# Patient Record
Sex: Female | Born: 1960
Health system: Southern US, Community
[De-identification: ages and names within clinical notes are randomized; demographics above are authoritative.]

## PROBLEM LIST (undated history)

## (undated) DIAGNOSIS — K219 Gastro-esophageal reflux disease without esophagitis: Secondary | ICD-10-CM

## (undated) DIAGNOSIS — E119 Type 2 diabetes mellitus without complications: Secondary | ICD-10-CM

## (undated) DIAGNOSIS — J45909 Unspecified asthma, uncomplicated: Secondary | ICD-10-CM

## (undated) DIAGNOSIS — G510 Bell's palsy: Secondary | ICD-10-CM

## (undated) DIAGNOSIS — M1990 Unspecified osteoarthritis, unspecified site: Secondary | ICD-10-CM

## (undated) DIAGNOSIS — N200 Calculus of kidney: Secondary | ICD-10-CM

## (undated) DIAGNOSIS — I10 Essential (primary) hypertension: Secondary | ICD-10-CM

## (undated) DIAGNOSIS — Z8719 Personal history of other diseases of the digestive system: Secondary | ICD-10-CM

## (undated) HISTORY — DX: Bell's palsy: G51.0

## (undated) HISTORY — DX: Calculus of kidney: N20.0

## (undated) HISTORY — DX: Essential (primary) hypertension: I10

## (undated) HISTORY — DX: Gastro-esophageal reflux disease without esophagitis: K21.9

---
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
## 1983-05-17 HISTORY — PX: OTHER SURGICAL HISTORY: SHX169

## 1998-11-27 ENCOUNTER — Encounter: Admission: RE | Admit: 1998-11-27 | Discharge: 1999-02-25 | Payer: Self-pay | Admitting: Obstetrics & Gynecology

## 1999-01-24 ENCOUNTER — Inpatient Hospital Stay (HOSPITAL_COMMUNITY): Admission: AD | Admit: 1999-01-24 | Discharge: 1999-01-24 | Payer: Self-pay | Admitting: Obstetrics & Gynecology

## 1999-01-29 ENCOUNTER — Inpatient Hospital Stay (HOSPITAL_COMMUNITY): Admission: AD | Admit: 1999-01-29 | Discharge: 1999-01-31 | Payer: Self-pay | Admitting: Obstetrics and Gynecology

## 1999-02-02 ENCOUNTER — Encounter (HOSPITAL_COMMUNITY): Admission: RE | Admit: 1999-02-02 | Discharge: 1999-05-03 | Payer: Self-pay | Admitting: Obstetrics and Gynecology

## 2000-05-16 HISTORY — PX: OTHER SURGICAL HISTORY: SHX169

## 2001-02-07 ENCOUNTER — Other Ambulatory Visit: Admission: RE | Admit: 2001-02-07 | Discharge: 2001-02-07 | Payer: Self-pay | Admitting: Obstetrics and Gynecology

## 2001-02-07 ENCOUNTER — Encounter (INDEPENDENT_AMBULATORY_CARE_PROVIDER_SITE_OTHER): Payer: Self-pay | Admitting: Specialist

## 2004-02-06 ENCOUNTER — Encounter: Admission: RE | Admit: 2004-02-06 | Discharge: 2004-02-06 | Payer: Self-pay | Admitting: Family Medicine

## 2004-04-20 ENCOUNTER — Ambulatory Visit: Payer: Self-pay | Admitting: Family Medicine

## 2004-06-25 ENCOUNTER — Ambulatory Visit: Payer: Self-pay | Admitting: Family Medicine

## 2004-06-27 ENCOUNTER — Encounter: Admission: RE | Admit: 2004-06-27 | Discharge: 2004-06-27 | Payer: Self-pay | Admitting: Family Medicine

## 2004-10-19 ENCOUNTER — Ambulatory Visit: Payer: Self-pay | Admitting: Family Medicine

## 2005-02-21 ENCOUNTER — Ambulatory Visit: Payer: Self-pay | Admitting: Family Medicine

## 2005-02-28 ENCOUNTER — Ambulatory Visit: Payer: Self-pay | Admitting: Family Medicine

## 2005-03-02 ENCOUNTER — Ambulatory Visit: Payer: Self-pay | Admitting: Internal Medicine

## 2005-03-07 ENCOUNTER — Ambulatory Visit: Payer: Self-pay | Admitting: Family Medicine

## 2005-03-11 ENCOUNTER — Ambulatory Visit: Payer: Self-pay | Admitting: Internal Medicine

## 2005-08-09 ENCOUNTER — Ambulatory Visit: Payer: Self-pay | Admitting: Family Medicine

## 2005-08-22 ENCOUNTER — Ambulatory Visit: Payer: Self-pay

## 2005-08-22 ENCOUNTER — Encounter: Payer: Self-pay | Admitting: Internal Medicine

## 2005-08-23 ENCOUNTER — Ambulatory Visit: Payer: Self-pay | Admitting: Family Medicine

## 2006-05-26 ENCOUNTER — Ambulatory Visit: Payer: Self-pay | Admitting: Family Medicine

## 2007-01-26 ENCOUNTER — Encounter: Payer: Self-pay | Admitting: Family Medicine

## 2007-01-29 ENCOUNTER — Encounter (INDEPENDENT_AMBULATORY_CARE_PROVIDER_SITE_OTHER): Payer: Self-pay | Admitting: *Deleted

## 2007-03-14 ENCOUNTER — Ambulatory Visit: Payer: Self-pay | Admitting: Internal Medicine

## 2007-03-14 DIAGNOSIS — I10 Essential (primary) hypertension: Secondary | ICD-10-CM

## 2007-03-26 ENCOUNTER — Ambulatory Visit: Payer: Self-pay | Admitting: Internal Medicine

## 2007-03-26 ENCOUNTER — Telehealth: Payer: Self-pay | Admitting: Internal Medicine

## 2007-03-26 DIAGNOSIS — H6520 Chronic serous otitis media, unspecified ear: Secondary | ICD-10-CM | POA: Insufficient documentation

## 2007-03-27 ENCOUNTER — Encounter: Payer: Self-pay | Admitting: Internal Medicine

## 2007-03-28 ENCOUNTER — Telehealth (INDEPENDENT_AMBULATORY_CARE_PROVIDER_SITE_OTHER): Payer: Self-pay | Admitting: *Deleted

## 2007-12-20 ENCOUNTER — Ambulatory Visit: Payer: Self-pay | Admitting: *Deleted

## 2007-12-25 ENCOUNTER — Telehealth (INDEPENDENT_AMBULATORY_CARE_PROVIDER_SITE_OTHER): Payer: Self-pay | Admitting: *Deleted

## 2008-01-08 ENCOUNTER — Ambulatory Visit (HOSPITAL_BASED_OUTPATIENT_CLINIC_OR_DEPARTMENT_OTHER): Admission: RE | Admit: 2008-01-08 | Discharge: 2008-01-08 | Payer: Self-pay | Admitting: *Deleted

## 2008-01-08 ENCOUNTER — Ambulatory Visit: Payer: Self-pay | Admitting: *Deleted

## 2008-01-30 ENCOUNTER — Ambulatory Visit: Payer: Self-pay | Admitting: *Deleted

## 2008-02-06 ENCOUNTER — Ambulatory Visit: Payer: Self-pay | Admitting: *Deleted

## 2008-03-24 ENCOUNTER — Ambulatory Visit: Payer: Self-pay | Admitting: *Deleted

## 2008-03-24 DIAGNOSIS — R498 Other voice and resonance disorders: Secondary | ICD-10-CM | POA: Insufficient documentation

## 2008-03-24 LAB — CONVERTED CEMR LAB: Rapid Strep: NEGATIVE

## 2008-03-27 ENCOUNTER — Telehealth (INDEPENDENT_AMBULATORY_CARE_PROVIDER_SITE_OTHER): Payer: Self-pay | Admitting: *Deleted

## 2008-04-11 ENCOUNTER — Encounter: Admission: RE | Admit: 2008-04-11 | Discharge: 2008-04-11 | Payer: Self-pay | Admitting: Otolaryngology

## 2008-06-11 ENCOUNTER — Telehealth (INDEPENDENT_AMBULATORY_CARE_PROVIDER_SITE_OTHER): Payer: Self-pay | Admitting: *Deleted

## 2008-09-08 ENCOUNTER — Encounter: Admission: RE | Admit: 2008-09-08 | Discharge: 2008-09-08 | Payer: Self-pay | Admitting: Otolaryngology

## 2008-10-09 ENCOUNTER — Ambulatory Visit: Payer: Self-pay | Admitting: Family Medicine

## 2008-10-09 DIAGNOSIS — M719 Bursopathy, unspecified: Secondary | ICD-10-CM

## 2008-10-09 DIAGNOSIS — M67919 Unspecified disorder of synovium and tendon, unspecified shoulder: Secondary | ICD-10-CM | POA: Insufficient documentation

## 2008-10-09 DIAGNOSIS — L851 Acquired keratosis [keratoderma] palmaris et plantaris: Secondary | ICD-10-CM

## 2008-10-28 ENCOUNTER — Ambulatory Visit (HOSPITAL_BASED_OUTPATIENT_CLINIC_OR_DEPARTMENT_OTHER): Admission: RE | Admit: 2008-10-28 | Discharge: 2008-10-28 | Payer: Self-pay | Admitting: Family Medicine

## 2008-10-28 ENCOUNTER — Ambulatory Visit: Payer: Self-pay | Admitting: Diagnostic Radiology

## 2008-10-28 ENCOUNTER — Ambulatory Visit: Payer: Self-pay | Admitting: Family Medicine

## 2008-11-05 ENCOUNTER — Telehealth: Payer: Self-pay | Admitting: Family Medicine

## 2008-11-24 ENCOUNTER — Encounter: Payer: Self-pay | Admitting: Family Medicine

## 2008-11-27 ENCOUNTER — Telehealth: Payer: Self-pay | Admitting: Family Medicine

## 2009-02-25 ENCOUNTER — Ambulatory Visit: Payer: Self-pay | Admitting: Internal Medicine

## 2009-03-20 ENCOUNTER — Ambulatory Visit: Payer: Self-pay | Admitting: Family Medicine

## 2009-03-23 ENCOUNTER — Telehealth: Payer: Self-pay | Admitting: Family Medicine

## 2009-04-06 ENCOUNTER — Ambulatory Visit: Payer: Self-pay | Admitting: Family Medicine

## 2009-04-24 ENCOUNTER — Telehealth (INDEPENDENT_AMBULATORY_CARE_PROVIDER_SITE_OTHER): Payer: Self-pay | Admitting: *Deleted

## 2009-06-23 ENCOUNTER — Telehealth: Payer: Self-pay | Admitting: Family Medicine

## 2009-11-22 ENCOUNTER — Ambulatory Visit: Payer: Self-pay | Admitting: Diagnostic Radiology

## 2009-11-22 ENCOUNTER — Emergency Department (HOSPITAL_BASED_OUTPATIENT_CLINIC_OR_DEPARTMENT_OTHER)
Admission: EM | Admit: 2009-11-22 | Discharge: 2009-11-22 | Payer: Self-pay | Source: Home / Self Care | Admitting: Emergency Medicine

## 2009-11-23 ENCOUNTER — Encounter: Payer: Self-pay | Admitting: Family Medicine

## 2009-11-26 ENCOUNTER — Ambulatory Visit (HOSPITAL_COMMUNITY)
Admission: RE | Admit: 2009-11-26 | Discharge: 2009-11-26 | Payer: Self-pay | Source: Home / Self Care | Admitting: Urology

## 2009-12-16 ENCOUNTER — Encounter: Payer: Self-pay | Admitting: Family Medicine

## 2010-01-13 ENCOUNTER — Ambulatory Visit: Payer: Self-pay | Admitting: Family Medicine

## 2010-01-13 DIAGNOSIS — R635 Abnormal weight gain: Secondary | ICD-10-CM | POA: Insufficient documentation

## 2010-01-19 ENCOUNTER — Encounter: Payer: Self-pay | Admitting: Family Medicine

## 2010-01-19 LAB — CONVERTED CEMR LAB
ALT: 53 units/L
Calcium: 9.1 mg/dL
Chloride: 102 meq/L
Creatinine, Ser: 0.84 mg/dL
HDL: 44 mg/dL
Sodium: 141 meq/L
TSH: 2.3 microintl units/mL
Total Protein: 7.4 g/dL
Triglycerides: 187 mg/dL

## 2010-01-20 ENCOUNTER — Encounter: Payer: Self-pay | Admitting: Family Medicine

## 2010-02-03 ENCOUNTER — Ambulatory Visit: Payer: Self-pay | Admitting: Family Medicine

## 2010-02-24 ENCOUNTER — Ambulatory Visit: Payer: Self-pay | Admitting: Family Medicine

## 2010-05-07 ENCOUNTER — Ambulatory Visit: Payer: Self-pay | Admitting: Family Medicine

## 2010-05-13 ENCOUNTER — Ambulatory Visit: Payer: Self-pay | Admitting: Family Medicine

## 2010-05-20 ENCOUNTER — Telehealth (INDEPENDENT_AMBULATORY_CARE_PROVIDER_SITE_OTHER): Payer: Self-pay | Admitting: *Deleted

## 2010-05-21 ENCOUNTER — Encounter: Payer: Self-pay | Admitting: Family Medicine

## 2010-05-26 ENCOUNTER — Telehealth (INDEPENDENT_AMBULATORY_CARE_PROVIDER_SITE_OTHER): Payer: Self-pay | Admitting: *Deleted

## 2010-05-27 ENCOUNTER — Ambulatory Visit
Admission: RE | Admit: 2010-05-27 | Discharge: 2010-05-27 | Payer: Self-pay | Source: Home / Self Care | Attending: Family Medicine | Admitting: Family Medicine

## 2010-05-27 ENCOUNTER — Encounter: Payer: Self-pay | Admitting: Family Medicine

## 2010-06-03 ENCOUNTER — Telehealth: Payer: Self-pay | Admitting: Critical Care Medicine

## 2010-06-03 ENCOUNTER — Ambulatory Visit
Admission: RE | Admit: 2010-06-03 | Discharge: 2010-06-03 | Payer: Self-pay | Source: Home / Self Care | Attending: Critical Care Medicine | Admitting: Critical Care Medicine

## 2010-06-03 ENCOUNTER — Ambulatory Visit (HOSPITAL_BASED_OUTPATIENT_CLINIC_OR_DEPARTMENT_OTHER)
Admission: RE | Admit: 2010-06-03 | Discharge: 2010-06-03 | Payer: Self-pay | Source: Home / Self Care | Attending: Critical Care Medicine | Admitting: Critical Care Medicine

## 2010-06-03 ENCOUNTER — Telehealth (INDEPENDENT_AMBULATORY_CARE_PROVIDER_SITE_OTHER): Payer: Self-pay | Admitting: *Deleted

## 2010-06-03 ENCOUNTER — Encounter: Payer: Self-pay | Admitting: Critical Care Medicine

## 2010-06-04 ENCOUNTER — Telehealth (INDEPENDENT_AMBULATORY_CARE_PROVIDER_SITE_OTHER): Payer: Self-pay | Admitting: *Deleted

## 2010-06-05 ENCOUNTER — Encounter: Payer: Self-pay | Admitting: Critical Care Medicine

## 2010-06-06 ENCOUNTER — Encounter: Payer: Self-pay | Admitting: Family Medicine

## 2010-06-15 NOTE — Progress Notes (Signed)
Summary: Requests Tamiflu  Phone Note Call from Patient   Caller: Patient Summary of Call: Pt LMOM stating son was Dx w/ flu and she now has body aches and thinks she is getting the flu. Pt states son's doctor recommended Pt get Rx for Tamiflu. Please advise.  Initial call taken by: Payton Spark CMA,  June 23, 2009 4:50 PM    New/Updated Medications: TAMIFLU 75 MG CAPS (OSELTAMIVIR PHOSPHATE) 1 tab by mouth two times a day x 5 days Prescriptions: TAMIFLU 75 MG CAPS (OSELTAMIVIR PHOSPHATE) 1 tab by mouth two times a day x 5 days  #10 tabs x 0   Entered and Authorized by:   Seymour Bars DO   Signed by:   Seymour Bars DO on 06/23/2009   Method used:   [facility]        [facility] [phone][facility] (retail)       [address]       [place], [place]  [address]       Ph: [phone]       Fax: [phone]   RxID:   [id number]   Appended Document: Requests Tamiflu Pt aware

## 2010-06-15 NOTE — Assessment & Plan Note (Signed)
Summary: flu shot - jr  Nurse Visit   Allergies: No Known Drug Allergies  Immunizations Administered:  Influenza Vaccine # 1:    Vaccine Type: Fluvax 3+    Site: left deltoid    Mfr: GlaxoSmithKline    Dose: 0.5 ml    Route: IM    Given by: Sue Lush McCrimmon CMA, (AAMA)    Exp. Date: 11/13/2010    Lot #: [id number]    VIS given: 12/08/09 version given February 24, 2010.  Flu Vaccine Consent Questions:    Do you have a history of severe allergic reactions to this vaccine? no    Any prior history of allergic reactions to egg and/or gelatin? no    Do you have a sensitivity to the preservative Thimersol? no    Do you have a past history of Guillan-Barre Syndrome? no    Do you currently have an acute febrile illness? no    Have you ever had a severe reaction to latex? no    Vaccine information given and explained to patient? no    Are you currently pregnant? no  Orders Added: 1)  Flu Vaccine 75yrs + [90658] 2)  Admin 1st Vaccine [02725]

## 2010-06-15 NOTE — Letter (Signed)
Summary: [facility]  [facility]   Imported By: Lanelle Bal 12/25/2009 10:57:34  _____________________________________________________________________  External Attachment:    Type:   Image     Comment:   External Document

## 2010-06-15 NOTE — Assessment & Plan Note (Signed)
Summary: BP Check-jr  Nurse Visit   Vital Signs:  Patient profile:   50 year old female Menstrual status:  regular O2 Sat:      98 % on Room air Pulse rate:   93 / minute BP sitting:   130 / 82  (left arm) Cuff size:   large  Vitals Entered By: Payton Spark CMA (February 03, 2010 3:06 PM)  O2 Flow:  Room air CC: F/U HTN. C/O increased HAs since starting amlodipine    Allergies: No Known Drug Allergies  Orders Added: 1)  Est. Patient Level I [[address]] Prescriptions: AMLODIPINE BESY-BENAZEPRIL HCL 5-40 MG CAPS (AMLODIPINE BESY-BENAZEPRIL HCL) 1 tab by mouth daily  #90 x 1   Entered and Authorized by:   Seymour Bars DO   Signed by:   Seymour Bars DO on 02/03/2010   Method used:   Printed then faxed to ...       [facility] [phone]* (retail)       [address]       [place], [place]  [address]       Ph: [phone]       Fax: [phone]   RxID:   [id number] AMLODIPINE BESY-BENAZEPRIL HCL 5-40 MG CAPS (AMLODIPINE BESY-BENAZEPRIL HCL) 1 tab by mouth daily  #30 x 0   Entered and Authorized by:   Seymour Bars DO   Signed by:   Seymour Bars DO on 02/03/2010   Method used:   Electronically to        [facility] [phone]* (retail)       [address]       [place], [place]  [address]       Ph: [phone]       Fax: [phone]   RxID:   [id number]     Impression & Recommendations:  Problem # 1:  HYPERTENSION (ICD-401.9) BP looks great.  HAs not likely to be from meds.   Her updated medication list for this problem includes:    Amlodipine Besy-benazepril Hcl 5-40 Mg Caps (Amlodipine besy-benazepril hcl) .[name]... 1 tab by mouth daily  Complete Medication List: 1)  Amlodipine Besy-benazepril Hcl 5-40 Mg Caps (Amlodipine besy-benazepril hcl) .[name].. 1 tab by mouth daily 2)  Ventolin Hfa 108 (90 Base) Mcg/act Aers (Albuterol sulfate) .[name].. 1-2 puffs every 4-6 hours as needed shortness of breath 3)  Nexium 40  Mg Cpdr (Esomeprazole magnesium) .... Take 1 tab by mouth two times a day 4)  Flonase 50 Mcg/act Susp (Fluticasone propionate) .... 2 sprays / nostril daily 5)  Amoxicillin 875 Mg Tabs (Amoxicillin) .[name].. 1 tab by mouth q 12 hrs x 7 days   Appended Document: BP Check-jr Pt aware of the above

## 2010-06-15 NOTE — Letter (Signed)
Summary: [facility]  [facility]   Imported By: Lanelle Bal 12/01/2009 14:47:10  _____________________________________________________________________  External Attachment:    Type:   Image     Comment:   External Document

## 2010-06-15 NOTE — Assessment & Plan Note (Signed)
Summary: HTN/ sinusitis   Vital Signs:  Patient profile:   50 year old female Menstrual status:  regular Height:      62 inches Weight:      174 pounds BMI:     31.94 O2 Sat:      98 % on Room air Pulse rate:   96 / minute BP sitting:   140 / 76  (left arm) Cuff size:   large  Vitals Entered By: Payton Spark CMA (January 13, 2010 8:42 AM)  O2 Flow:  Room air CC: F/U HTN. Med refills   Primary Care Provider:  Seymour Bars DO  CC:  F/U HTN. Med refills.  History of Present Illness: 50 yo WF presents for f/u HTN.  She is due for fasting labs.  She is due for med RFs.  She feels well other than head congestion, HA and postnasal drip for the past wk w/o sore throat, cough or F/C.    She is taking Flonase daily.  Not on an anti histamine.  Hx of seasonal allergies.    Denies CP or DOE.  She is interested in Product/process development scientist b/c the diuretic is causing dry mouth, frequent urination and she recently had a kidney stone.  She drinks little water thru the day.    Current Medications (verified): 1)  Tribenzor 20-5-12.5 Mg Tabs (Olmesartan-Amlodipine-Hctz) .[name].. 1 Tab By Mouth Daily 2)  Ventolin Hfa 108 (90 Base) Mcg/act  Aers (Albuterol Sulfate) .[name].. 1-2 Puffs Every 4-6 Hours As Needed Shortness of Breath 3)  Nexium 40 Mg Cpdr (Esomeprazole Magnesium) .... Take 1 Tab By Mouth Two Times A Day 4)  Flonase 50 Mcg/act Susp (Fluticasone Propionate) .... 2 Sprays / Nostril Daily  Allergies (verified): No Known Drug Allergies  Past History:  Past Medical History: Hypertension endometriosis h/o histoplasmosis Reactive Airway Disease Allergic rhinitis Kidney Stones GI  Past Surgical History: Reviewed history from 12/20/2007 and no changes required. tonsils/adenoids - 1967 uterine lining ablataion - 2002 laparatomy - 1985  Social History: Reviewed history from 12/20/2007 and no changes required. 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   Head:  normocephalic and atraumatic.  maxillary and frontal sinuses TTP with mild maxillary swelling Eyes:  conjunctiva clear; wearing glasses Ears:  EACs patent; TMs translucent and gray with good cone of light and bony landmarks.  Nose:  boggy turbinates; no rhinorrhea Mouth:  o/p pink and moist Neck:  no masses.   Lungs:  Normal respiratory effort, chest expands symmetrically. Lungs are clear to auscultation, no crackles or wheezes. Heart:  Normal rate and regular rhythm. S1 and S2 normal without gallop, murmur, click, rub or other extra sounds. Skin:  color normal.   Cervical Nodes:  No lymphadenopathy noted   Impression & Recommendations:  Problem # 1:  HYPERTENSION (ICD-401.9) Changed her Tribenzor to Lotrel due to HCTZ 'drying' her out and her concern that this lead to her kidney stone.  Will see how she's doing on Lotrel at a nurse visit in 3 wks.   Her updated medication list for this problem includes:    Amlodipine Besy-benazepril Hcl 5-40 Mg Caps (Amlodipine besy-benazepril hcl) .[name]... 1 tab by mouth daily  BP today: 140/76 Prior BP: 123/72 (04/06/2009)  Problem # 2:  SINUSITIS (ICD-473.9) Secondary to seasonal allergy flare.  Will treat allergies + use Amoxicillin x 7 days with as needed use of  Ibuprofen for HA pain. Her updated medication list for this problem includes:    Flonase 50 Mcg/act Susp (Fluticasone propionate) .[name]... 2 sprays / nostril daily    Amoxicillin 875 Mg Tabs (Amoxicillin) .[name]... 1 tab by mouth q 12 hrs x 7 days  Problem # 3:  ALLERGIC RHINITIS (ICD-477.9) Treat with Flonase + Allegra + ventolin as needed thru allergy season Her updated medication list for this problem includes:    Flonase 50 Mcg/act Susp (Fluticasone propionate) .[name]... 2 sprays / nostril daily  Problem # 4:  WEIGHT GAIN (ICD-783.1) 10# wt gain since last visit.  Likely due to poor diet and  lack of exercise.  Will add a TSH to her upcoming fasting labs. Orders: T-TSH ([id number])  Complete Medication List: 1)  Amlodipine Besy-benazepril Hcl 5-40 Mg Caps (Amlodipine besy-benazepril hcl) .[name].. 1 tab by mouth daily 2)  Ventolin Hfa 108 (90 Base) Mcg/act Aers (Albuterol sulfate) .[name].. 1-2 puffs every 4-6 hours as needed shortness of breath 3)  Nexium 40 Mg Cpdr (Esomeprazole magnesium) .... Take 1 tab by mouth two times a day 4)  Flonase 50 Mcg/act Susp (Fluticasone propionate) .... 2 sprays / nostril daily 5)  Amoxicillin 875 Mg Tabs (Amoxicillin) .[name].. 1 tab by mouth q 12 hrs x 7 days  Other Orders: T-Comprehensive Metabolic Panel [id number]) T-Lipid Profile ([id number])  Patient Instructions: 1)  For allergy season:  take Allegra (without D) daily + Flonase nasal spray daily. 2)  Take 7 days of Amoxicillin for sinusitis. 3)  Update fasting labs. 4)  Will call you w/ results. 5)  Return for a nurse visit BP check in 3 wks. Prescriptions: AMOXICILLIN 875 MG TABS (AMOXICILLIN) 1 tab by mouth q 12 hrs x 7 days  #14 x 0   Entered and Authorized by:   Seymour Bars DO   Signed by:   Seymour Bars DO on 01/13/2010   Method used:   Electronically to        [facility] [phone]* (retail)       [address]       [place], [place]  [address]       Ph: [phone]       Fax: [phone]   RxID:   [id number] NEXIUM 40 MG CPDR (ESOMEPRAZOLE MAGNESIUM) Take 1 tab by mouth two times a day  #180 x 3   Entered and Authorized by:   Seymour Bars DO   Signed by:   Seymour Bars DO on 01/13/2010   Method used:   Print then Give to Patient   RxID:   [id number] FLONASE 50 MCG/ACT SUSP (FLUTICASONE PROPIONATE) 2 sprays / nostril daily  #3 bottles x 3   Entered and Authorized by:   Seymour Bars DO   Signed by:   Seymour Bars DO on 01/13/2010   Method used:   Print then Give to Patient   RxID:   [id number] VENTOLIN HFA 108 (90 BASE) MCG/ACT  AERS  (ALBUTEROL SULFATE) 1-2 puffs every 4-6 hours as needed shortness of breath  #3 x 1   Entered and Authorized by:   Seymour Bars DO   Signed by:   Seymour Bars DO on 01/13/2010   Method used:   Print then Give to Patient   RxID:   [id number] AMLODIPINE BESY-BENAZEPRIL HCL 5-40 MG CAPS (AMLODIPINE BESY-BENAZEPRIL HCL) 1 tab by mouth daily  #30 x 0   Entered and Authorized by:   Seymour Bars DO  Signed by:   Seymour Bars DO on 01/13/2010   Method used:   Electronically to        [facility] [phone]* (retail)       [address]       [place], [place]  [address]       Ph: [phone]       Fax: [phone]   RxID:   [id number]

## 2010-06-15 NOTE — Miscellaneous (Signed)
Summary: labs  Clinical Lists Changes  Observations: Added new observation of TSH: 2.3 microintl units/mL (01/19/2010 11:55) Added new observation of LDL: 128 mg/dL (52/84/1324 40:10) Added new observation of HDL: 44 mg/dL (27/25/3664 40:34) Added new observation of TRIGLYC TOT: 187 mg/dL (74/25/9563 87:56) Added new observation of CHOLESTEROL: 209 mg/dL (43/32/9518 84:16) Added new observation of CALCIUM: 9.1 mg/dL (60/63/0160 10:93) Added new observation of ALBUMIN: 4.6 g/dL (23/55/7322 02:54) Added new observation of PROTEIN, TOT: 7.4 g/dL (27/10/2374 28:31) Added new observation of SGPT (ALT): 53 units/L (01/19/2010 11:55) Added new observation of SGOT (AST): 33 units/L (01/19/2010 11:55) Added new observation of ALK PHOS: 87 units/L (01/19/2010 11:55) Added new observation of BILI TOTAL: 1.2 mg/dL (51/76/1607 37:10) Added new observation of CREATININE: 0.84 mg/dL (62/69/4854 62:70) Added new observation of BUN: 12 mg/dL (35/00/9381 82:99) Added new observation of BG RANDOM: 92 mg/dL (37/16/9678 93:81) Added new observation of CO2 PLSM/SER: 21 meq/L (01/19/2010 11:55) Added new observation of CL SERUM: 102 meq/L (01/19/2010 11:55) Added new observation of K SERUM: 3.7 meq/L (01/19/2010 11:55) Added new observation of NA: 141 meq/L (01/19/2010 11:55)  Appended Document: labs Pls let pt know that her labs show a normal fasting sugar and kidney function with slight elevation in one of her liver enzymes.  Cholesterol is a little high with a total of 209, TGs 187, LDL 128 (<100 is goal).  Thyroid function in normal range.  I will look at her meds and see if we need to make any changes.  Seymour Bars, D.O.  Appended Document: labs Since HTN is her only risk factor for CVD, will do diet, exercise and fish oil and keep her LDL goal at <130.  Seymour Bars, D.O.  Appended Document: labs Pt aware of the above

## 2010-06-16 ENCOUNTER — Encounter: Payer: Self-pay | Admitting: Critical Care Medicine

## 2010-06-17 NOTE — Miscellaneous (Signed)
Summary: Order for Oximetry Testing/[facility]  Order for Oximetry Testing/[facility]   Imported By: Lanelle Bal 06/08/2010 10:21:43  _____________________________________________________________________  External Attachment:    Type:   Image     Comment:   External Document

## 2010-06-17 NOTE — Assessment & Plan Note (Signed)
Summary: asthma flare   Vital Signs:  Patient profile:   50 year old female Menstrual status:  regular Height:      62 inches Weight:      171 pounds O2 Sat:      97 % on Room air Pulse rate:   89 / minute BP sitting:   121 / 82  (right arm) Cuff size:   regular  Vitals Entered By: Avon Gully CMA, Duncan Dull) (May 07, 2010 11:37 AM)  O2 Flow:  Room air  Serial Vital Signs/Assessments:  Comments: 11:39 AM post medication peak flow 300, 250,300 By: Avon Gully CMA, (AAMA)  11:40 AM pt is in the gren zone By: Avon Gully CMA, (AAMA)   CC: cough,congestion x 2 days , worse at night,hard to breath   Primary Care Provider:  Seymour Bars DO  CC:  cough, congestion x 2 days , worse at night, and hard to breath.  History of Present Illness: 50 yo WF with a hx of asthma presents for cough and wheezing with chest tightness x 2 days.  She went to UC in [place] in October and had to have a steroid injection for her asthma.  She has an Albuterol Inhaler which she is using q 2 hrs which is helping some.  Her cough is mostly dry.  No fever.  She has a sore throat and runny nose.    She has not been on a controller agent this fall and her asthma has been worse.  Not taking anything OTC. The cough is keeping her up at night.  Current Medications (verified): 1)  Amlodipine Besy-Benazepril Hcl 5-40 Mg Caps (Amlodipine Besy-Benazepril Hcl) .[name].. 1 Tab By Mouth Daily 2)  Ventolin Hfa 108 (90 Base) Mcg/act  Aers (Albuterol Sulfate) .[name].. 1-2 Puffs Every 4-6 Hours As Needed Shortness of Breath 3)  Nexium 40 Mg Cpdr (Esomeprazole Magnesium) .... Take 1 Tab By Mouth Two Times A Day 4)  Flonase 50 Mcg/act Susp (Fluticasone Propionate) .... 2 Sprays / Nostril Daily 5)  Estradiol 1 Mg Tabs (Estradiol) .... Take One Tablet By Mouth Once A Day  Allergies (verified): No Known Drug Allergies  Comments:  Nurse/Medical Assistant: The patient's medications and allergies were  reviewed with the patient and were updated in the Medication and Allergy Lists. Avon Gully CMA, Duncan Dull) (May 07, 2010 11:39 AM)  Past History:  Past Medical History: Reviewed history from 01/13/2010 and no changes required. Hypertension endometriosis h/o histoplasmosis Reactive Airway Disease Allergic rhinitis Kidney Stones GI  Past Surgical History: Reviewed history from 12/20/2007 and no changes required. tonsils/adenoids - 1967 uterine lining ablataion - 2002 laparatomy - 1985  Social History: Reviewed history from 12/20/2007 and no changes required. 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   Head:  normocephalic and atraumatic.   Eyes:  conjunctiva clear Nose:  no nasal discharge.  no nasal discharge.   Mouth:  good dentition and pharynx pink and moist.   Neck:  no masses.   Lungs:  Normal respiratory effort, chest expands symmetrically. Lungs are clear to auscultation, no crackles or wheezes. Heart:  Normal rate and regular rhythm. S1 and S2 normal without gallop, murmur, click, rub or other extra sounds. Pulses:  2+ radial pulses Skin:  color normal.   Cervical Nodes:  No lymphadenopathy noted Psych:  good eye contact, not anxious appearing, and not depressed appearing.  Impression & Recommendations:  Problem # 1:  ASTHMA, WITH ACUTE EXACERBATION (ICD-493.92)  Secondary to URI. Treat with Depo Medrol 80 mg IM x 1.  PFs in yellow zone. Add Symbicort 2 x a day as a controller agent until better controlled. RX for Nebulizer with albuterol set up thru [facility]. If worsens, will need to go to ED or UC over w/e, o/w plain to see her back in 6 days for f/u. Treat cold/ cough with Hycodan. Her updated medication list for this problem includes:    Ventolin Hfa 108 (90 Base) Mcg/act Aers (Albuterol sulfate) .[name]... 1-2 puffs every 4-6 hours  as needed shortness of breath    Symbicort 80-4.5 Mcg/act Aero (Budesonide-formoterol fumarate) .[name]... 2 puffs 2 x a day (rinse mouth after each use)    Albuterol Sulfate (5 Mg/ml) 0.5% Nebu (Albuterol sulfate) .Marland KitchenMarland KitchenMarland KitchenMarland Kitchen 5 mg neb q 4-6 hrs as needed wheezing  Orders: Depo- Medrol 80mg  (J1040) Admin of Therapeutic Inj  intramuscular or subcutaneous ([address])  Complete Medication List: 1)  Amlodipine Besy-benazepril Hcl 5-40 Mg Caps (Amlodipine besy-benazepril hcl) .[name].. 1 tab by mouth daily 2)  Ventolin Hfa 108 (90 Base) Mcg/act Aers (Albuterol sulfate) .[name].. 1-2 puffs every 4-6 hours as needed shortness of breath 3)  Nexium 40 Mg Cpdr (Esomeprazole magnesium) .... Take 1 tab by mouth two times a day 4)  Flonase 50 Mcg/act Susp (Fluticasone propionate) .... 2 sprays / nostril daily 5)  Estradiol 1 Mg Tabs (Estradiol) .... Take one tablet by mouth once a day 6)  Symbicort 80-4.5 Mcg/act Aero (Budesonide-formoterol fumarate) .... 2 puffs 2 x a day (rinse mouth after each use) 7)  Nebulizer Macine  .... Dx: asthma 8)  Albuterol Sulfate (5 Mg/ml) 0.5% Nebu (Albuterol sulfate) .... 5 mg neb q 4-6 hrs as needed wheezing 9)  Hydrocodone-homatropine 5-1.5 Mg Tabs (Hydrocodone-homatropine) .... 5 ml q 6 hrs as needed cough  Patient Instructions: 1)  For asthma flare: 2)  Steroid injection given today. 3)  Start Symbicort 2 puffs 2 x a day (morning and night) -- RINSE MOUT OUT AFTER EACH USE. 4)  Use either ProAir HFA OR Albterol Nebulizer machine every 2-6 hrs as needed for shortness of breathe/ wheezing. 5)  use RX Hycodan for cough-- this will make you sleepy. 6)  REturn for follow up next Thursday. 7)  If asthma has not improved over the weekend, please go to urgent care. Prescriptions: HYDROCODONE-HOMATROPINE 5-1.5 MG TABS (HYDROCODONE-HOMATROPINE) 5 ml q 6 hrs as needed cough  #200 ml x 0   Entered and Authorized by:   Seymour Bars DO   Signed by:   Seymour Bars DO on 05/07/2010   Method used:    Printed then faxed to ...       [facility] [phone]* (retail)       [address]       [place], [place]  [address]       Ph: [phone]       Fax: [phone]   RxID:   [id number] ALBUTEROL SULFATE (5 MG/ML) 0.5% NEBU (ALBUTEROL SULFATE) 5 mg Neb q 4-6 hrs as needed wheezing  #1 box x 2   Entered and Authorized by:   Seymour Bars DO   Signed by:   Seymour Bars DO on 05/07/2010   Method used:   Electronically to        [facility] [phone]* (retail)       [phone]  [address]       [place], [place]  [address]       Ph: [phone]       Fax: [phone]   RxID:   [id number] NEBULIZER MACINE dx: asthma  #1 x 0   Entered and Authorized by:   Seymour Bars DO   Signed by:   Seymour Bars DO on 05/07/2010   Method used:   Printed then faxed to ...       [facility] [phone]* (retail)       [address]       [place], [place]  [address]       Ph: [phone]       Fax: [phone]   RxID:   [id number]    Medication Administration  Injection # 1:    Medication: Depo- Medrol 80mg     Diagnosis: REACTIVE AIRWAY DISEASE (ICD-493.90)    Route: IM    Site: RUOQ gluteus    Exp Date: 10/15/2010    Lot #: [id number]    Mfr: Pharmacia    Patient tolerated injection without complications    Given by: Kathlene November LPN (May 07, 2010 12:12 PM)  Orders Added: 1)  Depo- Medrol 80mg  [J1040] 2)  Admin of Therapeutic Inj  intramuscular or subcutaneous [96372] 3)  Est. Patient Level III [[address]63]

## 2010-06-17 NOTE — Miscellaneous (Signed)
Summary: Order for Oxygen/[facility]  Order for Oxygen/[facility]   Imported By: Lanelle Bal 06/04/2010 14:02:18  _____________________________________________________________________  External Attachment:    Type:   Image     Comment:   External Document

## 2010-06-17 NOTE — Progress Notes (Signed)
Summary: questions about lab orders-  Phone Note Call from Patient Call back at Home Phone [phone]   Caller: Patient Call For: wright Summary of Call: Pt states she went to get her labs done at [facility] today and was told the second and third orders were for pap smears wants to know why PW ordered pap smears pls advise. Initial call taken by: Darletta Moll,  June 04, 2010 12:45 PM  Follow-up for Phone Call        [facility]  Vernie Murders  June 04, 2010 2:49 PM patient returned a call from [place] she had lab order form for [facility] but her spouse works at [employer] so she went to [facility] and they called [facility] about the codes and were told that two of the codes were for Pap smears. Pt can be reached at [phone]. Vedia Coffer  June 04, 2010 3:44 PM    Additional Follow-up for Phone Call Additional follow up Details #1::        Called [facility] to find out what codes they are using and they were not able to find the Fungal panel. Stated the Pap smear that was on file had been paid and ordered by Dr. Lloyd Huger. Called pt to get more information on same her phone dropped the call, I called back and lmomtcb. Zackery Barefoot CMA  June 07, 2010 4:38 PM   Returning call.Darletta Moll  June 08, 2010 4:37 PM     Additional Follow-up for Phone Call Additional follow up Details #2::    ATC x1.  No answer and unable to leave a msg. WCB later.Michel Bickers CMA  June 09, 2010 8:50 AM  Spoke with pt and advised of the above recs per JR.  Pt verbalized understanding.  I advised that we still need to get her labs ordered.  She requested that I fax orders over to her at her work and she will take these back to [facility].  I faxed the orders to her at [phone]. Follow-up by: Vernie Murders,  June 09, 2010 2:07 PM

## 2010-06-17 NOTE — Assessment & Plan Note (Signed)
Summary: asthma   Vital Signs:  Patient profile:   50 year old female Menstrual status:  regular Height:      62 inches Weight:      170 pounds BMI:     31.21 O2 Sat:      96 % on Room air Pulse rate:   100 / minute BP sitting:   139 / 86  (left arm) Cuff size:   regular  Vitals Entered By: Payton Spark CMA (May 27, 2010 11:29 AM)  O2 Flow:  Room air CC: F/U asthma. Discuss overnight O2.   Primary Care Provider:  Seymour Bars DO  CC:  F/U asthma. Discuss overnight O2.[name]  History of Present Illness: 50 yo presents for f/u asthma exacerbation.    She has had a sore throat x 1 day.  She is on Symbicort two times a day and is down to as needed on her Xopenex nebs.  she is remembering to rinse her mouth after using Symbicort.  + sick contacts at work.  she feels like her breathing is much imprioved from last visit.  She is not SOB or wheezy and no longer has dry cough.  She did not have to fill the RX for oral prednisone, rather, the steroid injection seemed to work well.    She has never had PFTs.  She does not smoke. She had a normal CXR in 2009 and reports a distant hx of histoplasmosis.    Current Medications (verified): 1)  Amlodipine Besy-Benazepril Hcl 5-40 Mg Caps (Amlodipine Besy-Benazepril Hcl) .[name].. 1 Tab By Mouth Daily 2)  Ventolin Hfa 108 (90 Base) Mcg/act  Aers (Albuterol Sulfate) .[name].. 1-2 Puffs Every 4-6 Hours As Needed Shortness of Breath 3)  Nexium 40 Mg Cpdr (Esomeprazole Magnesium) .... Take 1 Tab By Mouth Two Times A Day 4)  Flonase 50 Mcg/act Susp (Fluticasone Propionate) .... 2 Sprays / Nostril Daily 5)  Estradiol 1 Mg Tabs (Estradiol) .... Take One Tablet By Mouth Once A Day 6)  Symbicort 80-4.5 Mcg/act Aero (Budesonide-Formoterol Fumarate) .... 2 Puffs 2 X A Day (Rinse Mouth After Each Use) 7)  Nebulizer Macine .... Dx: Asthma 8)  Xopenex 1.25 Mg/75ml Nebu (Levalbuterol Hcl) .... 3 Ml Nebs Q 6 Hrs Prn 9)  Claritin 10 Mg Tabs (Loratadine) .... Take 1  Tab By Mouth Once Daily  Allergies (verified): No Known Drug Allergies  Past History:  Past Medical History: Reviewed history from 01/13/2010 and no changes required. Hypertension endometriosis h/o histoplasmosis Reactive Airway Disease Allergic rhinitis Kidney Stones GI  Past Surgical History: Reviewed history from 12/20/2007 and no changes required. 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   Head:  normocephalic and atraumatic.   Eyes:  conjunctiva clear Nose:  no nasal discharge.   Mouth:  pharynx pink and moist.   Neck:  no masses.   Lungs:  Normal respiratory effort, chest expands symmetrically. Lungs are clear to auscultation, no crackles or wheezes.  + forced exp wheeze Heart:  Normal rate and regular rhythm. S1 and S2 normal without gallop, murmur, click, rub or other extra sounds. Skin:  color normal.   Cervical Nodes:  No lymphadenopathy noted Psych:  flat affect.     Impression & Recommendations:  Problem # 1:  ASTHMA, UNSPECIFIED, UNSPECIFIED  STATUS (ICD-493.90) Assessment Improved Asthma flare improved with Steroid injection and she has remained stable with addition of symbicort.  She has not had spirometry which I will set up thru Dr Delford Field since she is a bit defensive.  She had a normal CXR in 09 and does not smoke.  I am going to keep her on Symbicort with as needed use of Xopenex nebs as needed.   The following medications were removed from the medication list:    Prednisone 20 Mg Tabs (Prednisone) .Marland KitchenMarland KitchenMarland KitchenMarland Kitchen 3 tabs by mouth once daily x 2 days then 2 tabs by mouth once daily x 2 days then 1 tab by mouth daily x 2 days then 1/2 tab by mouth daily x 2 days Her updated medication list for this problem  includes:    Ventolin Hfa 108 (90 Base) Mcg/act Aers (Albuterol sulfate) .[name]... 1-2 puffs every 4-6 hours as needed shortness of breath    Symbicort 80-4.5 Mcg/act Aero (Budesonide-formoterol fumarate) .[name]... 2 puffs 2 x a day (rinse mouth after each use)    Xopenex 1.25 Mg/3ml Nebu (Levalbuterol hcl) .Marland KitchenMarland KitchenMarland KitchenMarland Kitchen 3 ml nebs q 6 hrs prn  Orders: Pulmonary Referral (Pulmonary)  Problem # 2:  HYPOXEMIA (ICD-799.02) [facility] did an overnight oximetry when they delivered her neb machine (? not sure why).  She is not a smoker.  She is not having any CP or DOE today and has a normal lung exam.   When questioned, she does snore, has had wt gain and elevated BPs suggesting possible OSA.  She declined a sleep study at this time b/c she thinks that she tore off the monitor at night.  Problem # 3:  PHARYNGITIS (ICD-462)  Sore throat x 1 day, likely viral.  Strep test neg todaty. Supportive care measures discussed.  No sign of thrush.  Orders: Rapid Strep (16109)  Complete Medication List: 1)  Amlodipine Besy-benazepril Hcl 5-40 Mg Caps (Amlodipine besy-benazepril hcl) .[name].. 1 tab by mouth daily 2)  Ventolin Hfa 108 (90 Base) Mcg/act Aers (Albuterol sulfate) .[name].. 1-2 puffs every 4-6 hours as needed shortness of breath 3)  Nexium 40 Mg Cpdr (Esomeprazole magnesium) .... Take 1 tab by mouth two times a day 4)  Flonase 50 Mcg/act Susp (Fluticasone propionate) .... 2 sprays / nostril daily 5)  Estradiol 1 Mg Tabs (Estradiol) .... Take one tablet by mouth once a day 6)  Symbicort 80-4.5 Mcg/act Aero (Budesonide-formoterol fumarate) .... 2 puffs 2 x a day (rinse mouth after each use) 7)  Nebulizer Macine  .... Dx: asthma 8)  Xopenex 1.25 Mg/80ml Nebu (Levalbuterol hcl) .... 3 ml nebs q 6 hrs prn 9)  Claritin 10 Mg Tabs (Loratadine) .... Take 1 tab by mouth once daily  Patient Instructions: 1)  Stay on current meds. 2)  Your asthma is much improved! 3)  I will set you up to see Dr Delford Field for spirometry/ asthma at  [facility]. 4)  f/u BP in 3 mos.   Orders Added: 1)  Pulmonary Referral [Pulmonary] 2)  Rapid Strep [60454] 3)  Est. Patient Level IV [09811]

## 2010-06-17 NOTE — Assessment & Plan Note (Addendum)
Summary: Pulmonary Consultation   Copy to:  Seymour Bars DO Primary Provider/Referring Provider:  Seymour Bars DO  CC:  Pulmonary Consult - asthma.  History of Present Illness: Pulmonary consultation  The patient comes in today for an initial general pulmonary consultation.  The patient complains of cough, coughing up sputum, shortness of breath, shortness of breath with exertion, wheezing, chest pain at rest, chest pain with inspiration/coughing, headache, sore throat, heartburn, hoarseness, fatigue, excessive snoring, gasping, and choking, but denies coughing up blood, chest congestion, chest pain with exertion, PND, orthopnea, pedal edema, sinus pressure, nasal congestion, excessive phlegm production, trouble swallowing, fever, daytime hypersomnolence, non-restorative sleep, and insomnia.  Patient notes dyspnea with ADL's, walking on a flat surface, walking up one flight of stairs, moderate activity, making beds, and while working.  The patient describes their cough as dry, productive, with clear sputum, scant amount, intermittent, paroxysmal, occuring after meals, occuring with exertion, and occuring with cold exposure.  The symptoms appear worse with cold exposure, heat exposure, humidity, smoke exposure, in winter, in summer, in spring, in fall, with exertion, and with exposure toperfumes deoderants, gas, diesel, markers, all strong fumes:.  The symptoms improve with bronchodilators, inhaled corticosteroids, and PPI's.  Treatment to date has included short acting bronchodilators:, long acting bronchodilators:, inhaled corticosteroids:, oral corticosteroids:, nasal corticosteroids:, and PPI's:.  Evaluation to date has included CXR.    Pt notes two years ago.  Pt dx hoarseness and dypsnea  dx asthma  due to fumes ppt factor and chemicals , perfumes. Rx then :  inhaled meds,  singulair.  ? if helpd.  then ok for a while, and as needed saba Pt has had no allergy testing Pt then worse: summer of 2011.   Pt notes since then three diff depo inj and pred pulses.  last pred two months , symbicort past month       symptoms now:  chest tight, hoarse, wheeze,  short of breath, URI freq and prolonged.   ?pndrip. no sneeze,  dx acid reflux:  on nexim  and helps. now sl sore throat dime size area,  cough with tightness, cough is productive and swallows  not thick,  no congestin,  no at bedtime symptoms   Preventive Screening-Counseling & Management  Alcohol-Tobacco     Smoking Status: never     Passive Smoke Exposure: yes  Current Medications (verified): 1)  Amlodipine Besy-Benazepril Hcl 5-40 Mg Caps (Amlodipine Besy-Benazepril Hcl) .[name].. 1 Tab By Mouth Daily 2)  Ventolin Hfa 108 (90 Base) Mcg/act  Aers (Albuterol Sulfate) .[name].. 1-2 Puffs Every 4-6 Hours As Needed Shortness of Breath 3)  Nexium 40 Mg Cpdr (Esomeprazole Magnesium) .... Take 1 Tab By Mouth Two Times A Day 4)  Flonase 50 Mcg/act Susp (Fluticasone Propionate) .... 2 Sprays / Nostril Daily 5)  Estradiol 1 Mg Tabs (Estradiol) .... Take One Tablet By Mouth Once A Day 6)  Symbicort 80-4.5 Mcg/act Aero (Budesonide-Formoterol Fumarate) .... 2 Puffs 2 X A Day (Rinse Mouth After Each Use) 7)  Nebulizer Macine .... Dx: Asthma 8)  Xopenex 1.25 Mg/20ml Nebu (Levalbuterol Hcl) .... 3 Ml Nebs Every 6 Hours As Needed 9)  Claritin 10 Mg Tabs (Loratadine) .... Take 1 Tab By Mouth Once Daily  Allergies (verified): No Known Drug Allergies  Past History:  Past medical, surgical, family and social histories (including risk factors) reviewed, and no changes noted (except as noted below).  Past Medical History: Hypertension endometriosis h/o histoplasmosis   -2002, dx, [place] raised, CXR abn.  no blood tests Reactive Airway Disease Allergic rhinitis Kidney Stones GERD  Past Surgical History: Reviewed history from 12/20/2007 and no changes required. tonsils/adenoids - 1967 uterine lining ablataion - 2002 laparatomy - 1985  Family  History: Reviewed history from 12/20/2007 and no changes required. Family History Diabetes - mother and father Family History High cholesterol- mother and father Family History Hypertension- mother and father  Social History: Reviewed history from 05/27/2010 and no changes required. 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  Passive Smoke Exposure:  yes  Review of Systems       The patient complains of shortness of breath with activity, shortness of breath at rest, productive cough, non-productive cough, acid heartburn, indigestion, difficulty swallowing, sore throat, headaches, nasal congestion/difficulty breathing through nose, and joint stiffness or pain.  The patient denies coughing up blood, chest pain, irregular heartbeats, loss of appetite, weight change, abdominal pain, tooth/dental problems, sneezing, itching, ear ache, anxiety, depression, hand/feet swelling, rash, change in color of mucus, and fever.    Vital Signs:  Patient profile:   50 year old female Menstrual status:  regular Height:      63 inches Weight:      171 pounds BMI:     30.40 O2 Sat:      99 % on Room air Temp:     98.7 degrees F oral Pulse rate:   92 / minute BP sitting:   130 / 80  (left arm) Cuff size:   regular  Vitals Entered By: Gweneth Dimitri RN (June 03, 2010 8:47 AM)  O2 Flow:  Room air CC: Pulmonary Consult - asthma Comments Medications reviewed with patient Daytime contact number verified with patient. Gweneth Dimitri RN  June 03, 2010 8:52 AM    Physical Exam  Additional Exam:  NFA:[id number], obese , in no distress,  defensive  ENT: No lesions,  mouth clear,  oropharynx clear, no postnasal drip Neck: No JVD, no TMG, no carotid bruits Lungs: No use of accessory muscles, no dullness to percussion, prominent pseudowheeze Cardiovascular: RRR, heart sounds normal, no murmur or gallops, no peripheral edema Abdomen: soft and  NT, no HSM,  BS normal Musculoskeletal: No deformities, no cyanosis or clubbing Neuro: alert, non focal Skin: Warm, no lesions or rashes    CXR  Procedure date:  06/03/2010  Findings:      IMPRESSION:   1.  No active cardiopulmonary disease. 2.  Centrally calcified right upper lobe nodule unchanged from 2005 consistent with old granulomatous disease.  Impression & Recommendations:  Problem # 1:  ASTHMA, UNSPECIFIED, UNSPECIFIED STATUS (ICD-493.90) Assessment Deteriorated Multiple issues here.  prob reactive airways disease but also significant somatization and upper airway instability.  Note sinus Ct needed to r/o sinus dz,  pt with GERD, VCD, anxiety issues,  Pt on ACE inhibitor which also is an issue note normal cxr.  prob sleep disordered breathing as well, pn drip syndrome with cyclical cough an issue as well, note normal spirometry plan Stop Nexium, start Dexilant (will need prior auth) one daily Strict reflux diet Stop Benzapril/amlodipine Start Losartan one daily (Benicar samples given - one daily) Stop Claritin Start Chlorpheniramine one twice daily Stop Fluticasone Start Veramyst two sprays each nostril daily Cyclic cough protocol with tussicaps/tessalon perles Stop Symbicort  may use albuterol/xopenex as needed  Sinus Ct scan  use xanax prior to CT scan Sleep study Return 3 weeks [place]   Medications Added to  Medication List This Visit: 1)  Losartan Potassium 100 Mg Tabs (Losartan potassium) .... One by mouth daily 2)  Dexilant 60 Mg Cpdr (Dexlansoprazole) .... Take one by mouth daily pt failed nexium .  needs prior authorization 3)  Veramyst 27.5 Mcg/spray Susp (Fluticasone furoate) .... Two puffs each nostril daily pt failed fluticasone  needs prior auth 4)  Symbicort 80-4.5 Mcg/act Aero (Budesonide-formoterol fumarate) .... Hold 5)  Xopenex 1.25 Mg/62ml Nebu (Levalbuterol hcl) .... 3 ml nebs every 6 hours as needed 6)  Chlorpheniramine Maleate 12 Mg  Cr-tabs (Chlorpheniramine maleate) .... One by mouth two times a day 7)  Tussicaps 10-8 Mg Xr12h-cap (Hydrocod polst-chlorphen polst) .... One by mouth two times a day as needed cough 8)  Benzonatate 100 Mg Caps (Benzonatate) .... One by mouth three times a day as needed cough 9)  Alprazolam 0.25 Mg Tabs (Alprazolam) .... Take one by mouth prior to ct scan  Complete Medication List: 1)  Losartan Potassium 100 Mg Tabs (Losartan potassium) .... One by mouth daily 2)  Ventolin Hfa 108 (90 Base) Mcg/act Aers (Albuterol sulfate) .[name].. 1-2 puffs every 4-6 hours as needed shortness of breath 3)  Dexilant 60 Mg Cpdr (Dexlansoprazole) .... Take one by mouth daily pt failed nexium .  needs prior authorization 4)  Veramyst 27.5 Mcg/spray Susp (Fluticasone furoate) .... Two puffs each nostril daily pt failed fluticasone  needs prior auth 5)  Estradiol 1 Mg Tabs (Estradiol) .... Take one tablet by mouth once a day 6)  Symbicort 80-4.5 Mcg/act Aero (Budesonide-formoterol fumarate) .... Hold 7)  Nebulizer Macine  .... Dx: asthma 8)  Xopenex 1.25 Mg/92ml Nebu (Levalbuterol hcl) .... 3 ml nebs every 6 hours as needed 9)  Chlorpheniramine Maleate 12 Mg Cr-tabs (Chlorpheniramine maleate) .... One by mouth two times a day 10)  Tussicaps 10-8 Mg Xr12h-cap (Hydrocod polst-chlorphen polst) .... One by mouth two times a day as needed cough 11)  Benzonatate 100 Mg Caps (Benzonatate) .... One by mouth three times a day as needed cough 12)  Alprazolam 0.25 Mg Tabs (Alprazolam) .... Take one by mouth prior to ct scan  Other Orders: T-2 View CXR (71020TC) T-"RAST" (Allergy Full Profile) IGE (04540-98119) T-Fungal Panel Immuno (86612/[id number]) T-Hypersens Panel ([id number]/[id number]) New Patient Level V (14782) Spirometry w/Graph (94010) Radiology Referral (Radiology) Sleep Study (Sleep Study)  Patient Instructions: 1)  Stop Nexium, start Dexilant (will need prior auth) one daily 2)  Strict reflux  diet 3)  Stop Benzapril/amlodipine 4)  Start Losartan one daily (Benicar samples given - one daily) 5)  Stop Claritin 6)  Start Chlorpheniramine one twice daily 7)  Stop Fluticasone 8)  Start Veramyst two sprays each nostril daily 9)  Cyclic cough protocol with tussicaps/tessalon perles 10)  Stop Symbicort  11)  may use albuterol/xopenex as needed  12)  Chest xray today 13)  Labs today 14)  Sinus Ct scan  use xanax prior to CT scan 15)  Sleep study 16)  Return 3 weeks [place]  Prescriptions: ALPRAZOLAM 0.25 MG TABS (ALPRAZOLAM) take one by mouth prior to CT scan  #1 x 0   Entered and Authorized by:   Storm Frisk MD   Signed by:   Storm Frisk MD on 06/03/2010   Method used:   Print then Give to Patient   RxID:   [id number] VERAMYST 27.5 MCG/SPRAY  SUSP (FLUTICASONE FUROATE) Two puffs each nostril daily pt failed fluticasone  needs prior auth  #1 x 6  Entered by:   Gweneth Dimitri RN   Authorized by:   Storm Frisk MD   Signed by:   Gweneth Dimitri RN on 06/03/2010   Method used:   Telephoned to ...       [facility] [phone]* (retail)       [address]       [place], [place]  [address]       Ph: [phone]       Fax: [phone]   RxID:   [id number] DEXILANT 60 MG CPDR (DEXLANSOPRAZOLE) Take one by mouth daily Pt failed Nexium .  Needs prior authorization  #30 x 6   Entered by:   Gweneth Dimitri RN   Authorized by:   Storm Frisk MD   Signed by:   Gweneth Dimitri RN on 06/03/2010   Method used:   Telephoned to ...       [facility] [phone]* (retail)       [address]       [place], [place]  [address]       Ph: [phone]       Fax: [phone]   RxID:   [id number] BENZONATATE 100 MG CAPS (BENZONATATE) one by mouth three times a day as needed cough  #90 x 3   Entered and Authorized by:   Storm Frisk MD   Signed by:   Storm Frisk MD on 06/03/2010   Method  used:   Electronically to        [facility] [phone]* (retail)       [address]       [place], [place]  [address]       Ph: [phone]       Fax: [phone]   RxID:   [id number] TUSSICAPS 10-8 MG XR12H-CAP (HYDROCOD POLST-CHLORPHEN POLST) one by mouth two times a day as needed cough  #10 x 0   Entered and Authorized by:   Storm Frisk MD   Signed by:   Storm Frisk MD on 06/03/2010   Method used:   Print then Give to Patient   RxID:   [id number] CHLORPHENIRAMINE MALEATE 12 MG CR-TABS (CHLORPHENIRAMINE MALEATE) one by mouth two times a day  #60 x 6   Entered and Authorized by:   Storm Frisk MD   Signed by:   Storm Frisk MD on 06/03/2010   Method used:   Electronically to        [facility] [phone]* (retail)       [address]       [place], [place]  [address]       Ph: [phone]       Fax: [phone]   RxID:   [id number] VERAMYST 27.5 MCG/SPRAY  SUSP (FLUTICASONE FUROATE) Two puffs each nostril daily pt failed fluticasone  needs prior auth  #1 x 6   Entered and Authorized by:   Storm Frisk MD   Signed by:   Storm Frisk MD on 06/03/2010   Method used:   Print then Give to Patient   RxID:   [id number] DEXILANT 60 MG CPDR (DEXLANSOPRAZOLE) Take one by mouth daily Pt failed Nexium .  Needs prior authorization  #30 x 6   Entered and Authorized by:  Storm Frisk MD   Signed by:   Storm Frisk MD on 06/03/2010   Method used:   Print then Give to Patient   RxID:   [id number] OZHYQMVH POTASSIUM 100 MG TABS (LOSARTAN POTASSIUM) one by mouth daily  #30 x 6   Entered and Authorized by:   Storm Frisk MD   Signed by:   Storm Frisk MD on 06/03/2010   Method used:   Print then Give to Patient   RxID:   [id number]    Printed delixant and printed veramyst rxs were voided/placed in shred bin.  [facility]. Gweneth Dimitri RN  June 03, 2010 9:36 AM

## 2010-06-17 NOTE — Assessment & Plan Note (Signed)
Summary: asthma   Vital Signs:  Patient profile:   50 year old female Menstrual status:  regular Height:      62 inches Weight:      172 pounds BMI:     31.57 O2 Sat:      99 % on Room air Temp:     98.1 degrees F oral Pulse rate:   100 / minute BP sitting:   137 / 75  (left arm) Cuff size:   regular  Vitals Entered By: Payton Spark CMA (May 13, 2010 1:57 PM)  O2 Flow:  Room air CC: F/U asthma   Primary Care Provider:  Seymour Bars DO  CC:  F/U asthma.  History of Present Illness: 50 yo WF presents for f/u asthma exacerbation.  She was doing better until 2 hrs ago.    She was exposed to rubber bracelets at work 2 hrs ago and it started making her cough and feel SOB and chest tightness.    After she was seen last wk and given Depo Medrol Injection, was started on Symbicort two times a day + Albuterol Nebs 3-4 x a day, her asthma had improved.  her URI symptoms including nighttime cough on Hycodan had much improved.  She is c/o jitteriness from use of the Albuterol.    Current Medications (verified): 1)  Amlodipine Besy-Benazepril Hcl 5-40 Mg Caps (Amlodipine Besy-Benazepril Hcl) .[name].. 1 Tab By Mouth Daily 2)  Ventolin Hfa 108 (90 Base) Mcg/act  Aers (Albuterol Sulfate) .[name].. 1-2 Puffs Every 4-6 Hours As Needed Shortness of Breath 3)  Nexium 40 Mg Cpdr (Esomeprazole Magnesium) .... Take 1 Tab By Mouth Two Times A Day 4)  Flonase 50 Mcg/act Susp (Fluticasone Propionate) .... 2 Sprays / Nostril Daily 5)  Estradiol 1 Mg Tabs (Estradiol) .... Take One Tablet By Mouth Once A Day 6)  Symbicort 80-4.5 Mcg/act Aero (Budesonide-Formoterol Fumarate) .... 2 Puffs 2 X A Day (Rinse Mouth After Each Use) 7)  Nebulizer Macine .... Dx: Asthma 8)  Albuterol Sulfate (5 Mg/ml) 0.5% Nebu (Albuterol Sulfate) .... 5 Mg Neb Q 4-6 Hrs As Needed Wheezing 9)  Hydrocodone-Homatropine 5-1.5 Mg Tabs (Hydrocodone-Homatropine) .... 5 Ml Q 6 Hrs As Needed Cough  Allergies (verified): No Known Drug  Allergies  Past History:  Past Medical History: Reviewed history from 01/13/2010 and no changes required. Hypertension endometriosis h/o histoplasmosis Reactive Airway Disease Allergic rhinitis Kidney Stones GI  Social History: Reviewed history from 12/20/2007 and no changes required. 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   Head:  normocephalic and atraumatic.   Eyes:  conjunctiva clear Nose:  no nasal discharge.   Mouth:  mild hoarseness of voice, o/p pink and moist, no injection Neck:  no masses.   Lungs:  poor air movement over the bases with dry cough and exp wheezing, no rhonchi Heart:  Normal rate and regular rhythm. S1 and S2 normal without gallop, murmur, click, rub or other extra sounds. Skin:  color normal.   Cervical Nodes:  No lymphadenopathy noted   Impression & Recommendations:  Problem # 1:  ASTHMA, WITH ACUTE EXACERBATION (ICD-493.92) Her flare up from last wk did improve but she has another mild flare x 2 hrs after inhalation of fumes at work today. Will retreat with SoluMedrol 125 mg IM x 1.  If not much improved by tomorrow AM, will start Prednisone taper.  Will  change her Albuterol nebs to Xopenex meds due to jitteriness.  Stay on Symbicort two times a day.  RTC in 2 wks. Her updated medication list for this problem includes:    Ventolin Hfa 108 (90 Base) Mcg/act Aers (Albuterol sulfate) .[name]... 1-2 puffs every 4-6 hours as needed shortness of breath    Symbicort 80-4.5 Mcg/act Aero (Budesonide-formoterol fumarate) .[name]... 2 puffs 2 x a day (rinse mouth after each use)    Xopenex 1.25 Mg/29ml Nebu (Levalbuterol hcl) .Marland KitchenMarland KitchenMarland KitchenMarland Kitchen 3 ml nebs q 6 hrs prn    Prednisone 20 Mg Tabs (Prednisone) .Marland KitchenMarland KitchenMarland KitchenMarland Kitchen 3 tabs by mouth once daily x 2 days then 2 tabs by mouth once daily x 2 days then 1 tab by mouth daily x 2 days then 1/2 tab by mouth daily x 2  days  Orders: Solumedrol up to 125mg  (A5409) Admin of Therapeutic Inj  intramuscular or subcutaneous (81191)  Problem # 2:  ALLERGIC RHINITIS (ICD-477.9) I will ask pt to stay on Flonase but add Claritin 10 mg once daily. Her updated medication list for this problem includes:    Flonase 50 Mcg/act Susp (Fluticasone propionate) .[name]... 2 sprays / nostril daily  Complete Medication List: 1)  Amlodipine Besy-benazepril Hcl 5-40 Mg Caps (Amlodipine besy-benazepril hcl) .[name].. 1 tab by mouth daily 2)  Ventolin Hfa 108 (90 Base) Mcg/act Aers (Albuterol sulfate) .[name].. 1-2 puffs every 4-6 hours as needed shortness of breath 3)  Nexium 40 Mg Cpdr (Esomeprazole magnesium) .... Take 1 tab by mouth two times a day 4)  Flonase 50 Mcg/act Susp (Fluticasone propionate) .... 2 sprays / nostril daily 5)  Estradiol 1 Mg Tabs (Estradiol) .... Take one tablet by mouth once a day 6)  Symbicort 80-4.5 Mcg/act Aero (Budesonide-formoterol fumarate) .... 2 puffs 2 x a day (rinse mouth after each use) 7)  Nebulizer Macine  .... Dx: asthma 8)  Xopenex 1.25 Mg/36ml Nebu (Levalbuterol hcl) .... 3 ml nebs q 6 hrs prn 9)  Hydrocodone-homatropine 5-1.5 Mg Tabs (Hydrocodone-homatropine) .... 5 ml q 6 hrs as needed cough 10)  Prednisone 20 Mg Tabs (Prednisone) .... 3 tabs by mouth once daily x 2 days then 2 tabs by mouth once daily x 2 days then 1 tab by mouth daily x 2 days then 1/2 tab by mouth daily x 2 days  Patient Instructions: 1)  For asthma flare up: 2)  Steroid shot today. 3)  If not feeling a lot better tomorrow morning, go ahead and start the Prednisone taper 60-40-20-10 for 8 day taper and then STOP. 4)  Substitute Xopenex for Albuterol into neb machine and use AS NEEDED OR Venolin Inhaler for portable use. 5)  Stay on Symbicort 2 x a day for maintenance. 6)  Return for f/u asthma in 2 wks. Prescriptions: PREDNISONE 20 MG TABS (PREDNISONE) 3 tabs by mouth once daily x 2 days then 2 tabs by mouth once daily x 2  days then 1 tab by mouth daily x 2 days then 1/2 tab by mouth daily x 2 days  #13 tabs x 0   Entered and Authorized by:   Seymour Bars DO   Signed by:   Seymour Bars DO on 05/13/2010   Method used:   Print then Give to Patient   RxID:   [id number] HYDROCODONE-HOMATROPINE 5-1.5 MG TABS (HYDROCODONE-HOMATROPINE) 5 ml q 6 hrs as needed cough  #100 ml x 0   Entered and Authorized by:   Seymour Bars DO   Signed by:   Seymour Bars  DO on 05/13/2010   Method used:   Print then Give to Patient   RxID:   [id number] XOPENEX 1.25 MG/3ML NEBU (LEVALBUTEROL HCL) 3 ml nebs q 6 hrs prn  #3 boxes x 0   Entered and Authorized by:   Seymour Bars DO   Signed by:   Seymour Bars DO on 05/13/2010   Method used:   [facility]ron[facility] to        [facility] [phone]* (retail)       [address]       [place], [place]  [address]       Ph: [phone]       Fax: [phone]   RxID:   [id number]    Medication Administration  Injection # 1:    Medication: Solumedrol up to 125mg     Diagnosis: ASTHMA, WITH ACUTE EXACERBATION (ICD-493.92)    Route: IM    Site: LUOQ gluteus    Exp Date: 10/2012    Lot #: [id number]    Patient tolerated injection without complications    Given by: Payton Spark CMA (May 13, 2010 2:27 PM)  Orders Added: 1)  Solumedrol up to 125mg  [J2930] 2)  Admin of Therapeutic Inj  intramuscular or subcutaneous [96372] 3)  Est. Patient Level IV [59563]     Medication Administration  Injection # 1:    Medication: Solumedrol up to 125mg     Diagnosis: ASTHMA, WITH ACUTE EXACERBATION (OVF-643.32)    Route: IM    Site: LUOQ gluteus    Exp Date: 10/2012    Lot #: [id number]    Patient tolerated injection without complications    Given by: Payton Spark CMA (May 13, 2010 2:27 PM)  Orders Added: 1)  Solumedrol up to 125mg  [J2930] 2)  Admin of Therapeutic Inj  intramuscular or subcutaneous [96372] 3)  Est. Patient Level IV [41660]

## 2010-06-17 NOTE — Progress Notes (Signed)
Summary: CXR result  Phone Note Outgoing Call   Reason for Call: Discuss lab or test results Summary of Call: call pt and tell her the CXR was normal Initial call taken by: Storm Frisk MD,  June 03, 2010 3:08 PM  Follow-up for Phone Call        called, spoke with pt.  She was informed cxr normal per pw.  She verbalized understanding. Follow-up by: Gweneth Dimitri RN,  June 03, 2010 5:10 PM

## 2010-06-17 NOTE — Progress Notes (Signed)
Summary: dexilant/veramyst rx > no PA needed  Phone Note Outgoing Call   Call placed by: Gweneth Dimitri RN,  June 03, 2010 9:36 AM Call placed to: [facility] Summary of Call: Per Dr. Delford Field, dexilant and veramyst rxs will need prior auth.  [facility] meds [facility] [address] to [place] in[place] to begin prior auth process.  She will process rxs asap inorder to see if prior Berkley Harvey is required.  If so, will fax prior auth request to [place] office within the next hour.  Will hold this message in Prior Auth box as Chryl Heck is working on PA/rx this am. Initial call taken by: Gweneth Dimitri RN,  June 03, 2010 9:39 AM  Follow-up for Phone Call        called [facility], per pharmacist pt picked up her veramyst and dexilant on 1.19.12 > no PA needed on these meds.  will sign off. Boone Master CNA/MA  June 08, 2010 12:48 PM

## 2010-06-17 NOTE — Progress Notes (Signed)
Summary: ? O2  Phone Note Call from Patient Call back at Home Phone [phone]   Caller: Patient Call For: Seymour Bars DO Summary of Call: Call pt - she called nad LM wanting to know why Christoper Allegra is bringing O2 out to her to have Initial call taken by: Kathlene November LPN,  May 26, 2010 11:13 AM  Follow-up for Phone Call        I recieved an overnight oximetry report and pt desatted into the 80s at night which qualifies her for oxygen.  If she is opposed to starting this, we will need to get a sleep study to make sure she doesn't have sleep apnea.   Follow-up by: Seymour Bars DO,  May 26, 2010 11:26 AM  Additional Follow-up for Phone Call Additional follow up Details #1::        Pt aware of the above. Pt to talk w/ husband and CB w/ decision.  Additional Follow-up by: Payton Spark CMA,  May 26, 2010 12:02 PM

## 2010-06-17 NOTE — Progress Notes (Signed)
Summary: Call patient back as soon as possible for a question she has  Phone Note Call from Patient Call back at Home Phone [phone]   Caller: Patient Summary of Call: Patient called and left a message stating that someone called from ?[employer] and states that they are bringing her a Pulse Oxemetery to wear on her finger over night and patient states that she was not aware that she had to do this... Please call patient back at [phone] Initial call taken by: Michaelle Copas,  May 20, 2010 1:55 PM  Follow-up for Phone Call        Pt called back stating order was signed by Dr. B on 05/13/10. I advised Pt to accept Pulse O2 bc insurance may be requiring it for billing purposes.  Follow-up by: Payton Spark CMA,  May 20, 2010 3:15 PM

## 2010-06-17 NOTE — Miscellaneous (Signed)
Summary: ONO RA  Clinical Lists Changes  Observations: Added new observation of SLEEP STUDY: Low Oxygen Sat: 81% Oximetry overnight on        RA          =  17 min sats < 90%  (05/21/2010 15:10)      Sleep Study  Procedure date:  05/21/2010  Findings:      Low Oxygen Sat: 81% Oximetry overnight on        RA          =  17 min sats < 90%

## 2010-06-17 NOTE — Procedures (Signed)
Summary: Oximetry/[facility]   Oximetry/[facility]   Imported By: Lanelle Bal 06/04/2010 14:18:59  _____________________________________________________________________  External Attachment:    Type:   Image     Comment:   External Document

## 2010-06-18 ENCOUNTER — Telehealth: Payer: Self-pay | Admitting: Critical Care Medicine

## 2010-06-23 NOTE — Progress Notes (Signed)
Summary: STILL WAITING ON 2 MEDS  Phone Note Call from Patient Call back at Home Phone [phone]   Caller: Patient Call For: Amish Mintzer Summary of Call: STILL WAITNG ON A CALL BACK ON 2 MEDS THAT WE HAD TO CONTACT HER INS [employer] ON PT IS OUT SAMPLES Initial call taken by: Lacinda Axon,  June 18, 2010 8:12 AM  Follow-up for Phone Call        Spoke with pt and she staets that she was waiting to hear back about rx that were prescribed at last ov. Looks like the rx was called in on 06-03-10 but pt states the pharmacy does not have them. I called the pharmacy and they do have veramyst, dexilant but not losartan potassium so I called this in. Pt aware.  Carron Curie CMA  June 18, 2010 10:10 AM

## 2010-06-24 ENCOUNTER — Ambulatory Visit: Payer: Self-pay | Admitting: Critical Care Medicine

## 2010-06-27 ENCOUNTER — Telehealth: Payer: Self-pay | Admitting: Family Medicine

## 2010-06-28 ENCOUNTER — Telehealth (INDEPENDENT_AMBULATORY_CARE_PROVIDER_SITE_OTHER): Payer: Self-pay | Admitting: *Deleted

## 2010-07-01 ENCOUNTER — Other Ambulatory Visit: Payer: Self-pay | Admitting: Critical Care Medicine

## 2010-07-01 ENCOUNTER — Telehealth (INDEPENDENT_AMBULATORY_CARE_PROVIDER_SITE_OTHER): Payer: Self-pay | Admitting: *Deleted

## 2010-07-01 DIAGNOSIS — J329 Chronic sinusitis, unspecified: Secondary | ICD-10-CM

## 2010-07-03 ENCOUNTER — Ambulatory Visit (HOSPITAL_BASED_OUTPATIENT_CLINIC_OR_DEPARTMENT_OTHER)
Admission: RE | Admit: 2010-07-03 | Discharge: 2010-07-03 | Disposition: A | Discharge status: Home / Self Care | Payer: 59 | Source: Ambulatory Visit | Attending: Critical Care Medicine | Admitting: Critical Care Medicine

## 2010-07-03 ENCOUNTER — Telehealth: Payer: Self-pay | Admitting: Critical Care Medicine

## 2010-07-03 DIAGNOSIS — J339 Nasal polyp, unspecified: Secondary | ICD-10-CM | POA: Insufficient documentation

## 2010-07-03 DIAGNOSIS — J329 Chronic sinusitis, unspecified: Secondary | ICD-10-CM | POA: Insufficient documentation

## 2010-07-07 NOTE — Progress Notes (Signed)
Summary: sinus CT  Phone Note Call from Patient Call back at Home Phone [phone]   Caller: Patient Call For: wright Summary of Call: pt wants sinus CT set up. she requests either a fri evening or sat morning appt.  Initial call taken by: Tivis Ringer, CNA,  June 28, 2010 4:26 PM  Follow-up for Phone Call        Will forward to [facility] per last ov note pt needed sinius CT done and order states pt was to call when ready to sched.  Vernie Murders  June 28, 2010 4:58 PM   leslie put me an order in pcc box thnaks libby Follow-up by: Oneita Jolly,  June 29, 2010 10:38 AM  Additional Follow-up for Phone Call Additional follow up Details #1::        Order is in EMR.  Spoke with pt and gave her [facility] # in [place] office to get CT scheduled at that location. Abigail Miyamoto RN  June 29, 2010 10:44 AM

## 2010-07-07 NOTE — Progress Notes (Signed)
Summary: requests to have CT set up  Phone Note Call from Patient Call back at Home Phone [phone]   Caller: Patient Call For: wright Summary of Call: pt needs sinus CT scheduled. she says she doesn't understand what the hold up is. has been waiting for a while for this.  Initial call taken by: Tivis Ringer, CNA,  July 01, 2010 8:46 AM  Follow-up for Phone Call        Called, spoke with pt.  States she is ready to have sinus CT but requesting this to be done on a Friday evening or Saturday because she will need to take xanax prior to this and will need a driver.  Pt would also like this to be done at [facility] in [place].  I advised pt not sure if they are open on Saturday but someone would call her back regarding date/time/location of sinus CT.  She verbalized understanding.  Looks like order was placed on 06/03/10 for this and pt was too call helen in [place] office when she is ready to set this appt up.  Will forward to [facility] at [facility] office, pls advise if you can schedule this for pt.  Thanks! Follow-up by: Gweneth Dimitri RN,  July 01, 2010 11:38 AM  Additional Follow-up for Phone Call Additional follow up Details #1::        spoke to pt she wants to set up the sinus ct but not the npsg yet she will call [place] and set up this limited sinus ct and i faxed the order to them  Additional Follow-up by: Oneita Jolly,  July 01, 2010 2:28 PM

## 2010-07-07 NOTE — Progress Notes (Signed)
Summary: allergy testing  Phone Note Outgoing Call   Summary of Call: Pls fax copy of [facility] labs to Dr Shan Levans and let pt know that I reviewed her allergy test results and she looks to only be allergic to cats.   Initial call taken by: Seymour Bars DO,  June 27, 2010 11:05 AM

## 2010-07-07 NOTE — Progress Notes (Signed)
Summary: ct needs precertification  Phone Note From Other Clinic   Caller: [facility] Call For: WRIGHT Summary of Call: brittany phoned stated that the CT scheduled for Saturday needs to be preceritified. [place] can be reached at [phone] Initial call taken by: Vedia Coffer,  July 01, 2010 3:04 PM  Follow-up for Phone Call        prior auth # [id number] britney notified Follow-up by: Oneita Jolly,  July 01, 2010 3:33 PM

## 2010-07-08 ENCOUNTER — Ambulatory Visit (INDEPENDENT_AMBULATORY_CARE_PROVIDER_SITE_OTHER): Payer: 59 | Admitting: Critical Care Medicine

## 2010-07-08 ENCOUNTER — Encounter: Payer: Self-pay | Admitting: Critical Care Medicine

## 2010-07-08 DIAGNOSIS — K219 Gastro-esophageal reflux disease without esophagitis: Secondary | ICD-10-CM

## 2010-07-08 DIAGNOSIS — J309 Allergic rhinitis, unspecified: Secondary | ICD-10-CM | POA: Insufficient documentation

## 2010-07-13 NOTE — Progress Notes (Signed)
Summary: CT sinus neg  Phone Note Outgoing Call   Reason for Call: Discuss lab or test results Summary of Call: call pt and tell her ct scan was neg for sinusitis Initial call taken by: Storm Frisk MD,  July 03, 2010 6:58 PM  Follow-up for Phone Call        Called, spoke with pt.  She was informed of above ct results per PW.  She verbalized understanding. Follow-up by: Gweneth Dimitri RN,  July 06, 2010 8:43 AM

## 2010-07-13 NOTE — Assessment & Plan Note (Signed)
Summary: Dominique [name]   Copy to:  Seymour Bars DO Primary Provider/Referring Provider:  Seymour Bars DO  CC:  Follow up.  Pt states she has improved.  Denies SOB, wheezing, and chest tightness.  Cough resolved however started having PND and nonprod cough last night..  History of Present Illness: Dominique  [name]  50 yo WF with upper airway instability syndrome but no true asthma.  Pt with allergic rhinitis, hypersensitive to cat and dog dander.         symptoms now:  chest tight, hoarse, wheeze,  short of breath, URI freq and prolonged.   ?pndrip. no sneeze,  dx acid reflux:  on nexim  and helps. now sl sore throat dime size area,  cough with tightness, cough is productive and swallows  not thick,  no congestin,  no at bedtime symptoms  July 08, 2010 9:19 AM Chest tightness is better.  No cough as before, but now a sore throat and pndrip x 24hrs.  No fever.  Cough is dry.    Preventive Screening-Counseling & Management  Alcohol-Tobacco     Smoking Status: never     Passive Smoke Exposure: yes  Current Medications (verified): 1)  Losartan Potassium 100 Mg Tabs (Losartan Potassium) .... One By Mouth Daily 2)  Ventolin Hfa 108 (90 Base) Mcg/act  Aers (Albuterol Sulfate) .[name].. 1-2 Puffs Every 4-6 Hours As Needed Shortness of Breath 3)  Dexilant 60 Mg Cpdr (Dexlansoprazole) .... Take One By Mouth Daily Pt Failed Nexium .  Needs Prior Authorization 4)  Veramyst 27.5 Mcg/spray  Susp (Fluticasone Furoate) .... Two Puffs Each Nostril Daily Pt Failed Fluticasone  Needs Prior Auth 5)  Estradiol 1 Mg Tabs (Estradiol) .... Take One Tablet By Mouth Once A Day 6)  Symbicort 80-4.5 Mcg/act Aero (Budesonide-Formoterol Fumarate) .... Hold 7)  Nebulizer Macine .... Dx: Asthma 8)  Xopenex 1.25 Mg/82ml Nebu (Levalbuterol Hcl) .... 3 Ml Nebs Every 6 Hours As Needed 9)  [facility] Allergy Relief 10 Mg Tabs (Loratadine) .... Take 1 Tablet By Mouth Once A Day 10)  Benzonatate 100 Mg Caps (Benzonatate) .... One By  Mouth Three Times A Day As Needed Cough 11)  Vitamin D3 1000 Unit Tabs (Cholecalciferol) .... Take 1 Tablet By Mouth Once A Day 12)  Vitamin D3 50000 Unit Caps (Cholecalciferol) .... Once Weekly  Allergies (verified): No Known Drug Allergies  Past History:  Past medical, surgical, family and social histories (including risk factors) reviewed, and no changes noted (except as noted below).  Past Medical History: Reviewed history from 06/03/2010 and no changes required. Hypertension endometriosis h/o histoplasmosis   -2002, dx, [place] raised, CXR abn. no blood tests Reactive Airway Disease Allergic rhinitis Kidney Stones GERD  Past Surgical History: Reviewed history from 12/20/2007 and no changes required. tonsils/adenoids - 1967 uterine lining ablataion - 2002 laparatomy - 1985  Family History: Reviewed history from 12/20/2007 and no changes required. Family History Diabetes - mother and father Family History High cholesterol- mother and father Family History Hypertension- mother and father  Social History: Reviewed history from 06/03/2010 and no changes required. 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  Review of Systems       The patient complains of non-productive cough and nasal congestion/difficulty breathing through nose.  The patient denies shortness of breath with activity, shortness of breath at rest, productive cough, coughing up blood, chest pain, irregular heartbeats, acid heartburn, indigestion, loss of appetite, weight change,  abdominal pain, difficulty swallowing, sore throat, tooth/dental problems, headaches, sneezing, itching, ear ache, anxiety, depression, hand/feet swelling, joint stiffness or pain, rash, change in color of mucus, and fever.    Vital Signs:  Patient profile:   50 year old female Menstrual status:  regular Height:      63 inches Weight:      169 pounds BMI:      30.05 O2 Sat:      98 % on Room air Temp:     98.4 degrees F oral Pulse rate:   89 / minute BP sitting:   140 / 88  (left arm) Cuff size:   regular  Vitals Entered By: Gweneth Dimitri RN (July 08, 2010 8:45 AM)  O2 Flow:  Room air CC: Follow up.  Pt states she has improved.  Denies SOB, wheezing, chest tightness.  Cough resolved however started having PND and nonprod cough last night. Comments Medications reviewed with patient Daytime contact number verified with patient. Gweneth Dimitri RN  July 08, 2010 8:47 AM    Physical Exam  Additional Exam:  JXB:[id number], obese , in no distress,  defensive  ENT: No lesions,  mouth clear,  oropharynx clear, no postnasal drip Neck: No JVD, no TMG, no carotid bruits Lungs: No use of accessory muscles, no dullness to percussion, prominent pseudowheeze Cardiovascular: RRR, heart sounds normal, no murmur or gallops, no peripheral edema Abdomen: soft and NT, no HSM,  BS normal Musculoskeletal: No deformities, no cyanosis or clubbing Neuro: alert, non focal Skin: Warm, no lesions or rashes    Impression & Recommendations:  Problem # 1:  ALLERGIC RHINITIS (ICD-477.9) Assessment Improved cyclical cough syndrome with postnasal drip from allergic rhinitis, and GERD as both are ppt factors to cough, no true asthma plan Stop ventolin/symbicort/xopenex  Stay on Dexilant daily Follow reflux diet Avoid Dog dander and cat dander Stay on loratidine/veramyst Return as needed  Her updated medication list for this problem includes:    Veramyst 27.5 Mcg/spray Susp (Fluticasone furoate) .[name]..[name] Two puffs each nostril daily pt failed fluticasone  needs prior auth    Cvs Allergy Relief 10 Mg Tabs (Loratadine) .[name]... Take 1 tablet by mouth once a day  Orders: Est. Patient Level III (62130)  Problem # 2:  GERD (ICD-530.81) Assessment: Unchanged as above, cough better on dexilant plan cont reflux diet and dexilant Her updated medication list for this  problem includes:    Dexilant 60 Mg Cpdr (Dexlansoprazole) .[name]... Take one by mouth daily pt failed nexium .  needs prior authorization  Orders: Est. Patient Level III (86578)  Medications Added to Medication List This Visit: 1)  Cvs Allergy Relief 10 Mg Tabs (Loratadine) .... Take 1 tablet by mouth once a day 2)  Vitamin D3 1000 Unit Tabs (Cholecalciferol) .... Take 1 tablet by mouth once a day 3)  Vitamin D3 50000 Unit Caps (Cholecalciferol) .... Once weekly  Complete Medication List: 1)  Losartan Potassium 100 Mg Tabs (Losartan potassium) .... One by mouth daily 2)  Dexilant 60 Mg Cpdr (Dexlansoprazole) .... Take one by mouth daily pt failed nexium .  needs prior authorization 3)  Veramyst 27.5 Mcg/spray Susp (Fluticasone furoate) .... Two puffs each nostril daily pt failed fluticasone  needs prior auth 4)  Estradiol 1 Mg Tabs (Estradiol) .... Take one tablet by mouth once a day 5)  Cvs Allergy Relief 10 Mg Tabs (Loratadine) .... Take 1 tablet by mouth once a day 6)  Benzonatate 100 Mg Caps (Benzonatate) .... One by  mouth three times a day as needed cough 7)  Vitamin D3 1000 Unit Tabs (Cholecalciferol) .... Take 1 tablet by mouth once a day 8)  Vitamin D3 50000 Unit Caps (Cholecalciferol) .... Once weekly  Patient Instructions: 1)  Stop ventolin/symbicort/xopenex  2)  Stay on Dexilant daily 3)  Follow reflux diet 4)  Avoid Dog dander and cat dander 5)  Stay on loratidine/veramyst 6)  Return as needed  Prescriptions: LOSARTAN POTASSIUM 100 MG TABS (LOSARTAN POTASSIUM) one by mouth daily  #90 x 3   Entered by:   Gweneth Dimitri RN   Authorized by:   Storm Frisk MD   Signed by:   Gweneth Dimitri RN on 07/08/2010   Method used:   Print then Give to Patient   RxID:   [id number] VERAMYST 27.5 MCG/SPRAY  SUSP (FLUTICASONE FUROATE) Two puffs each nostril daily pt failed fluticasone  needs prior auth  #3 x 4   Entered and Authorized by:   Storm Frisk MD   Signed by:    Storm Frisk MD on 07/08/2010   Method used:   Print then Give to Patient   RxID:   [id number] DEXILANT 60 MG CPDR (DEXLANSOPRAZOLE) Take one by mouth daily Pt failed Nexium .  Needs prior authorization  #90 x 4   Entered and Authorized by:   Storm Frisk MD   Signed by:   Storm Frisk MD on 07/08/2010   Method used:   Print then Give to Patient   RxID:   [id number]

## 2010-07-14 ENCOUNTER — Encounter: Payer: Self-pay | Admitting: Family Medicine

## 2010-08-01 LAB — DIFFERENTIAL
Basophils Relative: 2 % — ABNORMAL HIGH (ref 0–1)
Lymphs Abs: 1.4 10*3/uL (ref 0.7–4.0)
Monocytes Absolute: 0.5 10*3/uL (ref 0.1–1.0)
Monocytes Relative: 7 % (ref 3–12)
Neutro Abs: 5 10*3/uL (ref 1.7–7.7)

## 2010-08-01 LAB — CBC
HCT: 43.2 % (ref 36.0–46.0)
Hemoglobin: 14.7 g/dL (ref 12.0–15.0)
MCHC: 34.1 g/dL (ref 30.0–36.0)
RBC: 4.91 MIL/uL (ref 3.87–5.11)

## 2010-08-01 LAB — BASIC METABOLIC PANEL
CO2: 30 mEq/L (ref 19–32)
GFR calc Af Amer: >60 mL/min (ref >60)
GFR calc non Af Amer: >60 mL/min (ref >60)
Glucose, Bld: 116 mg/dL — ABNORMAL HIGH (ref 70–99)

## 2010-08-01 LAB — URINALYSIS, ROUTINE W REFLEX MICROSCOPIC
Bilirubin Urine: NEGATIVE
Leukocytes, UA: NEGATIVE
Nitrite: NEGATIVE
Protein, ur: NEGATIVE mg/dL
pH: 6 (ref 5.0–8.0)

## 2010-08-01 LAB — URINE MICROSCOPIC-ADD ON

## 2010-08-13 ENCOUNTER — Other Ambulatory Visit: Payer: Self-pay | Admitting: *Deleted

## 2010-08-13 NOTE — Telephone Encounter (Signed)
Received form from [facility] requesting to change dexilant to omeprazole or pantoprazole.  Dr. Delford Field completed the form to change dexilant to omeprazole 20mg  every 24 hours.  Form faxed back to [facility] at [phone]

## 2010-09-01 ENCOUNTER — Ambulatory Visit: Payer: Self-pay | Admitting: Family Medicine

## 2011-02-28 ENCOUNTER — Other Ambulatory Visit: Payer: Self-pay | Admitting: *Deleted

## 2011-02-28 MED ORDER — FLUTICASONE FUROATE 27.5 MCG/SPRAY NA SUSP: 2.0000 | Freq: Every day | NASAL | Status: DC

## 2011-03-07 ENCOUNTER — Telehealth: Payer: Self-pay | Admitting: Critical Care Medicine

## 2011-03-07 NOTE — Telephone Encounter (Signed)
Called [facility] at [phone] for [place]. Member # [id number]. Patient has tried and failed Fluticasone in the past. Veramyst APPROVED from 03/07/2011 through 03/06/2012. Pharmacy aware.

## 2011-08-09 ENCOUNTER — Encounter: Payer: Self-pay | Admitting: Physician Assistant

## 2011-08-09 ENCOUNTER — Ambulatory Visit (INDEPENDENT_AMBULATORY_CARE_PROVIDER_SITE_OTHER): Payer: 59 | Admitting: Physician Assistant

## 2011-08-09 VITALS — BP 136/71 | HR 95 | Temp 97.7°F | Ht 63.0 in | Wt 167.0 lb

## 2011-08-09 DIAGNOSIS — J45909 Unspecified asthma, uncomplicated: Secondary | ICD-10-CM

## 2011-08-09 DIAGNOSIS — J302 Other seasonal allergic rhinitis: Secondary | ICD-10-CM

## 2011-08-09 DIAGNOSIS — J309 Allergic rhinitis, unspecified: Secondary | ICD-10-CM

## 2011-08-09 DIAGNOSIS — H9202 Otalgia, left ear: Secondary | ICD-10-CM

## 2011-08-09 DIAGNOSIS — H9209 Otalgia, unspecified ear: Secondary | ICD-10-CM

## 2011-08-09 MED ORDER — MONTELUKAST SODIUM 10 MG PO TABS: 10.0000 mg | ORAL_TABLET | Freq: Every day | ORAL | Status: DC

## 2011-08-09 MED ORDER — LEVALBUTEROL HCL 1.25 MG/3ML IN NEBU: 1.2500 mg | INHALATION_SOLUTION | Freq: Four times a day (QID) | RESPIRATORY_TRACT | Status: DC | PRN

## 2011-08-09 MED ORDER — DEXLANSOPRAZOLE 60 MG PO CPDR: 60.0000 mg | DELAYED_RELEASE_CAPSULE | Freq: Every day | ORAL | Status: DC

## 2011-08-09 MED ORDER — ALBUTEROL SULFATE HFA 108 (90 BASE) MCG/ACT IN AERS: 2.0000 | INHALATION_SPRAY | Freq: Four times a day (QID) | RESPIRATORY_TRACT | Status: DC | PRN

## 2011-08-09 NOTE — Progress Notes (Signed)
  Subjective:    Patient ID: Dominique [name], female    DOB: 03-04-1961, 51 y.o.   MRN: [id number]  HPI Patient presents to clinic with ear pain for 1 day. It does not hurt all the time but just sharp pains every 10 or so minutes in left ear only. She rates them pain a 5/10. She finishned a abx 1 month ago for ear infection. She uses Veramyst daily for nasal congestion. Denies fever, sore throat, sinus pressure, ear discharge, or cough. She has had a headache today but has not been having them regularly. 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 In the past she was on sinuglair and has bad allergies but was taken off of it last year.    Refilled medications for asthma and acid reflux. 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 SOB only happens at work when the fumes get bad.   Review of Systems     Objective:   Physical Exam  Constitutional: She is oriented to person, place, and time. She appears well-developed and well-nourished.  HENT:  Head: Normocephalic and atraumatic.  Right Ear: External ear normal.  Left Ear: External ear normal.  Nose: Nose normal.  Mouth/Throat: Oropharynx is clear and moist. No oropharyngeal exudate.       Tm's normal. Oropharynx red with post nasal drip present. Turbinates red and swollen. No maxillary tenderness.  Eyes:       Conjunctiva appreared irritated and red bilaterally.   Cardiovascular: Normal rate, regular rhythm and normal heart sounds.   Pulmonary/Chest: Effort normal and breath sounds normal. She has no wheezes.  Lymphadenopathy:    She has no cervical adenopathy.  Neurological: She is alert and oriented to person, place, and time.  Psychiatric: She has a normal mood and affect. Her behavior is normal.          Assessment & Plan:  Allergies/Asthma-Restart Sinuglair. Continue on nasal  spray. If you start to feel congested then Mucinex twice a day just make sure lot's water.   Left ear pain- suspected due to allergies and TMJ pain. Use Ibuprofen for TMJ pain. Prescribed singulair to restart for allergies and asthma. If pain gets worse or more constant then come in recheck ear to make sure no infection has set in. Ibuprofen or Tylenol for TMJ pain.

## 2011-08-09 NOTE — Patient Instructions (Signed)
Restart Sinuglair. Continue on nasal spray. If you start to feel congested then Mucinex twice a day just make sure lot's water. If pain gets worse or more constant then come in recheck ear to make sure no infection has set in. Ibuprofen or Tylenol for TMJ pain.

## 2011-09-12 ENCOUNTER — Ambulatory Visit (INDEPENDENT_AMBULATORY_CARE_PROVIDER_SITE_OTHER): Payer: 59 | Admitting: Physician Assistant

## 2011-09-12 ENCOUNTER — Encounter: Payer: Self-pay | Admitting: Physician Assistant

## 2011-09-12 VITALS — BP 129/79 | HR 96 | Temp 98.7°F | Ht 63.0 in | Wt 171.0 lb

## 2011-09-12 DIAGNOSIS — J45909 Unspecified asthma, uncomplicated: Secondary | ICD-10-CM

## 2011-09-12 DIAGNOSIS — M79642 Pain in left hand: Secondary | ICD-10-CM

## 2011-09-12 DIAGNOSIS — J029 Acute pharyngitis, unspecified: Secondary | ICD-10-CM

## 2011-09-12 DIAGNOSIS — M79609 Pain in unspecified limb: Secondary | ICD-10-CM

## 2011-09-12 DIAGNOSIS — J302 Other seasonal allergic rhinitis: Secondary | ICD-10-CM

## 2011-09-12 DIAGNOSIS — J309 Allergic rhinitis, unspecified: Secondary | ICD-10-CM

## 2011-09-12 MED ORDER — ALBUTEROL SULFATE HFA 108 (90 BASE) MCG/ACT IN AERS: 2.0000 | INHALATION_SPRAY | Freq: Four times a day (QID) | RESPIRATORY_TRACT | Status: DC | PRN

## 2011-09-12 MED ORDER — MONTELUKAST SODIUM 10 MG PO TABS: 10.0000 mg | ORAL_TABLET | Freq: Every day | ORAL | Status: DC

## 2011-09-12 MED ORDER — ESTRADIOL 1 MG PO TABS: 1.0000 mg | ORAL_TABLET | Freq: Every day | ORAL | Status: DC

## 2011-09-12 MED ORDER — LOSARTAN POTASSIUM 100 MG PO TABS: 100.0000 mg | ORAL_TABLET | Freq: Every day | ORAL | Status: DC

## 2011-09-12 MED ORDER — DEXLANSOPRAZOLE 60 MG PO CPDR: 60.0000 mg | DELAYED_RELEASE_CAPSULE | Freq: Every day | ORAL | Status: DC

## 2011-09-12 MED ORDER — LEVALBUTEROL HCL 1.25 MG/3ML IN NEBU: 1.2500 mg | INHALATION_SOLUTION | Freq: Four times a day (QID) | RESPIRATORY_TRACT | Status: DC | PRN

## 2011-09-12 MED ORDER — METHYLPREDNISOLONE 4 MG PO KIT: PACK | ORAL | Status: AC

## 2011-09-12 NOTE — Progress Notes (Signed)
  Subjective:    Patient ID: Dominique [name], female    DOB: January 17, 1961, 51 y.o.   MRN: [id number]  HPI Patient presents because ear has started to hurt again, sore throat, and having to use inhaler more. She does not have a fever. She had to use inhaler yesterday for SOB and chest tightness. It did help. She has been having some sinus pressure with headache. Both of her ear hurt without dischare. She has been taken [facility] anti-histamines daily along with nasal spray. She also uses singulair daily. She has been having joint pain in both hands. Her hands have started to fill stiff. She does use her hands a lot with her work. She does not take anything for pain.    Review of Systems     Objective:   Physical Exam  Constitutional: She is oriented to person, place, and time. She appears well-developed and well-nourished.  HENT:  Head: Normocephalic and atraumatic.  Right Ear: External ear normal.  Left Ear: External ear normal.       Tm's normal with some erythema around ossicles. Negative for maxillary tenderness. Oropharynx is red without exudate. Post nasal drip was present on back of orpharynx. Turbinates red and swollen.  Eyes: Conjunctivae are normal.  Neck: Normal range of motion. Neck supple.       Bilateral ant cervical lymphnode enlargement.  Cardiovascular: Normal rate, regular rhythm and normal heart sounds.   Pulmonary/Chest: Effort normal. She has wheezes.       Wheezing heard at base of both lungs.  Musculoskeletal:       No joint tenderness with palpation of either hand. Right pinky slightly more swollen than right pinky. Strength 5/5.   Lymphadenopathy:    She has cervical adenopathy.  Neurological: She is alert and oriented to person, place, and time.  Skin: Skin is warm and dry.  Psychiatric: She has a normal mood and affect. Her behavior is normal.          Assessment & Plan:  Sore throat/Allergies- Rapid strep negative. Start mucinex twice a day. Continue to  drink lots of water. Take ibuprofen for sore throat. Gave sample of neil med sinus rinse to use once or twice a day. Call if not improving. Continue on nasal sprays.   Asthma/Sinus pressure- Sp02 was 96%. Will give short course of steroids. Continue to use alb inhaler as needed. If continue to use regulary we need to consider a daily inhaler and will need to come in for follow up.  Pain in joint of both hands- Likely osteoarthritis take ibuprofen for joint pain. Continue to stay moving.

## 2011-09-12 NOTE — Patient Instructions (Signed)
Start mucinex twice a day. Drinking lots of water. Can take ibuprofen for sore throat and joint pain. Use neilmed sinus rinse once or twice a day. Will give short course of steroid for SOB and headache.

## 2011-09-16 ENCOUNTER — Telehealth: Payer: Self-pay | Admitting: *Deleted

## 2011-09-16 MED ORDER — AMOXICILLIN-POT CLAVULANATE 875-125 MG PO TABS: 1.0000 | ORAL_TABLET | Freq: Two times a day (BID) | ORAL | Status: AC

## 2011-09-16 NOTE — Telephone Encounter (Signed)
Pt notified med sent to pharmacy via VM. KJ LPN

## 2011-09-16 NOTE — Telephone Encounter (Signed)
Sent to pharmacy Augmentin to take twice a day for 10 days.

## 2011-09-16 NOTE — Telephone Encounter (Signed)
Pt calls and states was seen on Monday and told to call back if no better. States still has a headache, ? Sinus infection. Please advise.

## 2011-09-19 ENCOUNTER — Telehealth: Payer: Self-pay | Admitting: *Deleted

## 2011-09-19 MED ORDER — FLUCONAZOLE 150 MG PO TABS: 150.0000 mg | ORAL_TABLET | Freq: Once | ORAL | Status: AC

## 2011-09-19 NOTE — Telephone Encounter (Signed)
Sent to pharmacy 

## 2011-09-19 NOTE — Telephone Encounter (Signed)
Pt calls and states that she was on antibiotics last week and now has a yeast infection and wants to know if you would send in D[facility]lucan [facility] to [facility] [address] in [place]

## 2011-11-02 ENCOUNTER — Telehealth: Payer: Self-pay | Admitting: *Deleted

## 2011-11-02 ENCOUNTER — Ambulatory Visit (HOSPITAL_BASED_OUTPATIENT_CLINIC_OR_DEPARTMENT_OTHER)
Admission: RE | Admit: 2011-11-02 | Discharge: 2011-11-02 | Disposition: A | Discharge status: Home / Self Care | Payer: 59 | Source: Ambulatory Visit | Attending: Family Medicine | Admitting: Family Medicine

## 2011-11-02 DIAGNOSIS — M79673 Pain in unspecified foot: Secondary | ICD-10-CM

## 2011-11-02 DIAGNOSIS — M79609 Pain in unspecified limb: Secondary | ICD-10-CM | POA: Insufficient documentation

## 2011-11-02 DIAGNOSIS — I10 Essential (primary) hypertension: Secondary | ICD-10-CM

## 2011-11-02 DIAGNOSIS — M7730 Calcaneal spur, unspecified foot: Secondary | ICD-10-CM | POA: Insufficient documentation

## 2011-11-02 NOTE — Telephone Encounter (Signed)
LMOM for pt to go to [facility] for xray and to come by and pickup lab orders.

## 2011-11-02 NOTE — Telephone Encounter (Signed)
I will order xray. Which foot?  I put in order for labs and printed. Will need to pick up slip and take with her to [facility] location.

## 2011-11-02 NOTE — Telephone Encounter (Signed)
Pt states when she saw Lesly Rubenstein a couple months ago that [place] told her that we could xr her foot due to the swelling and it being painful when she steps down. I didn't see any note about the foot, just about the hands. Pt is requesting to get an xr done at [facility] since she is closer to them. She also states that she needs her labwork done that includes kidney function, GFR and lipid. States she needs rx to have it done at [facility] b/c it is free of charge for her since her husband works for them. Please advise.

## 2011-11-02 NOTE — Telephone Encounter (Signed)
Sorry. It is left foot.

## 2011-11-02 NOTE — Telephone Encounter (Signed)
PUt order in for xray at [place]. She can go anytime during business hours.

## 2011-11-03 ENCOUNTER — Other Ambulatory Visit: Payer: Self-pay | Admitting: Family Medicine

## 2011-11-03 DIAGNOSIS — M7730 Calcaneal spur, unspecified foot: Secondary | ICD-10-CM

## 2011-11-03 DIAGNOSIS — M79672 Pain in left foot: Secondary | ICD-10-CM

## 2011-11-15 ENCOUNTER — Telehealth: Payer: Self-pay | Admitting: *Deleted

## 2011-11-15 NOTE — Telephone Encounter (Signed)
Pt has called to see if you have received the results of her labwork that was done at [facility]. If so, we need to get them faxed to her at [phone]. Please advise.

## 2011-11-16 NOTE — Telephone Encounter (Signed)
Have results: Kidneys look great! Liver one enzyme is slightly elevated, will recheck in 6 months to make sure not progressing, TG are high at 248 goal under 150, LDL is under 100 which is at goal. I don't think we need to start any medication at this point. I really think if you limited your carbs and sugars along with saturated fats diet and exercise that TG number might decrease. If you work on these we can recheck in 6 months and then talk about meds at that point if not decreasing.  Please copy labs and fax to pt.

## 2011-11-16 NOTE — Telephone Encounter (Signed)
Results faxed per pt request and sent to be scanned.

## 2011-12-09 ENCOUNTER — Encounter: Payer: Self-pay | Admitting: Family Medicine

## 2012-03-15 ENCOUNTER — Encounter: Payer: Self-pay | Admitting: Family Medicine

## 2012-03-15 ENCOUNTER — Telehealth: Payer: Self-pay | Admitting: Physician Assistant

## 2012-03-15 ENCOUNTER — Ambulatory Visit (INDEPENDENT_AMBULATORY_CARE_PROVIDER_SITE_OTHER): Payer: 59 | Admitting: Family Medicine

## 2012-03-15 VITALS — BP 120/82 | HR 93 | Temp 98.4°F | Wt 178.2 lb

## 2012-03-15 DIAGNOSIS — J329 Chronic sinusitis, unspecified: Secondary | ICD-10-CM

## 2012-03-15 MED ORDER — FLUTICASONE FUROATE 27.5 MCG/SPRAY NA SUSP: 2.0000 | Freq: Every day | NASAL | Status: DC

## 2012-03-15 MED ORDER — AMOXICILLIN-POT CLAVULANATE 875-125 MG PO TABS: 1.0000 | ORAL_TABLET | Freq: Two times a day (BID) | ORAL | Status: DC

## 2012-03-15 MED ORDER — FLUCONAZOLE 150 MG PO TABS: ORAL_TABLET | ORAL | Status: DC

## 2012-03-15 NOTE — Patient Instructions (Addendum)

## 2012-03-15 NOTE — Telephone Encounter (Signed)
Can either of you please call her back today for me please  Thanks

## 2012-03-15 NOTE — Addendum Note (Signed)
Addended by: Lelon Perla on: 03/15/2012 05:36 PM   Modules accepted: Orders

## 2012-03-15 NOTE — Progress Notes (Signed)
  Subjective:     Dominique [name] is a 51 y.o. female who presents for evaluation of sinus pain. Symptoms include: congestion, facial pain, nasal congestion and sinus pressure. Onset of symptoms was a few weeks ago. Symptoms have been gradually worsening since that time. Past history is significant for asthma. Patient is a non-smoker.  The following portions of the patient's history were reviewed and updated as appropriate: allergies, current medications, past family history, past medical history, past social history, past surgical history and problem list.  Review of Systems Pertinent items are noted in HPI.   Objective:    BP 120/82  Pulse 93  Temp 98.4 F (36.9 C) (Oral)  Wt 178 lb 3.2 oz (80.831 kg)  SpO2 95% General appearance: alert, cooperative, appears stated age and no distress Ears: L tm errythematous Nose: green discharge, moderate congestion, turbinates red, swollen, sinus tenderness bilateral Throat: abnormal findings: mild oropharyngeal erythema Neck: mild anterior cervical adenopathy, supple, symmetrical, trachea midline and thyroid not enlarged, symmetric, no tenderness/mass/nodules Lungs: clear to auscultation bilaterally    Assessment:    Acute bacterial sinusitis.   exac asthma Plan:    Nasal steroids per medication orders. Antihistamines per medication orders. Augmentin per medication orders.  qvar 40 mg  con't albuterol prn  call or rto prn

## 2012-03-15 NOTE — Telephone Encounter (Signed)
PCP change request --patient wants to switch back to [facility] to see dr.lowne if possible today  Pt states she switched originally because we got to busy here and now she wants to switch back as she works down the street.  Pt states she has an ear ache & needs to be see today As per office protocol I have to ask if it is ok for patient to change from one [place] to another Pleas review and advise Cb# [phone] (cell (Work)  [phone]

## 2012-03-15 NOTE — Telephone Encounter (Signed)
Pt scheduled for today at 4:15. Ok per Dr. Laury Axon.

## 2012-03-15 NOTE — Telephone Encounter (Signed)
Ok to switch 

## 2012-03-15 NOTE — Telephone Encounter (Signed)
Left message for patient to return our call.

## 2012-04-16 ENCOUNTER — Telehealth: Payer: Self-pay | Admitting: Family Medicine

## 2012-04-16 MED ORDER — LOSARTAN POTASSIUM 100 MG PO TABS: 100.0000 mg | ORAL_TABLET | Freq: Every day | ORAL | Status: DC

## 2012-04-16 MED ORDER — MONTELUKAST SODIUM 10 MG PO TABS: 10.0000 mg | ORAL_TABLET | Freq: Every day | ORAL | Status: DC

## 2012-04-16 MED ORDER — ALBUTEROL SULFATE HFA 108 (90 BASE) MCG/ACT IN AERS: 2.0000 | INHALATION_SPRAY | Freq: Four times a day (QID) | RESPIRATORY_TRACT | Status: DC | PRN

## 2012-04-16 NOTE — Telephone Encounter (Signed)
Refill: Montelukast tab Losartan tab Ventolin hfa aer *Requesting 90 day supply*

## 2012-04-16 NOTE — Addendum Note (Signed)
Addended by: Arnette Norris on: 04/16/2012 11:10 AM   Modules accepted: Orders

## 2012-04-19 ENCOUNTER — Other Ambulatory Visit: Payer: Self-pay | Admitting: *Deleted

## 2012-04-19 MED ORDER — FLUTICASONE FUROATE 27.5 MCG/SPRAY NA SUSP: 2.0000 | Freq: Every day | NASAL | Status: DC

## 2012-04-19 NOTE — Addendum Note (Signed)
Addended by: Candie Echevaria L on: 04/19/2012 08:56 AM   Modules accepted: Orders

## 2012-04-19 NOTE — Telephone Encounter (Signed)
Rx sent 

## 2012-05-22 ENCOUNTER — Ambulatory Visit (INDEPENDENT_AMBULATORY_CARE_PROVIDER_SITE_OTHER): Payer: 59

## 2012-05-22 DIAGNOSIS — Z23 Encounter for immunization: Secondary | ICD-10-CM

## 2012-07-11 ENCOUNTER — Telehealth: Payer: Self-pay | Admitting: Family Medicine

## 2012-07-11 NOTE — Telephone Encounter (Signed)
Patient sent [place] rx request via fax. Please send new rx for dexilant 60 mg and estradiol 1 mg to [facility]. She is requesting a call when this has been sent. CB# [phone]

## 2012-07-11 NOTE — Telephone Encounter (Signed)
Ok to refill meds for 6 months but should have cpe then

## 2012-07-11 NOTE — Telephone Encounter (Signed)
Dr.Lowne this patient was seen as an acute, no pending apts she stated she thought she was re-establishing with you at the time of her visit. No documentation of this. Please advise if refills are appropriate. The patient see's a GYN (Dr.Neale) and declined CPE. Please advise      KP

## 2012-07-12 MED ORDER — ESTRADIOL 1 MG PO TABS: 1.0000 mg | ORAL_TABLET | Freq: Every day | ORAL | Status: DC

## 2012-07-12 MED ORDER — DEXLANSOPRAZOLE 60 MG PO CPDR: 60.0000 mg | DELAYED_RELEASE_CAPSULE | Freq: Every day | ORAL | Status: DC

## 2012-08-09 ENCOUNTER — Ambulatory Visit (INDEPENDENT_AMBULATORY_CARE_PROVIDER_SITE_OTHER): Payer: 59 | Admitting: Family Medicine

## 2012-08-09 ENCOUNTER — Encounter: Payer: Self-pay | Admitting: Family Medicine

## 2012-08-09 VITALS — BP 120/76 | HR 90 | Temp 98.2°F | Wt 174.0 lb

## 2012-08-09 DIAGNOSIS — J45901 Unspecified asthma with (acute) exacerbation: Secondary | ICD-10-CM

## 2012-08-09 MED ORDER — PREDNISONE 10 MG PO TABS: ORAL_TABLET | ORAL | Status: DC

## 2012-08-09 NOTE — Progress Notes (Signed)
  Subjective:     Dominique [name] is an 52 y.o. female who presents for follow up of asthma. The patient is currently having symptoms / an exacerbation. Current symptoms include chest tightness, dyspnea, non-productive cough and wheezing. Symptoms have been present since several days ago and have been gradually worsening. She denies chest pain and productive cough. Associated symptoms include chest pain, poor exercise tolerance, shortness of breath and wheezing.  This episode appears to have been triggered by fumes. Treatments tried for the current exacerbation include leukotriene inhibitors and short-acting inhaled beta-adrenergic agonists, which have provided some relief of symptoms. The patient has been having similar episodes for approximately 1 week.  Current Disease Severity [name] has frequent daytime asthma symptoms. She has regular nightly symptoms this week. The patient is using short-acting beta agonists for symptom control several times per day. She has exacerbations requiring oral systemic corticosteroids 1 times per year. Current limitations in activity from asthma: any activity or fumes, scents etc. Number of days of school or work missed in the last month: 0. Number of urgent/emergent visit in last year: 0   The following portions of the patient's history were reviewed and updated as appropriate: allergies, current medications, past family history, past medical history, past social history, past surgical history and problem list.  Review of Systems Pertinent items are noted in HPI.    Objective:    Oxygen saturation 98% on room air BP 120/76  Pulse 90  Temp(Src) 98.2 F (36.8 C) (Oral)  Wt 174 lb (78.926 kg)  BMI 30.83 kg/m2  SpO2 98% General appearance: alert, cooperative, appears stated age and mild distress Throat: lips, mucosa, and tongue normal; teeth and gums normal Neck: no adenopathy, supple, symmetrical, trachea midline and thyroid not enlarged, symmetric, no  tenderness/mass/nodules Lungs: wheezes bilaterally Heart: S1, S2 normal    Assessment:    Mild persistent asthma, ongoing.     Plan:    Medications: resume qvar 40 mg. Discussed distinction between quick-relief and controlled medications. Discussed medication dosage, use, side effects, and goals of treatment in detail.   Warning signs of respiratory distress were reviewed with the patient.  Reduce exposure to inhaled allergens: pt aware. Asthma information handout given. consider pulm referral if symptoms do not improve  pred taper

## 2012-08-09 NOTE — Patient Instructions (Signed)
Asthma Attack Prevention  HOW CAN ASTHMA BE PREVENTED?  Currently, there is no way to prevent asthma from starting. However, you can take steps to control the disease and prevent its symptoms after you have been diagnosed. Learn about your asthma and how to control it. Take an active role to control your asthma by working with your caregiver to create and follow an asthma action plan. An asthma action plan guides you in taking your medicines properly, avoiding factors that make your asthma worse, tracking your level of asthma control, responding to worsening asthma, and seeking emergency care when needed. To track your asthma, keep records of your symptoms, check your peak flow number using a peak flow meter (handheld device that shows how well air moves out of your lungs), and get regular asthma checkups.   Other ways to prevent asthma attacks include:   Use medicines as your caregiver directs.   Identify and avoid things that make your asthma worse (as much as you can).   Keep track of your asthma symptoms and level of control.   Get regular checkups for your asthma.   With your caregiver, write a detailed plan for taking medicines and managing an asthma attack. Then be sure to follow your action plan. Asthma is an ongoing condition that needs regular monitoring and treatment.   Identify and avoid asthma triggers. A number of outdoor allergens and irritants (pollen, mold, cold air, air pollution) can trigger asthma attacks. Find out what causes or makes your asthma worse, and take steps to avoid those triggers (see below).   Monitor your breathing. Learn to recognize warning signs of an attack, such as slight coughing, wheezing or shortness of breath. However, your lung function may already decrease before you notice any signs or symptoms, so regularly measure and record your peak airflow with a home peak flow meter.   Identify and treat attacks early. If you act quickly, you're less likely to have a  severe attack. You will also need less medicine to control your symptoms. When your peak flow measurements decrease and alert you to an upcoming attack, take your medicine as instructed, and immediately stop any activity that may have triggered the attack. If your symptoms do not improve, get medical help.   Pay attention to increasing quick-relief inhaler use. If you find yourself relying on your quick-relief inhaler (such as albuterol), your asthma is not under control. See your caregiver about adjusting your treatment.  IDENTIFY AND CONTROL FACTORS THAT MAKE YOUR ASTHMA WORSE  A number of common things can set off or make your asthma symptoms worse (asthma triggers). Keep track of your asthma symptoms for several weeks, detailing all the environmental and emotional factors that are linked with your asthma. When you have an asthma attack, go back to your asthma diary to see which factor, or combination of factors, might have contributed to it. Once you know what these factors are, you can take steps to control many of them.   Allergies: If you have allergies and asthma, it is important to take asthma prevention steps at home. Asthma attacks (worsening of asthma symptoms) can be triggered by allergies, which can cause temporary increased inflammation of your airways. Minimizing contact with the substance to which you are allergic will help prevent an asthma attack.  Animal Dander:    Some people are allergic to the flakes of skin or dried saliva from animals with fur or feathers. Keep these pets out of your home.   If   you can't keep a pet outdoors, keep the pet out of your bedroom and other sleeping areas at all times, and keep the door closed.   Remove carpets and furniture covered with cloth from your home. If that is not possible, keep the pet away from fabric-covered furniture and carpets.  Dust Mites:   Many people with asthma are allergic to dust mites. Dust mites are tiny bugs that are found in every  home, in mattresses, pillows, carpets, fabric-covered furniture, bedcovers, clothes, stuffed toys, fabric, and other fabric-covered items.   Cover your mattress in a special dust-proof cover.   Cover your pillow in a special dust-proof cover, or wash the pillow each week in hot water. Water must be hotter than 130 F to kill dust mites. Cold or warm water used with detergent and bleach can also be effective.   Wash the sheets and blankets on your bed each week in hot water.   Try not to sleep or lie on cloth-covered cushions.   Call ahead when traveling and ask for a smoke-free hotel room. Bring your own bedding and pillows, in case the hotel only supplies feather pillows and down comforters, which may contain dust mites and cause asthma symptoms.   Remove carpets from your bedroom and those laid on concrete, if you can.   Keep stuffed toys out of the bed, or wash the toys weekly in hot water or cooler water with detergent and bleach.  Cockroaches:   Many people with asthma are allergic to the droppings and remains of cockroaches.   Keep food and garbage in closed containers. Never leave food out.   Use poison baits, traps, powders, gels, or paste (for example, boric acid).   If a spray is used to kill cockroaches, stay out of the room until the odor goes away.  Indoor Mold:   Fix leaky faucets, pipes, or other sources of water that have mold around them.   Clean moldy surfaces with a cleaner that has bleach in it.  Pollen and Outdoor Mold:   When pollen or mold spore counts are high, try to keep your windows closed.   Stay indoors with windows closed from late morning to afternoon, if you can. Pollen and some mold spore counts are highest at that time.   Ask your caregiver whether you need to take or increase anti-inflammatory medicine before your allergy season starts.  Irritants:    Tobacco smoke is an irritant. If you smoke, ask your caregiver how you can quit. Ask family members to quit  smoking, too. Do not allow smoking in your home or car.   If possible, do not use a wood-burning stove, kerosene heater, or fireplace. Minimize exposure to all sources of smoke, including incense, candles, fires, and fireworks.   Try to stay away from strong odors and sprays, such as perfume, talcum powder, hair spray, and paints.   Decrease humidity in your home and use an indoor air cleaning device. Reduce indoor humidity to below 60 percent. Dehumidifiers or central air conditioners can do this.   Try to have someone else vacuum for you once or twice a week, if you can. Stay out of rooms while they are being vacuumed and for a short while afterward.   If you vacuum, use a dust mask from a hardware store, a double-layered or microfilter vacuum cleaner bag, or a vacuum cleaner with a HEPA filter.   Sulfites in foods and beverages can be irritants. Do not drink beer or   wine, or eat dried fruit, processed potatoes, or shrimp if they cause asthma symptoms.   Cold air can trigger an asthma attack. Cover your nose and mouth with a scarf on cold or windy days.   Several health conditions can make asthma more difficult to manage, including runny nose, sinus infections, reflux disease, psychological stress, and sleep apnea. Your caregiver will treat these conditions, as well.   Avoid close contact with people who have a cold or the flu, since your asthma symptoms may get worse if you catch the infection from them. Wash your hands thoroughly after touching items that may have been handled by people with a respiratory infection.   Get a flu shot every year to protect against the flu virus, which often makes asthma worse for days or weeks. Also get a pneumonia shot once every five to 10 years.  Drugs:   Aspirin and other painkillers can cause asthma attacks. 10% to 20% of people with asthma have sensitivity to aspirin or a group of painkillers called non-steroidal anti-inflammatory drugs (NSAIDS), such as ibuprofen  and naproxen. These drugs are used to treat pain and reduce fevers. Asthma attacks caused by any of these medicines can be severe and even fatal. These drugs must be avoided in people who have known aspirin sensitive asthma. Products with acetaminophen are considered safe for people who have asthma. It is important that people with aspirin sensitivity read labels of all over-the-counter drugs used to treat pain, colds, coughs, and fever.   Beta blockers and ACE inhibitors are other drugs which you should discuss with your caregiver, in relation to your asthma.  ALLERGY SKIN TESTING   Ask your asthma caregiver about allergy skin testing or blood testing (RAST test) to identify the allergens to which you are sensitive. If you are found to have allergies, allergy shots (immunotherapy) for asthma may help prevent future allergies and asthma. With allergy shots, small doses of allergens (substances to which you are allergic) are injected under your skin on a regular schedule. Over a period of time, your body may become used to the allergen and less responsive with asthma symptoms. You can also take measures to minimize your exposure to those allergens.  EXERCISE   If you have exercise-induced asthma, or are planning vigorous exercise, or exercise in cold, humid, or dry environments, prevent exercise-induced asthma by following your caregiver's advice regarding asthma treatment before exercising.  Document Released: 04/20/2009 Document Revised: 07/25/2011 Document Reviewed: 04/20/2009  [employer] Patient Information 2013 [employer].

## 2012-11-07 ENCOUNTER — Telehealth: Payer: Self-pay | Admitting: *Deleted

## 2012-11-07 MED ORDER — LOSARTAN POTASSIUM 100 MG PO TABS: 100.0000 mg | ORAL_TABLET | Freq: Every day | ORAL | Status: DC

## 2012-11-07 MED ORDER — MONTELUKAST SODIUM 10 MG PO TABS: 10.0000 mg | ORAL_TABLET | Freq: Every day | ORAL | Status: DC

## 2012-11-07 NOTE — Telephone Encounter (Signed)
Rx placed on ledge awaiting signature

## 2012-11-08 ENCOUNTER — Other Ambulatory Visit: Payer: Self-pay | Admitting: Family Medicine

## 2012-11-08 NOTE — Telephone Encounter (Signed)
Fax received from Pt stating: Please write ne a 30 day for each of the above and then write out the 3 month orders for me to send to my prescription mail order. I will pick up the 30 day order at your office when you let me know it is ready. Please do not send the mail order one. I will send it in when my 30 supply is low. Copy of letter scan to chart. Per Dois Davenport Pt does not need to send request via fax Pt needs to contact pharmacy or call the office. Pt given letter explaining protocol for refill request. Tried to call Pt no answer and VM not set up will try again later.

## 2012-11-09 NOTE — Telephone Encounter (Signed)
Left Pt detail VM that Rx are ready for pick up. Rx placed up front

## 2012-12-31 ENCOUNTER — Other Ambulatory Visit: Payer: Self-pay | Admitting: *Deleted

## 2012-12-31 MED ORDER — LOSARTAN POTASSIUM 100 MG PO TABS: 100.0000 mg | ORAL_TABLET | Freq: Every day | ORAL | Status: DC

## 2012-12-31 MED ORDER — MONTELUKAST SODIUM 10 MG PO TABS: 10.0000 mg | ORAL_TABLET | Freq: Every day | ORAL | Status: DC

## 2012-12-31 NOTE — Telephone Encounter (Signed)
Rx was refilled and sent to [facility] as requested by patient.  Ag cma

## 2013-01-14 ENCOUNTER — Other Ambulatory Visit: Payer: Self-pay | Admitting: Family Medicine

## 2013-01-31 ENCOUNTER — Emergency Department (HOSPITAL_BASED_OUTPATIENT_CLINIC_OR_DEPARTMENT_OTHER): Payer: 59

## 2013-01-31 ENCOUNTER — Emergency Department (HOSPITAL_BASED_OUTPATIENT_CLINIC_OR_DEPARTMENT_OTHER)
Admission: EM | Admit: 2013-01-31 | Discharge: 2013-01-31 | Disposition: A | Discharge status: Home / Self Care | Payer: 59 | Attending: Emergency Medicine | Admitting: Emergency Medicine

## 2013-01-31 ENCOUNTER — Encounter (HOSPITAL_BASED_OUTPATIENT_CLINIC_OR_DEPARTMENT_OTHER): Payer: Self-pay

## 2013-01-31 DIAGNOSIS — W19XXXA Unspecified fall, initial encounter: Secondary | ICD-10-CM | POA: Insufficient documentation

## 2013-01-31 DIAGNOSIS — Z87442 Personal history of urinary calculi: Secondary | ICD-10-CM | POA: Insufficient documentation

## 2013-01-31 DIAGNOSIS — S52123A Displaced fracture of head of unspecified radius, initial encounter for closed fracture: Secondary | ICD-10-CM | POA: Insufficient documentation

## 2013-01-31 DIAGNOSIS — Y9229 Other specified public building as the place of occurrence of the external cause: Secondary | ICD-10-CM | POA: Insufficient documentation

## 2013-01-31 DIAGNOSIS — I10 Essential (primary) hypertension: Secondary | ICD-10-CM | POA: Insufficient documentation

## 2013-01-31 DIAGNOSIS — Z8619 Personal history of other infectious and parasitic diseases: Secondary | ICD-10-CM | POA: Insufficient documentation

## 2013-01-31 DIAGNOSIS — S52122A Displaced fracture of head of left radius, initial encounter for closed fracture: Secondary | ICD-10-CM

## 2013-01-31 DIAGNOSIS — Y9389 Activity, other specified: Secondary | ICD-10-CM | POA: Insufficient documentation

## 2013-01-31 DIAGNOSIS — J45909 Unspecified asthma, uncomplicated: Secondary | ICD-10-CM | POA: Insufficient documentation

## 2013-01-31 DIAGNOSIS — Z792 Long term (current) use of antibiotics: Secondary | ICD-10-CM | POA: Insufficient documentation

## 2013-01-31 DIAGNOSIS — Z8742 Personal history of other diseases of the female genital tract: Secondary | ICD-10-CM | POA: Insufficient documentation

## 2013-01-31 DIAGNOSIS — IMO0002 Reserved for concepts with insufficient information to code with codable children: Secondary | ICD-10-CM | POA: Insufficient documentation

## 2013-01-31 DIAGNOSIS — K219 Gastro-esophageal reflux disease without esophagitis: Secondary | ICD-10-CM | POA: Insufficient documentation

## 2013-01-31 DIAGNOSIS — Z79899 Other long term (current) drug therapy: Secondary | ICD-10-CM | POA: Insufficient documentation

## 2013-01-31 MED ORDER — OXYCODONE-ACETAMINOPHEN 5-325 MG PO TABS
1.0000 | ORAL_TABLET | Freq: Once | ORAL | Status: AC
  Administered 2013-01-31: 1 via ORAL
  Filled 2013-01-31 (×2): qty 1

## 2013-01-31 MED ORDER — OXYCODONE-ACETAMINOPHEN 5-325 MG PO TABS
1.0000 | ORAL_TABLET | Freq: Once | ORAL | Status: AC
  Administered 2013-01-31: 1 via ORAL
  Filled 2013-01-31: qty 1

## 2013-01-31 MED ORDER — OXYCODONE-ACETAMINOPHEN 5-325 MG PO TABS
ORAL_TABLET | ORAL | Status: AC
  Administered 2013-01-31: 1 via ORAL
  Filled 2013-01-31: qty 1

## 2013-01-31 MED ORDER — OXYCODONE-ACETAMINOPHEN 5-325 MG PO TABS: 2.0000 | ORAL_TABLET | ORAL | Status: DC | PRN

## 2013-01-31 NOTE — ED Notes (Signed)
Splint being applied  °

## 2013-01-31 NOTE — ED Notes (Signed)
Pt refused sling in triage-has ice pack in place upon arrival

## 2013-01-31 NOTE — ED Notes (Signed)
Pt states she wants pain medication before splint is put on.

## 2013-01-31 NOTE — ED Notes (Signed)
D/c home with ride- rx x 1 given for percocet- ice pack and sling given for home use

## 2013-01-31 NOTE — ED Provider Notes (Signed)
CSN: [id number]     Arrival date & time 01/31/13  2007 History  This chart was scribed for Rolan Bucco, MD by Greggory Stallion, ED Scribe. This patient was seen in room MH04/MH04 and the patient's care was started at 9:41 PM.   Chief Complaint  Patient presents with  . Arm Injury   The history is provided by the patient. No language interpreter was used.    HPI Comments: Dominique [name] is a 52 y.o. female who presents to the Emergency Department complaining of left elbow injury due to a fall earlier today. Pt is now experiencing left elbow pain and mild swelling. She states she fell at church and landed on her hands. Pt denies hitting her head or LOC. She denies shoulder pain, wrist pain, neck pain and back pain.   Past Medical History  Diagnosis Date  . Hypertension   . Endometriosis   . 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 Reactive airway disease   . Allergy     rhinitis  . Kidney stone   . GERD (gastroesophageal reflux disease)    Past Surgical History  Procedure Laterality Date  . Tonsillectomy and adenoidectomy  1967  . Uterine lining ablation  2002  . Laparatomy  1985   Family History  Problem Relation Age of Onset  . Diabetes Mother   . Hyperlipidemia Mother   . Hypertension Mother   . Diabetes Father   . Hyperlipidemia Father   . Hypertension Father    History  Substance Use Topics  . Smoking status: Never Smoker   . Smokeless tobacco: Not on file  . Alcohol Use: No   OB History   Grav Para Term Preterm Abortions TAB SAB Ect Mult Living                 Review of Systems  Constitutional: Negative for fever.  HENT: Negative for neck pain.   Gastrointestinal: Negative for nausea and vomiting.  Musculoskeletal: Positive for joint swelling and arthralgias. Negative for back pain.  Skin: Negative for wound.  Neurological: Negative for weakness, numbness and headaches.    Allergies  Review of patient's allergies indicates no known  allergies.  Home Medications   Current Outpatient Rx  Name  Route  Sig  Dispense  Refill  . albuterol (VENTOLIN HFA) 108 (90 BASE) MCG/ACT inhaler   Inhalation   Inhale 2 puffs into the lungs every 6 (six) hours as needed for wheezing or shortness of breath. For shortness of breath   3 Inhaler   0   . amoxicillin-clavulanate (AUGMENTIN) 875-125 MG per tablet   Oral   Take 1 tablet by mouth 2 (two) times daily.   20 tablet   0   . Chlorpheniramine Maleate 12 MG CPCR   Oral   Take by mouth 2 (two) times daily.           . Cholecalciferol (D-3-5) 5000 UNITS capsule   Oral   Take 5,000 Units by mouth daily.         [name] dexlansoprazole (DEXILANT) 60 MG capsule   Oral   Take 1 capsule (60 mg total) by mouth daily. Pt failed Nexium   90 capsule   1   . estradiol (ESTRACE) 1 MG tablet   Oral   Take 1 tablet (1 mg total) by mouth daily.   90 tablet   1   . fluconazole (DIFLUCAN) 150 MG tablet      1 po  qd x1, may repeat 3 days prn   2 tablet   2   . fluticasone (VERAMYST) 27.5 MCG/SPRAY nasal spray   Nasal   Place 2 sprays into the nose daily. Pt failed fluticasone   90 g   3   . fluticasone (VERAMYST) 27.5 MCG/SPRAY nasal spray   Nasal   Place 2 sprays into the nose daily. Pt failed fluticasone   90 g   3   . levalbuterol (XOPENEX) 1.25 MG/3ML nebulizer solution   Nebulization   Take 1.25 mg by nebulization every 6 (six) hours as needed for wheezing or shortness of breath. 3 ml nebs   72 mL   0   . losartan (COZAAR) 100 MG tablet   Oral   Take 1 tablet (100 mg total) by mouth daily.   90 tablet   1   . losartan (COZAAR) 100 MG tablet   Oral   Take 1 tablet (100 mg total) by mouth daily.   30 tablet   0   . losartan (COZAAR) 100 MG tablet      Take 1 tablet by mouth  daily   90 tablet   0   . montelukast (SINGULAIR) 10 MG tablet   Oral   Take 1 tablet (10 mg total) by mouth at bedtime.   90 tablet   1   . montelukast (SINGULAIR) 10 MG  tablet   Oral   Take 1 tablet (10 mg total) by mouth at bedtime.   30 tablet   0   . Nebulizer MISC   Does not apply   by Does not apply route. Dx: asthma          . oxyCODONE-acetaminophen (PERCOCET) 5-325 MG per tablet   Oral   Take 2 tablets by mouth every 4 (four) hours as needed for pain.   20 tablet   0   . predniSONE (DELTASONE) 10 MG tablet      3 po qd for 3 days then 2 po qd for 3 days the 1 po qd for 3 days   18 tablet   0    BP 130/76  Pulse 94  Temp(Src) 98 F (36.7 C) (Oral)  Resp 20  Ht 5\' 3"  (1.6 m)  Wt 170 lb (77.111 kg)  BMI 30.12 kg/m2  SpO2 100%  Physical Exam  Constitutional: She is oriented to person, place, and time. She appears well-developed and well-nourished.  HENT:  Head: Normocephalic and atraumatic.  Neck: Normal range of motion. Neck supple.  Cardiovascular: Normal rate.   Radial pulse intact.   Pulmonary/Chest: Effort normal.  Musculoskeletal: She exhibits edema and tenderness.  Mild swelling to the left elbow. Generalized tenderness to left elbow. No pain to shoulder or wrist. No overlying wounds.   Neurological: She is alert and oriented to person, place, and time.  Normal sensation and motor function in left hand.   Skin: Skin is warm and dry.  Psychiatric: She has a normal mood and affect.    ED Course  Procedures (including critical care time)  DIAGNOSTIC STUDIES: Oxygen Saturation is 100% on RA, normal by my interpretation.    COORDINATION OF CARE: 9:47 PM-Discussed treatment plan which includes splint and pain medication with pt at bedside and pt agreed to plan. Advised pt to follow up with orthopaedics.   Labs Review Labs Reviewed - No data to display Imaging Review Dg Elbow Complete Left  01/31/2013   CLINICAL DATA:  Elbow pain. History of fall. Axial  no priors.  EXAM: LEFT ELBOW - COMPLETE 3+ VIEW  COMPARISON:  No priors.  FINDINGS: There appears to be a fracture of the radial head, which is poorly demonstrated  on today's films because of atypical obliquity (per report from the technologist, the patient refused to position prior arm for standard views). Distal humerus and proximal ulna appear intact.  IMPRESSION: Subtle nondisplaced radial head fracture, likely with intra-articular extension.   Electronically Signed   By: Trudie Reed M.D.   On: 01/31/2013 21:14    MDM   1. Radial head fracture, closed, left, initial encounter    Patient was placed in a posterior splint. She was placed in a sling. She was advised in ice and elevation. She was given prescription for Percocet for pain. She was advised to use ibuprofen as well. She was given a referral to follow up with orthopedics and advised that she'll need close followup with orthopedic surgeon for reevaluation. She was given a referral to Dr. Charlann Boxer.      I personally performed the services described in this documentation, which was scribed in my presence.  The recorded information has been reviewed and considered.   Rolan Bucco, MD 01/31/13 2211

## 2013-01-31 NOTE — ED Notes (Signed)
Larey Seat taking out trash-pain to left elbow area

## 2013-02-08 ENCOUNTER — Encounter: Payer: Self-pay | Admitting: Family Medicine

## 2013-02-08 ENCOUNTER — Ambulatory Visit (INDEPENDENT_AMBULATORY_CARE_PROVIDER_SITE_OTHER): Payer: 59 | Admitting: Family Medicine

## 2013-02-08 VITALS — BP 126/68 | HR 87 | Temp 98.3°F | Wt 175.3 lb

## 2013-02-08 DIAGNOSIS — J45909 Unspecified asthma, uncomplicated: Secondary | ICD-10-CM

## 2013-02-08 DIAGNOSIS — J069 Acute upper respiratory infection, unspecified: Secondary | ICD-10-CM

## 2013-02-08 DIAGNOSIS — Z23 Encounter for immunization: Secondary | ICD-10-CM

## 2013-02-08 MED ORDER — DEXLANSOPRAZOLE 60 MG PO CPDR: 60.0000 mg | DELAYED_RELEASE_CAPSULE | Freq: Every day | ORAL | Status: DC

## 2013-02-08 MED ORDER — MONTELUKAST SODIUM 10 MG PO TABS: 10.0000 mg | ORAL_TABLET | Freq: Every day | ORAL | Status: DC

## 2013-02-08 MED ORDER — FLUTICASONE FUROATE 27.5 MCG/SPRAY NA SUSP: 2.0000 | Freq: Every day | NASAL | Status: DC

## 2013-02-08 MED ORDER — BECLOMETHASONE DIPROPIONATE 40 MCG/ACT IN AERS: 2.0000 | INHALATION_SPRAY | Freq: Two times a day (BID) | RESPIRATORY_TRACT | Status: DC

## 2013-02-08 MED ORDER — ALBUTEROL SULFATE HFA 108 (90 BASE) MCG/ACT IN AERS: 2.0000 | INHALATION_SPRAY | Freq: Four times a day (QID) | RESPIRATORY_TRACT | Status: DC | PRN

## 2013-02-08 NOTE — Patient Instructions (Signed)
Urinary Frequency °The number of times a normal person urinates depends upon how much liquid they take in and how much liquid they are losing. If the temperature is hot and there is high humidity then the person will sweat more and usually breathe a little more frequently. These factors decrease the amount of frequency of urination that would be considered normal. °The amount you drink is easily determined, but the amount of fluid lost is sometimes more difficult to calculate.  °Fluid is lost in two ways: °· Sensible fluid loss is usually measured by the amount of urine that you get rid of. Losses of fluid can also occur with diarrhea. °· Insensible fluid loss is more difficult to measure. It is caused by evaporation. Insensible loss of fluid occurs through breathing and sweating. It usually ranges from a little less than a quart to a little more than a quart of fluid a day. °In normal temperatures and activity levels the average person may urinate 4 to 7 times in a 24-hour period. Needing to urinate more often than that could indicate a problem. If one urinates 4 to 7 times in 24 hours and has large volumes each time, that could indicate a different problem from one who urinates 4 to 7 times a day and has small volumes. The time of urinating is also an important. Most urinating should be done during the waking hours. Getting up at night to urinate frequently can indicate some problems. °CAUSES  °The bladder is the organ in your lower abdomen that holds urine. Like a balloon, it swells some as it fills up. Your nerves sense this and tell you it is time to head for the bathroom. There are a number of reasons that you might feel the need to urinate more often than usual. They include: °· Urinary tract infection. This is usually associated with other signs such as burning when you urinate. °· In men, problems with the prostate (a walnut-size gland that is located near the tube that carries urine out of your body).  There are two reasons why the prostate can cause an increased frequency of urination: °· An enlarged prostate that does not let the bladder empty well. If the bladder only half empties when you urinate then it only has half the capacity to fill before you have to urinate again. °· The nerves in the bladder become more hypersensitive with an increased size of the prostate even if the bladder empties completely. °· Pregnancy. °· Obesity. Excess weight is more likely to cause a problem for women more than for men. °· Bladder stones or other bladder problems. °· Caffeine. °· Alcohol. °· Medications. For example, drugs that help the body get rid of extra fluid (diuretics) increase urine production. Some other medicines must be taken with lots of fluids. °· Muscle or nerve weakness. This might be the result of a spinal cord injury, a stroke, multiple sclerosis or Parkinson's disease. °· Long-standing diabetes can decrease the sensation of the bladder. This loss of sensation makes it harder to sense the bladder needs to be emptied. Over a period of years the bladder is stretched out by constant overfilling. This weakens the bladder muscles so that the bladder does not empty well and has less capacity to fill with new urine. °· Interstitial cystitis (also called painful bladder syndrome). This condition develops because the tissues that line the insider of the bladder are inflamed (inflammation is the body's way of reacting to injury or infection). It causes pain   and frequent urination. It occurs in women more often than in men. °DIAGNOSIS  °· To decide what might be causing your urinary frequency, your healthcare provider will probably: °· Ask about symptoms you have noticed. °· Ask about your overall health. This will include questions about any medications you are taking. °· Do a physical examination. °· Order some tests. These might include: °· A blood test to check for diabetes or other health issues that could be  contributing to the problem. °· Urine testing. This could measure the flow of urine and the pressure on the bladder. °· A test of your neurological system (the brain, spinal cord and nerves). This is the system that senses the need to urinate. °· A bladder test to check whether it is emptying completely when you urinate. °· Cytoscopy. This test uses a thin tube with a tiny camera on it. It offers a look inside your urethra and bladder to see if there are problems. °· Imaging tests. You might be given a contrast dye and then asked to urinate. X-rays are taken to see how your bladder is working. °TREATMENT  °It is important for you to be evaluated to determine if the amount or frequency that you have is unusual or abnormal. If it is found to be abnormal the cause should be determined and this can usually be found out easily. Depending upon the cause treatment could include medication, stimulation of the nerves, or surgery. °There are not too many things that you can do as an individual to change your urinary frequency. It is important that you balance the amount of fluid intake needed to compensate for your activity and the temperature. Medical problems will be diagnosed and taken care of by your physician. There is no particular bladder training such as Kegel's exercises that you can do to help urinary frequency. This is an exercise this is usually done for people who have leaking of urine when they laugh cough or sneeze. °HOME CARE INSTRUCTIONS  °· Take any medications your healthcare provider prescribed or suggested. Follow the directions carefully. °· Practice any lifestyle changes that are recommended. These might include: °· Drinking less fluid or drinking at different times of the day. If you need to urinate often during the night, for example, you may need to stop drinking fluids early in the evening. °· Cutting down on caffeine or alcohol. They both can make you need to urinate more often than normal.  Caffeine is found in coffee, tea and sodas. °· Losing weight, if that is recommended. °· Keep a journal or a log. You might be asked to record how much you drink and when and when you feel the need to urinate. This will also help evaluate how well the treatment provided by your physician is working. °SEEK MEDICAL CARE IF:  °· Your need to urinate often gets worse. °· You feel increased pain or irritation when you urinate. °· You notice blood in your urine. °· You have questions about any medications that your healthcare provider recommended. °· You notice blood, pus or swelling at the site of any test or treatment procedure. °· You develop a fever of more than 100.5° F (38.1° C). °SEEK IMMEDIATE MEDICAL CARE IF:  °You develop a fever of more than 102.0° F (38.9° C). °Document Released: 02/26/2009 Document Revised: 07/25/2011 Document Reviewed: 02/26/2009 °[employer]® Patient Information ©2014 [employer]. ° °

## 2013-02-10 NOTE — Progress Notes (Signed)
  Subjective:     Dominique [name] is a 52 y.o. female who presents for evaluation of symptoms of a URI. Symptoms include congestion, facial pain, nasal congestion, no  fever and non productive cough. Onset of symptoms was a few days ago, and has been gradually worsening since that time. Treatment to date: antihistamines and cough suppressants.  The following portions of the patient's history were reviewed and updated as appropriate: allergies, current medications, past family history, past medical history, past social history, past surgical history and problem list.  Review of Systems Pertinent items are noted in HPI.   Objective:    BP 126/68  Pulse 87  Temp(Src) 98.3 F (36.8 C) (Oral)  Wt 175 lb 4.8 oz (79.516 kg)  BMI 31.06 kg/m2  SpO2 97% General appearance: alert, cooperative, appears stated age and no distress Ears: normal TM's and external ear canals both ears Nose: clear discharge, mild congestion, turbinates red, swollen Throat: lips, mucosa, and tongue normal; teeth and gums normal Neck: no adenopathy, supple, symmetrical, trachea midline and thyroid not enlarged, symmetric, no tenderness/mass/nodules Lungs: clear to auscultation bilaterally Heart: S1, S2 normal Extremities: extremities normal, atraumatic, no cyanosis or edema   Assessment:    viral upper respiratory illness   Plan:    Discussed diagnosis and treatment of URI. Suggested symptomatic OTC remedies. Nasal saline spray for congestion. Nasal steroids per orders. Follow up as needed.

## 2013-02-15 ENCOUNTER — Other Ambulatory Visit: Payer: Self-pay | Admitting: Specialist

## 2013-02-15 DIAGNOSIS — M25522 Pain in left elbow: Secondary | ICD-10-CM

## 2013-02-18 ENCOUNTER — Ambulatory Visit
Admission: RE | Admit: 2013-02-18 | Discharge: 2013-02-18 | Disposition: A | Discharge status: Home / Self Care | Payer: No Typology Code available for payment source | Source: Ambulatory Visit | Attending: Specialist | Admitting: Specialist

## 2013-02-18 DIAGNOSIS — M25522 Pain in left elbow: Secondary | ICD-10-CM

## 2013-02-21 ENCOUNTER — Encounter (HOSPITAL_COMMUNITY): Payer: Self-pay | Admitting: Pharmacy Technician

## 2013-02-21 ENCOUNTER — Encounter (HOSPITAL_COMMUNITY)
Admission: RE | Admit: 2013-02-21 | Discharge: 2013-02-21 | Disposition: A | Discharge status: Home / Self Care | Payer: 59 | Source: Ambulatory Visit | Attending: Orthopedic Surgery | Admitting: Orthopedic Surgery

## 2013-02-21 ENCOUNTER — Encounter (HOSPITAL_COMMUNITY): Payer: Self-pay

## 2013-02-21 HISTORY — DX: Unspecified asthma, uncomplicated: J45.909

## 2013-02-21 HISTORY — DX: Unspecified osteoarthritis, unspecified site: M19.90

## 2013-02-21 HISTORY — DX: Personal history of other diseases of the digestive system: Z87.19

## 2013-02-21 LAB — BASIC METABOLIC PANEL
BUN: 16 mg/dL (ref 6–23)
CO2: 25 mEq/L (ref 19–32)
Calcium: 9.4 mg/dL (ref 8.4–10.5)
Creatinine, Ser: 0.79 mg/dL (ref 0.50–1.10)
GFR calc Af Amer: >90 mL/min (ref >90)
GFR calc non Af Amer: >90 mL/min (ref >90)
Sodium: 139 mEq/L (ref 135–145)

## 2013-02-21 LAB — CBC
MCH: 30.6 pg (ref 26.0–34.0)
MCHC: 35.4 g/dL (ref 30.0–36.0)
MCV: 86.4 fL (ref 78.0–100.0)
Platelets: 177 10*3/uL (ref 150–400)
RBC: 5.13 MIL/uL — ABNORMAL HIGH (ref 3.87–5.11)

## 2013-02-21 LAB — TYPE AND SCREEN: Antibody Screen: NEGATIVE

## 2013-02-21 LAB — SURGICAL PCR SCREEN
MRSA, PCR: NEGATIVE
Staphylococcus aureus: NEGATIVE

## 2013-02-21 LAB — ABO/RH: ABO/RH(D): O POS

## 2013-02-21 NOTE — Pre-Procedure Instructions (Signed)
Dominique [name]  02/21/2013   Your procedure is scheduled on:  Saturday, February 23, 2013 at 7:30 AM.    Report to Emergency Department Registration at 6:00 AM.   Call this number if you have problems the morning of surgery: [phone]   Remember:   Do not eat food or drink liquids after midnight Friday, 02/22/13.   Take these medicines the morning of surgery with A SIP OF WATER: estradiol (ESTRACE), oxyCODONE-acetaminophen (PERCOCET) - if needed, dexlansoprazole (DEXILANT), albuterol (VENTOLIN HFA) inhaler, beclomethasone (QVAR) inhaler, fluticasone (VERAMYST)                   Do not wear jewelry, make-up or nail polish.  Do not wear lotions, powders, or perfumes. You may wear deodorant.  Do not shave 48 hours prior to surgery.   Do not bring valuables to the hospital.  [facility] is not responsible                  for any belongings or valuables.               Contacts, dentures or bridgework may not be worn into surgery.  Leave suitcase in the car. After surgery it may be brought to your room.  For patients admitted to the hospital, discharge time is determined by your                treatment team.                 Special Instructions: Shower using CHG 2 nights before surgery and the night before surgery.  If you shower the day of surgery use CHG.  Use special wash - you have one bottle of CHG for all showers.  You should use approximately 1/3 of the bottle for each shower.   Please read over the following fact sheets that you were given: Pain Booklet, Coughing and Deep Breathing, Blood Transfusion Information, MRSA Information and Surgical Site Infection Prevention

## 2013-02-22 ENCOUNTER — Other Ambulatory Visit: Payer: Self-pay | Admitting: Orthopedic Surgery

## 2013-02-23 ENCOUNTER — Encounter (HOSPITAL_COMMUNITY): Payer: Self-pay | Admitting: *Deleted

## 2013-02-23 ENCOUNTER — Encounter (HOSPITAL_COMMUNITY): Payer: 59 | Admitting: Anesthesiology

## 2013-02-23 ENCOUNTER — Ambulatory Visit (HOSPITAL_COMMUNITY): Payer: 59 | Admitting: Anesthesiology

## 2013-02-23 ENCOUNTER — Encounter (HOSPITAL_COMMUNITY)
Admission: RE | Disposition: A | Discharge status: Home / Self Care | Payer: Self-pay | Source: Ambulatory Visit | Attending: Orthopedic Surgery

## 2013-02-23 ENCOUNTER — Observation Stay (HOSPITAL_COMMUNITY)
Admission: RE | Admit: 2013-02-23 | Discharge: 2013-02-24 | Disposition: A | Discharge status: Home / Self Care | Payer: 59 | Source: Ambulatory Visit | Attending: Orthopedic Surgery | Admitting: Orthopedic Surgery

## 2013-02-23 DIAGNOSIS — Z01812 Encounter for preprocedural laboratory examination: Secondary | ICD-10-CM | POA: Insufficient documentation

## 2013-02-23 DIAGNOSIS — W19XXXA Unspecified fall, initial encounter: Secondary | ICD-10-CM | POA: Insufficient documentation

## 2013-02-23 DIAGNOSIS — S52123A Displaced fracture of head of unspecified radius, initial encounter for closed fracture: Principal | ICD-10-CM | POA: Insufficient documentation

## 2013-02-23 DIAGNOSIS — Z79899 Other long term (current) drug therapy: Secondary | ICD-10-CM | POA: Insufficient documentation

## 2013-02-23 DIAGNOSIS — M24029 Loose body in unspecified elbow: Secondary | ICD-10-CM | POA: Insufficient documentation

## 2013-02-23 DIAGNOSIS — S52122S Displaced fracture of head of left radius, sequela: Secondary | ICD-10-CM

## 2013-02-23 DIAGNOSIS — I10 Essential (primary) hypertension: Secondary | ICD-10-CM | POA: Insufficient documentation

## 2013-02-23 HISTORY — PX: RADIAL HEAD ARTHROPLASTY: SHX6044

## 2013-02-23 SURGERY — ARTHROPLASTY, RADIUS, HEAD
Anesthesia: Regional | Site: Elbow | Laterality: Left | Wound class: Clean

## 2013-02-23 MED ORDER — CEFAZOLIN SODIUM-DEXTROSE 2-3 GM-% IV SOLR
2.0000 g | INTRAVENOUS | Status: AC
  Administered 2013-02-23: 2 g via INTRAVENOUS
  Filled 2013-02-23: qty 50

## 2013-02-23 MED ORDER — ALPRAZOLAM 0.5 MG PO TABS: 0.5000 mg | ORAL_TABLET | Freq: Four times a day (QID) | ORAL | Status: DC | PRN

## 2013-02-23 MED ORDER — SENNA 8.6 MG PO TABS
1.0000 | ORAL_TABLET | Freq: Two times a day (BID) | ORAL | Status: DC
  Administered 2013-02-23 – 2013-02-24 (×3): 8.6 mg via ORAL
  Filled 2013-02-23 (×4): qty 1

## 2013-02-23 MED ORDER — LIDOCAINE HCL (CARDIAC) 20 MG/ML IV SOLN
INTRAVENOUS | Status: DC | PRN
  Administered 2013-02-23: 40 mg via INTRAVENOUS

## 2013-02-23 MED ORDER — PROMETHAZINE HCL 25 MG RE SUPP: 12.5000 mg | Freq: Four times a day (QID) | RECTAL | Status: DC | PRN

## 2013-02-23 MED ORDER — METHOCARBAMOL 100 MG/ML IJ SOLN
500.0000 mg | Freq: Four times a day (QID) | INTRAVENOUS | Status: DC | PRN
  Filled 2013-02-23: qty 5

## 2013-02-23 MED ORDER — OXYCODONE HCL 5 MG PO TABS: 5.0000 mg | ORAL_TABLET | Freq: Once | ORAL | Status: DC | PRN

## 2013-02-23 MED ORDER — INDOMETHACIN ER 75 MG PO CPCR
75.0000 mg | ORAL_CAPSULE | Freq: Every day | ORAL | Status: DC
  Administered 2013-02-24: 75 mg via ORAL
  Filled 2013-02-23 (×2): qty 1

## 2013-02-23 MED ORDER — ADULT MULTIVITAMIN W/MINERALS CH
1.0000 | ORAL_TABLET | Freq: Every day | ORAL | Status: DC
  Administered 2013-02-23 – 2013-02-24 (×2): 1 via ORAL
  Filled 2013-02-23 (×2): qty 1

## 2013-02-23 MED ORDER — PANTOPRAZOLE SODIUM 40 MG PO TBEC: 40.0000 mg | DELAYED_RELEASE_TABLET | Freq: Two times a day (BID) | ORAL | Status: DC | PRN

## 2013-02-23 MED ORDER — ALBUTEROL SULFATE HFA 108 (90 BASE) MCG/ACT IN AERS: 2.0000 | INHALATION_SPRAY | Freq: Four times a day (QID) | RESPIRATORY_TRACT | Status: DC | PRN

## 2013-02-23 MED ORDER — MIDAZOLAM HCL 5 MG/5ML IJ SOLN
INTRAMUSCULAR | Status: DC | PRN
  Administered 2013-02-23 (×2): 1 mg via INTRAVENOUS

## 2013-02-23 MED ORDER — LOSARTAN POTASSIUM 50 MG PO TABS
100.0000 mg | ORAL_TABLET | Freq: Every day | ORAL | Status: DC
Start: 2013-02-23 — End: 2013-02-24
  Administered 2013-02-23 – 2013-02-24 (×2): 100 mg via ORAL
  Filled 2013-02-23 (×2): qty 2

## 2013-02-23 MED ORDER — VITAMIN D3 25 MCG (1000 UNIT) PO TABS
1000.0000 [IU] | ORAL_TABLET | Freq: Every day | ORAL | Status: DC
  Administered 2013-02-23 – 2013-02-24 (×2): 1000 [IU] via ORAL
  Filled 2013-02-23 (×2): qty 1

## 2013-02-23 MED ORDER — FLUTICASONE FUROATE 27.5 MCG/SPRAY NA SUSP: 2.0000 | Freq: Every day | NASAL | Status: DC

## 2013-02-23 MED ORDER — METHOCARBAMOL 500 MG PO TABS
500.0000 mg | ORAL_TABLET | Freq: Four times a day (QID) | ORAL | Status: DC | PRN
  Administered 2013-02-24 (×2): 500 mg via ORAL
  Filled 2013-02-23 (×2): qty 1

## 2013-02-23 MED ORDER — 0.9 % SODIUM CHLORIDE (POUR BTL) OPTIME
TOPICAL | Status: DC | PRN
  Administered 2013-02-23: 3000 mL

## 2013-02-23 MED ORDER — FENTANYL CITRATE 0.05 MG/ML IJ SOLN
INTRAMUSCULAR | Status: DC | PRN
  Administered 2013-02-23 (×3): 50 ug via INTRAVENOUS

## 2013-02-23 MED ORDER — ONDANSETRON HCL 4 MG/2ML IJ SOLN
INTRAMUSCULAR | Status: DC | PRN
  Administered 2013-02-23: 4 mg via INTRAMUSCULAR

## 2013-02-23 MED ORDER — HYDROMORPHONE HCL PF 1 MG/ML IJ SOLN: 0.2500 mg | INTRAMUSCULAR | Status: DC | PRN

## 2013-02-23 MED ORDER — MONTELUKAST SODIUM 10 MG PO TABS
10.0000 mg | ORAL_TABLET | Freq: Every day | ORAL | Status: DC
  Administered 2013-02-23: 10 mg via ORAL
  Filled 2013-02-23 (×2): qty 1

## 2013-02-23 MED ORDER — BACITRACIN-POLYMYXIN B 500-10000 UNIT/GM OP OINT
TOPICAL_OINTMENT | OPHTHALMIC | Status: AC
  Filled 2013-02-23: qty 3.5

## 2013-02-23 MED ORDER — MORPHINE SULFATE 2 MG/ML IJ SOLN: 1.0000 mg | INTRAMUSCULAR | Status: DC | PRN

## 2013-02-23 MED ORDER — OXYCODONE HCL 5 MG PO TABS
5.0000 mg | ORAL_TABLET | ORAL | Status: DC | PRN
  Administered 2013-02-23 – 2013-02-24 (×2): 5 mg via ORAL
  Administered 2013-02-24 (×3): 10 mg via ORAL
  Filled 2013-02-23 (×3): qty 1
  Filled 2013-02-23 (×3): qty 2

## 2013-02-23 MED ORDER — VITAMIN C 500 MG PO TABS
1000.0000 mg | ORAL_TABLET | Freq: Every day | ORAL | Status: DC
  Administered 2013-02-23 – 2013-02-24 (×2): 1000 mg via ORAL
  Filled 2013-02-23 (×2): qty 2

## 2013-02-23 MED ORDER — PHENYLEPHRINE HCL 10 MG/ML IJ SOLN
INTRAMUSCULAR | Status: DC | PRN
  Administered 2013-02-23 (×3): 80 ug via INTRAVENOUS

## 2013-02-23 MED ORDER — CEFAZOLIN SODIUM 1-5 GM-% IV SOLN
1.0000 g | Freq: Three times a day (TID) | INTRAVENOUS | Status: DC
  Administered 2013-02-23 – 2013-02-24 (×2): 1 g via INTRAVENOUS
  Filled 2013-02-23 (×4): qty 50

## 2013-02-23 MED ORDER — SODIUM CHLORIDE 0.45 % IV SOLN
INTRAVENOUS | Status: DC
  Administered 2013-02-23: 12:00:00 via INTRAVENOUS

## 2013-02-23 MED ORDER — PHENYLEPHRINE HCL 10 MG/ML IJ SOLN
10.0000 mg | INTRAMUSCULAR | Status: DC | PRN
  Administered 2013-02-23: 40 ug/min via INTRAVENOUS

## 2013-02-23 MED ORDER — BUPIVACAINE-EPINEPHRINE PF 0.5-1:200000 % IJ SOLN
INTRAMUSCULAR | Status: DC | PRN
  Administered 2013-02-23: 30 mL

## 2013-02-23 MED ORDER — LACTATED RINGERS IV SOLN
INTRAVENOUS | Status: DC | PRN
  Administered 2013-02-23 (×2): via INTRAVENOUS

## 2013-02-23 MED ORDER — TEMAZEPAM 15 MG PO CAPS: 15.0000 mg | ORAL_CAPSULE | Freq: Every evening | ORAL | Status: DC | PRN

## 2013-02-23 MED ORDER — ONDANSETRON HCL 4 MG/2ML IJ SOLN: 4.0000 mg | Freq: Four times a day (QID) | INTRAMUSCULAR | Status: DC | PRN

## 2013-02-23 MED ORDER — ALBUTEROL SULFATE (5 MG/ML) 0.5% IN NEBU
2.5000 mg | INHALATION_SOLUTION | Freq: Four times a day (QID) | RESPIRATORY_TRACT | Status: DC
  Filled 2013-02-23 (×2): qty 0.5

## 2013-02-23 MED ORDER — OXYCODONE HCL 5 MG/5ML PO SOLN: 5.0000 mg | Freq: Once | ORAL | Status: DC | PRN

## 2013-02-23 MED ORDER — BUPIVACAINE HCL (PF) 0.25 % IJ SOLN
INTRAMUSCULAR | Status: AC
  Filled 2013-02-23: qty 30

## 2013-02-23 MED ORDER — FLUTICASONE PROPIONATE 50 MCG/ACT NA SUSP
1.0000 | Freq: Every day | NASAL | Status: DC
  Administered 2013-02-24: 1 via NASAL
  Filled 2013-02-23: qty 16

## 2013-02-23 MED ORDER — PROPOFOL 10 MG/ML IV BOLUS
INTRAVENOUS | Status: DC | PRN
  Administered 2013-02-23: 200 mg via INTRAVENOUS

## 2013-02-23 MED ORDER — ONDANSETRON HCL 4 MG PO TABS: 4.0000 mg | ORAL_TABLET | Freq: Four times a day (QID) | ORAL | Status: DC | PRN

## 2013-02-23 MED ORDER — CHLORHEXIDINE GLUCONATE 4 % EX LIQD: 60.0000 mL | Freq: Once | CUTANEOUS | Status: DC

## 2013-02-23 MED ORDER — CEFAZOLIN SODIUM 1-5 GM-% IV SOLN
1.0000 g | INTRAVENOUS | Status: AC
  Administered 2013-02-23: 1 g via INTRAVENOUS
  Filled 2013-02-23: qty 50

## 2013-02-23 MED ORDER — FLUTICASONE PROPIONATE HFA 44 MCG/ACT IN AERO
2.0000 | INHALATION_SPRAY | Freq: Two times a day (BID) | RESPIRATORY_TRACT | Status: DC
  Filled 2013-02-23: qty 10.6

## 2013-02-23 MED ORDER — BUPIVACAINE HCL (PF) 0.5 % IJ SOLN
INTRAMUSCULAR | Status: DC | PRN
  Administered 2013-02-23: 5 mL

## 2013-02-23 SURGICAL SUPPLY — 59 items
BANDAGE ELASTIC 3 VELCRO ST LF (GAUZE/BANDAGES/DRESSINGS) IMPLANT
BANDAGE ELASTIC 4 VELCRO ST LF (GAUZE/BANDAGES/DRESSINGS) IMPLANT
BANDAGE GAUZE ELAST BULKY 4 IN (GAUZE/BANDAGES/DRESSINGS) ×2 IMPLANT
BLADE LONG MED 31X9 (MISCELLANEOUS) ×2 IMPLANT
BNDG COHESIVE 4X5 TAN STRL (GAUZE/BANDAGES/DRESSINGS) IMPLANT
BNDG ESMARK 4X9 LF (GAUZE/BANDAGES/DRESSINGS) ×2 IMPLANT
CLOTH BEACON ORANGE TIMEOUT ST (SAFETY) IMPLANT
CORDS BIPOLAR (ELECTRODE) ×2 IMPLANT
COVER MAYO STAND STRL (DRAPES) ×2 IMPLANT
COVER SURGICAL LIGHT HANDLE (MISCELLANEOUS) ×2 IMPLANT
CUFF TOURNIQUET SINGLE 18IN (TOURNIQUET CUFF) ×2 IMPLANT
CUFF TOURNIQUET SINGLE 24IN (TOURNIQUET CUFF) IMPLANT
DRAPE INCISE IOBAN 66X45 STRL (DRAPES) ×2 IMPLANT
DRAPE OEC MINIVIEW 54X84 (DRAPES) ×2 IMPLANT
DRESSING MATRIX WOUND 4X5 (GAUZE/BANDAGES/DRESSINGS) ×2 IMPLANT
DRSG ADAPTIC 3X8 NADH LF (GAUZE/BANDAGES/DRESSINGS) ×2 IMPLANT
DRSG MATRIX WOUND 4X5 (GAUZE/BANDAGES/DRESSINGS) ×4
GAUZE XEROFORM 1X8 LF (GAUZE/BANDAGES/DRESSINGS) ×2 IMPLANT
GAUZE XEROFORM 5X9 LF (GAUZE/BANDAGES/DRESSINGS) ×2 IMPLANT
GLOVE BIOGEL M STRL SZ7.5 (GLOVE) ×2 IMPLANT
GLOVE SS BIOGEL STRL SZ 8 (GLOVE) ×1 IMPLANT
GLOVE SUPERSENSE BIOGEL SZ 8 (GLOVE) ×1
GOWN PREVENTION PLUS XLARGE (GOWN DISPOSABLE) ×2 IMPLANT
GOWN STRL NON-REIN LRG LVL3 (GOWN DISPOSABLE) ×4 IMPLANT
GOWN STRL REIN XL XLG (GOWN DISPOSABLE) ×4 IMPLANT
IMPL HEAD (Orthopedic Implant) ×1 IMPLANT
IMPLANT HEAD (Orthopedic Implant) ×2 IMPLANT
KIT BASIN OR (CUSTOM PROCEDURE TRAY) ×2 IMPLANT
KIT ROOM TURNOVER OR (KITS) ×2 IMPLANT
LOOP VESSEL MAXI BLUE (MISCELLANEOUS) IMPLANT
MANIFOLD NEPTUNE II (INSTRUMENTS) IMPLANT
NEEDLE HYPO 25GX1X1/2 BEV (NEEDLE) ×2 IMPLANT
NS IRRIG 1000ML POUR BTL (IV SOLUTION) ×6 IMPLANT
PACK ORTHO EXTREMITY (CUSTOM PROCEDURE TRAY) ×2 IMPLANT
PAD ARMBOARD 7.5X6 YLW CONV (MISCELLANEOUS) ×4 IMPLANT
PAD CAST 4YDX4 CTTN HI CHSV (CAST SUPPLIES) ×2 IMPLANT
PADDING CAST ABS 4INX4YD NS (CAST SUPPLIES) ×1
PADDING CAST ABS COTTON 4X4 ST (CAST SUPPLIES) ×1 IMPLANT
PADDING CAST COTTON 4X4 STRL (CAST SUPPLIES) ×2
SOLUTION BETADINE 4OZ (MISCELLANEOUS) ×2 IMPLANT
SPECIMEN JAR SMALL (MISCELLANEOUS) ×2 IMPLANT
SPLINT FIBERGLASS 4X30 (CAST SUPPLIES) ×2 IMPLANT
SPONGE GAUZE 4X4 12PLY (GAUZE/BANDAGES/DRESSINGS) ×2 IMPLANT
SPONGE SCRUB IODOPHOR (GAUZE/BANDAGES/DRESSINGS) ×2 IMPLANT
STEM IMPLANT W SCREW (Stem) ×2 IMPLANT
SUCTION FRAZIER TIP 10 FR DISP (SUCTIONS) ×2 IMPLANT
SUT MERSILENE 4 0 P 3 (SUTURE) IMPLANT
SUT PROLENE 3 0 PS 2 (SUTURE) ×2 IMPLANT
SUT PROLENE 4 0 PS 2 18 (SUTURE) IMPLANT
SUT VIC AB 2-0 CT1 27 (SUTURE)
SUT VIC AB 2-0 CT1 TAPERPNT 27 (SUTURE) IMPLANT
SUT VIC AB 4-0 PS2 27 (SUTURE) ×2 IMPLANT
SYR CONTROL 10ML LL (SYRINGE) ×2 IMPLANT
TOWEL OR 17X24 6PK STRL BLUE (TOWEL DISPOSABLE) ×2 IMPLANT
TOWEL OR 17X26 10 PK STRL BLUE (TOWEL DISPOSABLE) ×4 IMPLANT
TUBE CONNECTING 12X1/4 (SUCTIONS) ×2 IMPLANT
UNDERPAD 30X30 INCONTINENT (UNDERPADS AND DIAPERS) ×2 IMPLANT
WATER STERILE IRR 1000ML POUR (IV SOLUTION) ×2 IMPLANT
juggerknot short rigid size 1 (Anchor) ×2 IMPLANT

## 2013-02-23 NOTE — Op Note (Signed)
See Dictation #[id number] Dominica Severin MD

## 2013-02-23 NOTE — Preoperative (Signed)
Beta Blockers   Reason not to administer Beta Blockers:Not Applicable 

## 2013-02-23 NOTE — Anesthesia Procedure Notes (Addendum)
Anesthesia Regional Block:  Supraclavicular block  Pre-Anesthetic Checklist: ,, timeout performed, Correct Patient, Correct Site, Correct Laterality, Correct Procedure, Correct Position, site marked, Risks and benefits discussed, pre-op evaluation, post-op pain management  Laterality: Left  Prep: Maximum Sterile Barrier Precautions used and chloraprep       Needles:  Injection technique: Single-shot  Needle Type: Echogenic Stimulator Needle     Needle Length: 5cm 5 cm Needle Gauge: 22 and 22 G    Additional Needles:  Procedures: ultrasound guided (picture in chart) Supraclavicular block Narrative:  Start time: 02/23/2013 7:21 AM End time: 02/23/2013 7:32 AM Injection made incrementally with aspirations every 5 mL. Anesthesiologist: Fitzgerald,MD  Additional Notes: 2% Lidocaine skin wheel.  Supraclavicular block Procedure Name: LMA Insertion Date/Time: 02/23/2013 8:19 AM Performed by: Coralee Rud Pre-anesthesia Checklist: Patient identified, Emergency Drugs available, Suction available and Patient being monitored Patient Re-evaluated:Patient Re-evaluated prior to inductionOxygen Delivery Method: Circle system utilized Preoxygenation: Pre-oxygenation with 100% oxygen Intubation Type: IV induction Ventilation: Mask ventilation without difficulty LMA: LMA inserted LMA Size: 4.0 Number of attempts: 1 Placement Confirmation: positive ETCO2 and breath sounds checked- equal and bilateral Tube secured with: Tape Dental Injury: Teeth and Oropharynx as per pre-operative assessment

## 2013-02-23 NOTE — Progress Notes (Signed)
Orthopedic Tech Progress Note Patient Details:  Dominique [name] February 24, 1961 [id number] Mission sling applied to Left UE. Tolerated well.  Ortho Devices Type of Ortho Device: Arm sling Ortho Device/Splint Location: Left Mission SLing Ortho Device/Splint Interventions: Application   Asia R Ullom 02/23/2013, 2:51 PM

## 2013-02-23 NOTE — Anesthesia Postprocedure Evaluation (Signed)
  Anesthesia Post-op Note  Patient: Dominique [name]  Procedure(s) Performed: Procedure(s): LEFT RADIAL HEAD ARTHROPLASTY qwith ligament reconstruction (Left)  Patient Location: PACU  Anesthesia Type:GA combined with regional for post-op pain  Level of Consciousness: awake and alert   Airway and Oxygen Therapy: Patient Spontanous Breathing  Post-op Pain: none  Post-op Assessment: Post-op Vital signs reviewed, Patient's Cardiovascular Status Stable, Respiratory Function Stable, Patent Airway and No signs of Nausea or vomiting  Post-op Vital Signs: Reviewed and stable  Complications: No apparent anesthesia complications

## 2013-02-23 NOTE — H&P (Signed)
Dominique [name] is an 52 y.o. female.   Chief Complaint: I broke my elbow HPI: Patient is a pleasant 52 year old female who unfortunately fell sustaining a left radial head fracture about her elbow this is highly comminuted reviewed his CT scan in our office. She is approaching now 3 weeks since initially sustaining the injury prior to seeking hand and upper extremity consultation. Discussed with the patient at length the nature of her upper extremity predicament and a recommendation for surgical intervention. Given the degree of comminution and displacement and discussed with the patient had recommendations for radial head arthroplasty as well as ligamentous repair and reconstruction as necessary patient's preoperative workup is without significant abnormality and she is cleared for surgical endeavors  Past Medical History  Diagnosis Date  . Hypertension   . Endometriosis   . 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 Reactive airway disease   . Allergy     rhinitis  . Kidney stone   . GERD (gastroesophageal reflux disease)   . Asthma   . Pneumonia   . H/O hiatal hernia   . Arthritis     left foot    Past Surgical History  Procedure Laterality Date  . Tonsillectomy and adenoidectomy  1967  . Uterine lining ablation  2002  . Laparatomy  1985    Family History  Problem Relation Age of Onset  . Diabetes Mother   . Hyperlipidemia Mother   . Hypertension Mother   . Diabetes Father   . Hyperlipidemia Father   . Hypertension Father    Social History:  reports that she has never smoked. She has never used smokeless tobacco. She reports that she does not drink alcohol or use illicit drugs.  Allergies: No Known Allergies  Medications Prior to Admission  Medication Sig Dispense Refill  . Ascorbic Acid (VITAMIN C) 1000 MG tablet Take 1,000 mg by mouth daily.      . beclomethasone (QVAR) 40 MCG/ACT inhaler Inhale 2 puffs into the lungs 2 (two) times daily.  3 Inhaler  3   . cholecalciferol (VITAMIN D) 1000 UNITS tablet Take 1,000 Units by mouth daily.      [name] dexlansoprazole (DEXILANT) 60 MG capsule Take 1 capsule (60 mg total) by mouth daily. Pt failed Nexium  90 capsule  1  . estradiol (ESTRACE) 1 MG tablet Take 1 tablet (1 mg total) by mouth daily.  90 tablet  1  . fluticasone (VERAMYST) 27.5 MCG/SPRAY nasal spray Place 2 sprays into the nose daily. Pt failed fluticasone  30 g  3  . indomethacin (INDOCIN SR) 75 MG CR capsule Take 75 mg by mouth daily with breakfast.      . LORazepam (ATIVAN) 1 MG tablet Take 1-2 mg by mouth See admin instructions. 90 minutes prior to CT scan      . losartan (COZAAR) 100 MG tablet Take 1 tablet (100 mg total) by mouth daily.  90 tablet  1  . montelukast (SINGULAIR) 10 MG tablet Take 1 tablet (10 mg total) by mouth at bedtime.  30 tablet  0  . Nebulizer MISC by Does not apply route. Dx: asthma       . oxyCODONE-acetaminophen (PERCOCET) 5-325 MG per tablet Take 2 tablets by mouth every 4 (four) hours as needed for pain.  20 tablet  0  . senna-docusate (SENOKOT-S) 8.6-50 MG per tablet Take 1 tablet by mouth 2 (two) times daily as needed for constipation.      [name]  traMADol (ULTRAM) 50 MG tablet Take 50 mg by mouth every 4 (four) hours as needed for pain.      [name] albuterol (VENTOLIN HFA) 108 (90 BASE) MCG/ACT inhaler Inhale 2 puffs into the lungs every 6 (six) hours as needed for wheezing or shortness of breath. For shortness of breath  3 Inhaler  0  . levalbuterol (XOPENEX) 1.25 MG/3ML nebulizer solution Take 1.25 mg by nebulization every 6 (six) hours as needed for wheezing or shortness of breath. 3 ml nebs  72 mL  0    Results for orders placed during the hospital encounter of 02/21/13 (from the past 48 hour(s))  BASIC METABOLIC PANEL     Status: Abnormal   Collection Time    02/21/13  9:00 AM      Result Value Range   Sodium 139  135 - 145 mEq/L   Potassium 4.3  3.5 - 5.1 mEq/L   Chloride 103  96 - 112 mEq/L   CO2 25  19 - 32  mEq/L   Glucose, Bld 116 (*) 70 - 99 mg/dL   BUN 16  6 - 23 mg/dL   Creatinine, Ser 1.61  0.50 - 1.10 mg/dL   Calcium 9.4  8.4 - 09.6 mg/dL   GFR calc non Af Amer >90  >90 mL/min   GFR calc Af Amer >90  >90 mL/min   Comment: (NOTE)     The eGFR has been calculated using the CKD EPI equation.     This calculation has not been validated in all clinical situations.     eGFR's persistently <90 mL/min signify possible Chronic Kidney     Disease.  SURGICAL PCR SCREEN     Status: None   Collection Time    02/21/13  9:00 AM      Result Value Range   MRSA, PCR NEGATIVE  NEGATIVE   Staphylococcus aureus NEGATIVE  NEGATIVE   Comment:            The Xpert SA Assay (FDA     approved for NASAL specimens     in patients over 13 years of age),     is one component of     a comprehensive surveillance     program.  Test performance has     been validated by [employer] for patients greater     than or equal to 74 year old.     It is not intended     to diagnose infection nor to     guide or monitor treatment.  CBC     Status: Abnormal   Collection Time    02/21/13  9:01 AM      Result Value Range   WBC 6.7  4.0 - 10.5 K/uL   RBC 5.13 (*) 3.87 - 5.11 MIL/uL   Hemoglobin 15.7 (*) 12.0 - 15.0 g/dL   HCT 04.5  40.9 - 81.1 %   MCV 86.4  78.0 - 100.0 fL   MCH 30.6  26.0 - 34.0 pg   MCHC 35.4  30.0 - 36.0 g/dL   RDW 91.4  78.2 - 95.6 %   Platelets 177  150 - 400 K/uL  TYPE AND SCREEN     Status: None   Collection Time    02/21/13  9:10 AM      Result Value Range   ABO/RH(D) O POS     Antibody Screen NEG     Sample Expiration 03/07/2013  ABO/RH     Status: None   Collection Time    02/21/13  9:10 AM      Result Value Range   ABO/RH(D) O POS     Dg Chest 2 View  02/21/2013   CLINICAL DATA:  Preoperative evaluation. History of hypertension. History of previous histoplasmosis and reactive airway disease and hiatal hernia.  EXAM: CHEST  2 VIEW  COMPARISON:  01/08/2008.  06/03/2010.   FINDINGS: Cardiac silhouette is normal size and shape. This is stable. There is stable slight ectasia of brachiocephalic vessels. Mediastinal and hilar contours appear stable. No acute or active cardiopulmonary infiltrates are seen. The right upper lobe quadrant nodular opacity consistent with previous granulomatous disease is unchanged from prior studies. History is given of previous histoplasmosis. No pleural abnormality is evident. There is slightly osteopenic appearance of bones.  IMPRESSION: No acute or active cardiopulmonary or pleural abnormalities are seen. Stable old right upper lobe nodular opacity consistent with previous granulomatous infection. Slightly osteopenic appearance of bones.   Electronically Signed   By: Onalee Hua  Call M.D.   On: 02/21/2013 09:13    Review of Systems  Constitutional: Negative.   HENT: Negative.   Eyes: Negative.   Respiratory: Negative.   Cardiovascular: Negative.   Gastrointestinal: Negative.   Genitourinary: Negative.   Musculoskeletal:       See history of present illness  Skin: Negative.     Blood pressure 150/73, pulse 92, temperature 97.8 F (36.6 C), temperature source Oral, resp. rate 20, SpO2 99.00%. Physical Exam  Patient is pleasant in no acute distress appearing her stated age weight and height .Marland KitchenPatient presents for evaluation and treatment of the of their upper extremity Evaluation of the left upper extremity shows that her splint is intact she is normal sensate about the digits digital range of motion is intact no signs of dystrophy are present radial ulnar and median nerve are intact.The patient denies neck back chest or of abdominal pain. The patient notes that they have no lower extremity problems. The patient from primarily complains of the upper extremity pain noted. [name].The patient is alert and oriented in no acute distress the patient complains of pain in the affected upper extremity.  The patient is noted to have a normal HEENT exam.   Lung fields show equal chest expansion and no shortness of breath  abdomen exam is nontender without distention.  Lower extremity examination does not show any fracture dislocation or blood clot symptoms.  Pelvis is stable neck and back are stable and nontender   Assessment/Plan Comminuted displaced left radial head fracture OTITIS MEDIA, SEROUS, CHRONIC, RIGHT  HYPERTENSION  KERATOSIS  BURSITIS, RIGHT SHOULDER  WEIGHT GAIN  HOARSENESS, CHRONIC  ALLERGIC RHINITIS  GERD   .Marland KitchenWe are planning surgery for your upper extremity. The risk and benefits of surgery include risk of bleeding infection anesthesia damage to normal structures and failure of the surgery to accomplish its intended goals of relieving symptoms and restoring function with this in mind we'll going to proceed. I have specifically discussed with the patient the pre-and postoperative regime and the does and don'ts and risk and benefits in great detail. Risk and benefits of surgery also include risk of dystrophy chronic nerve pain failure of the healing process to go onto completion and other inherent risks of surgery The relavent the pathophysiology of the disease/injury process, as well as the alternatives for treatment and postoperative course of action has been discussed in great detail with the patient who desires to  proceed.  We will do everything in our power to help you (the patient) restore function to the upper extremity. Is a pleasure to see this patient today.    Kiren Mcisaac L 02/23/2013, 8:12 AM

## 2013-02-23 NOTE — Anesthesia Preprocedure Evaluation (Addendum)
Anesthesia Evaluation  Patient identified by MRN, date of birth, ID band Patient awake    Reviewed: Allergy & Precautions, H&P   History of Anesthesia Complications (+) history of anesthetic complications  Airway Mallampati: III    Mouth opening: Limited Mouth Opening Comment: TMJ Dental  (+) Teeth Intact and Dental Advisory Given   Pulmonary asthma ,  History of histoplasmosis in 2002, small spot,  but only watched for several years afterwards.   Pulmonary exam normal       Cardiovascular hypertension, Pt. on medications Rhythm:Regular Rate:Normal     Neuro/Psych Anxiety    GI/Hepatic GERD-  Medicated and Controlled,  Endo/Other    Renal/GU Renal diseaseKidney stones     Musculoskeletal   Abdominal   Peds  Hematology   Anesthesia Other Findings   Reproductive/Obstetrics                          Anesthesia Physical Anesthesia Plan  ASA: II  Anesthesia Plan: Regional and General   Post-op Pain Management:    Induction: Intravenous  Airway Management Planned: Oral ETT  Additional Equipment:   Intra-op Plan:   Post-operative Plan: Extubation in OR  Informed Consent: I have reviewed the patients History and Physical, chart, labs and discussed the procedure including the risks, benefits and alternatives for the proposed anesthesia with the patient or authorized representative who has indicated his/her understanding and acceptance.   Dental advisory given  Plan Discussed with: Anesthesiologist, CRNA and Surgeon  Anesthesia Plan Comments:        Anesthesia Quick Evaluation

## 2013-02-23 NOTE — Transfer of Care (Signed)
Immediate Anesthesia Transfer of Care Note   Patient: Dominique [name]  Procedure(s) Performed: Procedure(s): LEFT RADIAL HEAD ARTHROPLASTY qwith ligament reconstruction (Left)  Patient Location: PACU  Anesthesia Type:General  Level of Consciousness: awake, oriented, sedated and patient cooperative  Airway & Oxygen Therapy: Patient Spontanous Breathing and Patient connected to nasal cannula oxygen  Post-op Assessment: Report given to PACU RN, Post -op Vital signs reviewed and stable and Patient moving all extremities  Post vital signs: Reviewed and stable  Complications: No apparent anesthesia complications

## 2013-02-24 MED ORDER — OXYCODONE HCL 5 MG PO TABS: 5.0000 mg | ORAL_TABLET | ORAL | Status: DC | PRN

## 2013-02-24 MED ORDER — METHOCARBAMOL 500 MG PO TABS: 500.0000 mg | ORAL_TABLET | Freq: Four times a day (QID) | ORAL | Status: DC | PRN

## 2013-02-24 NOTE — Op Note (Signed)
Dominique Dominique [name], Dominique [name] NO.:  [id number]  MEDICAL RECORD NO.:  [id number]  LOCATION:  5N29C                        FACILITY:  [facility]  PHYSICIAN:  Dominique Dominique [name], M.D.DATE OF BIRTH:  [birth year]  DATE OF PROCEDURE: DATE OF DISCHARGE:                              OPERATIVE REPORT   PREOPERATIVE DIAGNOSES:  Complex intra-articular elbow fracture with the radial capitellar joint injury and fracture as well as lateral ulnar collateral ligament insufficiency secondary to the traumatic process.  POSTOPERATIVE DIAGNOSES:  Complex intra-articular elbow fracture with the radial capitellar joint injury and fracture as well as lateral ulnar collateral ligament insufficiency secondary to the traumatic process.  PROCEDURE: 1. Excision of left radial head. 2. Radial head arthroplasty, left elbow. 3. Intra-articular joint debridement with synovectomy, arthrotomy and     clean up of multiple loose bodies about the anterior and posterior     fossa regions. 4. Lateral ulnar collateral ligament reconstruction with suture anchor     technology (JuggerKnot). 5. Stress radiography.  SURGEON:  Dominique Dominique [name], M.D.  ASSISTANT:  Karie Dominique [name], P.A.-C.  COMPLICATIONS:  None.  ANESTHESIA:  General preoperative block.  TOURNIQUET TIME:  Less than an hour.  DRAINS:  None.  INDICATIONS:  This patient is a pleasant female who is 52 years of age sustained a traumatic injury nearly 3 weeks ago.  She presented to my office this week with a significant disarray of her elbow.  We discussed her the risks and benefits, including my concern of heterotopic ossification, stiffness, and other issues.  I feel that she needs an aggressive approach of elbow stabilization followed by dedicated therapy in an aggressive fashion as her fingers, wrist, elbow, and entire arm is stiff.  She is not obviously dystrophic, but certainly we want to engage her very quickly with the above mentioned  measures.  She understands all risks, benefits, and desires to proceed.  OPERATIVE PROCEDURE:  The patient was seen by myself and anesthesia, taken to operative suite, and underwent smooth induction of general anesthesia preoperatively, Dr. Sampson Goon placed a block.  She was given preoperative Ancef.  She has no known drug allergies.  Once in the operative arena, time-out was called.  Pre and postop check was complete.  The arm was prepped and draped in usual sterile fashion.  We performed a 10 minutes surgical scrub performed by Mr. Areatha Keas. Following this, she was sterilely draped, outline marks were made and under sterile conditions, we insufflated the tourniquet to 250 mmHg.  An incision was made laterally about the elbow at this juncture, and we created a plane between the fascia and subcutaneous tissue.  We then created a plane between the anconeus and ECU, carefully retracting the anconeus and ECU interval.  Once this was done, we opened the elbow capsule and immediately found the extruded fragments in the soft tissues.  We removed the extruded fragment pieces of the radial head. Following this, it was very apparent that this was not a candidate for ORIF and the radial head arthroplasty would be the best option for her. At this juncture, we then brought in small Bennett retractors placed and full pronation and then made an oscillating cut soft and then under  live fluoro, made sure the saw cut in pronation, supination, and neutral was equal level and nicely planed.  I scoped to this with a file.  About 3 L of irrigation was placed through the elbow at this point.  We placed retractors and performed arthrotomy, synovectomy, and removal of multiple loose bodies, in the anterior and posterior fossae.  I should note the capitellum had severe scuffing injury and the ulna also had severe scuffing injury about the cartilaginous surfaces which increases her risk of  degenerative/traumatic arthritis in the future.  I have discussed these issues with her postop course.  Once we performed arthrotomy, synovectomy, and multiple loose body removal, we then broached the canal to a size 6.  This had a nice firm fit.  Following this, we trialed her to a appropriate Biomet head size. The patient tolerated this well.  I did not use any increase in neck length as I felt that the proximal radioulnar joint and articulation with the  cut and our trial head looked excellent.  Following this, we then performed very careful and cautious irrigation once again followed by bringing in  fluoroscopy to make sure that she was not over stiff.  She had full range of motion and all looked well without any medial ulnar and humeral ligament insufficiency.  This was noted to be the case we replaced.  We then removed the trial implants. Following this, I then pre-prepared with JuggerKnot, a suture anchor placement about the lateral aspect of the elbow.  Suture anchor was placed without difficulty from Biomet using the JuggerKnot fixation system.  Following this, I pre-placed stitches in a weave  technique due to lateral ligament.  At this juncture, we irrigated once more given my significant concern of heterotopic ossification, given the time delay from injury to surgery. Following this, we implanted the permanent implants without difficulty. I placed small amount of bone wax over the locking screw and once this was done, we then closed the area about the lateral and ulnar collateral ligament with the pre-placed suture anchors.  This was a lateral ulnar collateral ligament repair reconstruction distinctly and separately performed.  This was not a portion of the arthroplasty, but was a ligamentous repair.  Following repair, we then closed the interval with nonabsorbable braided suture and following this, we then performed closure of the subcu with 4-0 Vicryl and closure of the  skin edge with Prolene.  Once again, this was a Biomet size 6 stem with corresponding appropriately sized radial head.  The patient tolerated this quite nicely and there were no complicating features.  The patient was placed in a sterile dressing and long-arm splint without difficulty.  Following this, the patient will be admitted for IV antibiotics, general postop observation and continue indomethacin as well as our pain management agree with them.  We will want to see her back in the office in 10-12 days and get her in therapy to begin well and assisted range of motion in an aggressive fashion.  I felt that aggressive range of motion, aggressive edema control, and other measures given the fact that she has sat still for quite some time, it is going to be absolutely imperative to her proper recovery. Do's and don'ts have been discussed and all questions have been encouraged and answered.  Should any problems arise we will be immediately available for her care.     Dominique Dominique [name], M.D.     [facility]  D:  02/23/2013  T:  02/23/2013  Job:  [id number]

## 2013-02-24 NOTE — Discharge Summary (Signed)
Physician Discharge Summary  Patient ID: Dominique [name] MRN: [id number] DOB/AGE: 1960-09-18 52 y.o.  Admit date: 02/23/2013 Discharge date: 02/24/2013  Admission Diagnoses: Left comminuted displaced radial head fracture Patient Active Problem List   Diagnosis Date Noted  . ALLERGIC RHINITIS 07/08/2010  . GERD 07/08/2010  . WEIGHT GAIN 01/13/2010  . KERATOSIS 10/09/2008  . BURSITIS, RIGHT SHOULDER 10/09/2008  . HOARSENESS, CHRONIC 03/24/2008  . OTITIS MEDIA, SEROUS, CHRONIC, RIGHT 03/26/2007  . HYPERTENSION 03/14/2007    Discharge Diagnoses: status postleft radial head arthroplasty Patient Active Problem List   Diagnosis Date Noted  . ALLERGIC RHINITIS 07/08/2010  . GERD 07/08/2010  . WEIGHT GAIN 01/13/2010  . KERATOSIS 10/09/2008  . BURSITIS, RIGHT SHOULDER 10/09/2008  . HOARSENESS, CHRONIC 03/24/2008  . OTITIS MEDIA, SEROUS, CHRONIC, RIGHT 03/26/2007  . HYPERTENSION 03/14/2007      Discharged Condition: Improved   Hospital Course: the patient is pleasant 52 year old female who is seen and evaluated in the office setting she presented slightly subacute in nature she had a comminuted displaced radial head fracture about the left upper extremity. We did see and evaluate the patient and reviewed her CT scan at length. We discussed with the patient given the degree of displacement and recommendations for repair/reconstruction most likely in the form of an arthroplasty given the high degree of comminution. All issues were discussed at length and the patient desired to proceed with surgical intervention. The patient underwent standard preoperative workup and was noted to be cleared for surgery. Patient was admitted on 02/23/2013 where she underwent a left radial head arthroplasty without complications. Please see operative report for full details. She did very well postoperatively, she was admitted to the orthopedic unit for close observation, IV antibiotics, pain control. On  postoperative day #1 the patient is doing very well she had some mild/moderate degree of pain about the left elbow has expects but this was relatively well controlled with oral medications. We discussed these issues with her at length. She was tolerating a regular diet, and voiding without difficulties. The decision was made to discharge her home to the care of her family at that juncture. Her subacute presentation we did recommend Indocin to prevent heterotopic ossification.  Consults: none   Significant Diagnostic Studies:See CT scan results   Treatments: Status post left radial head arthroplasty with ligamentous repair  Discharge Exam: Blood pressure 131/66, pulse 96, temperature 98.7 F (37.1 C), temperature source Oral, resp. rate 16, SpO2 97.00%. [name].The patient is alert and oriented in no acute distress the patient complains of pain in the affected upper extremity.  The patient is noted to have a normal HEENT exam.  Lung fields show equal chest expansion and no shortness of breath  abdomen exam is nontender without distention.  Lower extremity examination does not show any fracture dislocation or blood clot symptoms.  Pelvis is stable neck and back are stable and nontender examination of the left upper extremity shows that her splint is clean and intact. She has mild swelling of the digits and dorsal aspect of the hand her sensation and refill are intact. Radial, ulnar, median nerves are intact. No signs of infection are present  Disposition: 01-Home or Self Care  Discharge Orders   Future Orders Complete By Expires   Call MD / Call 911  As directed    Comments:     If you experience chest pain or shortness of breath, CALL 911 and be transported to the hospital emergency room.  If you develope a  fever above 101 F, pus (white drainage) or increased drainage or redness at the wound, or calf pain, call your surgeon's office.   Constipation Prevention  As directed    Comments:     Drink  plenty of fluids.  Prune juice may be helpful.  You may use a stool softener, such as Colace (over the counter) 100 mg twice a day.  Use MiraLax (over the counter) for constipation as needed.   Diet - low sodium heart healthy  As directed    Discharge instructions  As directed    Comments:     Keep splint clean and dry. Do not remove. Elevate the extremity frequently and flex and extend the digits frequently. Please massage the back of the hand frequently. Take  Medications as directed. Please call for questions or concerns   Increase activity slowly as tolerated  As directed        Medication List    STOP taking these medications       LORazepam 1 MG tablet  Commonly known as:  ATIVAN     oxyCODONE-acetaminophen 5-325 MG per tablet  Commonly known as:  PERCOCET      TAKE these medications       albuterol 108 (90 BASE) MCG/ACT inhaler  Commonly known as:  VENTOLIN HFA  Inhale 2 puffs into the lungs every 6 (six) hours as needed for wheezing or shortness of breath. For shortness of breath     beclomethasone 40 MCG/ACT inhaler  Commonly known as:  QVAR  Inhale 2 puffs into the lungs 2 (two) times daily.     cholecalciferol 1000 UNITS tablet  Commonly known as:  VITAMIN D  Take 1,000 Units by mouth daily.     dexlansoprazole 60 MG capsule  Commonly known as:  DEXILANT  Take 1 capsule (60 mg total) by mouth daily. Pt failed Nexium     estradiol 1 MG tablet  Commonly known as:  ESTRACE  Take 1 tablet (1 mg total) by mouth daily.     fluticasone 27.5 MCG/SPRAY nasal spray  Commonly known as:  VERAMYST  Place 2 sprays into the nose daily. Pt failed fluticasone     indomethacin 75 MG CR capsule  Commonly known as:  INDOCIN SR  Take 75 mg by mouth daily with breakfast.     levalbuterol 1.25 MG/3ML nebulizer solution  Commonly known as:  XOPENEX  Take 1.25 mg by nebulization every 6 (six) hours as needed for wheezing or shortness of breath. 3 ml nebs     losartan 100 MG  tablet  Commonly known as:  COZAAR  Take 1 tablet (100 mg total) by mouth daily.     methocarbamol 500 MG tablet  Commonly known as:  ROBAXIN  Take 1 tablet (500 mg total) by mouth every 6 (six) hours as needed.     montelukast 10 MG tablet  Commonly known as:  SINGULAIR  Take 1 tablet (10 mg total) by mouth at bedtime.     Nebulizer Misc  by Does not apply route. Dx: asthma     oxyCODONE 5 MG immediate release tablet  Commonly known as:  Oxy IR/ROXICODONE  Take 1 tablet (5 mg total) by mouth every 4 (four) hours as needed (take 1 to 2 every 4 to 6 hours as needed for pain).     senna-docusate 8.6-50 MG per tablet  Commonly known as:  Senokot-S  Take 1 tablet by mouth 2 (two) times daily as needed for constipation.  traMADol 50 MG tablet  Commonly known as:  ULTRAM  Take 50 mg by mouth every 4 (four) hours as needed for pain.     vitamin C 1000 MG tablet  Take 1,000 mg by mouth daily.           Follow-up Information   Follow up with Karen Chafe, MD. Schedule an appointment as soon as possible for a visit in 10 days.   Specialty:  Orthopedic Surgery   Contact information:   [address] [place] [address] [phone]       Signed: Sheran Lawless 02/24/2013, 11:27 AM

## 2013-02-24 NOTE — Evaluation (Signed)
Occupational Therapy Evaluation Patient Details Name: SELEN SMUCKER MRN: [id number] DOB: 1961-04-01 Today's Date: 02/24/2013 Time: 1478-2956 OT Time Calculation (min): 35 min  OT Assessment / Plan / Recommendation History of present illness 52 y.o. s/p LEFT RADIAL HEAD ARTHROPLASTY qwith ligament reconstruction (Left)   Clinical Impression   Educated pt and spouse. Pt moving well. Discussed ADLs as well as importance of icing, elevating, and moving digits. OT also performed and explained retrograde massage. No further OT needs at this time.     OT Assessment  Patient does not need any further OT services    Follow Up Recommendations       Barriers to Discharge      Equipment Recommendations  None recommended by OT    Recommendations for Other Services    Frequency       Precautions / Restrictions Precautions Precaution Comments: elevation, edema control, ADLs.no lifting, pulling, pushing with LUE Restrictions Weight Bearing Restrictions: Yes LUE Weight Bearing: Non weight bearing   Pertinent Vitals/Pain Pain 5/10. Repositioned.     ADL  Eating/Feeding: Set up Where Assessed - Eating/Feeding: Chair Grooming: Set up Where Assessed - Grooming: Supported sitting Upper Body Bathing: Set up;Supervision/safety Where Assessed - Upper Body Bathing: Supported sitting Lower Body Bathing: Supervision/safety;Set up Where Assessed - Lower Body Bathing: Unsupported sit to stand Upper Body Dressing: Minimal assistance Where Assessed - Upper Body Dressing: Supported sitting Lower Body Dressing: Minimal assistance Where Assessed - Lower Body Dressing: Unsupported sit to stand Toilet Transfer: Modified independent Toilet Transfer Method: Sit to Barista: Regular height toilet Tub/Shower Transfer Method: Not assessed Transfers/Ambulation Related to ADLs: Mod I for ambulation-moving slowly; Mod I for transfers. ADL Comments: Pt/husband did not feel need to  practice tub transfer. Spoke with them about sitting on shower chair. Recommended husband be with her getting in/out of tub. Educated on precautions of LUE-no pushing, pulling, lifting and she can use it for light tasks such as holding toothpaste, toothbrush, etc..Marland KitchenEducated and performed retrograde massage on fingers as they have edema. Educated on benefit of icing, elevating and moving digits frequently.  Educated to be sure not to put weight through LUE. Discussed to have rugs picked up in house.  Pt had sling in room that helped with supporting her arm that she wore during part of session-educated on proper positioning of this. Discussed dressing technique and use of button up shirts.     OT Diagnosis:    OT Problem List:   OT Treatment Interventions:     OT Goals(Current goals can be found in the care plan section)    Visit Information  Last OT Received On: 02/24/13 Assistance Needed: +1 History of Present Illness: 52 y.o. s/p LEFT RADIAL HEAD ARTHROPLASTY qwith ligament reconstruction (Left)       Prior Functioning     Home Living Family/patient expects to be discharged to:: Private residence Living Arrangements: Spouse/significant other Available Help at Discharge: Family;Available PRN/intermittently Type of Home: House Home Access: Stairs to enter [employer] of Steps: 1 Entrance Stairs-Rails: None Home Layout: Two level Alternate Level Stairs-Number of Steps: flight of steps Alternate Level Stairs-Rails: Right Home Equipment: None Prior Function Level of Independence: Independent Communication Communication: No difficulties Dominant Hand: Right         Vision/Perception Vision - History Baseline Vision: Wears glasses all the time Patient Visual Report: No change from baseline   Cognition  Cognition Arousal/Alertness: Awake/alert Behavior During Therapy: WFL for tasks assessed/performed Overall Cognitive Status: Within Functional  Limits for tasks  assessed    Extremity/Trunk Assessment Upper Extremity Assessment Upper Extremity Assessment: LUE deficits/detail LUE Sensation: decreased light touch     Mobility Bed Mobility Bed Mobility: Supine to Sit Supine to Sit: 6: Modified independent (Device/Increase time) Transfers Transfers: Sit to Stand;Stand to Sit Sit to Stand: 6: Modified independent (Device/Increase time);From bed;From toilet Stand to Sit: 6: Modified independent (Device/Increase time);To toilet;To chair/3-in-1     Exercise     Balance     End of Session OT - End of Session Activity Tolerance: Patient tolerated treatment well Patient left: in chair;with family/visitor present  GO Functional Assessment Tool Used: clinical judgment Functional Limitation: Self care Self Care Current Status (W0981): At least 1 percent but less than 20 percent impaired, limited or restricted Self Care Goal Status (X9147): At least 1 percent but less than 20 percent impaired, limited or restricted Self Care Discharge Status [id number]): At least 1 percent but less than 20 percent impaired, limited or restricted   Earlie Raveling OTR/L [phone] 02/24/2013, 11:56 AM

## 2013-02-24 NOTE — Progress Notes (Signed)
PT Cancellation/Discharge Note  Patient Details Name: Dominique [name] MRN: [id number] DOB: 01-Sep-1960   Cancelled Treatment:    Reason Eval/Treat Not Completed: PT screened, no needs identified, will sign off   Vena Austria 02/24/2013, 12:17 PM Durenda Hurt. Renaldo Fiddler, [facility] Pager [phone]

## 2013-02-27 ENCOUNTER — Encounter (HOSPITAL_COMMUNITY): Payer: Self-pay | Admitting: Orthopedic Surgery

## 2013-03-29 ENCOUNTER — Encounter (HOSPITAL_COMMUNITY): Payer: Self-pay | Admitting: Orthopedic Surgery

## 2013-03-29 NOTE — OR Nursing (Signed)
Late entry for procedure end

## 2013-04-03 ENCOUNTER — Other Ambulatory Visit: Payer: Self-pay | Admitting: Family Medicine

## 2013-04-03 ENCOUNTER — Encounter (HOSPITAL_BASED_OUTPATIENT_CLINIC_OR_DEPARTMENT_OTHER): Payer: Self-pay | Admitting: Emergency Medicine

## 2013-04-03 ENCOUNTER — Emergency Department (HOSPITAL_BASED_OUTPATIENT_CLINIC_OR_DEPARTMENT_OTHER)
Admission: EM | Admit: 2013-04-03 | Discharge: 2013-04-03 | Disposition: A | Discharge status: Home / Self Care | Payer: 59 | Attending: Emergency Medicine | Admitting: Emergency Medicine

## 2013-04-03 DIAGNOSIS — IMO0002 Reserved for concepts with insufficient information to code with codable children: Secondary | ICD-10-CM | POA: Insufficient documentation

## 2013-04-03 DIAGNOSIS — G510 Bell's palsy: Secondary | ICD-10-CM | POA: Insufficient documentation

## 2013-04-03 DIAGNOSIS — K219 Gastro-esophageal reflux disease without esophagitis: Secondary | ICD-10-CM | POA: Insufficient documentation

## 2013-04-03 DIAGNOSIS — Z8701 Personal history of pneumonia (recurrent): Secondary | ICD-10-CM | POA: Insufficient documentation

## 2013-04-03 DIAGNOSIS — Z87442 Personal history of urinary calculi: Secondary | ICD-10-CM | POA: Insufficient documentation

## 2013-04-03 DIAGNOSIS — Z79899 Other long term (current) drug therapy: Secondary | ICD-10-CM | POA: Insufficient documentation

## 2013-04-03 DIAGNOSIS — I10 Essential (primary) hypertension: Secondary | ICD-10-CM | POA: Insufficient documentation

## 2013-04-03 DIAGNOSIS — J45909 Unspecified asthma, uncomplicated: Secondary | ICD-10-CM | POA: Insufficient documentation

## 2013-04-03 DIAGNOSIS — Z8742 Personal history of other diseases of the female genital tract: Secondary | ICD-10-CM | POA: Insufficient documentation

## 2013-04-03 DIAGNOSIS — Z8619 Personal history of other infectious and parasitic diseases: Secondary | ICD-10-CM | POA: Insufficient documentation

## 2013-04-03 DIAGNOSIS — M19079 Primary osteoarthritis, unspecified ankle and foot: Secondary | ICD-10-CM | POA: Insufficient documentation

## 2013-04-03 DIAGNOSIS — Z791 Long term (current) use of non-steroidal anti-inflammatories (NSAID): Secondary | ICD-10-CM | POA: Insufficient documentation

## 2013-04-03 LAB — BASIC METABOLIC PANEL
Chloride: 100 mEq/L (ref 96–112)
GFR calc Af Amer: >90 mL/min (ref >90)
GFR calc non Af Amer: 82 mL/min — ABNORMAL LOW (ref >90)
Glucose, Bld: 211 mg/dL — ABNORMAL HIGH (ref 70–99)
Potassium: 3.6 mEq/L (ref 3.5–5.1)
Sodium: 140 mEq/L (ref 135–145)

## 2013-04-03 MED ORDER — VALACYCLOVIR HCL 500 MG PO TABS
1000.0000 mg | ORAL_TABLET | Freq: Once | ORAL | Status: DC
  Filled 2013-04-03: qty 2

## 2013-04-03 MED ORDER — PREDNISONE 50 MG PO TABS: 50.0000 mg | ORAL_TABLET | Freq: Every day | ORAL | Status: DC

## 2013-04-03 MED ORDER — VALACYCLOVIR HCL 1 G PO TABS: 1000.0000 mg | ORAL_TABLET | Freq: Once | ORAL | Status: DC

## 2013-04-03 MED ORDER — PREDNISONE 50 MG PO TABS
60.0000 mg | ORAL_TABLET | ORAL | Status: AC
  Administered 2013-04-03: 60 mg via ORAL
  Filled 2013-04-03 (×2): qty 1

## 2013-04-03 NOTE — ED Provider Notes (Addendum)
This patient was seen in conjunction with the Resident Physician, Dr. Ermalinda Memos.  On my exam the patient was in no distress.  She has a facial asymmetry consistent with Bell's palsy.  Patient has no other red flag for intracranial pathology, is awake and alert, not confused him with no dyspnea, dysphagia, aphasia.  We had a lengthy conversations with the patient and her husband about possible etiologies, treatment plan, prognosis.  I also saw the EKG, agree with the interpretation  Gerhard Munch, MD 04/03/13 1355  Gerhard Munch, MD 04/09/13 1409

## 2013-04-03 NOTE — ED Notes (Signed)
Family at bedside. 

## 2013-04-03 NOTE — ED Notes (Signed)
Pt reports she was eating this am and developed right sided facial drooping, numbness on right side of tongue and right eyelid swelling and "watery".

## 2013-04-03 NOTE — ED Notes (Signed)
MD at bedside. 

## 2013-04-03 NOTE — ED Provider Notes (Signed)
CSN: [id number]     Arrival date & time 04/03/13  0745 History   First MD Initiated Contact with Patient 04/03/13 [phone]     Chief Complaint  Patient presents with  . Numbness  . Facial Droop   (Consider location/radiation/quality/duration/timing/severity/associated sxs/prior Treatment) HPI  52-year-old female here with one outlier of right-sided facial droop and numbness. She states she was eating breakfast and she noticed the right side of her lip felt numb, her has been looked at her and felt that her smile is asymmetric, not upgoing on the right side. She denies any other weakness, numbness, confusion, or dizziness. She states she has had intermittent blurry vision scribed is mild which is now normal. She denies recent illness or any symptoms of current illness. She denies any history of stroke, diabetes, or heart disease. She recently broke her left radius and underwent surgery less than a month ago.   Past Medical History  Diagnosis Date  . Hypertension   . Endometriosis   . 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 Reactive airway disease   . Allergy     rhinitis  . Kidney stone   . GERD (gastroesophageal reflux disease)   . Asthma   . Pneumonia   . H/O hiatal hernia   . Arthritis     left foot   Past Surgical History  Procedure Laterality Date  . Tonsillectomy and adenoidectomy  1967  . Uterine lining ablation  2002  . Laparatomy  1985  . Radial head arthroplasty Left 02/23/2013    Procedure: LEFT RADIAL HEAD ARTHROPLASTY qwith ligament reconstruction;  Surgeon: Dominica Severin, MD;  Location: [facility] OR;  Service: Orthopedics;  Laterality: Left;   Family History  Problem Relation Age of Onset  . Diabetes Mother   . Hyperlipidemia Mother   . Hypertension Mother   . Diabetes Father   . Hyperlipidemia Father   . Hypertension Father    History  Substance Use Topics  . Smoking status: Never Smoker   . Smokeless tobacco: Never Used  . Alcohol Use: No   OB  History   Grav Para Term Preterm Abortions TAB SAB Ect Mult Living                 Review of Systems  Constitutional: Negative for fever, chills and appetite change.  HENT: Negative for sore throat.   Respiratory: Negative for cough and shortness of breath.   Cardiovascular: Negative for chest pain.  Gastrointestinal: Negative for abdominal pain.  Genitourinary: Negative for dysuria.  Musculoskeletal: Negative for back pain.  Skin: Negative for rash.  Neurological: Positive for facial asymmetry, weakness and numbness. Negative for tremors, seizures, syncope, light-headedness and headaches.  Psychiatric/Behavioral: Negative for confusion.  All other systems reviewed and are negative.    Allergies  Review of patient's allergies indicates no known allergies.  Home Medications   Current Outpatient Rx  Name  Route  Sig  Dispense  Refill  . albuterol (VENTOLIN HFA) 108 (90 BASE) MCG/ACT inhaler   Inhalation   Inhale 2 puffs into the lungs every 6 (six) hours as needed for wheezing or shortness of breath. For shortness of breath   3 Inhaler   0   . Ascorbic Acid (VITAMIN C) 1000 MG tablet   Oral   Take 1,000 mg by mouth daily.         . beclomethasone (QVAR) 40 MCG/ACT inhaler   Inhalation   Inhale 2 puffs into the lungs 2 (two)  times daily.   3 Inhaler   3   . cholecalciferol (VITAMIN D) 1000 UNITS tablet   Oral   Take 1,000 Units by mouth daily.         [name] dexlansoprazole (DEXILANT) 60 MG capsule   Oral   Take 1 capsule (60 mg total) by mouth daily. Pt failed Nexium   90 capsule   1   . estradiol (ESTRACE) 1 MG tablet   Oral   Take 1 tablet (1 mg total) by mouth daily.   90 tablet   1   . fluticasone (VERAMYST) 27.5 MCG/SPRAY nasal spray   Nasal   Place 2 sprays into the nose daily. Pt failed fluticasone   30 g   3   . indomethacin (INDOCIN SR) 75 MG CR capsule   Oral   Take 75 mg by mouth daily with breakfast.         . levalbuterol (XOPENEX) 1.25  MG/3ML nebulizer solution   Nebulization   Take 1.25 mg by nebulization every 6 (six) hours as needed for wheezing or shortness of breath. 3 ml nebs   72 mL   0   . losartan (COZAAR) 100 MG tablet   Oral   Take 1 tablet (100 mg total) by mouth daily.   90 tablet   1   . methocarbamol (ROBAXIN) 500 MG tablet   Oral   Take 1 tablet (500 mg total) by mouth every 6 (six) hours as needed.   40 tablet   0   . montelukast (SINGULAIR) 10 MG tablet   Oral   Take 1 tablet (10 mg total) by mouth at bedtime.   30 tablet   0   . Nebulizer MISC   Does not apply   by Does not apply route. Dx: asthma          . oxyCODONE (OXY IR/ROXICODONE) 5 MG immediate release tablet   Oral   Take 1 tablet (5 mg total) by mouth every 4 (four) hours as needed (take 1 to 2 every 4 to 6 hours as needed for pain).   30 tablet   0   . predniSONE (DELTASONE) 50 MG tablet   Oral   Take 1 tablet (50 mg total) by mouth daily with breakfast.   6 tablet   0   . senna-docusate (SENOKOT-S) 8.6-50 MG per tablet   Oral   Take 1 tablet by mouth 2 (two) times daily as needed for constipation.         . traMADol (ULTRAM) 50 MG tablet   Oral   Take 50 mg by mouth every 4 (four) hours as needed for pain.         . valACYclovir (VALTREX) 1000 MG tablet   Oral   Take 1 tablet (1,000 mg total) by mouth once.   21 tablet   0    BP 132/73  Pulse 102  Temp(Src) 98.6 F (37 C)  Resp 16  Ht 5\' 3"  (1.6 m)  Wt 168 lb (76.204 kg)  BMI 29.77 kg/m2  SpO2 100% Physical Exam  Nursing note and vitals reviewed. Constitutional: She is oriented to person, place, and time. She appears well-developed and well-nourished. No distress.  HENT:  Head: Normocephalic and atraumatic.  Eyes: EOM are normal. Pupils are equal, round, and reactive to light.  Neck: Neck supple.  Cardiovascular: Normal rate, regular rhythm and normal heart sounds.   No murmur heard. Pulmonary/Chest: Effort normal and breath sounds normal.  Abdominal: Soft. Bowel sounds are normal. There is no tenderness. There is no guarding.  Musculoskeletal: She exhibits no edema.  No erythema or pain in L elbow or wrist  Neurological: She is alert and oriented to person, place, and time. She has normal strength. She displays no atrophy and no tremor. A cranial nerve deficit and sensory deficit is present. She exhibits normal muscle tone.  Isolated CN 7 palsy Normal sensation on trigeminal distribution CN 2-12 intact otherwise Strength 5/5 and sensation intact ion all 4 extremities  Skin: Skin is warm and dry. She is not diaphoretic.  Psychiatric: She has a normal mood and affect.    ED Course  Procedures (including critical care time) Labs Review Labs Reviewed  BASIC METABOLIC PANEL - Abnormal; Notable for the following:    Glucose, Bld 211 (*)    GFR calc non Af Amer 82 (*)    All other components within normal limits   Imaging Review No results found.  EKG Interpretation    Date/Time:  Wednesday April 03 2013 07:52:38 EST Ventricular Rate:  113 PR Interval:  146 QRS Duration: 64 QT Interval:  312 QTC Calculation: 427 R Axis:   91 Text Interpretation:  Sinus tachycardia Rightward axis Low voltage QRS Septal infarct , age undetermined Abnormal ECG Confirmed by Gerhard Munch  MD [phone]) on 04/03/2013 8:05:18 AM            MDM   1. Bell's palsy     52 year old female here with sudden onset right-sided facial droop and numbness of her buccal mucosa. His exam reveals isolated cranial nerve 7 deficit. Her clinical picture is consistent with bell's palsy, CVA was initially considered but felt unlikely with isolated finding on neuro exam. Will treat with 7 day course of prednisone and valtrex. BMP significan t for hyperglycemia, howver she just finished a short course of steroids by Dr. Amanda Pea for swelling of her recently broken hand.   Discusssed red flags for return, recommended f/u with PCP and [facility] neuro if  symptoms do not resolve in 1-2 weeks.     Elenora Gamma, MD 04/03/13 [phone]

## 2013-04-03 NOTE — ED Notes (Signed)
Patient changed into gown, waist up.  MD, RN , & RT at bedside.

## 2013-04-05 ENCOUNTER — Encounter: Payer: Self-pay | Admitting: Family Medicine

## 2013-04-05 ENCOUNTER — Ambulatory Visit (INDEPENDENT_AMBULATORY_CARE_PROVIDER_SITE_OTHER): Payer: 59 | Admitting: Family Medicine

## 2013-04-05 VITALS — BP 146/74 | HR 100 | Wt 169.0 lb

## 2013-04-05 DIAGNOSIS — G510 Bell's palsy: Secondary | ICD-10-CM

## 2013-04-05 MED ORDER — MONTELUKAST SODIUM 10 MG PO TABS: ORAL_TABLET | ORAL | Status: DC

## 2013-04-05 MED ORDER — DEXLANSOPRAZOLE 60 MG PO CPDR: DELAYED_RELEASE_CAPSULE | ORAL | Status: DC

## 2013-04-05 NOTE — Progress Notes (Signed)
Pre visit review using our clinic review tool, if applicable. No additional management support is needed unless otherwise documented below in the visit note. 

## 2013-04-05 NOTE — Progress Notes (Signed)
  Subjective:    Patient ID: Dominique [name], female    DOB: March 16, 1961, 52 y.o.   MRN: [id number]  HPI Pt here f/u ED for bells palsy.  Pt facial droop on R side.  Pt was given prednisone and valtrex on 19th.  She is also on lyrica for nerve pain s/p fractured elbow.  Er visit reviewed   Review of Systems    as above Objective:   Physical Exam BP 146/74  Pulse 100  Wt 169 lb (76.658 kg)  SpO2 98% General appearance: alert, cooperative, appears stated age and no distress Skin: Skin color, texture, turgor normal. No rashes or lesions Neurologic: Cranial nerves: VII: upper facial muscle function r facial droop on the right, VII: lower facial muscle function droop on the right       Assessment & Plan:

## 2013-04-05 NOTE — Assessment & Plan Note (Signed)
Finish prednisone Finish valtrex Pt wanted to hold off on bld work Consider neuro if no improvement in few weeks rto prn

## 2013-04-05 NOTE — Patient Instructions (Signed)
Bell's Palsy  Bell's palsy is a condition in which the muscles on one side of the face cannot move (paralysis). This is because the nerves in the face are paralyzed. It is most often thought to be caused by a virus. The virus causes swelling of the nerve that controls movement on one side of the face. The nerve travels through a tight space surrounded by bone. When the nerve swells, it can be compressed by the bone. This results in damage to the protective covering around the nerve. This damage interferes with how the nerve communicates with the muscles of the face. As a result, it can cause weakness or paralysis of the facial muscles.   Injury (trauma), tumor, and surgery may cause Bell's palsy, but most of the time the cause is unknown. It is a relatively common condition. It starts suddenly (abrupt onset) with the paralysis usually ending within 2 days. Bell's palsy is not dangerous. But because the eye does not close properly, you may need care to keep the eye from getting dry. This can include splinting (to keep the eye shut) or moistening with artificial tears. Bell's palsy very seldom occurs on both sides of the face at the same time.  SYMPTOMS    Eyebrow sagging.   Drooping of the eyelid and corner of the mouth.   Inability to close one eye.   Loss of taste on the front of the tongue.   Sensitivity to loud noises.  TREATMENT   The treatment is usually non-surgical. If the patient is seen within the first 24 to 48 hours, a short course of steroids may be prescribed, in an attempt to shorten the length of the condition. Antiviral medicines may also be used with the steroids, but it is unclear if they are helpful.   You will need to protect your eye, if you cannot close it. The cornea (clear covering over your eye) will become dry and can be damaged. Artificial tears can be used to keep your eye moist. Glasses or an eye patch should be worn to protect your eye.  PROGNOSIS   Recovery is variable, ranging  from days to months. Although the problem usually goes away completely (about 80% of cases resolve), predicting the outcome is impossible. Most people improve within 3 weeks of when the symptoms began. Improvement may continue for 3 to 6 months. A small number of people have moderate to severe weakness that is permanent.   HOME CARE INSTRUCTIONS    If your caregiver prescribed medication to reduce swelling in the nerve, use as directed. Do not stop taking the medication unless directed by your caregiver.   Use moisturizing eye drops as needed to prevent drying of your eye, as directed by your caregiver.   Protect your eye, as directed by your caregiver.   Use facial massage and exercises, as directed by your caregiver.   Perform your normal activities, and get your normal rest.  SEEK IMMEDIATE MEDICAL CARE IF:    There is pain, redness or irritation in the eye.   You or your child has an oral temperature above 102 F (38.9 C), not controlled by medicine.  MAKE SURE YOU:    Understand these instructions.   Will watch your condition.   Will get help right away if you are not doing well or get worse.  Document Released: 05/02/2005 Document Revised: 07/25/2011 Document Reviewed: 05/11/2009  ExitCare Patient Information 2014 [employer].

## 2013-04-15 ENCOUNTER — Ambulatory Visit (INDEPENDENT_AMBULATORY_CARE_PROVIDER_SITE_OTHER): Payer: 59 | Admitting: Family Medicine

## 2013-04-15 ENCOUNTER — Encounter: Payer: Self-pay | Admitting: Family Medicine

## 2013-04-15 VITALS — BP 122/72 | HR 106 | Temp 98.6°F | Wt 170.6 lb

## 2013-04-15 DIAGNOSIS — G510 Bell's palsy: Secondary | ICD-10-CM

## 2013-04-15 DIAGNOSIS — J019 Acute sinusitis, unspecified: Secondary | ICD-10-CM

## 2013-04-15 MED ORDER — CEFUROXIME AXETIL 500 MG PO TABS: 500.0000 mg | ORAL_TABLET | Freq: Two times a day (BID) | ORAL | Status: AC

## 2013-04-15 NOTE — Progress Notes (Signed)
  Subjective:     Dominique [name] is a 52 y.o. female who presents for evaluation of sinus pain. Symptoms include: congestion, facial pain, nasal congestion and sinus pressure. Onset of symptoms was several days ago. Symptoms have been gradually worsening since that time. Past history is significant for no history of pneumonia or bronchitis. Patient is a non-smoker.  The following portions of the patient's history were reviewed and updated as appropriate: allergies, current medications, past family history, past medical history, past social history, past surgical history and problem list.  Review of Systems Pertinent items are noted in HPI.   Objective:    BP 122/72  Pulse 106  Temp(Src) 98.6 F (37 C) (Oral)  Wt 170 lb 9.6 oz (77.384 kg)  SpO2 97% General appearance: alert, cooperative, appears stated age and mild distress Ears: R TM-- + cerumen,   Nose: green discharge, moderate congestion, turbinates red, swollen, sinus tenderness bilateral Throat: lips, mucosa, and tongue normal; teeth and gums normal Neck: mild anterior cervical adenopathy, supple, symmetrical, trachea midline and thyroid not enlarged, symmetric, no tenderness/mass/nodules Lungs: clear to auscultation bilaterally Heart: S1, S2 normal Lymph nodes: Cervical adenopathy: mild, b/l    Assessment:    Acute bacterial sinusitis.    Plan:    Nasal steroids per medication orders. Antihistamines per medication orders. Ceftin per medication orders. f/u prn

## 2013-04-15 NOTE — Patient Instructions (Signed)
Bell's Palsy  Bell's palsy is a condition in which the muscles on one side of the face cannot move (paralysis). This is because the nerves in the face are paralyzed. It is most often thought to be caused by a virus. The virus causes swelling of the nerve that controls movement on one side of the face. The nerve travels through a tight space surrounded by bone. When the nerve swells, it can be compressed by the bone. This results in damage to the protective covering around the nerve. This damage interferes with how the nerve communicates with the muscles of the face. As a result, it can cause weakness or paralysis of the facial muscles.   Injury (trauma), tumor, and surgery may cause Bell's palsy, but most of the time the cause is unknown. It is a relatively common condition. It starts suddenly (abrupt onset) with the paralysis usually ending within 2 days. Bell's palsy is not dangerous. But because the eye does not close properly, you may need care to keep the eye from getting dry. This can include splinting (to keep the eye shut) or moistening with artificial tears. Bell's palsy very seldom occurs on both sides of the face at the same time.  SYMPTOMS    Eyebrow sagging.   Drooping of the eyelid and corner of the mouth.   Inability to close one eye.   Loss of taste on the front of the tongue.   Sensitivity to loud noises.  TREATMENT   The treatment is usually non-surgical. If the patient is seen within the first 24 to 48 hours, a short course of steroids may be prescribed, in an attempt to shorten the length of the condition. Antiviral medicines may also be used with the steroids, but it is unclear if they are helpful.   You will need to protect your eye, if you cannot close it. The cornea (clear covering over your eye) will become dry and can be damaged. Artificial tears can be used to keep your eye moist. Glasses or an eye patch should be worn to protect your eye.  PROGNOSIS   Recovery is variable, ranging  from days to months. Although the problem usually goes away completely (about 80% of cases resolve), predicting the outcome is impossible. Most people improve within 3 weeks of when the symptoms began. Improvement may continue for 3 to 6 months. A small number of people have moderate to severe weakness that is permanent.   HOME CARE INSTRUCTIONS    If your caregiver prescribed medication to reduce swelling in the nerve, use as directed. Do not stop taking the medication unless directed by your caregiver.   Use moisturizing eye drops as needed to prevent drying of your eye, as directed by your caregiver.   Protect your eye, as directed by your caregiver.   Use facial massage and exercises, as directed by your caregiver.   Perform your normal activities, and get your normal rest.  SEEK IMMEDIATE MEDICAL CARE IF:    There is pain, redness or irritation in the eye.   You or your child has an oral temperature above 102 F (38.9 C), not controlled by medicine.  MAKE SURE YOU:    Understand these instructions.   Will watch your condition.   Will get help right away if you are not doing well or get worse.  Document Released: 05/02/2005 Document Revised: 07/25/2011 Document Reviewed: 05/11/2009  ExitCare Patient Information 2014 [employer].

## 2013-04-15 NOTE — Assessment & Plan Note (Signed)
Valtrex and pred finished Pt given HO

## 2013-04-15 NOTE — Progress Notes (Signed)
Pre visit review using our clinic review tool, if applicable. No additional management support is needed unless otherwise documented below in the visit note. 

## 2013-04-28 ENCOUNTER — Other Ambulatory Visit: Payer: Self-pay | Admitting: Family Medicine

## 2013-05-16 DIAGNOSIS — G510 Bell's palsy: Secondary | ICD-10-CM

## 2013-05-16 HISTORY — DX: Bell's palsy: G51.0

## 2013-06-11 ENCOUNTER — Ambulatory Visit (INDEPENDENT_AMBULATORY_CARE_PROVIDER_SITE_OTHER): Payer: 59 | Admitting: Family Medicine

## 2013-06-11 ENCOUNTER — Encounter: Payer: Self-pay | Admitting: Family Medicine

## 2013-06-11 VITALS — BP 120/80 | HR 94 | Temp 98.5°F | Wt 169.0 lb

## 2013-06-11 DIAGNOSIS — J209 Acute bronchitis, unspecified: Secondary | ICD-10-CM

## 2013-06-11 DIAGNOSIS — J019 Acute sinusitis, unspecified: Secondary | ICD-10-CM

## 2013-06-11 MED ORDER — GUAIFENESIN-CODEINE 100-10 MG/5ML PO SYRP: ORAL_SOLUTION | ORAL | Status: DC

## 2013-06-11 MED ORDER — AMOXICILLIN-POT CLAVULANATE 875-125 MG PO TABS: 1.0000 | ORAL_TABLET | Freq: Two times a day (BID) | ORAL | Status: DC

## 2013-06-11 MED ORDER — GABAPENTIN 100 MG PO CAPS: 100.0000 mg | ORAL_CAPSULE | Freq: Three times a day (TID) | ORAL | Status: DC

## 2013-06-11 NOTE — Progress Notes (Signed)
Pre visit review using our clinic review tool, if applicable. No additional management support is needed unless otherwise documented below in the visit note. 

## 2013-06-11 NOTE — Patient Instructions (Signed)

## 2013-06-12 ENCOUNTER — Other Ambulatory Visit: Payer: Self-pay | Admitting: *Deleted

## 2013-06-12 ENCOUNTER — Telehealth: Payer: Self-pay

## 2013-06-12 DIAGNOSIS — J209 Acute bronchitis, unspecified: Secondary | ICD-10-CM

## 2013-06-12 MED ORDER — AMOXICILLIN-POT CLAVULANATE 875-125 MG PO TABS: 1.0000 | ORAL_TABLET | Freq: Two times a day (BID) | ORAL | Status: DC

## 2013-06-12 NOTE — Progress Notes (Signed)
  Subjective:     Willaim Shengatricia A [name] is a 53 y.o. female who presents for evaluation of sinus pain. Symptoms include: congestion, cough, headaches, nasal congestion, purulent rhinorrhea and sinus pressure. Onset of symptoms was several weeks ago. Symptoms have been gradually worsening since that time. Past history is significant for asthma. Patient is a non-smoker.  The following portions of the patient's history were reviewed and updated as appropriate: allergies, current medications, past family history, past medical history, past social history, past surgical history and problem list.  Review of Systems Pertinent items are noted in HPI.   Objective:    BP 120/80  Pulse 94  Temp(Src) 98.5 F (36.9 C) (Oral)  Wt 169 lb (76.658 kg)  SpO2 97% General appearance: alert, cooperative, appears stated age and no distress Ears: normal TM's and external ear canals both ears Nose: green discharge, moderate congestion, turbinates red, swollen, sinus tenderness bilateral Throat: lips, mucosa, and tongue normal; teeth and gums normal Neck: mild anterior cervical adenopathy, supple, symmetrical, trachea midline and thyroid not enlarged, symmetric, no tenderness/mass/nodules Lungs: clear to auscultation bilaterally Heart: S1, S2 normal    Assessment:    Acute bacterial sinusitis.    Plan:    Nasal steroids per medication orders. Antihistamines per medication orders. Biaxin per medication orders. f/u prn

## 2013-06-12 NOTE — Telephone Encounter (Signed)
Patient called and states that the pharmacy does not have her Rx. Spoke with pharmacy who states the Rx has been filled for 6 tabs which will cover till Friday. Rx was sent to mail order in error and they cannot bill insurance until Friday at which time they will complete the Rx. Advised the patient of new information. Apologized for the inconvenience this has caused her.

## 2013-06-17 ENCOUNTER — Telehealth: Payer: Self-pay

## 2013-06-17 MED ORDER — FLUCONAZOLE 150 MG PO TABS: ORAL_TABLET | ORAL | Status: DC

## 2013-06-17 NOTE — Telephone Encounter (Signed)
Letter from patient with concerns about [facility]. Spoke with patient and while in the office her Abx was sent to [facility],she he was told she called multiple times and left multiple messages finally spoke with someone after 2 days and was told she could pick up six pills from [facility] and it will be free, The patient said she was lied too and the medication was not free, she said she her medication came in on Saturday and she had to now pay a second time. After reviewing the chart the [facility] and [facility] by [facility] Hal[facility]ica G the next day.Gaye handled the call the next day but no documentation of the medication be cancelled with the mail order company. The cost of the Augmentin was 20 dollars coming from [facility] and the patient would like a refund and also an Rx for diflucan. I spoke with Arlys JohnBrian and Harriett SineNancy who both agreed to refund the 20 dollars back to the patient. I left a message for the patient making her aware and also apologizing for the inconvenience. D[facility]lucan has been sent to [facility] per patient request.     KP

## 2013-06-17 NOTE — Telephone Encounter (Signed)
noted 

## 2013-06-25 NOTE — Telephone Encounter (Signed)
Email sent to [employer] regarding issue.

## 2013-11-24 ENCOUNTER — Other Ambulatory Visit: Payer: Self-pay | Admitting: Family Medicine

## 2013-11-26 ENCOUNTER — Ambulatory Visit (INDEPENDENT_AMBULATORY_CARE_PROVIDER_SITE_OTHER): Payer: 59 | Admitting: Family Medicine

## 2013-11-26 ENCOUNTER — Encounter: Payer: Self-pay | Admitting: Family Medicine

## 2013-11-26 VITALS — BP 116/80 | HR 91 | Temp 98.1°F | Wt 173.0 lb

## 2013-11-26 DIAGNOSIS — I10 Essential (primary) hypertension: Secondary | ICD-10-CM

## 2013-11-26 DIAGNOSIS — G510 Bell's palsy: Secondary | ICD-10-CM

## 2013-11-26 DIAGNOSIS — J45909 Unspecified asthma, uncomplicated: Secondary | ICD-10-CM

## 2013-11-26 MED ORDER — NONFORMULARY OR COMPOUNDED ITEM: Status: DC

## 2013-11-26 MED ORDER — ALBUTEROL SULFATE HFA 108 (90 BASE) MCG/ACT IN AERS: 2.0000 | INHALATION_SPRAY | Freq: Four times a day (QID) | RESPIRATORY_TRACT | Status: DC | PRN

## 2013-11-26 MED ORDER — LOSARTAN POTASSIUM 100 MG PO TABS: ORAL_TABLET | ORAL | Status: DC

## 2013-11-26 MED ORDER — FLUTICASONE FUROATE 27.5 MCG/SPRAY NA SUSP: 2.0000 | Freq: Every day | NASAL | Status: DC

## 2013-11-26 MED ORDER — BECLOMETHASONE DIPROPIONATE 40 MCG/ACT IN AERS: 2.0000 | INHALATION_SPRAY | Freq: Two times a day (BID) | RESPIRATORY_TRACT | Status: DC

## 2013-11-26 MED ORDER — LEVALBUTEROL HCL 1.25 MG/3ML IN NEBU: 1.2500 mg | INHALATION_SOLUTION | Freq: Four times a day (QID) | RESPIRATORY_TRACT | Status: DC | PRN

## 2013-11-26 MED ORDER — DEXLANSOPRAZOLE 60 MG PO CPDR: DELAYED_RELEASE_CAPSULE | ORAL | Status: DC

## 2013-11-26 MED ORDER — MONTELUKAST SODIUM 10 MG PO TABS: ORAL_TABLET | ORAL | Status: DC

## 2013-11-26 NOTE — Patient Instructions (Signed)
Bell's Palsy °Bell's palsy is a condition in which the muscles on one side of the face cannot move (paralysis). This is because the nerves in the face are paralyzed. It is most often thought to be caused by a virus. The virus causes swelling of the nerve that controls movement on one side of the face. The nerve travels through a tight space surrounded by bone. When the nerve swells, it can be compressed by the bone. This results in damage to the protective covering around the nerve. This damage interferes with how the nerve communicates with the muscles of the face. As a result, it can cause weakness or paralysis of the facial muscles.  °Injury (trauma), tumor, and surgery may cause Bell's palsy, but most of the time the cause is unknown. It is a relatively common condition. It starts suddenly (abrupt onset) with the paralysis usually ending within 2 days. Bell's palsy is not dangerous. But because the eye does not close properly, you may need care to keep the eye from getting dry. This can include splinting (to keep the eye shut) or moistening with artificial tears. Bell's palsy very seldom occurs on both sides of the face at the same time. °SYMPTOMS  °· Eyebrow sagging. °· Drooping of the eyelid and corner of the mouth. °· Inability to close one eye. °· Loss of taste on the front of the tongue. °· Sensitivity to loud noises. °TREATMENT  °The treatment is usually non-surgical. If the patient is seen within the first 24 to 48 hours, a short course of steroids may be prescribed, in an attempt to shorten the length of the condition. Antiviral medicines may also be used with the steroids, but it is unclear if they are helpful.  °You will need to protect your eye, if you cannot close it. The cornea (clear covering over your eye) will become dry and can be damaged. Artificial tears can be used to keep your eye moist. Glasses or an eye patch should be worn to protect your eye. °PROGNOSIS  °Recovery is variable, ranging  from days to months. Although the problem usually goes away completely (about 80% of cases resolve), predicting the outcome is impossible. Most people improve within 3 weeks of when the symptoms began. Improvement may continue for 3 to 6 months. A small number of people have moderate to severe weakness that is permanent.  °HOME CARE INSTRUCTIONS  °· If your caregiver prescribed medication to reduce swelling in the nerve, use as directed. Do not stop taking the medication unless directed by your caregiver. °· Use moisturizing eye drops as needed to prevent drying of your eye, as directed by your caregiver. °· Protect your eye, as directed by your caregiver. °· Use facial massage and exercises, as directed by your caregiver. °· Perform your normal activities, and get your normal rest. °SEEK IMMEDIATE MEDICAL CARE IF:  °· There is pain, redness or irritation in the eye. °· You or your child has an oral temperature above 102° F (38.9° C), not controlled by medicine. °MAKE SURE YOU:  °· Understand these instructions. °· Will watch your condition. °· Will get help right away if you are not doing well or get worse. °Document Released: 05/02/2005 Document Revised: 07/25/2011 Document Reviewed: 05/11/2009 °ExitCare® Patient Information ©2015 [employer]. This information is not intended to replace advice given to you by your health care provider. Make sure you discuss any questions you have with your health care provider. ° °

## 2013-11-26 NOTE — Progress Notes (Signed)
   Subjective:    Patient ID: Dominique [name], female    DOB: 08/27/1960, 53 y.o.   MRN: [id number]  HPI Pt here f/u bp and get refills.  She c/o R ear feeling clogged.  She is also c/o cont symptoms of bells palsy.     Review of Systems As above     Objective:   Physical Exam BP 116/80  Pulse 91  Temp(Src) 98.1 F (36.7 C) (Oral)  Wt 173 lb (78.472 kg)  SpO2 98% General appearance: alert, cooperative, appears stated age and no distress Ears: r ear--+ cerumen Nose: Nares normal. Septum midline. Mucosa normal. No drainage or sinus tenderness. Throat: lips, mucosa, and tongue normal; teeth and gums normal Neck: no adenopathy, no carotid bruit, no JVD, supple, symmetrical, trachea midline and thyroid not enlarged, symmetric, no tenderness/mass/nodules Lungs: clear to auscultation bilaterally Heart: regular rate and rhythm, S1, S2 normal, no murmur, click, rub or gallop Extremities: extremities normal, atraumatic, no cyanosis or edema       Assessment & Plan:  1. Unspecified asthma(493.90) Refill meds-- stable - albuterol (VENTOLIN HFA) 108 (90 BASE) MCG/ACT inhaler; Inhale 2 puffs into the lungs every 6 (six) hours as needed for wheezing or shortness of breath. For shortness of breath  Dispense: 3 Inhaler; Refill: 1 - beclomethasone (QVAR) 40 MCG/ACT inhaler; Inhale 2 puffs into the lungs 2 (two) times daily.  Dispense: 3 Inhaler; Refill: 3 - fluticasone (VERAMYST) 27.5 MCG/SPRAY nasal spray; Place 2 sprays into the nose daily. Pt failed fluticasone  Dispense: 30 g; Refill: 3  2. Bell's palsy Still has not resolved-- pt requesting to see neuro - Ambulatory referral to Neurology - NONFORMULARY OR COMPOUNDED ITEM; Lyme titre,  RMSF titre,  Rpr,  Dx bells palsy  Dispense: 1 each; Refill: 0  3. Essential hypertension con't meds, stable - losartan (COZAAR) 100 MG tablet; 1 tab by mouth daily  Dispense: 30 tablet; Refill: 0 4 R cerumen impaction-- use debrox for 1 week and rto  for irrigation prn

## 2013-11-26 NOTE — Progress Notes (Signed)
Pre visit review using our clinic review tool, if applicable. No additional management support is needed unless otherwise documented below in the visit note. 

## 2013-11-27 ENCOUNTER — Telehealth: Payer: Self-pay | Admitting: Family Medicine

## 2013-11-27 NOTE — Telephone Encounter (Signed)
Relevant patient education assigned to patient using Emmi. ° °

## 2013-11-29 ENCOUNTER — Telehealth: Payer: Self-pay | Admitting: Family Medicine

## 2013-11-29 NOTE — Telephone Encounter (Signed)
Caller name: Elease Hashimotoatricia  Call back number: [phone]   Reason for call:  Pt states that she was sent to [facility] to do a lab test for Lime Diease on 7/14 and they told her we would have the results by 7/16.  Pt wold like an update on this please.

## 2013-11-29 NOTE — Telephone Encounter (Signed)
A user error has taken place.

## 2013-11-29 NOTE — Telephone Encounter (Signed)
Dr Beverely Lowabori reviewed [facility] report and gave of to report negative results to patient.  Advised neg for past history of [place] Spotted Fever. Patient verbalizes understanding.

## 2013-12-12 ENCOUNTER — Encounter: Payer: Self-pay | Admitting: Family Medicine

## 2013-12-13 ENCOUNTER — Telehealth: Payer: Self-pay | Admitting: Family Medicine

## 2013-12-13 MED ORDER — NEBULIZER MISC: Status: DC

## 2013-12-13 NOTE — Telephone Encounter (Signed)
Ok for new neb---dx asthma

## 2013-12-13 NOTE — Telephone Encounter (Signed)
The facility is closed I will call on Monday to get the fax number  [phone]

## 2013-12-13 NOTE — Telephone Encounter (Signed)
Please advise      KP 

## 2013-12-13 NOTE — Telephone Encounter (Signed)
Caller name:Rendy Relation to pt: Call back number:[phone] Pharmacy: [facility] [address]  Reason for call: pt states her nebulizer is breaking. She would like a new Rx

## 2013-12-18 ENCOUNTER — Telehealth: Payer: Self-pay

## 2013-12-18 NOTE — Telephone Encounter (Signed)
[name] with [facility] called requesting last OV notes for patient. Faxed to [phone]

## 2013-12-23 ENCOUNTER — Telehealth: Payer: Self-pay

## 2013-12-23 NOTE — Telephone Encounter (Signed)
Received Confirmation of Order from [facility] regarding nebulizer equipment.  Forms were labeled and placed in Dr. Ernst SpellLowne's red folder for completion, review, and signature.

## 2013-12-26 NOTE — Telephone Encounter (Signed)
Received signed Confirmation of Order from Dr. Laury AxonLowne.  All forms faxed to [facility] at [phone]).  Confirmation received.//AB/CMA

## 2014-01-03 ENCOUNTER — Ambulatory Visit (INDEPENDENT_AMBULATORY_CARE_PROVIDER_SITE_OTHER): Payer: 59 | Admitting: Neurology

## 2014-01-03 ENCOUNTER — Encounter: Payer: Self-pay | Admitting: Neurology

## 2014-01-03 ENCOUNTER — Telehealth: Payer: Self-pay | Admitting: *Deleted

## 2014-01-03 VITALS — BP 104/66 | HR 88 | Temp 98.7°F | Resp 20 | Ht 63.0 in | Wt 173.5 lb

## 2014-01-03 DIAGNOSIS — G510 Bell's palsy: Secondary | ICD-10-CM

## 2014-01-03 MED ORDER — GABAPENTIN 300 MG PO CAPS: 300.0000 mg | ORAL_CAPSULE | Freq: Three times a day (TID) | ORAL | Status: DC

## 2014-01-03 MED ORDER — DIAZEPAM 5 MG PO TABS: ORAL_TABLET | ORAL | Status: DC

## 2014-01-03 NOTE — Patient Instructions (Addendum)
We will get MRI of the brain to look for other cause of facial nerve weakness.  I will prescribe a Valium 5mg  to take 20-30 minutes prior to MRI.  Will need somebody to drive you home. We will check some other blood tests to look for other causes:  Sed Rate, ANA, ACE, CBC with diff Try gabapentin 300mg  capsules for mouth discomfort.  Try 1cap at bedtime for one week.  May increase to 1cap twice daily for one week and then 1cap three times daily if needed.  Side effects discussed nausea, sleepiness, or dizziness. Use the eye drops.  If concerned you are not keeping your eyes closed at night consider LacriLube and apply to eye at night and wear an eye patch. Follow up after MRI and tests.

## 2014-01-03 NOTE — Telephone Encounter (Signed)
Called [facility] and cancelled Rx for Gabapentin per Dr. Moises BloodJaffe's request.  Patient requested printed Rx.  Spoke with Seward GraterMaggie, pharmacist.

## 2014-01-03 NOTE — Progress Notes (Signed)
NEUROLOGY CONSULTATION NOTE  [name] MRN: [id number] DOB: 11-Aug-1960  Referring provider: Dr. Laury AxonLowne Primary care provider: Dr. Laury AxonLowne  Reason for consult:  Bell's Palsy  HISTORY OF PRESENT ILLNESS: Dominique [name] is a 53 year old right-handed woman with history of hypertension, asthma and GERD who presents for Bell's palsy.  She is accompanied by her husband.  On 04/03/13, she woke up and went downstairs for a cup of tea.  When she drank the tea, it spilled out of her mouth.  She looked at herself in the mirror and noted that the right side of her face was drooping.  She also noted that the right side of her face felt numb.  Her sense of taste was different.  She presented to the ED, where she was diagnosed with Bell's palsy and was given a course of prednisone and Valtrex.  Soon afterwards, she developed pain in the right ear as well.  For several months, the symptoms were unchanged.  She began to notice some improvement beginning around March or April 2015.  The ear pain has resolved.  She is able to close her right eye much better.  Her eye feels a little irritated.  She does not have visual disturbance.  She had her eyes examined and did not have any corneal abrasions.  She was advised to use eye drops.  The inside of her mouth is not painful but feels uncomfortable, like after having drank a hot drink.  Taste is still a problem.  On 11/26/13, Lyme IgG/IgM antibodies and RMSF was negative.  Prior to onset of symptoms, she denied any viral illness, travel or exposure to tick.  PAST MEDICAL HISTORY: Past Medical History  Diagnosis Date  . Hypertension   . Endometriosis   . Histoplasmosis 2002    history of, [place]  . Reactive airway disease   . Allergy     rhinitis  . Kidney stone   . GERD (gastroesophageal reflux disease)   . Asthma   . Pneumonia   . H/O hiatal hernia   . Arthritis     left foot    PAST SURGICAL HISTORY: Past Surgical History  Procedure  Laterality Date  . Tonsillectomy and adenoidectomy  1967  . Uterine lining ablation  2002  . Laparatomy  1985  . Radial head arthroplasty Left 02/23/2013    Procedure: LEFT RADIAL HEAD ARTHROPLASTY qwith ligament reconstruction;  Surgeon: Dominica SeverinWilliam Gramig, MD;  Location: [facility] OR;  Service: Orthopedics;  Laterality: Left;    MEDICATIONS: Current Outpatient Prescriptions on File Prior to Visit  Medication Sig Dispense Refill  . albuterol (VENTOLIN HFA) 108 (90 BASE) MCG/ACT inhaler Inhale 2 puffs into the lungs every 6 (six) hours as needed for wheezing or shortness of breath. For shortness of breath  3 Inhaler  1  . Ascorbic Acid (VITAMIN C) 1000 MG tablet Take 1,000 mg by mouth daily.      . beclomethasone (QVAR) 40 MCG/ACT inhaler Inhale 2 puffs into the lungs 2 (two) times daily.  3 Inhaler  3  . calcium carbonate (OS-CAL - DOSED IN MG OF ELEMENTAL CALCIUM) 1250 MG tablet Take 1 tablet by mouth.      . cholecalciferol (VITAMIN D) 1000 UNITS tablet Take 1,000 Units by mouth daily.      [name]. dexlansoprazole (DEXILANT) 60 MG capsule Take 1 capsule by mouth  daily  90 capsule  3  . fluticasone (VERAMYST) 27.5 MCG/SPRAY nasal spray Place 2 sprays into the nose daily.  Pt failed fluticasone  30 g  3  . levalbuterol (XOPENEX) 1.25 MG/3ML nebulizer solution Take 1.25 mg by nebulization every 6 (six) hours as needed for wheezing or shortness of breath. 3 ml nebs  72 mL  1  . losartan (COZAAR) 100 MG tablet 1 tab by mouth daily  30 tablet  0  . montelukast (SINGULAIR) 10 MG tablet Take 1 tablet by mouth at  bedtime  90 tablet  3  . Nebulizer MISC Use Nebulizer device with tubing as needed for Asthma  1 each  0  . NONFORMULARY OR COMPOUNDED ITEM Lyme titre,  RMSF titre,  Rpr,  Dx bells palsy  1 each  0   No current facility-administered medications on file prior to visit.    ALLERGIES: No Known Allergies  FAMILY HISTORY: Family History  Problem Relation Age of Onset  . Diabetes Mother   .  Hyperlipidemia Mother   . Hypertension Mother   . Diabetes Father   . Hyperlipidemia Father   . Hypertension Father     SOCIAL HISTORY: History   Social History  . Marital Status: Married    Spouse Name: N/A    Number of Children: N/A  . Years of Education: N/A   Occupational History  . Not on file.   Social History Main Topics  . Smoking status: Never Smoker   . Smokeless tobacco: Never Used  . Alcohol Use: No  . Drug Use: No  . Sexual Activity: Yes    Partners: Male   Other Topics Concern  . Not on file   Social History Narrative  . No narrative on file    REVIEW OF SYSTEMS: Constitutional: No fevers, chills, or sweats, no generalized fatigue, change in appetite Eyes: No visual changes, double vision, eye pain.  Some mild right eye discomfort. Ear, nose and throat: No hearing loss, ear pain, nasal congestion, sore throat Cardiovascular: No chest pain, palpitations Respiratory:  No shortness of breath at rest or with exertion, wheezes GastrointestinaI: No nausea, vomiting, diarrhea, abdominal pain, fecal incontinence Genitourinary:  No dysuria, urinary retention or frequency Musculoskeletal:  Left arm pain related to injury. Integumentary: No rash, pruritus, skin lesions Neurological: as above Psychiatric: No depression, insomnia, anxiety Endocrine: No palpitations, fatigue, diaphoresis, mood swings, change in appetite, change in weight, increased thirst Hematologic/Lymphatic:  No anemia, purpura, petechiae. Allergic/Immunologic: no itchy/runny eyes, nasal congestion, recent allergic reactions, rashes  PHYSICAL EXAM: Filed Vitals:   01/03/14 0915  BP: 104/66  Pulse: 88  Temp: 98.7 F (37.1 C)  Resp: 20   General: No acute distress Head:  Normocephalic/atraumatic, ear canals without lesions Neck: supple, no paraspinal tenderness, full range of motion.  No mass lesions palpated. Back: No paraspinal tenderness Heart: regular rate and rhythm Lungs: Clear  to auscultation bilaterally. Vascular: No carotid bruits. Neurological Exam: Mental status: alert and oriented to person, place, and time, recent and remote memory intact, fund of knowledge intact, attention and concentration intact, speech fluent and not dysarthric, language intact. Cranial nerves: CN I: not tested CN II: pupils equal, round and reactive to light, visual fields intact, fundi unremarkable, without vessel changes, exudates, hemorrhages or papilledema. CN III, IV, VI:  full range of motion, no nystagmus, no ptosis CN V: facial sensation intact CN VII: right upper and lower facial weakness CN VIII: hearing intact CN IX, X: gag intact, uvula midline CN XI: sternocleidomastoid and trapezius muscles intact CN XII: tongue midline Bulk & Tone: normal, no fasciculations. Motor: 5/5 throughout Sensation:  temperature and vibration intact. Deep Tendon Reflexes: 2+ throughout, toes downgoing Finger to nose testing: no dysmetria Heel to shin: no dysmetria Gait: normal station and stride.  Able to turn and walk in tandem. Romberg negative.  IMPRESSION: Right facial nerve palsy, likely Bell's palsy.  Delay improvement in symptoms may suggest poorer prognosis of complete resolution, but the fact that she has improvement is reassuring.  Since the symptoms have not completely resolved for several months, then we will rule out other etiologies.  PLAN: 1.  MRI of the brain with and without contrast.  She is claustrophobic, so we will send her for open MRI and with Valium 5mg  (her husband will drive her). 2.  Check serum for ANA, Sed Rate and ACE 3.  For mouth discomfort, will try gabapentin.  She can try 300mg  at bedtime for 1 week.  If no improvement, then 300mg  twice daily for 1 week and then 300mg  three times daily if needed. 4.  Follow up after testing.  Thank you for allowing me to take part in the care of this patient.  Shon Millet, DO  CC: Loreen Freud, DO

## 2014-01-09 ENCOUNTER — Telehealth: Payer: Self-pay | Admitting: Neurology

## 2014-01-09 NOTE — Telephone Encounter (Signed)
Pt called requesting to speak to a nurse regarding her MRI that is scheduled for this coming Sunday 01/12/14. She also wants to f/u on the results for her blood work she had done last Friday 01/03/14. C/B [phone]

## 2014-01-12 ENCOUNTER — Inpatient Hospital Stay: Admission: RE | Admit: 2014-01-12 | Payer: 59 | Source: Ambulatory Visit

## 2014-01-29 ENCOUNTER — Telehealth: Payer: Self-pay | Admitting: Neurology

## 2014-01-29 NOTE — Telephone Encounter (Signed)
Pt called to cancel her f/u on 02/03/14. She wants to wait and see if her meds work for her and if not She will call back to r/s.

## 2014-02-03 ENCOUNTER — Ambulatory Visit: Payer: 59 | Admitting: Neurology

## 2014-03-19 ENCOUNTER — Telehealth: Payer: Self-pay | Admitting: Family Medicine

## 2014-03-19 NOTE — Telephone Encounter (Signed)
Caller name: Hase, PaFelipa Evenertricia A Relation to pt: self  Call back number: Willaim Shenghompson, Tamala A   Reason for call: Pt works for [employer] requesting a rx for lab work full panel. Pt feels like she is border line diabetic

## 2014-03-20 NOTE — Telephone Encounter (Signed)
cpe scheduled  °

## 2014-03-20 NOTE — Telephone Encounter (Signed)
No CPE on file, patient has only been tot he office for acute's. Please advise     KP

## 2014-03-20 NOTE — Telephone Encounter (Signed)
Pt would need ov ---- I have no code to put on labs

## 2014-03-20 NOTE — Telephone Encounter (Signed)
Patient calling in regarding this.  °

## 2014-03-20 NOTE — Telephone Encounter (Signed)
She needs a CPE.     KP

## 2014-03-24 ENCOUNTER — Ambulatory Visit (INDEPENDENT_AMBULATORY_CARE_PROVIDER_SITE_OTHER): Payer: 59 | Admitting: Family Medicine

## 2014-03-24 ENCOUNTER — Encounter: Payer: Self-pay | Admitting: Family Medicine

## 2014-03-24 VITALS — BP 116/74 | HR 89 | Temp 98.6°F | Ht 63.5 in | Wt 159.6 lb

## 2014-03-24 DIAGNOSIS — M21612 Bunion of left foot: Secondary | ICD-10-CM

## 2014-03-24 DIAGNOSIS — M21611 Bunion of right foot: Secondary | ICD-10-CM

## 2014-03-24 DIAGNOSIS — Z0000 Encounter for general adult medical examination without abnormal findings: Secondary | ICD-10-CM

## 2014-03-24 DIAGNOSIS — M2012 Hallux valgus (acquired), left foot: Secondary | ICD-10-CM

## 2014-03-24 DIAGNOSIS — Z1239 Encounter for other screening for malignant neoplasm of breast: Secondary | ICD-10-CM

## 2014-03-24 DIAGNOSIS — Z23 Encounter for immunization: Secondary | ICD-10-CM

## 2014-03-24 DIAGNOSIS — Z8632 Personal history of gestational diabetes: Secondary | ICD-10-CM

## 2014-03-24 DIAGNOSIS — I10 Essential (primary) hypertension: Secondary | ICD-10-CM

## 2014-03-24 DIAGNOSIS — M2011 Hallux valgus (acquired), right foot: Secondary | ICD-10-CM

## 2014-03-24 LAB — HEPATIC FUNCTION PANEL
ALT: 57 U/L — AB (ref 7–35)
AST: 36 U/L — AB (ref 13–35)
Alkaline Phosphatase: 121 U/L (ref 25–125)
BILIRUBIN DIRECT: 0.34 mg/dL (ref 0.01–0.4)
Bilirubin, Total: 1.7 mg/dL

## 2014-03-24 LAB — BASIC METABOLIC PANEL
BUN: 14 mg/dL (ref 4–21)
Creatinine: 0.8 mg/dL (ref 0.5–1.1)
Glucose: 313 mg/dL
Potassium: 4.3 mmol/L (ref 3.4–5.3)
Sodium: 137 mmol/L (ref 137–147)

## 2014-03-24 LAB — LIPID PANEL
Cholesterol: 192 mg/dL (ref 0–200)
HDL: 35 mg/dL (ref 35–70)
LDL CALC: 119 mg/dL
Triglycerides: 192 mg/dL — AB (ref 40–160)

## 2014-03-24 LAB — CBC AND DIFFERENTIAL
HEMATOCRIT: 48 % — AB (ref 36–46)
Hemoglobin: 16.5 g/dL — AB (ref 12.0–16.0)
NEUTROS ABS: 62 /uL
PLATELETS: 166 10*3/uL (ref 150–399)
WBC: 4.9 10^3/mL

## 2014-03-24 LAB — TSH: TSH: 1.56 u[IU]/mL (ref 0.41–5.90)

## 2014-03-24 LAB — HEMOGLOBIN A1C: Hgb A1c MFr Bld: 11.9 % — AB (ref 4.0–6.0)

## 2014-03-24 MED ORDER — NONFORMULARY OR COMPOUNDED ITEM: Status: DC

## 2014-03-24 NOTE — Progress Notes (Signed)
Pre visit review using our clinic review tool, if applicable. No additional management support is needed unless otherwise documented below in the visit note. 

## 2014-03-24 NOTE — Progress Notes (Signed)
Subjective:     Dominique [name] is a 53 y.o. female and is here for a comprehensive physical exam. The patient reports no new problems---still struggling with bells palsy.  History   Social History  . Marital Status: Married    Spouse Name: N/A    Number of Children: N/A  . Years of Education: N/A   Occupational History  .      unemployed   Social History Main Topics  . Smoking status: Never Smoker   . Smokeless tobacco: Never Used  . Alcohol Use: No  . Drug Use: No  . Sexual Activity:    Partners: Male   Other Topics Concern  . Not on file   Social History Narrative   Exercise-- no   Health Maintenance  Topic Date Due  . COLONOSCOPY  09/13/2008  . MAMMOGRAM  08/08/2012  . PAP SMEAR  08/08/2013  . TETANUS/TDAP  08/09/2014  . INFLUENZA VACCINE  12/15/2014    The following portions of the patient's history were reviewed and updated as appropriate:  She  has a past medical history of Hypertension; Endometriosis; Histoplasmosis (2002); Reactive airway disease; Allergy; Kidney stone; GERD (gastroesophageal reflux disease); Asthma; Pneumonia; H/O hiatal hernia; and Arthritis. She  does not have any pertinent problems on file. She  has past surgical history that includes Tonsillectomy and adenoidectomy (1967); uterine lining ablation (2002); laparatomy (1985); and Radial head arthroplasty (Left, 02/23/2013). Her family history includes Diabetes in her father and mother; Hyperlipidemia in her father and mother; Hypertension in her father and mother. She  reports that she has never smoked. She has never used smokeless tobacco. She reports that she does not drink alcohol or use illicit drugs. She has a current medication list which includes the following prescription(s): albuterol, vitamin c, beclomethasone, calcium carbonate, cholecalciferol, dexlansoprazole, fluticasone, levalbuterol, losartan, montelukast, and NONFORMULARY OR COMPOUNDED ITEM. Current Outpatient  Prescriptions on File Prior to Visit  Medication Sig Dispense Refill  . albuterol (VENTOLIN HFA) 108 (90 BASE) MCG/ACT inhaler Inhale 2 puffs into the lungs every 6 (six) hours as needed for wheezing or shortness of breath. For shortness of breath 3 Inhaler 1  . Ascorbic Acid (VITAMIN C) 1000 MG tablet Take 1,000 mg by mouth daily.    . beclomethasone (QVAR) 40 MCG/ACT inhaler Inhale 2 puffs into the lungs 2 (two) times daily. 3 Inhaler 3  . calcium carbonate (OS-CAL - DOSED IN MG OF ELEMENTAL CALCIUM) 1250 MG tablet Take 1 tablet by mouth.    . cholecalciferol (VITAMIN D) 1000 UNITS tablet Take 1,000 Units by mouth daily.    [name]. dexlansoprazole (DEXILANT) 60 MG capsule Take 1 capsule by mouth  daily 90 capsule 3  . fluticasone (VERAMYST) 27.5 MCG/SPRAY nasal spray Place 2 sprays into the nose daily. Pt failed fluticasone 30 g 3  . levalbuterol (XOPENEX) 1.25 MG/3ML nebulizer solution Take 1.25 mg by nebulization every 6 (six) hours as needed for wheezing or shortness of breath. 3 ml nebs 72 mL 1  . losartan (COZAAR) 100 MG tablet 1 tab by mouth daily 30 tablet 0  . montelukast (SINGULAIR) 10 MG tablet Take 1 tablet by mouth at  bedtime 90 tablet 3   No current facility-administered medications on file prior to visit.   She has No Known Allergies..  Review of Systems Review of Systems  Constitutional: Negative for activity change, appetite change and fatigue.  HENT: Negative for hearing loss, congestion, tinnitus and ear discharge.  dentist [id number] Eyes: Negative for visual  disturbance (see optho q1y -- vision corrected to 20/20 with glasses).  Respiratory: Negative for cough, chest tightness and shortness of breath.   Cardiovascular: Negative for chest pain, palpitations and leg swelling.  Gastrointestinal: Negative for abdominal pain, diarrhea, constipation and abdominal distention.  Genitourinary: Negative for urgency, frequency, decreased urine volume and difficulty urinating.   Musculoskeletal: Negative for back pain, arthralgias and gait problem.  Skin: Negative for color change, pallor and rash.  Neurological: Negative for dizziness, light-headedness, numbness and headaches.  Hematological: Negative for adenopathy. Does not bruise/bleed easily.  Psychiatric/Behavioral: Negative for suicidal ideas, confusion, sleep disturbance, self-injury, dysphoric mood, decreased concentration and agitation.       Objective:    BP 116/74 mmHg  Pulse 89  Temp(Src) 98.6 F (37 C) (Oral)  Ht 5' 3.5" (1.613 m)  Wt 159 lb 9.6 oz (72.394 kg)  BMI 27.82 kg/m2  SpO2 98% General appearance: alert, cooperative, appears stated age and no distress Head: Normocephalic, without obvious abnormality, atraumatic Eyes: negative findings: lids and lashes normal, conjunctivae and sclerae normal and pupils equal, round, reactive to light and accomodation Ears: normal TM's and external ear canals both ears Nose: Nares normal. Septum midline. Mucosa normal. No drainage or sinus tenderness. Throat: lips, mucosa, and tongue normal; teeth and gums normal Neck: no adenopathy, no carotid bruit, no JVD, supple, symmetrical, trachea midline and thyroid not enlarged, symmetric, no tenderness/mass/nodules Back: symmetric, no curvature. ROM normal. No CVA tenderness. Lungs: clear to auscultation bilaterally Breasts: gyn Heart: regular rate and rhythm, S1, S2 normal, no murmur, click, rub or gallop Abdomen: soft, non-tender; bowel sounds normal; no masses,  no organomegaly Pelvic: deferred--gyn Extremities: extremities normal, atraumatic, no cyanosis or edema Pulses: 2+ and symmetric Skin: Skin color, texture, turgor normal. No rashes or lesions Lymph nodes: Cervical, supraclavicular, and axillary nodes normal. Neurologic: R facial droop from bells palsy Psych- no depression, no anxiety      Assessment:    Healthy female exam.      Plan:    ghm utd Check labs See After Visit Summary for  Counseling Recommendations    1. Need for prophylactic vaccination and inoculation against influenza  - Flu Vaccine QUAD 36+ mos PF IM (Fluarix Quad PF)  2. Bilateral bunions  - Ambulatory referral to Podiatry  3. Preventative health care  - NONFORMULARY OR COMPOUNDED ITEM; Cbcd, bmp, hep, lipid, tsh, ua, hgba1c----V70.0--complete physical, htn, hx gestational dm  Dispense: 1 each; Refill: 0 - Ambulatory referral to Gastroenterology  4. Essential hypertension stable - NONFORMULARY OR COMPOUNDED ITEM; Cbcd, bmp, hep, lipid, tsh, ua, hgba1c----V70.0--complete physical, htn, hx gestational dm  Dispense: 1 each; Refill: 0  5. History of gestational diabetes   - NONFORMULARY OR COMPOUNDED ITEM; Cbcd, bmp, hep, lipid, tsh, ua, hgba1c----V70.0--complete physical, htn, hx gestational dm  Dispense: 1 each; Refill: 0  6. Breast cancer screening   - MM DIGITAL SCREENING BILATERAL; Future

## 2014-03-24 NOTE — Patient Instructions (Signed)
Preventive Care for Adults A healthy lifestyle and preventive care can promote health and wellness. Preventive health guidelines for women include the following key practices.  A routine yearly physical is a good way to check with your health care provider about your health and preventive screening. It is a chance to share any concerns and updates on your health and to receive a thorough exam.  Visit your dentist for a routine exam and preventive care every 6 months. Brush your teeth twice a day and floss once a day. Good oral hygiene prevents tooth decay and gum disease.  The frequency of eye exams is based on your age, health, family medical history, use of contact lenses, and other factors. Follow your health care provider's recommendations for frequency of eye exams.  Eat a healthy diet. Foods like vegetables, fruits, whole grains, low-fat dairy products, and lean protein foods contain the nutrients you need without too many calories. Decrease your intake of foods high in solid fats, added sugars, and salt. Eat the right amount of calories for you.Get information about a proper diet from your health care provider, if necessary.  Regular physical exercise is one of the most important things you can do for your health. Most adults should get at least 150 minutes of moderate-intensity exercise (any activity that increases your heart rate and causes you to sweat) each week. In addition, most adults need muscle-strengthening exercises on 2 or more days a week.  Maintain a healthy weight. The body mass index (BMI) is a screening tool to identify possible weight problems. It provides an estimate of body fat based on height and weight. Your health care provider can find your BMI and can help you achieve or maintain a healthy weight.For adults 20 years and older:  A BMI below 18.5 is considered underweight.  A BMI of 18.5 to 24.9 is normal.  A BMI of 25 to 29.9 is considered overweight.  A BMI of  30 and above is considered obese.  Maintain normal blood lipids and cholesterol levels by exercising and minimizing your intake of saturated fat. Eat a balanced diet with plenty of fruit and vegetables. Blood tests for lipids and cholesterol should begin at age 76 and be repeated every 5 years. If your lipid or cholesterol levels are high, you are over 50, or you are at high risk for heart disease, you may need your cholesterol levels checked more frequently.Ongoing high lipid and cholesterol levels should be treated with medicines if diet and exercise are not working.  If you smoke, find out from your health care provider how to quit. If you do not use tobacco, do not start.  Lung cancer screening is recommended for adults aged 22-80 years who are at high risk for developing lung cancer because of a history of smoking. A yearly low-dose CT scan of the lungs is recommended for people who have at least a 30-pack-year history of smoking and are a current smoker or have quit within the past 15 years. A pack year of smoking is smoking an average of 1 pack of cigarettes a day for 1 year (for example: 1 pack a day for 30 years or 2 packs a day for 15 years). Yearly screening should continue until the smoker has stopped smoking for at least 15 years. Yearly screening should be stopped for people who develop a health problem that would prevent them from having lung cancer treatment.  If you are pregnant, do not drink alcohol. If you are breastfeeding,  be very cautious about drinking alcohol. If you are not pregnant and choose to drink alcohol, do not have more than 1 drink per day. One drink is considered to be 12 ounces (355 mL) of beer, 5 ounces (148 mL) of wine, or 1.5 ounces (44 mL) of liquor.  Avoid use of street drugs. Do not share needles with anyone. Ask for help if you need support or instructions about stopping the use of drugs.  High blood pressure causes heart disease and increases the risk of  stroke. Your blood pressure should be checked at least every 1 to 2 years. Ongoing high blood pressure should be treated with medicines if weight loss and exercise do not work.  If you are 3-86 years old, ask your health care provider if you should take aspirin to prevent strokes.  Diabetes screening involves taking a blood sample to check your fasting blood sugar level. This should be done once every 3 years, after age 67, if you are within normal weight and without risk factors for diabetes. Testing should be considered at a younger age or be carried out more frequently if you are overweight and have at least 1 risk factor for diabetes.  Breast cancer screening is essential preventive care for women. You should practice "breast self-awareness." This means understanding the normal appearance and feel of your breasts and may include breast self-examination. Any changes detected, no matter how small, should be reported to a health care provider. Women in their 8s and 30s should have a clinical breast exam (CBE) by a health care provider as part of a regular health exam every 1 to 3 years. After age 70, women should have a CBE every year. Starting at age 25, women should consider having a mammogram (breast X-ray test) every year. Women who have a family history of breast cancer should talk to their health care provider about genetic screening. Women at a high risk of breast cancer should talk to their health care providers about having an MRI and a mammogram every year.  Breast cancer gene (BRCA)-related cancer risk assessment is recommended for women who have family members with BRCA-related cancers. BRCA-related cancers include breast, ovarian, tubal, and peritoneal cancers. Having family members with these cancers may be associated with an increased risk for harmful changes (mutations) in the breast cancer genes BRCA1 and BRCA2. Results of the assessment will determine the need for genetic counseling and  BRCA1 and BRCA2 testing.  Routine pelvic exams to screen for cancer are no longer recommended for nonpregnant women who are considered low risk for cancer of the pelvic organs (ovaries, uterus, and vagina) and who do not have symptoms. Ask your health care provider if a screening pelvic exam is right for you.  If you have had past treatment for cervical cancer or a condition that could lead to cancer, you need Pap tests and screening for cancer for at least 20 years after your treatment. If Pap tests have been discontinued, your risk factors (such as having a new sexual partner) need to be reassessed to determine if screening should be resumed. Some women have medical problems that increase the chance of getting cervical cancer. In these cases, your health care provider may recommend more frequent screening and Pap tests.  The HPV test is an additional test that may be used for cervical cancer screening. The HPV test looks for the virus that can cause the cell changes on the cervix. The cells collected during the Pap test can be  tested for HPV. The HPV test could be used to screen women aged 30 years and older, and should be used in women of any age who have unclear Pap test results. After the age of 30, women should have HPV testing at the same frequency as a Pap test.  Colorectal cancer can be detected and often prevented. Most routine colorectal cancer screening begins at the age of 50 years and continues through age 75 years. However, your health care provider may recommend screening at an earlier age if you have risk factors for colon cancer. On a yearly basis, your health care provider may provide home test kits to check for hidden blood in the stool. Use of a small camera at the end of a tube, to directly examine the colon (sigmoidoscopy or colonoscopy), can detect the earliest forms of colorectal cancer. Talk to your health care provider about this at age 50, when routine screening begins. Direct  exam of the colon should be repeated every 5-10 years through age 75 years, unless early forms of pre-cancerous polyps or small growths are found.  People who are at an increased risk for hepatitis B should be screened for this virus. You are considered at high risk for hepatitis B if:  You were born in a country where hepatitis B occurs often. Talk with your health care provider about which countries are considered high risk.  Your parents were born in a high-risk country and you have not received a shot to protect against hepatitis B (hepatitis B vaccine).  You have HIV or AIDS.  You use needles to inject street drugs.  You live with, or have sex with, someone who has hepatitis B.  You get hemodialysis treatment.  You take certain medicines for conditions like cancer, organ transplantation, and autoimmune conditions.  Hepatitis C blood testing is recommended for all people born from 1945 through 1965 and any individual with known risks for hepatitis C.  Practice safe sex. Use condoms and avoid high-risk sexual practices to reduce the spread of sexually transmitted infections (STIs). STIs include gonorrhea, chlamydia, syphilis, trichomonas, herpes, HPV, and human immunodeficiency virus (HIV). Herpes, HIV, and HPV are viral illnesses that have no cure. They can result in disability, cancer, and death.  You should be screened for sexually transmitted illnesses (STIs) including gonorrhea and chlamydia if:  You are sexually active and are younger than 24 years.  You are older than 24 years and your health care provider tells you that you are at risk for this type of infection.  Your sexual activity has changed since you were last screened and you are at an increased risk for chlamydia or gonorrhea. Ask your health care provider if you are at risk.  If you are at risk of being infected with HIV, it is recommended that you take a prescription medicine daily to prevent HIV infection. This is  called preexposure prophylaxis (PrEP). You are considered at risk if:  You are a heterosexual woman, are sexually active, and are at increased risk for HIV infection.  You take drugs by injection.  You are sexually active with a partner who has HIV.  Talk with your health care provider about whether you are at high risk of being infected with HIV. If you choose to begin PrEP, you should first be tested for HIV. You should then be tested every 3 months for as long as you are taking PrEP.  Osteoporosis is a disease in which the bones lose minerals and strength   with aging. This can result in serious bone fractures or breaks. The risk of osteoporosis can be identified using a bone density scan. Women ages 65 years and over and women at risk for fractures or osteoporosis should discuss screening with their health care providers. Ask your health care provider whether you should take a calcium supplement or vitamin D to reduce the rate of osteoporosis.  Menopause can be associated with physical symptoms and risks. Hormone replacement therapy is available to decrease symptoms and risks. You should talk to your health care provider about whether hormone replacement therapy is right for you.  Use sunscreen. Apply sunscreen liberally and repeatedly throughout the day. You should seek shade when your shadow is shorter than you. Protect yourself by wearing long sleeves, pants, a wide-brimmed hat, and sunglasses year round, whenever you are outdoors.  Once a month, do a whole body skin exam, using a mirror to look at the skin on your back. Tell your health care provider of new moles, moles that have irregular borders, moles that are larger than a pencil eraser, or moles that have changed in shape or color.  Stay current with required vaccines (immunizations).  Influenza vaccine. All adults should be immunized every year.  Tetanus, diphtheria, and acellular pertussis (Td, Tdap) vaccine. Pregnant women should  receive 1 dose of Tdap vaccine during each pregnancy. The dose should be obtained regardless of the length of time since the last dose. Immunization is preferred during the 27th-36th week of gestation. An adult who has not previously received Tdap or who does not know her vaccine status should receive 1 dose of Tdap. This initial dose should be followed by tetanus and diphtheria toxoids (Td) booster doses every 10 years. Adults with an unknown or incomplete history of completing a 3-dose immunization series with Td-containing vaccines should begin or complete a primary immunization series including a Tdap dose. Adults should receive a Td booster every 10 years.  Varicella vaccine. An adult without evidence of immunity to varicella should receive 2 doses or a second dose if she has previously received 1 dose. Pregnant females who do not have evidence of immunity should receive the first dose after pregnancy. This first dose should be obtained before leaving the health care facility. The second dose should be obtained 4-8 weeks after the first dose.  Human papillomavirus (HPV) vaccine. Females aged 13-26 years who have not received the vaccine previously should obtain the 3-dose series. The vaccine is not recommended for use in pregnant females. However, pregnancy testing is not needed before receiving a dose. If a female is found to be pregnant after receiving a dose, no treatment is needed. In that case, the remaining doses should be delayed until after the pregnancy. Immunization is recommended for any person with an immunocompromised condition through the age of 26 years if she did not get any or all doses earlier. During the 3-dose series, the second dose should be obtained 4-8 weeks after the first dose. The third dose should be obtained 24 weeks after the first dose and 16 weeks after the second dose.  Zoster vaccine. One dose is recommended for adults aged 60 years or older unless certain conditions are  present.  Measles, mumps, and rubella (MMR) vaccine. Adults born before 1957 generally are considered immune to measles and mumps. Adults born in 1957 or later should have 1 or more doses of MMR vaccine unless there is a contraindication to the vaccine or there is laboratory evidence of immunity to   each of the three diseases. A routine second dose of MMR vaccine should be obtained at least 28 days after the first dose for students attending postsecondary schools, health care workers, or international travelers. People who received inactivated measles vaccine or an unknown type of measles vaccine during 1963-1967 should receive 2 doses of MMR vaccine. People who received inactivated mumps vaccine or an unknown type of mumps vaccine before 1979 and are at high risk for mumps infection should consider immunization with 2 doses of MMR vaccine. For females of childbearing age, rubella immunity should be determined. If there is no evidence of immunity, females who are not pregnant should be vaccinated. If there is no evidence of immunity, females who are pregnant should delay immunization until after pregnancy. Unvaccinated health care workers born before 1957 who lack laboratory evidence of measles, mumps, or rubella immunity or laboratory confirmation of disease should consider measles and mumps immunization with 2 doses of MMR vaccine or rubella immunization with 1 dose of MMR vaccine.  Pneumococcal 13-valent conjugate (PCV13) vaccine. When indicated, a person who is uncertain of her immunization history and has no record of immunization should receive the PCV13 vaccine. An adult aged 19 years or older who has certain medical conditions and has not been previously immunized should receive 1 dose of PCV13 vaccine. This PCV13 should be followed with a dose of pneumococcal polysaccharide (PPSV23) vaccine. The PPSV23 vaccine dose should be obtained at least 8 weeks after the dose of PCV13 vaccine. An adult aged 19  years or older who has certain medical conditions and previously received 1 or more doses of PPSV23 vaccine should receive 1 dose of PCV13. The PCV13 vaccine dose should be obtained 1 or more years after the last PPSV23 vaccine dose.  Pneumococcal polysaccharide (PPSV23) vaccine. When PCV13 is also indicated, PCV13 should be obtained first. All adults aged 65 years and older should be immunized. An adult younger than age 65 years who has certain medical conditions should be immunized. Any person who resides in a nursing home or long-term care facility should be immunized. An adult smoker should be immunized. People with an immunocompromised condition and certain other conditions should receive both PCV13 and PPSV23 vaccines. People with human immunodeficiency virus (HIV) infection should be immunized as soon as possible after diagnosis. Immunization during chemotherapy or radiation therapy should be avoided. Routine use of PPSV23 vaccine is not recommended for American Indians, [place] Natives, or people younger than 65 years unless there are medical conditions that require PPSV23 vaccine. When indicated, people who have unknown immunization and have no record of immunization should receive PPSV23 vaccine. One-time revaccination 5 years after the first dose of PPSV23 is recommended for people aged 19-64 years who have chronic kidney failure, nephrotic syndrome, asplenia, or immunocompromised conditions. People who received 1-2 doses of PPSV23 before age 65 years should receive another dose of PPSV23 vaccine at age 65 years or later if at least 5 years have passed since the previous dose. Doses of PPSV23 are not needed for people immunized with PPSV23 at or after age 65 years.  Meningococcal vaccine. Adults with asplenia or persistent complement component deficiencies should receive 2 doses of quadrivalent meningococcal conjugate (MenACWY-D) vaccine. The doses should be obtained at least 2 months apart.  Microbiologists working with certain meningococcal bacteria, military recruits, people at risk during an outbreak, and people who travel to or live in countries with a high rate of meningitis should be immunized. A first-year college student up through age   21 years who is living in a residence hall should receive a dose if she did not receive a dose on or after her 16th birthday. Adults who have certain high-risk conditions should receive one or more doses of vaccine.  Hepatitis A vaccine. Adults who wish to be protected from this disease, have certain high-risk conditions, work with hepatitis A-infected animals, work in hepatitis A research labs, or travel to or work in countries with a high rate of hepatitis A should be immunized. Adults who were previously unvaccinated and who anticipate close contact with an international adoptee during the first 60 days after arrival in the [place] from a country with a high rate of hepatitis A should be immunized.  Hepatitis B vaccine. Adults who wish to be protected from this disease, have certain high-risk conditions, may be exposed to blood or other infectious body fluids, are household contacts or sex partners of hepatitis B positive people, are clients or workers in certain care facilities, or travel to or work in countries with a high rate of hepatitis B should be immunized.  Haemophilus influenzae type b (Hib) vaccine. A previously unvaccinated person with asplenia or sickle cell disease or having a scheduled splenectomy should receive 1 dose of Hib vaccine. Regardless of previous immunization, a recipient of a hematopoietic stem cell transplant should receive a 3-dose series 6-12 months after her successful transplant. Hib vaccine is not recommended for adults with HIV infection. Preventive Services / Frequency Ages 64 to 68 years  Blood pressure check.** / Every 1 to 2 years.  Lipid and cholesterol check.** / Every 5 years beginning at age  22.  Clinical breast exam.** / Every 3 years for women in their 88s and 53s.  BRCA-related cancer risk assessment.** / For women who have family members with a BRCA-related cancer (breast, ovarian, tubal, or peritoneal cancers).  Pap test.** / Every 2 years from ages 90 through 51. Every 3 years starting at age 21 through age 56 or 3 with a history of 3 consecutive normal Pap tests.  HPV screening.** / Every 3 years from ages 24 through ages 1 to 46 with a history of 3 consecutive normal Pap tests.  Hepatitis C blood test.** / For any individual with known risks for hepatitis C.  Skin self-exam. / Monthly.  Influenza vaccine. / Every year.  Tetanus, diphtheria, and acellular pertussis (Tdap, Td) vaccine.** / Consult your health care provider. Pregnant women should receive 1 dose of Tdap vaccine during each pregnancy. 1 dose of Td every 10 years.  Varicella vaccine.** / Consult your health care provider. Pregnant females who do not have evidence of immunity should receive the first dose after pregnancy.  HPV vaccine. / 3 doses over 6 months, if 72 and younger. The vaccine is not recommended for use in pregnant females. However, pregnancy testing is not needed before receiving a dose.  Measles, mumps, rubella (MMR) vaccine.** / You need at least 1 dose of MMR if you were born in 1957 or later. You may also need a 2nd dose. For females of childbearing age, rubella immunity should be determined. If there is no evidence of immunity, females who are not pregnant should be vaccinated. If there is no evidence of immunity, females who are pregnant should delay immunization until after pregnancy.  Pneumococcal 13-valent conjugate (PCV13) vaccine.** / Consult your health care provider.  Pneumococcal polysaccharide (PPSV23) vaccine.** / 1 to 2 doses if you smoke cigarettes or if you have certain conditions.  Meningococcal vaccine.** /  1 dose if you are age 19 to 21 years and a first-year college  student living in a residence hall, or have one of several medical conditions, you need to get vaccinated against meningococcal disease. You may also need additional booster doses.  Hepatitis A vaccine.** / Consult your health care provider.  Hepatitis B vaccine.** / Consult your health care provider.  Haemophilus influenzae type b (Hib) vaccine.** / Consult your health care provider. Ages 40 to 64 years  Blood pressure check.** / Every 1 to 2 years.  Lipid and cholesterol check.** / Every 5 years beginning at age 20 years.  Lung cancer screening. / Every year if you are aged 55-80 years and have a 30-pack-year history of smoking and currently smoke or have quit within the past 15 years. Yearly screening is stopped once you have quit smoking for at least 15 years or develop a health problem that would prevent you from having lung cancer treatment.  Clinical breast exam.** / Every year after age 40 years.  BRCA-related cancer risk assessment.** / For women who have family members with a BRCA-related cancer (breast, ovarian, tubal, or peritoneal cancers).  Mammogram.** / Every year beginning at age 40 years and continuing for as long as you are in good health. Consult with your health care provider.  Pap test.** / Every 3 years starting at age 30 years through age 65 or 70 years with a history of 3 consecutive normal Pap tests.  HPV screening.** / Every 3 years from ages 30 years through ages 65 to 70 years with a history of 3 consecutive normal Pap tests.  Fecal occult blood test (FOBT) of stool. / Every year beginning at age 50 years and continuing until age 75 years. You may not need to do this test if you get a colonoscopy every 10 years.  Flexible sigmoidoscopy or colonoscopy.** / Every 5 years for a flexible sigmoidoscopy or every 10 years for a colonoscopy beginning at age 50 years and continuing until age 75 years.  Hepatitis C blood test.** / For all people born from 1945 through  1965 and any individual with known risks for hepatitis C.  Skin self-exam. / Monthly.  Influenza vaccine. / Every year.  Tetanus, diphtheria, and acellular pertussis (Tdap/Td) vaccine.** / Consult your health care provider. Pregnant women should receive 1 dose of Tdap vaccine during each pregnancy. 1 dose of Td every 10 years.  Varicella vaccine.** / Consult your health care provider. Pregnant females who do not have evidence of immunity should receive the first dose after pregnancy.  Zoster vaccine.** / 1 dose for adults aged 60 years or older.  Measles, mumps, rubella (MMR) vaccine.** / You need at least 1 dose of MMR if you were born in 1957 or later. You may also need a 2nd dose. For females of childbearing age, rubella immunity should be determined. If there is no evidence of immunity, females who are not pregnant should be vaccinated. If there is no evidence of immunity, females who are pregnant should delay immunization until after pregnancy.  Pneumococcal 13-valent conjugate (PCV13) vaccine.** / Consult your health care provider.  Pneumococcal polysaccharide (PPSV23) vaccine.** / 1 to 2 doses if you smoke cigarettes or if you have certain conditions.  Meningococcal vaccine.** / Consult your health care provider.  Hepatitis A vaccine.** / Consult your health care provider.  Hepatitis B vaccine.** / Consult your health care provider.  Haemophilus influenzae type b (Hib) vaccine.** / Consult your health care provider. Ages 65   years and over  Blood pressure check.** / Every 1 to 2 years.  Lipid and cholesterol check.** / Every 5 years beginning at age 22 years.  Lung cancer screening. / Every year if you are aged 73-80 years and have a 30-pack-year history of smoking and currently smoke or have quit within the past 15 years. Yearly screening is stopped once you have quit smoking for at least 15 years or develop a health problem that would prevent you from having lung cancer  treatment.  Clinical breast exam.** / Every year after age 4 years.  BRCA-related cancer risk assessment.** / For women who have family members with a BRCA-related cancer (breast, ovarian, tubal, or peritoneal cancers).  Mammogram.** / Every year beginning at age 40 years and continuing for as long as you are in good health. Consult with your health care provider.  Pap test.** / Every 3 years starting at age 9 years through age 34 or 91 years with 3 consecutive normal Pap tests. Testing can be stopped between 65 and 70 years with 3 consecutive normal Pap tests and no abnormal Pap or HPV tests in the past 10 years.  HPV screening.** / Every 3 years from ages 57 years through ages 64 or 45 years with a history of 3 consecutive normal Pap tests. Testing can be stopped between 65 and 70 years with 3 consecutive normal Pap tests and no abnormal Pap or HPV tests in the past 10 years.  Fecal occult blood test (FOBT) of stool. / Every year beginning at age 15 years and continuing until age 17 years. You may not need to do this test if you get a colonoscopy every 10 years.  Flexible sigmoidoscopy or colonoscopy.** / Every 5 years for a flexible sigmoidoscopy or every 10 years for a colonoscopy beginning at age 86 years and continuing until age 71 years.  Hepatitis C blood test.** / For all people born from [birth year] through 1967 and any individual with known risks for hepatitis C.  Osteoporosis screening.** / A one-time screening for women ages 83 years and over and women at risk for fractures or osteoporosis.  Skin self-exam. / Monthly.  Influenza vaccine. / Every year.  Tetanus, diphtheria, and acellular pertussis (Tdap/Td) vaccine.** / 1 dose of Td every 10 years.  Varicella vaccine.** / Consult your health care provider.  Zoster vaccine.** / 1 dose for adults aged 61 years or older.  Pneumococcal 13-valent conjugate (PCV13) vaccine.** / Consult your health care provider.  Pneumococcal  polysaccharide (PPSV23) vaccine.** / 1 dose for all adults aged 28 years and older.  Meningococcal vaccine.** / Consult your health care provider.  Hepatitis A vaccine.** / Consult your health care provider.  Hepatitis B vaccine.** / Consult your health care provider.  Haemophilus influenzae type b (Hib) vaccine.** / Consult your health care provider. ** Family history and personal history of risk and conditions may change your health care provider's recommendations. Document Released: 06/28/2001 Document Revised: 09/16/2013 Document Reviewed: 09/27/2010 [facility] Patient Information 2015 [place], [place]. This information is not intended to replace advice given to you by your health care provider. Make sure you discuss any questions you have with your health care provider.

## 2014-03-27 ENCOUNTER — Encounter: Payer: Self-pay | Admitting: Family Medicine

## 2014-03-27 DIAGNOSIS — E785 Hyperlipidemia, unspecified: Secondary | ICD-10-CM | POA: Insufficient documentation

## 2014-03-28 ENCOUNTER — Telehealth: Payer: Self-pay | Admitting: Family Medicine

## 2014-03-28 NOTE — Telephone Encounter (Signed)
Caller name: [name], Hazelyn A Relation to ZO:XWRUpt:self  Call back number: [phone]   Reason for call: pt would like to discuss lab results and would like to discuss her glucose levels from today reading.

## 2014-03-28 NOTE — Telephone Encounter (Signed)
Labs reviewed and the patient is +Diabetes. She needs an OV. Apt scheduled.     KP

## 2014-03-31 ENCOUNTER — Telehealth: Payer: Self-pay | Admitting: Family Medicine

## 2014-03-31 MED ORDER — METFORMIN HCL ER 500 MG PO TB24: 500.0000 mg | ORAL_TABLET | Freq: Every day | ORAL | Status: DC

## 2014-03-31 NOTE — Telephone Encounter (Signed)
Appointment scheduled with Dr. Laury AxonLowne on 04/01/14 @ 6:16 pm.

## 2014-03-31 NOTE — Telephone Encounter (Signed)
Patient was told metformin would be called in Friday  Was not at the pharmacy  Please call the patient

## 2014-03-31 NOTE — Telephone Encounter (Signed)
Spoke with patient advised Rx faxed and the patient will hold off on the Zocor until she sees Dr.Lowne      KP

## 2014-03-31 NOTE — Telephone Encounter (Signed)
[facility]  Triage Call Report Triage Record Num: [id number] Operator: Dominique [name] Patient Name: Dominique Eveneratricia [name] Call Date & Time: 03/29/2014 5:24:42PM Patient Phone: [phone] PCP: Dominique PerlaYvonne R. Lowne Patient Gender: Female PCP Fax : [phone] Patient DOB: 04/16/1961 Practice Name: [facility] [place] [facility]  Reason for Call: Caller: Dominique [name]/Patient; PCP: Dominique PerlaLowne, Yvonne R.; CB#: [phone]; Call regarding Diabetes; Was Dominique [name] states she feels tired. No energy. Hungry at times. States Blood sugar has ranged from 365 to 375 on 11/13 and 11/14. States she" has never been diagnosed with Diabetes Mellitus except gestational diabetes". Has documented glucose of of 211 on 04/03/13. States she was called by office on 03/28/14 and was told her HGB A1C is 11.9. Unable to verify lab in EPIC.Do not see lab results in EPIC for any labs done 03/24/14. This nurse spoke with Dominique [name] , [facility] [phone] who verified that Hgb A1C done on 03/24/14 was 11.9 and Glucose was 313. Dominique [name] states she was notified by office on 03/28/14 that Hgb A1C was 11.9 on 03/24/14 and that a prescription for Met[facility] was going [facility]. States prescription has not been called in. Pharmacy is currently closed. Per EPIC patient was called by Dominique [name], CMA at 03/28/2014 at 4:58 PM. Documentation states " Labs reviewed and the patient is +Diabetes". "She needs an OV". "Apt scheduled." Appt is scheduled for 04/01/14 at 18:15. Dr. Para Marchuncan notified and stated Metformin will not be ordered as there are other considerations neededsuch as renal function. Advised to follow protocol. Has extreme thirst and frequent urination. Per diabetes control problems has ED disposition due to signs and symptoms of ketoacidodis and blood sugar more than 300. Dominique [name] to be seen in ED. States she has had these symptoms x 4 to 6 weeks and she "is not going to go to the  ED." States she will drink water and try to get glucose down. Is concerned that Metformin was not called in to Pharmacy. Nurse repeated that Dr. Para Marchuncan was notified about her request for Metformin. Advised to be seen in ED and F/U with office on 03/31/14. Care advice given.  Protocol(s) Used: Diabetes: Control Problems Recommended Outcome per Protocol: See ED Immediately Reason for Outcome: Signs and symptoms of ketoacidosis AND blood sugar more than 300 mg/dl Care Advice: ~ Another adult should drive. ~ If available, bring recent log of blood sugars or bring blood glucose monitor with log of blood sugars. ~Dehydration can affect blood sugar levels. Drink water during transport and while waiting to see a provider. If vomiting, take sips of water or suck on ice chips. ~ IMMEDIATE ACTION ~Write down provider's name. List or place the following in a bag for transport with the patient: current prescription and/or nonprescription medications; alternative treatments, therapies and medications; and street drugs. ~ Call EMS 911 if any loss of consciousness, difficult to awaken, slow to respond, or new onset of confused thinking.

## 2014-04-01 ENCOUNTER — Ambulatory Visit (INDEPENDENT_AMBULATORY_CARE_PROVIDER_SITE_OTHER): Payer: 59 | Admitting: Family Medicine

## 2014-04-01 ENCOUNTER — Encounter: Payer: Self-pay | Admitting: Family Medicine

## 2014-04-01 VITALS — BP 132/62 | HR 85 | Temp 98.0°F | Wt 159.2 lb

## 2014-04-01 DIAGNOSIS — E1165 Type 2 diabetes mellitus with hyperglycemia: Secondary | ICD-10-CM

## 2014-04-01 DIAGNOSIS — IMO0002 Reserved for concepts with insufficient information to code with codable children: Secondary | ICD-10-CM

## 2014-04-01 MED ORDER — ONETOUCH DELICA LANCETS 33G MISC
Status: DC
Start: 2014-04-01 — End: 2014-04-04

## 2014-04-01 MED ORDER — METFORMIN HCL ER 500 MG PO TB24
ORAL_TABLET | ORAL | Status: DC
Start: 2014-04-01 — End: 2014-04-08

## 2014-04-01 MED ORDER — GLUCOSE BLOOD VI STRP: ORAL_STRIP | Status: DC

## 2014-04-01 NOTE — Progress Notes (Signed)
Pre visit review using our clinic review tool, if applicable. No additional management support is needed unless otherwise documented below in the visit note. 

## 2014-04-01 NOTE — Patient Instructions (Signed)
Diabetes and Standards of Medical Care Diabetes is complicated. You may find that your diabetes team includes a dietitian, nurse, diabetes educator, eye doctor, and more. To help everyone know what is going on and to help you get the care you deserve, the following schedule of care was developed to help keep you on track. Below are the tests, exams, vaccines, medicines, education, and plans you will need. HbA1c test This test shows how well you have controlled your glucose over the past 2-3 months. It is used to see if your diabetes management plan needs to be adjusted.   It is performed at least 2 times a year if you are meeting treatment goals.  It is performed 4 times a year if therapy has changed or if you are not meeting treatment goals. Blood pressure test  This test is performed at every routine medical visit. The goal is less than 140/90 mm Hg for most people, but 130/80 mm Hg in some cases. Ask your health care provider about your goal. Dental exam  Follow up with the dentist regularly. Eye exam  If you are diagnosed with type 1 diabetes as a child, get an exam upon reaching the age of 37 years or older and have had diabetes for 3-5 years. Yearly eye exams are recommended after that initial eye exam.  If you are diagnosed with type 1 diabetes as an adult, get an exam within 5 years of diagnosis and then yearly.  If you are diagnosed with type 2 diabetes, get an exam as soon as possible after the diagnosis and then yearly. Foot care exam  Visual foot exams are performed at every routine medical visit. The exams check for cuts, injuries, or other problems with the feet.  A comprehensive foot exam should be done yearly. This includes visual inspection as well as assessing foot pulses and testing for loss of sensation.  Check your feet nightly for cuts, injuries, or other problems with your feet. Tell your health care provider if anything is not healing. Kidney function test (urine  microalbumin)  This test is performed once a year.  Type 1 diabetes: The first test is performed 5 years after diagnosis.  Type 2 diabetes: The first test is performed at the time of diagnosis.  A serum creatinine and estimated glomerular filtration rate (eGFR) test is done once a year to assess the level of chronic kidney disease (CKD), if present. Lipid profile (cholesterol, HDL, LDL, triglycerides)  Performed every 5 years for most people.  The goal for LDL is less than 100 mg/dL. If you are at high risk, the goal is less than 70 mg/dL.  The goal for HDL is 40 mg/dL-50 mg/dL for men and 50 mg/dL-60 mg/dL for women. An HDL cholesterol of 60 mg/dL or higher gives some protection against heart disease.  The goal for triglycerides is less than 150 mg/dL. Influenza vaccine, pneumococcal vaccine, and hepatitis B vaccine  The influenza vaccine is recommended yearly.  It is recommended that people with diabetes who are over 24 years old get the pneumonia vaccine. In some cases, two separate shots may be given. Ask your health care provider if your pneumonia vaccination is up to date.  The hepatitis B vaccine is also recommended for adults with diabetes. Diabetes self-management education  Education is recommended at diagnosis and ongoing as needed. Treatment plan  Your treatment plan is reviewed at every medical visit. Document Released: 02/27/2009 Document Revised: 09/16/2013 Document Reviewed: 10/02/2012 [facility] Patient Information 2015 [place]. This information is not intended to replace advice given to you by your health care provider. Make sure you discuss any questions you have with your health care provider.  

## 2014-04-01 NOTE — Progress Notes (Signed)
  Subjective:     Willaim Shengatricia A Veley is a 53 y.o. female who presents with new onset of Type 2 diabetes.. Current symptoms include: polydipsia, polyuria, visual disturbances and weight loss. Patient denies foot ulcerations, increased appetite, nausea, paresthesia of the feet and vomiting. Evaluation to date has been: fasting blood sugar, fasting lipid panel and hemoglobin A1C. Home sugars: BGs are running  consistent with Hgb A1C. Current treatments: Started metformin which has been somewhat effective. Last dilated eye exam recent.  The following portions of the patient's history were reviewed and updated as appropriate: allergies, current medications, past family history, past medical history, past social history, past surgical history and problem list.  Review of Systems Pertinent items are noted in HPI.    Objective:    BP 132/62 mmHg  Pulse 85  Temp(Src) 98 F (36.7 C) (Oral)  Wt 159 lb 3.2 oz (72.213 kg)  SpO2 98% General appearance: alert, cooperative and no distress Throat: lips, mucosa, and tongue normal; teeth and gums normal Neck: no adenopathy, supple, symmetrical, trachea midline and thyroid not enlarged, symmetric, no tenderness/mass/nodules Lungs: clear to auscultation bilaterally Heart: S1, S2 normal Extremities: extremities normal, atraumatic, no cyanosis or edema   Sensory exam of the foot is normal, tested with the monofilament. Good pulses, no lesions or ulcers, good peripheral pulse  Patient was not evaluated for proper footwear and sizing.  Laboratory: No components found for: A1C    Assessment:    Diabetes mellitus Type II, under poor control.    Plan:    Discussed general issues about diabetes pathophysiology and management. Agricultural engineerducational material distributed. Addressed ADA diet. Suggested low cholesterol diet. Discussed foot care. Reminded to get yearly retinal exam. Started metformin; see  medication orders. Diabetes educator referral. Follow up in  3 months or as needed.   Pt given glucometer-- check tid

## 2014-04-04 ENCOUNTER — Telehealth: Payer: Self-pay | Admitting: Family Medicine

## 2014-04-04 MED ORDER — ONETOUCH DELICA LANCETS 33G MISC: Status: AC

## 2014-04-04 MED ORDER — GLUCOSE BLOOD VI STRP: ORAL_STRIP | Status: DC

## 2014-04-04 NOTE — Telephone Encounter (Signed)
Rx faxed.    KP 

## 2014-04-04 NOTE — Telephone Encounter (Signed)
Caller name: Weda Relation to pt:  self Call back number: [phone] Pharmacy:  Reason for call:   Patient states that the lancets and test strips should [facility]. [facility] but would also like a refill sent to mail order.

## 2014-04-08 ENCOUNTER — Other Ambulatory Visit: Payer: Self-pay | Admitting: Family Medicine

## 2014-04-08 ENCOUNTER — Telehealth: Payer: Self-pay | Admitting: Family Medicine

## 2014-04-08 DIAGNOSIS — IMO0002 Reserved for concepts with insufficient information to code with codable children: Secondary | ICD-10-CM

## 2014-04-08 DIAGNOSIS — E1165 Type 2 diabetes mellitus with hyperglycemia: Secondary | ICD-10-CM

## 2014-04-08 MED ORDER — SITAGLIP PHOS-METFORMIN HCL ER 100-1000 MG PO TB24: ORAL_TABLET | ORAL | Status: DC

## 2014-04-08 NOTE — Telephone Encounter (Signed)
Caller name:[name], Coretha Relation to ZO:XWRUpt:self Call back number:[phone] [facility] [address]  Reason for call: pt states pharmacy informed her that she needs a PA for her rx for diabetes, pt states dr. Laury Axonlowne was starting her on a new med for her diabetes, pt did not know the name but states she needs prior authorization for it. Pt states she need rx today if possible because she leaves to go out of town on tomorrow.

## 2014-04-08 NOTE — Telephone Encounter (Signed)
Caller name: [name], Laurelin A Relation to pt: self  Call back number: [phone] [facility]   Reason for call:   metFORMIN (GLUCOPHAGE XR) 500 MG 24 hr tablet causing glucose levels averaging 280.requesting rx to bring levels down. Please advise

## 2014-04-08 NOTE — Telephone Encounter (Signed)
D/c glucophage and change to janumet xr---- [facility]

## 2014-04-08 NOTE — Telephone Encounter (Signed)
Please advise      KP 

## 2014-04-08 NOTE — Telephone Encounter (Signed)
PA initiated. Awaiting determination. JG//CMA 

## 2014-04-08 NOTE — Telephone Encounter (Signed)
Patient has been made aware and voiced understanding and has agreed to stop the Metformin and start the Janumet.      KP

## 2014-04-09 NOTE — Telephone Encounter (Signed)
PA for Janumet XR approved for ten years through 04/08/2024. Case number: [id number]. Approval letter sent for scanning. JG//CMA

## 2014-04-16 ENCOUNTER — Telehealth: Payer: Self-pay | Admitting: Family Medicine

## 2014-04-16 DIAGNOSIS — E1165 Type 2 diabetes mellitus with hyperglycemia: Secondary | ICD-10-CM

## 2014-04-16 DIAGNOSIS — IMO0002 Reserved for concepts with insufficient information to code with codable children: Secondary | ICD-10-CM

## 2014-04-16 NOTE — Telephone Encounter (Signed)
Caller name: Relation to pt: Call back number: [phone] [facility] [address]  Reason for call:   Pt states that the pharmacy has the directions of janumet wrong. She states that Dr. Laury AxonLowne told her to take two daily and directions that pharmacy have has 1 daily. Patient needs corrected [facility] for a one month supply and also [facility]

## 2014-04-17 MED ORDER — SITAGLIP PHOS-METFORMIN HCL ER 100-1000 MG PO TB24: ORAL_TABLET | ORAL | Status: DC

## 2014-04-17 NOTE — Telephone Encounter (Signed)
Pt states she is almost positive that Dr. Laury AxonLowne instructed her and her husband that she should take the medication twice a day.  As a result she is in need of a refill.  She also said that her BS are currently ranging around 270.  BS have improved, but are still elevated.    Please advise.

## 2014-04-17 NOTE — Telephone Encounter (Signed)
Dr. Ernst SpellLowne's note was reviewed with patient.  Pt was advised that 2 [facility]ills were already prescribed [facility], but that [facility]. She was also made aware that insurance may not cover the meds requested.  [facility].  [facility] [address] to see if a request for a refill was possible.  The pharmacist shared that insurance says that they cannot dispense a refill until 04/29/14.  Pharmacist also said Janumet XR-30 pills will cost patient $380.00 out of pocket.   Pt was made aware that [facility] and the information given by the pharmacist was also shared. Pt stated understanding.  She said that she would count the number of pills that she has left and see if she has enough to last her until the 15th.

## 2014-04-17 NOTE — Telephone Encounter (Signed)
Med was changed from metformin to janumet xr which is extended release --- it is 1 x a day--- the metformin was 2x a day

## 2014-04-17 NOTE — Telephone Encounter (Signed)
Please review and advise     KP 

## 2014-04-17 NOTE — Telephone Encounter (Signed)
We discussed the metformin-- I had written than bid and she was only taking it once.  The new rx sent was extended release and is only 1 x a day

## 2014-04-21 ENCOUNTER — Telehealth: Payer: Self-pay | Admitting: *Deleted

## 2014-04-21 NOTE — Telephone Encounter (Signed)
PA for Janumet initiated. Awaiting determination. JG//CMA

## 2014-04-21 NOTE — Telephone Encounter (Signed)
PA approved. Reference #: [id number]

## 2014-04-24 ENCOUNTER — Telehealth: Payer: Self-pay

## 2014-04-24 ENCOUNTER — Encounter: Payer: Self-pay | Admitting: Medical

## 2014-04-24 ENCOUNTER — Ambulatory Visit (INDEPENDENT_AMBULATORY_CARE_PROVIDER_SITE_OTHER): Payer: 59 | Admitting: Medical

## 2014-04-24 VITALS — BP 136/84 | HR 83 | Temp 97.9°F | Wt 158.8 lb

## 2014-04-24 DIAGNOSIS — J209 Acute bronchitis, unspecified: Secondary | ICD-10-CM

## 2014-04-24 MED ORDER — BENZONATATE 100 MG PO CAPS
100.0000 mg | ORAL_CAPSULE | Freq: Three times a day (TID) | ORAL | Status: DC | PRN
Start: 2014-04-24 — End: 2014-07-10

## 2014-04-24 MED ORDER — CEFDINIR 300 MG PO CAPS: 300.0000 mg | ORAL_CAPSULE | Freq: Two times a day (BID) | ORAL | Status: DC

## 2014-04-24 MED ORDER — FLUCONAZOLE 150 MG PO TABS: 150.0000 mg | ORAL_TABLET | Freq: Once | ORAL | Status: DC

## 2014-04-24 MED ORDER — BENZONATATE 100 MG PO CAPS: 100.0000 mg | ORAL_CAPSULE | Freq: Three times a day (TID) | ORAL | Status: DC | PRN

## 2014-04-24 NOTE — Patient Instructions (Addendum)
By exam you appear to have possible bronchitis  and early lt om. In light of your daughter upcoming surgery and you being caregiver post op I will go ahead and rx cefdinir antibiotic and benzonatate for cough. I will rx diflucan for yeast infection if that occurs.  If you asthma flares use albuterol. Go ahead and use qvar daily. If having to use albuterol every 4-6 hours notify us.  Follow up in 7 days or as needed.

## 2014-04-24 NOTE — Progress Notes (Signed)
Pre visit review using our clinic review tool, if applicable. No additional management support is needed unless otherwise documented below in the visit note. 

## 2014-04-24 NOTE — Assessment & Plan Note (Signed)
By exam you appear to have possible bronchitis  and early lt om. In light of your daughter upcoming surgery and you being caregiver post op I will go ahead and rx cefdinir antibiotic and benzonatate for cough. I will rx diflucan for yeast infection if that occurs.  If you asthma flares use albuterol. Go ahead and use qvar daily. If having to use albuterol every 4-6 hours notify us.

## 2014-04-24 NOTE — Progress Notes (Signed)
Subjective:    Patient ID: Dominique ShengPatricia A [name], female    DOB: 01/29/1961, 53 y.o.   MRN: [id number]  HPI   Pt in today reporting  cough, nasal congestion and runny nose for  3  Days. Pt has dry cough. Mild sinus pressure maxillary and frontal. Clear drainage. No fever or chills. Pt concerned since her daughter is set to have gallbladder surgery and she will be main caregiver post op period  Associated symptoms( below yes or no)  Fever-no Chills-no Chest congestion-no Sneezing- no Itching eyes-no Sore throat- Mild scratchy. Post-nasal drainage-yes. Wheezing-NO. But history of asthma.Pt has qvar and albuterol. Purulent drainage-no. Fatigue- yes mild.  Past Medical History  Diagnosis Date  . Hypertension   . Endometriosis   . Histoplasmosis 2002    history of, [place]  . Reactive airway disease   . Allergy     rhinitis  . Kidney stone   . GERD (gastroesophageal reflux disease)   . Asthma   . Pneumonia   . H/O hiatal hernia   . Arthritis     left foot    History   Social History  . Marital Status: Married    Spouse Name: N/A    Number of Children: N/A  . Years of Education: N/A   Occupational History  .      unemployed   Social History Main Topics  . Smoking status: Never Smoker   . Smokeless tobacco: Never Used  . Alcohol Use: No  . Drug Use: No  . Sexual Activity:    Partners: Male   Other Topics Concern  . Not on file   Social History Narrative   Exercise-- no    Past Surgical History  Procedure Laterality Date  . Tonsillectomy and adenoidectomy  1967  . Uterine lining ablation  2002  . Laparatomy  1985  . Radial head arthroplasty Left 02/23/2013    Procedure: LEFT RADIAL HEAD ARTHROPLASTY qwith ligament reconstruction;  Surgeon: Dominique SeverinWilliam Gramig, MD;  Location: [facility] OR;  Service: Orthopedics;  Laterality: Left;    Family History  Problem Relation Age of Onset  . Diabetes Mother   . Hyperlipidemia Mother   . Hypertension Mother   .  Diabetes Father   . Hyperlipidemia Father   . Hypertension Father     No Known Allergies  Current Outpatient Prescriptions on File Prior to Visit  Medication Sig Dispense Refill  . albuterol (VENTOLIN HFA) 108 (90 BASE) MCG/ACT inhaler Inhale 2 puffs into the lungs every 6 (six) hours as needed for wheezing or shortness of breath. For shortness of breath 3 Inhaler 1  . Ascorbic Acid (VITAMIN C) 1000 MG tablet Take 1,000 mg by mouth daily.    . beclomethasone (QVAR) 40 MCG/ACT inhaler Inhale 2 puffs into the lungs 2 (two) times daily. 3 Inhaler 3  . calcium carbonate (OS-CAL - DOSED IN MG OF ELEMENTAL CALCIUM) 1250 MG tablet Take 1 tablet by mouth.    . cholecalciferol (VITAMIN D) 1000 UNITS tablet Take 1,000 Units by mouth daily.    [name]. dexlansoprazole (DEXILANT) 60 MG capsule Take 1 capsule by mouth  daily 90 capsule 3  . fluticasone (VERAMYST) 27.5 MCG/SPRAY nasal spray Place 2 sprays into the nose daily. Pt failed fluticasone 30 g 3  . glucose blood test strip ONETOUCH VERIO-CHECK BLOOD SUGAR THREE TIMES A DAY 200 each 12  . levalbuterol (XOPENEX) 1.25 MG/3ML nebulizer solution Take 1.25 mg by nebulization every 6 (six) hours as needed for wheezing  or shortness of breath. 3 ml nebs 72 mL 1  . losartan (COZAAR) 100 MG tablet 1 tab by mouth daily 30 tablet 0  . montelukast (SINGULAIR) 10 MG tablet Take 1 tablet by mouth at  bedtime 90 tablet 3  . NONFORMULARY OR COMPOUNDED ITEM Cbcd, bmp, hep, lipid, tsh, ua, hgba1c----V70.0--complete physical, htn, hx gestational dm 1 each 0  . ONETOUCH DELICA LANCETS 33G MISC Check Blood sugar three times per day 200 each 12  . SitaGLIPtin-MetFORMIN HCl (JANUMET XR) [phone] MG TB24 1 po qd 30 tablet 3   No current facility-administered medications on file prior to visit.    BP 136/84 mmHg  Pulse 83  Temp(Src) 97.9 F (36.6 C) (Oral)  Wt 158 lb 12.8 oz (72.031 kg)  SpO2 97%        Review of Systems  Constitutional: Positive for fatigue.  Negative for fever and chills.  HENT: Positive for congestion, postnasal drip, rhinorrhea, sinus pressure and sore throat.   Eyes: Positive for itching.  Respiratory: Positive for cough. Negative for wheezing.   Cardiovascular: Negative for chest pain and palpitations.  Musculoskeletal: Negative for back pain and neck pain.  Neurological: Negative for dizziness and headaches.  Hematological: Negative for adenopathy. Does not bruise/bleed easily.       Objective:   Physical Exam   General  Mental Status - Alert. General Appearance - Well groomed. Not in acute distress.  Skin Rashes- No Rashes.  HEENT Head- Normal. Ear Auditory Canal - Left- Normal. Right - Normal.Tympanic Membrane- Left- upper middle central bright redness. Right- Normal. Eye Sclera/Conjunctiva- Left- Normal. Right- Normal. Nose & Sinuses Nasal Mucosa- Left-   boggy + Congested. Right-   boggy + Congested. Mouth & Throat Lips: Upper Lip- Normal: no dryness, cracking, pallor, cyanosis, or vesicular eruption. Lower Lip-Normal: no dryness, cracking, pallor, cyanosis or vesicular eruption. Buccal Mucosa- Bilateral- No Aphthous ulcers. Oropharynx- No Discharge or Erythema. Tonsils: Characteristics- Bilateral- No Erythema or Congestion. Size/Enlargement- Bilateral- No enlargement. Discharge- bilateral-None.  Neck Neck- Supple. No Masses.   Chest and Lung Exam Auscultation: Breath Sounds:- even and unlabored, but bilateral upper lobe rhonchi.  Cardiovascular Auscultation:Rythm- Regular, rate and rhythm. Murmurs & Other Heart Sounds:Ausculatation of the heart reveal- No Murmurs.  Lymphatic Head & Neck General Head & Neck Lymphatics: Bilateral: Description- No Localized lymphadenopathy.         Assessment & Plan:

## 2014-04-24 NOTE — Telephone Encounter (Signed)
Medications (Cefdinir, Benzonatate, Fluconazole) from today's visit were sent to [facility].  [facility] [address].  [facility]  [facility] called and prescriptions cancelled.  Called patient back and left a message on phone making her aware that [facility] request.  She was encouraged to call back with addtional questions or concerns.

## 2014-05-23 IMAGING — CT CT ELBOW*L* W/O CM
2 of 5 series · 14 of 29 positions shown, 17 images · non-contrast
Comparison: Radiographs dated 01/31/2013

CLINICAL DATA: Radial head fracture.

EXAM:
CT OF THE LEFT ELBOW WITHOUT CONTRAST
TECHNIQUE: Multidetector CT imaging was performed according to the standard
protocol. Multiplanar CT image reconstructions were also generated.

[Series 5: upper ext soft · axial · 0.39mm/px · z∈[-75,+95]mm · 12 of 82 slices shown, 15 images]
[im 7/82  soft-tissue]
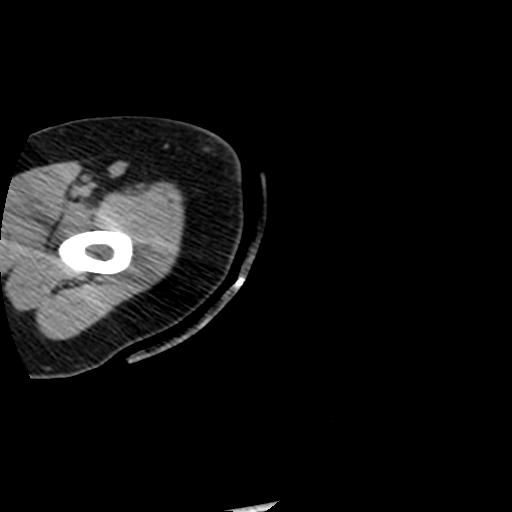
[im 7/82  bone]
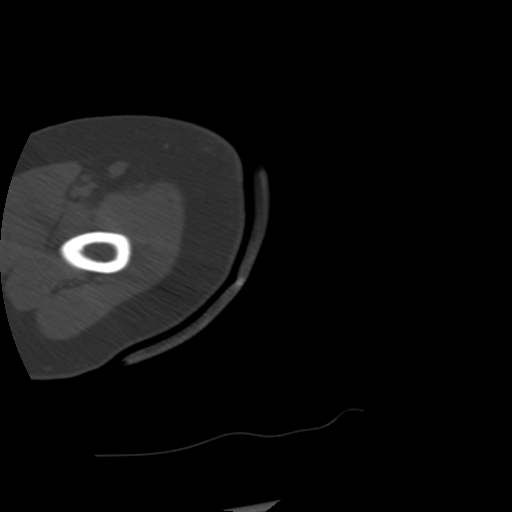
[im 13/82  bone]
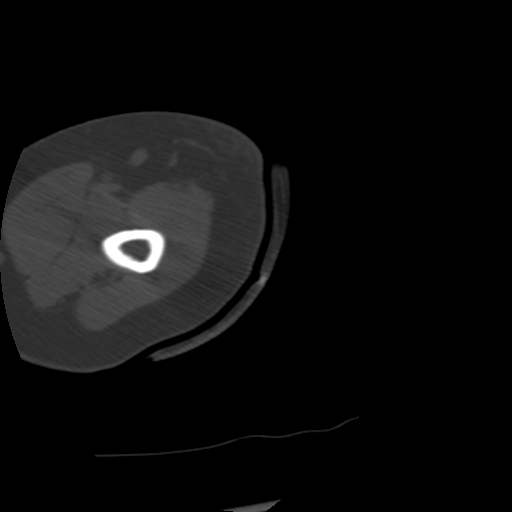
[im 19/82  bone]
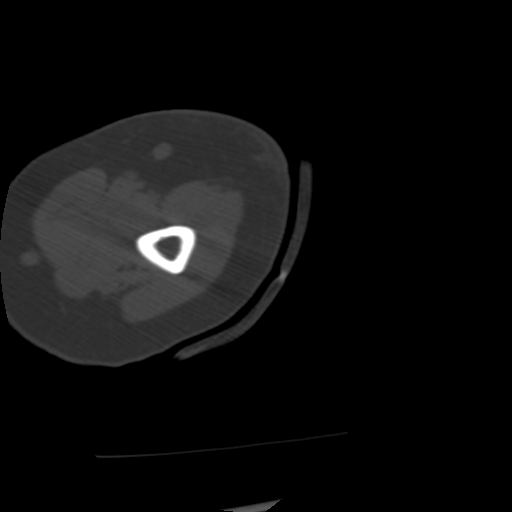
[im 25/82  bone]
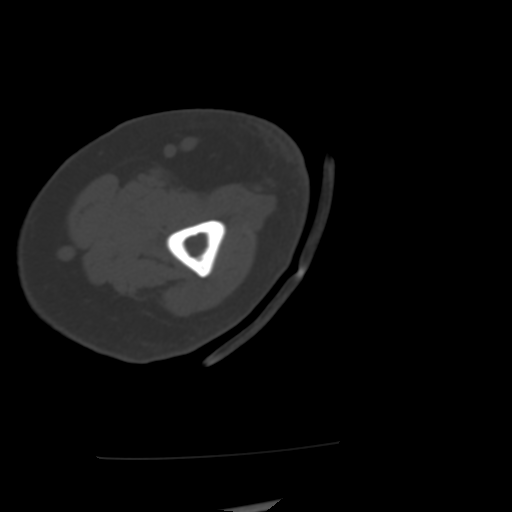
[im 32/82  soft-tissue]
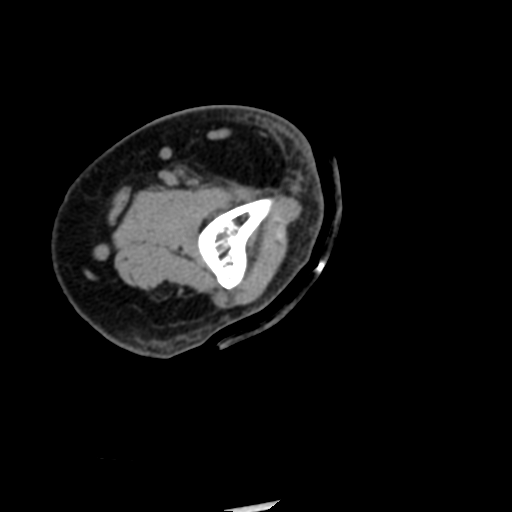
[im 32/82  bone]
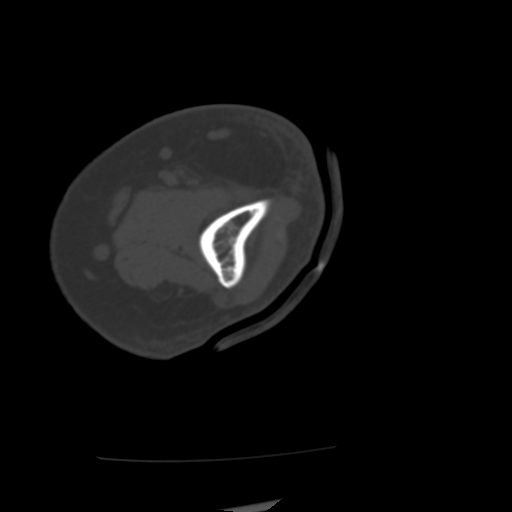
[im 38/82  bone]
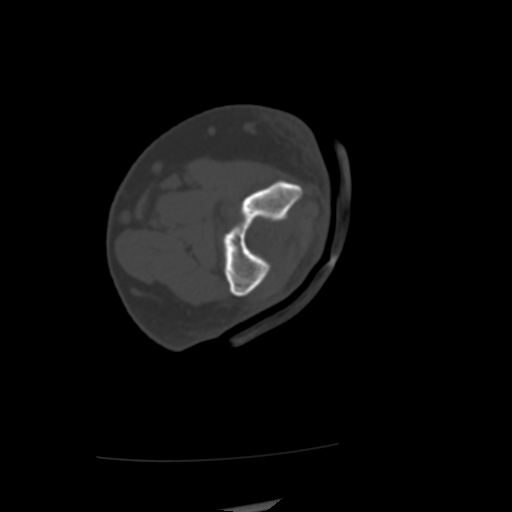
[im 44/82  bone]
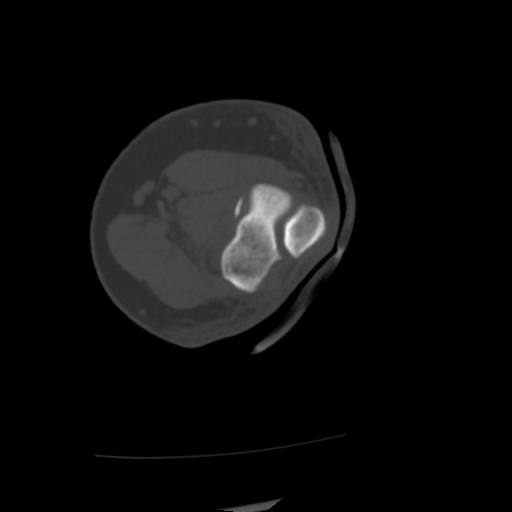
[im 50/82  bone]
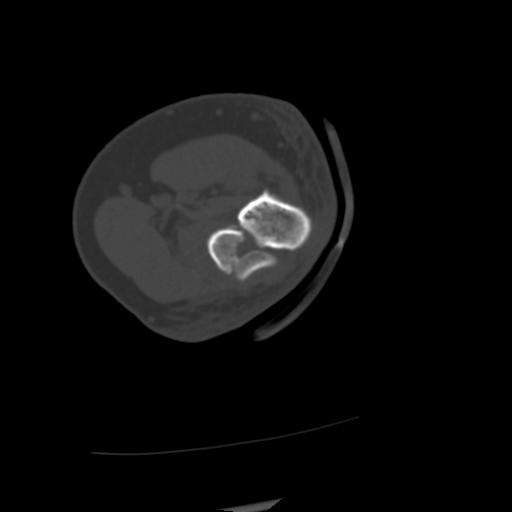
[im 57/82  soft-tissue]
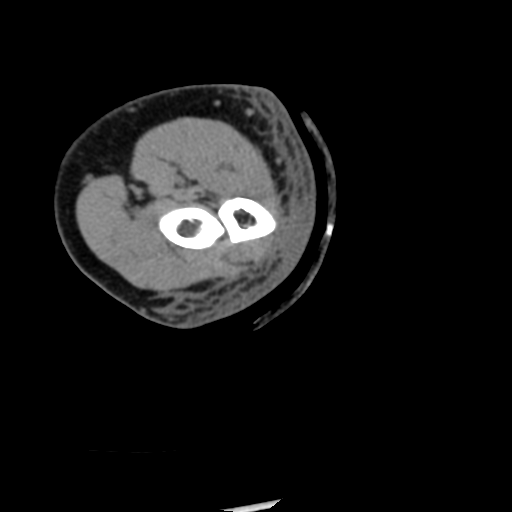
[im 57/82  bone]
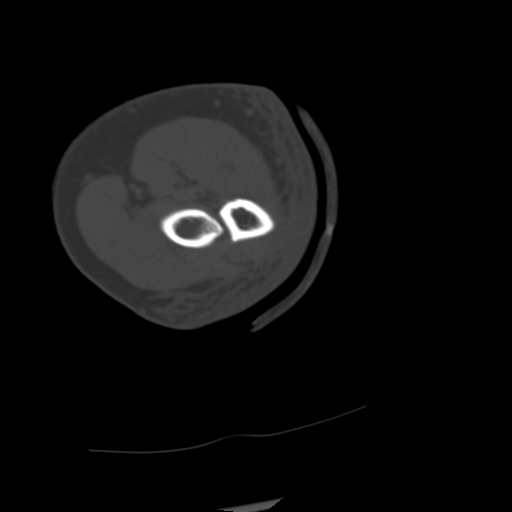
[im 63/82  bone]
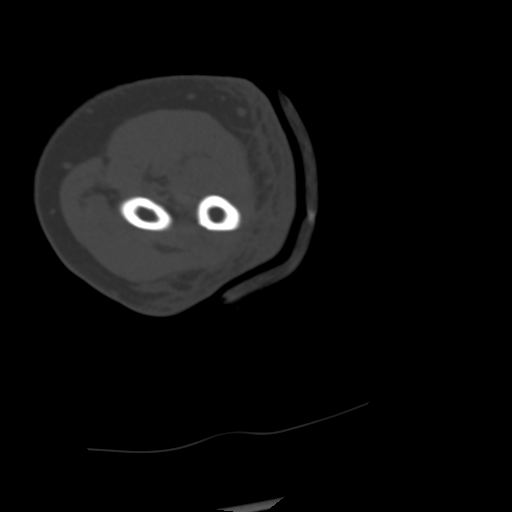
[im 69/82  bone]
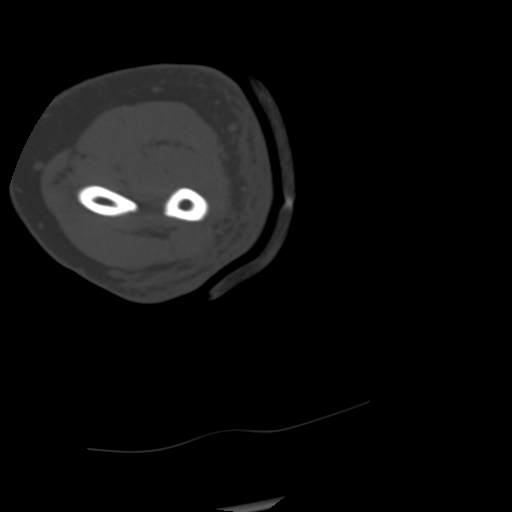
[im 75/82  bone]
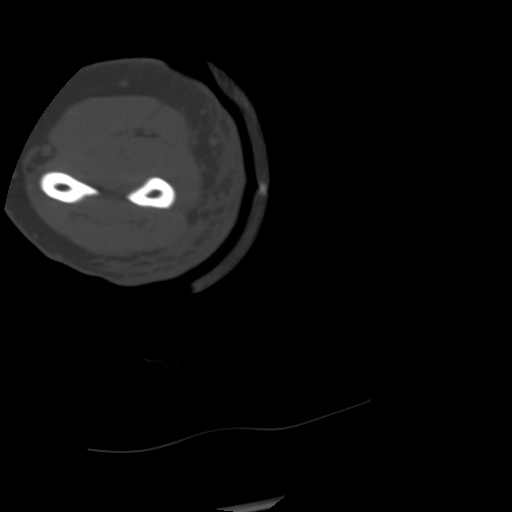

[Series 202: processed images · coronal · 0.41mm/px · 2 of 54 slices shown]
[im 21/54  soft-tissue]
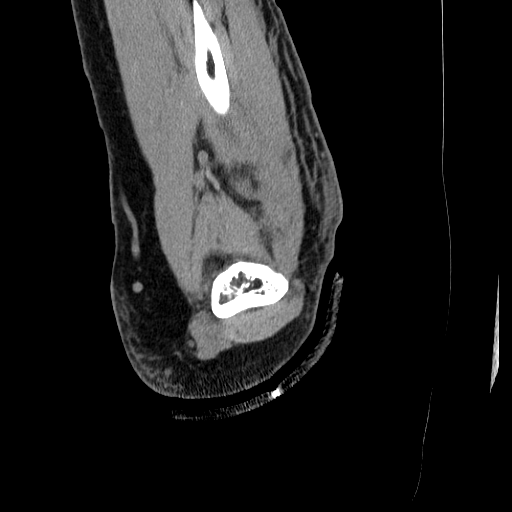
[im 27/54  bone]
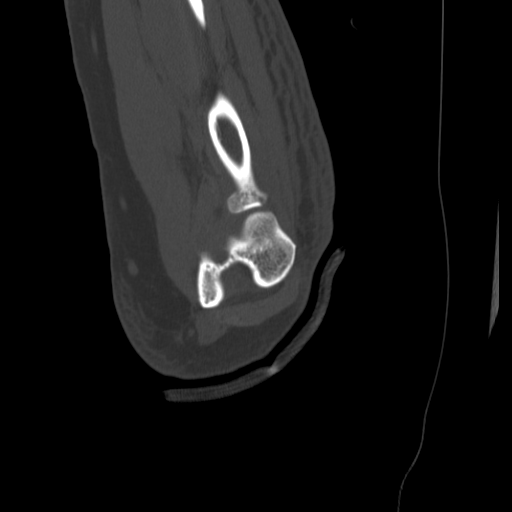

[14 of 29 positions shown; findings below may reference images not displayed]

FINDINGS: There is a comminuted fracture of the radial head. The radial head
is broken into 2 major fragments and approximately [DATE] of the radial
head is displaced and rotated laterally. The other fragment is
impacted.

The proximal ulna and distal humerus are intact.
IMPRESSION: Comminuted fracture of the radial head with displacement and
rotation of approximately 50% of the radial head as described.

## 2014-05-26 IMAGING — CR DG CHEST 2V
2 series · 2 of 2 positions shown · non-contrast
Comparison: 01/08/2008.  06/03/2010.

CLINICAL DATA: Preoperative evaluation. History of hypertension.
History of previous histoplasmosis and reactive airway disease and
hiatal hernia.

EXAM:
CHEST  2 VIEW

[w chest pa]
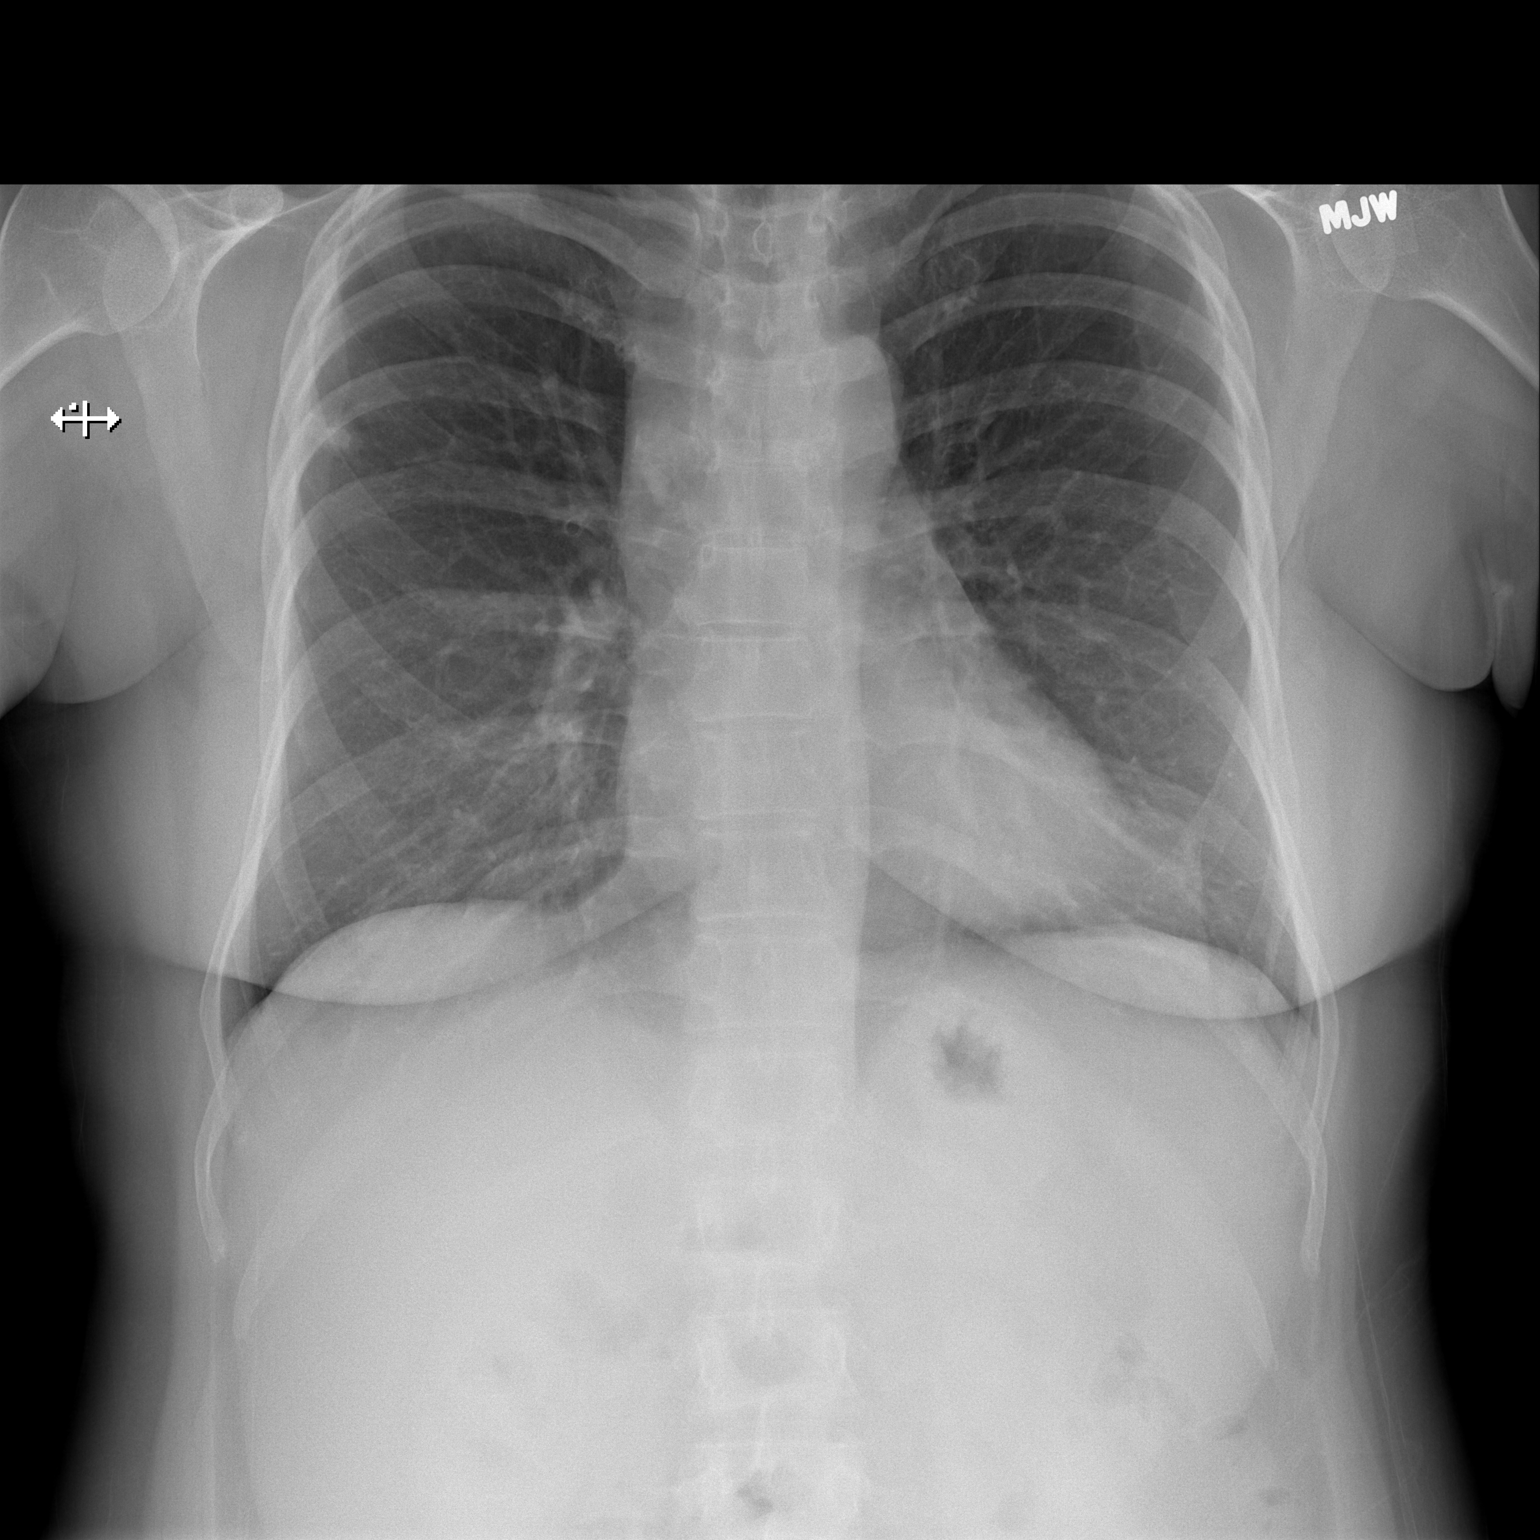

[w chest lat]
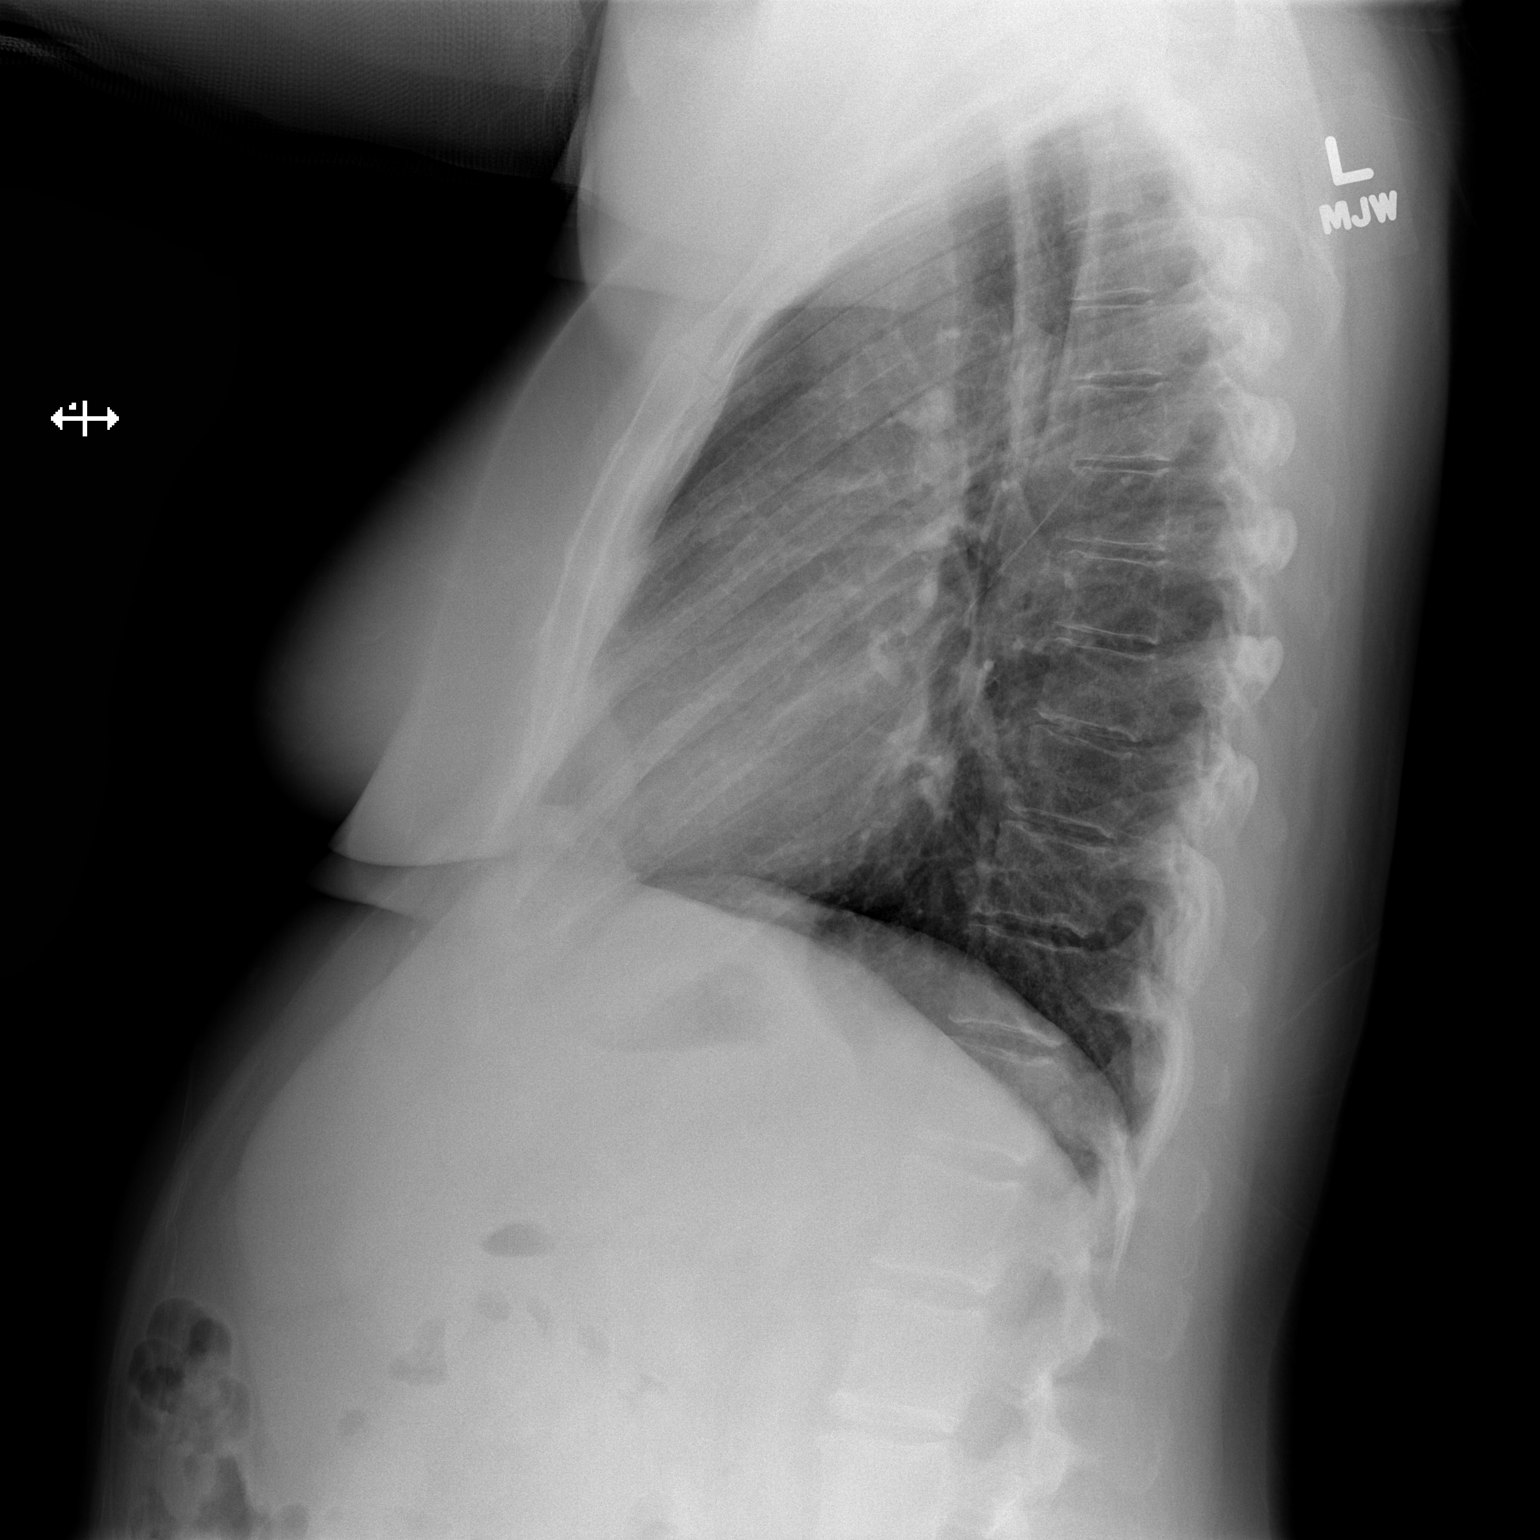

[2 of 2 positions shown; findings below may reference images not displayed]

FINDINGS: Cardiac silhouette is normal size and shape. This is stable. There
is stable slight ectasia of brachiocephalic vessels. Mediastinal and
hilar contours appear stable. No acute or active cardiopulmonary
infiltrates are seen. The right upper lobe quadrant nodular opacity
consistent with previous granulomatous disease is unchanged from
prior studies. History is given of previous histoplasmosis. No
pleural abnormality is evident. There is slightly osteopenic
appearance of bones.
IMPRESSION: No acute or active cardiopulmonary or pleural abnormalities are
seen. Stable old right upper lobe nodular opacity consistent with
previous granulomatous infection. Slightly osteopenic appearance of
bones.

## 2014-06-05 ENCOUNTER — Other Ambulatory Visit: Payer: Self-pay | Admitting: Family Medicine

## 2014-06-25 ENCOUNTER — Telehealth: Payer: Self-pay | Admitting: Family Medicine

## 2014-06-25 DIAGNOSIS — E785 Hyperlipidemia, unspecified: Secondary | ICD-10-CM

## 2014-06-25 DIAGNOSIS — E1169 Type 2 diabetes mellitus with other specified complication: Secondary | ICD-10-CM

## 2014-06-25 DIAGNOSIS — E039 Hypothyroidism, unspecified: Secondary | ICD-10-CM

## 2014-06-25 NOTE — Telephone Encounter (Signed)
Pt has a 3 month follow up appointment on 07/03/14 @ 9:15 am with Dr. Laury AxonLowne.  She would like for whatever labs Dr. Laury AxonLowne plan to order that day to be given as a written order to take to [facility] so that she may have labs drawn there for free.  Pt was advise that typically labs are ordered during visit.  However, patient would like to have labs drawn beforehand so that Dr. Laury AxonLowne can review labs before visit.    Please advise.

## 2014-06-25 NOTE — Telephone Encounter (Signed)
Caller name: [name], [name] Relation to pt: self  Call back number: [phone]   Reason for call:  Pt requesting RX for lab orders, pt requesting to do labs at [facility] because its free.  Please advise

## 2014-06-25 NOTE — Telephone Encounter (Signed)
Also TSH--- hypothyroid

## 2014-06-25 NOTE — Telephone Encounter (Signed)
Hep, lipid, bmd, hgba1c----dm II, lipid he

## 2014-06-26 NOTE — Telephone Encounter (Signed)
Patient aware orders are ready for pick up.      KP

## 2014-06-30 ENCOUNTER — Other Ambulatory Visit: Payer: Self-pay | Admitting: Family Medicine

## 2014-06-30 DIAGNOSIS — Z8632 Personal history of gestational diabetes: Secondary | ICD-10-CM

## 2014-06-30 DIAGNOSIS — Z0000 Encounter for general adult medical examination without abnormal findings: Secondary | ICD-10-CM

## 2014-06-30 DIAGNOSIS — I10 Essential (primary) hypertension: Secondary | ICD-10-CM

## 2014-07-03 ENCOUNTER — Ambulatory Visit: Payer: 59 | Admitting: Family Medicine

## 2014-07-10 ENCOUNTER — Encounter: Payer: Self-pay | Admitting: Family Medicine

## 2014-07-10 ENCOUNTER — Ambulatory Visit (INDEPENDENT_AMBULATORY_CARE_PROVIDER_SITE_OTHER): Payer: 59 | Admitting: Family Medicine

## 2014-07-10 VITALS — BP 127/77 | HR 99 | Temp 98.2°F | Wt 162.4 lb

## 2014-07-10 DIAGNOSIS — I10 Essential (primary) hypertension: Secondary | ICD-10-CM

## 2014-07-10 DIAGNOSIS — IMO0002 Reserved for concepts with insufficient information to code with codable children: Secondary | ICD-10-CM

## 2014-07-10 DIAGNOSIS — E1165 Type 2 diabetes mellitus with hyperglycemia: Secondary | ICD-10-CM

## 2014-07-10 DIAGNOSIS — E785 Hyperlipidemia, unspecified: Secondary | ICD-10-CM

## 2014-07-10 MED ORDER — NATEGLINIDE 60 MG PO TABS: 60.0000 mg | ORAL_TABLET | Freq: Two times a day (BID) | ORAL | Status: DC

## 2014-07-10 MED ORDER — NONFORMULARY OR COMPOUNDED ITEM: Status: DC

## 2014-07-10 MED ORDER — PRAVASTATIN SODIUM 20 MG PO TABS: 20.0000 mg | ORAL_TABLET | Freq: Every day | ORAL | Status: DC

## 2014-07-10 NOTE — Patient Instructions (Signed)
Diabetes and Standards of Medical Care Diabetes is complicated. You may find that your diabetes team includes a dietitian, nurse, diabetes educator, eye doctor, and more. To help everyone know what is going on and to help you get the care you deserve, the following schedule of care was developed to help keep you on track. Below are the tests, exams, vaccines, medicines, education, and plans you will need. HbA1c test This test shows how well you have controlled your glucose over the past 2-3 months. It is used to see if your diabetes management plan needs to be adjusted.   It is performed at least 2 times a year if you are meeting treatment goals.  It is performed 4 times a year if therapy has changed or if you are not meeting treatment goals. Blood pressure test  This test is performed at every routine medical visit. The goal is less than 140/90 mm Hg for most people, but 130/80 mm Hg in some cases. Ask your health care provider about your goal. Dental exam  Follow up with the dentist regularly. Eye exam  If you are diagnosed with type 1 diabetes as a child, get an exam upon reaching the age of 37 years or older and have had diabetes for 3-5 years. Yearly eye exams are recommended after that initial eye exam.  If you are diagnosed with type 1 diabetes as an adult, get an exam within 5 years of diagnosis and then yearly.  If you are diagnosed with type 2 diabetes, get an exam as soon as possible after the diagnosis and then yearly. Foot care exam  Visual foot exams are performed at every routine medical visit. The exams check for cuts, injuries, or other problems with the feet.  A comprehensive foot exam should be done yearly. This includes visual inspection as well as assessing foot pulses and testing for loss of sensation.  Check your feet nightly for cuts, injuries, or other problems with your feet. Tell your health care provider if anything is not healing. Kidney function test (urine  microalbumin)  This test is performed once a year.  Type 1 diabetes: The first test is performed 5 years after diagnosis.  Type 2 diabetes: The first test is performed at the time of diagnosis.  A serum creatinine and estimated glomerular filtration rate (eGFR) test is done once a year to assess the level of chronic kidney disease (CKD), if present. Lipid profile (cholesterol, HDL, LDL, triglycerides)  Performed every 5 years for most people.  The goal for LDL is less than 100 mg/dL. If you are at high risk, the goal is less than 70 mg/dL.  The goal for HDL is 40 mg/dL-50 mg/dL for men and 50 mg/dL-60 mg/dL for women. An HDL cholesterol of 60 mg/dL or higher gives some protection against heart disease.  The goal for triglycerides is less than 150 mg/dL. Influenza vaccine, pneumococcal vaccine, and hepatitis B vaccine  The influenza vaccine is recommended yearly.  It is recommended that people with diabetes who are over 24 years old get the pneumonia vaccine. In some cases, two separate shots may be given. Ask your health care provider if your pneumonia vaccination is up to date.  The hepatitis B vaccine is also recommended for adults with diabetes. Diabetes self-management education  Education is recommended at diagnosis and ongoing as needed. Treatment plan  Your treatment plan is reviewed at every medical visit. Document Released: 02/27/2009 Document Revised: 09/16/2013 Document Reviewed: 10/02/2012 [facility] Patient Information 2015 [place]. This information is not intended to replace advice given to you by your health care provider. Make sure you discuss any questions you have with your health care provider.  

## 2014-07-10 NOTE — Progress Notes (Signed)
Pre visit review using our clinic review tool, if applicable. No additional management support is needed unless otherwise documented below in the visit note. 

## 2014-07-10 NOTE — Progress Notes (Signed)
Subjective:    Patient ID: Dominique ShengPatricia A [name], female    DOB: 08/05/1960, 54 y.o.   MRN: [id number]  HPI  Patient here for f/u dm, cholesterol,  HYPERTENSION  Blood pressure range-not checking  Chest pain- no      Dyspnea- no Lightheadedness- no   Edema- no Other side effects - no   Medication compliance: good Low salt diet- yes   DIABETES  Blood Sugar ranges-113-130s  Polyuria- no New Visual problems- no Hypoglycemic symptoms- no Other side effects-no Medication compliance - good Last eye exam- dec-2015 Foot exam- today  HYPERLIPIDEMIA  Medication compliance- starting today RUQ pain- no  Muscle aches- no Other side effects-no   Past Medical History  Diagnosis Date  . Hypertension   . Endometriosis   . Histoplasmosis 2002    history of, [place]  . Reactive airway disease   . Allergy     rhinitis  . Kidney stone   . GERD (gastroesophageal reflux disease)   . Asthma   . Pneumonia   . H/O hiatal hernia   . Arthritis     left foot    Review of Systems  Constitutional: Negative for activity change, appetite change, fatigue and unexpected weight change.  Respiratory: Negative for cough and shortness of breath.   Cardiovascular: Negative for chest pain and palpitations.  Psychiatric/Behavioral: Negative for behavioral problems and dysphoric mood. The patient is not nervous/anxious.        Objective:    Physical Exam  Constitutional: She is oriented to person, place, and time. She appears well-developed and well-nourished.  HENT:  Head: Normocephalic and atraumatic.  Eyes: Conjunctivae and EOM are normal.  Neck: Normal range of motion. Neck supple. No JVD present. Carotid bruit is not present. No thyromegaly present.  Cardiovascular: Normal rate, regular rhythm and normal heart sounds.   No murmur heard. Pulmonary/Chest: Effort normal and breath sounds normal. No respiratory distress. She has no wheezes. She has no rales. She exhibits no  tenderness.  Musculoskeletal: She exhibits no edema.  Neurological: She is alert and oriented to person, place, and time.  Psychiatric: She has a normal mood and affect. Her behavior is normal. Judgment and thought content normal.  Sensory exam of the foot is normal, tested with the monofilament. Good pulses, no lesions or ulcers, good peripheral pulses.   BP 127/77 mmHg  Pulse 99  Temp(Src) 98.2 F (36.8 C) (Oral)  Wt 162 lb 6.4 oz (73.664 kg)  SpO2 97% Wt Readings from Last 3 Encounters:  07/10/14 162 lb 6.4 oz (73.664 kg)  04/24/14 158 lb 12.8 oz (72.031 kg)  04/01/14 159 lb 3.2 oz (72.213 kg)     Lab Results  Component Value Date   WBC 4.9 03/24/2014   HGB 16.5* 03/24/2014   HCT 48* 03/24/2014   PLT 166 03/24/2014   GLUCOSE 211* 04/03/2013   CHOL 192 03/24/2014   TRIG 192* 03/24/2014   HDL 35 03/24/2014   LDLCALC 119 03/24/2014   ALT 57* 03/24/2014   AST 36* 03/24/2014   NA 137 03/24/2014   K 4.3 03/24/2014   CL 100 04/03/2013   CREATININE 0.8 03/24/2014   BUN 14 03/24/2014   CO2 29 04/03/2013   TSH 1.56 03/24/2014   HGBA1C 11.9* 03/24/2014    No results found.     Assessment & Plan:   Problem List Items Addressed This Visit    Diabetes mellitus type II, uncontrolled   Relevant Medications   nateglinide (STARLIX) tablet  pravastatin (PRAVACHOL) tablet   NONFORMULARY OR COMPOUNDED ITEM    Other Visit Diagnoses    Hyperlipidemia    -  Primary    Relevant Medications    pravastatin (PRAVACHOL) tablet    NONFORMULARY OR COMPOUNDED ITEM    Essential hypertension        Relevant Medications    pravastatin (PRAVACHOL) tablet    NONFORMULARY OR COMPOUNDED ITEM        Loreen Freud, DO

## 2014-07-11 NOTE — Assessment & Plan Note (Signed)
Check labs 

## 2014-07-11 NOTE — Assessment & Plan Note (Signed)
Check labs con't meds 

## 2014-07-16 ENCOUNTER — Encounter: Payer: Self-pay | Admitting: Family Medicine

## 2014-07-18 ENCOUNTER — Telehealth: Payer: Self-pay | Admitting: Family Medicine

## 2014-07-18 NOTE — Telephone Encounter (Signed)
Caller name: Anija Relation to pt: self Call back number: [phone] Pharmacy:  Reason for call:   Patient has questions regarding starlix and pravastatin. She states that her sugar has been gradually increasing and wants to know if one of these meds can do that ?

## 2014-07-18 NOTE — Telephone Encounter (Signed)
Please advise      KP 

## 2014-07-18 NOTE — Telephone Encounter (Signed)
Pravastatin has it listed as a rare possiblity--- can stop that and see if sugars level out---- give it 10 -14 days but if con't to incr call us back

## 2014-07-18 NOTE — Telephone Encounter (Signed)
Patient has been made aware and verbalized understanding. She will call back if no improvement.     KP

## 2014-07-29 ENCOUNTER — Telehealth: Payer: Self-pay | Admitting: Family Medicine

## 2014-07-29 NOTE — Telephone Encounter (Signed)
Spoke with patient and she wanted to know if she could restart the Pravastatin since it was not causing her BS to be elevated.      KP

## 2014-07-29 NOTE — Telephone Encounter (Signed)
Patient has been made aware and has agreed to restart the medication.       KP

## 2014-07-29 NOTE — Telephone Encounter (Signed)
She can take starlix tid  Weight gain is not listed as a common side effect

## 2014-07-29 NOTE — Telephone Encounter (Signed)
Caller name: [name], Isley A Relation to pt: self  Call back number: [phone]   Reason for call:  Pt wanted to advise you her sugar level is ranging from 140 to 150 and pt is expercicing weight gain pt feels like its because of the new medication nateglinide (STARLIX) 60 MG tablet. Please advise

## 2014-07-29 NOTE — Telephone Encounter (Signed)
To MD to review and advise      KP 

## 2014-07-29 NOTE — Telephone Encounter (Signed)
Yes please

## 2014-07-30 ENCOUNTER — Telehealth: Payer: Self-pay | Admitting: Family Medicine

## 2014-07-30 NOTE — Telephone Encounter (Signed)
Caller name: Remona Relation to pt: self Call back number: [phone] [facility] [address]  Reason for call:   Patient states that Dr. Laury AxonLowne increased nateglinide [place] Payor(STARLIX) at last visit and now needs a new rx called in.   Patient states that she needs a 30 day supply of janumet sent to local pharmacy and a 90 day supply sent to [facility].

## 2014-07-31 MED ORDER — SITAGLIP PHOS-METFORMIN HCL ER 100-1000 MG PO TB24: ORAL_TABLET | ORAL | Status: DC

## 2014-07-31 MED ORDER — NATEGLINIDE 60 MG PO TABS: 60.0000 mg | ORAL_TABLET | Freq: Two times a day (BID) | ORAL | Status: DC

## 2014-07-31 NOTE — Telephone Encounter (Signed)
[facility] along with a 30 day supply of Janumet. And a 90 day supply of Janumet sent to [facility] per patient request.     KP

## 2014-08-18 ENCOUNTER — Other Ambulatory Visit: Payer: Self-pay

## 2014-08-18 DIAGNOSIS — E785 Hyperlipidemia, unspecified: Secondary | ICD-10-CM

## 2014-08-18 MED ORDER — NATEGLINIDE 60 MG PO TABS: 60.0000 mg | ORAL_TABLET | Freq: Two times a day (BID) | ORAL | Status: DC

## 2014-08-18 MED ORDER — PRAVASTATIN SODIUM 20 MG PO TABS: 20.0000 mg | ORAL_TABLET | Freq: Every day | ORAL | Status: DC

## 2014-09-22 ENCOUNTER — Ambulatory Visit (INDEPENDENT_AMBULATORY_CARE_PROVIDER_SITE_OTHER): Payer: 59 | Admitting: Family Medicine

## 2014-09-22 ENCOUNTER — Encounter: Payer: Self-pay | Admitting: Family Medicine

## 2014-09-22 VITALS — BP 100/70 | HR 92 | Temp 98.4°F | Wt 166.2 lb

## 2014-09-22 DIAGNOSIS — E1159 Type 2 diabetes mellitus with other circulatory complications: Secondary | ICD-10-CM

## 2014-09-22 DIAGNOSIS — J4520 Mild intermittent asthma, uncomplicated: Secondary | ICD-10-CM

## 2014-09-22 DIAGNOSIS — I10 Essential (primary) hypertension: Secondary | ICD-10-CM

## 2014-09-22 DIAGNOSIS — E1165 Type 2 diabetes mellitus with hyperglycemia: Secondary | ICD-10-CM

## 2014-09-22 DIAGNOSIS — J4521 Mild intermittent asthma with (acute) exacerbation: Secondary | ICD-10-CM

## 2014-09-22 DIAGNOSIS — E1151 Type 2 diabetes mellitus with diabetic peripheral angiopathy without gangrene: Secondary | ICD-10-CM

## 2014-09-22 DIAGNOSIS — E785 Hyperlipidemia, unspecified: Secondary | ICD-10-CM

## 2014-09-22 DIAGNOSIS — J45901 Unspecified asthma with (acute) exacerbation: Secondary | ICD-10-CM | POA: Insufficient documentation

## 2014-09-22 DIAGNOSIS — IMO0002 Reserved for concepts with insufficient information to code with codable children: Secondary | ICD-10-CM

## 2014-09-22 MED ORDER — NATEGLINIDE 60 MG PO TABS: 60.0000 mg | ORAL_TABLET | Freq: Three times a day (TID) | ORAL | Status: DC

## 2014-09-22 MED ORDER — PRAVASTATIN SODIUM 20 MG PO TABS: 20.0000 mg | ORAL_TABLET | Freq: Every day | ORAL | Status: DC

## 2014-09-22 MED ORDER — CANAGLIFLOZIN 100 MG PO TABS: 100.0000 mg | ORAL_TABLET | Freq: Every day | ORAL | Status: DC

## 2014-09-22 MED ORDER — MONTELUKAST SODIUM 10 MG PO TABS: ORAL_TABLET | ORAL | Status: DC

## 2014-09-22 MED ORDER — LOSARTAN POTASSIUM 100 MG PO TABS: ORAL_TABLET | ORAL | Status: DC

## 2014-09-22 NOTE — Progress Notes (Signed)
Patient ID: Dominique [name], female    DOB: 03/28/1961  Age: 54 y.o. MRN: [id number]    Subjective:  Subjective HPI Dominique [name] presents for f/u dm, cholesterol and htn. She is also c/o inc problems with her asthma.    HYPERTENSION  Blood pressure range-good  Chest pain- no      Dyspnea- yes secondary to asthma Lightheadedness- no   Edema- no Other side effects - no   Medication compliance: good Low salt diet- yes  DIABETES  Blood Sugar ranges-150-190  Polyuria- no New Visual problems- no Hypoglycemic symptoms- no Other side effects-no Medication compliance - good Last eye exam- 4-5 months ago Foot exam- today  HYPERLIPIDEMIA  Medication compliance- good RUQ pain- no  Muscle aches- no Other side effects-no  Review of Systems  Constitutional: Negative for diaphoresis, appetite change, fatigue and unexpected weight change.  Eyes: Negative for pain, redness and visual disturbance.  Respiratory: Negative for cough, chest tightness, shortness of breath and wheezing.   Cardiovascular: Negative for chest pain, palpitations and leg swelling.  Endocrine: Negative for cold intolerance, heat intolerance, polydipsia, polyphagia and polyuria.  Genitourinary: Negative for dysuria, frequency and difficulty urinating.  Neurological: Negative for dizziness, light-headedness, numbness and headaches.  Psychiatric/Behavioral: Negative for dysphoric mood and agitation. The patient is not nervous/anxious.     History Past Medical History  Diagnosis Date  . Hypertension   . Endometriosis   . Histoplasmosis 2002    history of, [place]  . Reactive airway disease   . Allergy     rhinitis  . Kidney stone   . GERD (gastroesophageal reflux disease)   . Asthma   . Pneumonia   . H/O hiatal hernia   . Arthritis     left foot    She has past surgical history that includes Tonsillectomy and adenoidectomy (1967); uterine lining ablation (2002); laparatomy (1985); and  Radial head arthroplasty (Left, 02/23/2013).   Her family history includes Diabetes in her father and mother; Hyperlipidemia in her father and mother; Hypertension in her father and mother.She reports that she has never smoked. She has never used smokeless tobacco. She reports that she does not drink alcohol or use illicit drugs.  Current Outpatient Prescriptions on File Prior to Visit  Medication Sig Dispense Refill  . albuterol (VENTOLIN HFA) 108 (90 BASE) MCG/ACT inhaler Inhale 2 puffs into the lungs every 6 (six) hours as needed for wheezing or shortness of breath. For shortness of breath 3 Inhaler 1  . Ascorbic Acid (VITAMIN C) 1000 MG tablet Take 1,000 mg by mouth daily.    . beclomethasone (QVAR) 40 MCG/ACT inhaler Inhale 2 puffs into the lungs 2 (two) times daily. 3 Inhaler 3  . calcium carbonate (OS-CAL - DOSED IN MG OF ELEMENTAL CALCIUM) 1250 MG tablet Take 1 tablet by mouth.    . cholecalciferol (VITAMIN D) 1000 UNITS tablet Take 1,000 Units by mouth daily.    [name]. dexlansoprazole (DEXILANT) 60 MG capsule Take 1 capsule by mouth  daily 90 capsule 3  . fluticasone (VERAMYST) 27.5 MCG/SPRAY nasal spray Place 2 sprays into the nose daily. Pt failed fluticasone 30 g 3  . glucose blood test strip ONETOUCH VERIO-CHECK BLOOD SUGAR THREE TIMES A DAY 200 each 12  . levalbuterol (XOPENEX) 1.25 MG/3ML nebulizer solution Take 1.25 mg by nebulization every 6 (six) hours as needed for wheezing or shortness of breath. 3 ml nebs 72 mL 1  . ONETOUCH DELICA LANCETS 33G MISC Check Blood sugar three times  per day 200 each 12  . SitaGLIPtin-MetFORMIN HCl (JANUMET XR) [phone] MG TB24 Take 1 tablet by mouth  every day 90 tablet 1   No current facility-administered medications on file prior to visit.     Objective:  Objective Physical Exam  Constitutional: She is oriented to person, place, and time. She appears well-developed and well-nourished.  HENT:  Head: Normocephalic and atraumatic.  Eyes:  Conjunctivae and EOM are normal.  Neck: Normal range of motion. Neck supple. No JVD present. Carotid bruit is not present. No thyromegaly present.  Cardiovascular: Normal rate, regular rhythm and normal heart sounds.   No murmur heard. Pulmonary/Chest: Effort normal and breath sounds normal. No respiratory distress. She has no wheezes. She has no rales. She exhibits no tenderness.  Musculoskeletal: She exhibits no edema.  Neurological: She is alert and oriented to person, place, and time.  Psychiatric: She has a normal mood and affect. Judgment normal.  Sensory exam of the foot is normal, tested with the monofilament. Good pulses, no lesions or ulcers, good peripheral pulses.  BP 100/70 mmHg  Pulse 92  Temp(Src) 98.4 F (36.9 C) (Oral)  Wt 166 lb 3.2 oz (75.388 kg)  SpO2 99% Wt Readings from Last 3 Encounters:  09/22/14 166 lb 3.2 oz (75.388 kg)  07/10/14 162 lb 6.4 oz (73.664 kg)  04/24/14 158 lb 12.8 oz (72.031 kg)     Lab Results  Component Value Date   WBC 4.9 03/24/2014   HGB 16.5* 03/24/2014   HCT 48* 03/24/2014   PLT 166 03/24/2014   GLUCOSE 211* 04/03/2013   CHOL 192 03/24/2014   TRIG 192* 03/24/2014   HDL 35 03/24/2014   LDLCALC 119 03/24/2014   ALT 57* 03/24/2014   AST 36* 03/24/2014   NA 137 03/24/2014   K 4.3 03/24/2014   CL 100 04/03/2013   CREATININE 0.8 03/24/2014   BUN 14 03/24/2014   CO2 29 04/03/2013   TSH 1.56 03/24/2014   HGBA1C 11.9* 03/24/2014    No results found.   Assessment & Plan:  Plan I have discontinued Dominique [name]'s NONFORMULARY OR COMPOUNDED ITEM and NONFORMULARY OR COMPOUNDED ITEM. I have also changed her nateglinide. Additionally, I am having her start on canagliflozin. Lastly, I am having her maintain her vitamin C, cholecalciferol, calcium carbonate, albuterol, beclomethasone, dexlansoprazole, fluticasone, levalbuterol, ONETOUCH DELICA LANCETS 33G, glucose blood, SitaGLIPtin-MetFORMIN HCl, pravastatin, montelukast, and  losartan.  Meds ordered this encounter  Medications  . pravastatin (PRAVACHOL) 20 MG tablet    Sig: Take 1 tablet (20 mg total) by mouth daily.    Dispense:  90 tablet    Refill:  1  . montelukast (SINGULAIR) 10 MG tablet    Sig: Take 1 tablet by mouth at  bedtime    Dispense:  90 tablet    Refill:  3  . losartan (COZAAR) 100 MG tablet    Sig: Take 1 tablet by mouth  daily    Dispense:  90 tablet    Refill:  3  . nateglinide (STARLIX) 60 MG tablet    Sig: Take 1 tablet (60 mg total) by mouth 3 (three) times daily with meals.    Dispense:  270 tablet    Refill:  1  . canagliflozin (INVOKANA) 100 MG TABS tablet    Sig: Take 1 tablet (100 mg total) by mouth daily.    Dispense:  30 tablet    Refill:  2    Problem List Items Addressed This Visit    Essential  hypertension    Stable con't cozaar      Relevant Medications   pravastatin (PRAVACHOL) 20 MG tablet   losartan (COZAAR) 100 MG tablet   Diabetes mellitus type II, uncontrolled    Pt thought her sugars were running high.  When labs came in from [facility] her a1c was actually 6.0 con't meds No reason to add invokanna           Relevant Medications   pravastatin (PRAVACHOL) 20 MG tablet   losartan (COZAAR) 100 MG tablet   nateglinide (STARLIX) 60 MG tablet   canagliflozin (INVOKANA) 100 MG TABS tablet   Asthma with exacerbation    con't in albuterol and qvar Make sure to use qvar bid       Relevant Medications   montelukast (SINGULAIR) 10 MG tablet    Other Visit Diagnoses    Hyperlipidemia    -  Primary    Relevant Medications    pravastatin (PRAVACHOL) 20 MG tablet    losartan (COZAAR) 100 MG tablet    Type 2 diabetes, uncontrolled, with peripheral circulatory disorder        Relevant Medications    pravastatin (PRAVACHOL) 20 MG tablet    losartan (COZAAR) 100 MG tablet    nateglinide (STARLIX) 60 MG tablet    canagliflozin (INVOKANA) 100 MG TABS tablet    Asthma, chronic, mild intermittent,  uncomplicated        Relevant Medications    montelukast (SINGULAIR) 10 MG tablet       Follow-up: Return in about 3 months (around 12/23/2014), or if symptoms worsen or fail to improve, for f/u and labs.  Loreen Freud, DO

## 2014-09-22 NOTE — Assessment & Plan Note (Signed)
Pt thought her sugars were running high.  When labs came in from [facility] her a1c was actually 6.0 con't meds No reason to add invokanna

## 2014-09-22 NOTE — Patient Instructions (Signed)

## 2014-09-22 NOTE — Assessment & Plan Note (Addendum)
Stable con't cozaar 

## 2014-09-22 NOTE — Progress Notes (Signed)
Pre visit review using our clinic review tool, if applicable. No additional management support is needed unless otherwise documented below in the visit note. 

## 2014-09-22 NOTE — Assessment & Plan Note (Addendum)
con't in albuterol and qvar Make sure to use qvar bid

## 2014-12-09 LAB — LIPID PANEL
CHOLESTEROL: 197 mg/dL (ref 0–200)
HDL: 43 mg/dL (ref 35–70)
LDL CALC: 120 mg/dL
LDL/HDL RATIO: 4.6
Triglycerides: 168 mg/dL — AB (ref 40–160)

## 2014-12-09 LAB — HEMOGLOBIN A1C: Hgb A1c MFr Bld: 6.4 % — AB (ref 4.0–6.0)

## 2014-12-09 LAB — BASIC METABOLIC PANEL
Creatinine: 0.9 mg/dL (ref 0.5–1.1)
Glucose: 119 mg/dL

## 2014-12-30 ENCOUNTER — Ambulatory Visit (INDEPENDENT_AMBULATORY_CARE_PROVIDER_SITE_OTHER): Payer: 59 | Admitting: Family Medicine

## 2014-12-30 ENCOUNTER — Encounter: Payer: Self-pay | Admitting: Family Medicine

## 2014-12-30 VITALS — BP 116/80 | HR 91 | Temp 98.9°F | Wt 164.0 lb

## 2014-12-30 DIAGNOSIS — E1159 Type 2 diabetes mellitus with other circulatory complications: Secondary | ICD-10-CM | POA: Diagnosis not present

## 2014-12-30 DIAGNOSIS — E785 Hyperlipidemia, unspecified: Secondary | ICD-10-CM

## 2014-12-30 DIAGNOSIS — I10 Essential (primary) hypertension: Secondary | ICD-10-CM | POA: Diagnosis not present

## 2014-12-30 DIAGNOSIS — K219 Gastro-esophageal reflux disease without esophagitis: Secondary | ICD-10-CM

## 2014-12-30 DIAGNOSIS — H02401 Unspecified ptosis of right eyelid: Secondary | ICD-10-CM

## 2014-12-30 DIAGNOSIS — E1165 Type 2 diabetes mellitus with hyperglycemia: Secondary | ICD-10-CM

## 2014-12-30 DIAGNOSIS — J4520 Mild intermittent asthma, uncomplicated: Secondary | ICD-10-CM

## 2014-12-30 DIAGNOSIS — J45909 Unspecified asthma, uncomplicated: Secondary | ICD-10-CM | POA: Diagnosis not present

## 2014-12-30 DIAGNOSIS — IMO0002 Reserved for concepts with insufficient information to code with codable children: Secondary | ICD-10-CM

## 2014-12-30 DIAGNOSIS — E1151 Type 2 diabetes mellitus with diabetic peripheral angiopathy without gangrene: Secondary | ICD-10-CM

## 2014-12-30 MED ORDER — DEXLANSOPRAZOLE 60 MG PO CPDR: DELAYED_RELEASE_CAPSULE | ORAL | Status: DC

## 2014-12-30 MED ORDER — FLUTICASONE FUROATE 27.5 MCG/SPRAY NA SUSP: 2.0000 | Freq: Every day | NASAL | Status: DC

## 2014-12-30 MED ORDER — NATEGLINIDE 60 MG PO TABS
60.0000 mg | ORAL_TABLET | Freq: Three times a day (TID) | ORAL | Status: DC
Start: 2014-12-30 — End: 2015-06-11

## 2014-12-30 MED ORDER — SITAGLIP PHOS-METFORMIN HCL ER 100-1000 MG PO TB24: ORAL_TABLET | ORAL | Status: DC

## 2014-12-30 MED ORDER — LOSARTAN POTASSIUM 100 MG PO TABS: ORAL_TABLET | ORAL | Status: DC

## 2014-12-30 MED ORDER — MONTELUKAST SODIUM 10 MG PO TABS: ORAL_TABLET | ORAL | Status: DC

## 2014-12-30 MED ORDER — NATEGLINIDE 60 MG PO TABS: 60.0000 mg | ORAL_TABLET | Freq: Three times a day (TID) | ORAL | Status: DC

## 2014-12-30 MED ORDER — PRAVASTATIN SODIUM 20 MG PO TABS: 20.0000 mg | ORAL_TABLET | Freq: Every day | ORAL | Status: DC

## 2014-12-30 NOTE — Progress Notes (Signed)
Pre visit review using our clinic review tool, if applicable. No additional management support is needed unless otherwise documented below in the visit note. 

## 2014-12-30 NOTE — Progress Notes (Signed)
Patient ID: Dominique [name], female    DOB: Dec 07, 1960  Age: 54 y.o. MRN: [id number]    Subjective:  Subjective HPI IYESHA SUCH presents for f/u dm, htn, hyperlipidemia.    HPI HYPERTENSION  Blood pressure range-not checking  Chest pain- no      Dyspnea- no Lightheadedness- no   Edema- no Other side effects - no   Medication compliance: goood Low salt diet- yes  DIABETES  Blood Sugar ranges- 150-160s -- 1x 100 Polyuria- no New Visual problems- no Hypoglycemic symptoms- yes when bs was 100-- chest pressure Other side effects-no Medication compliance - good Last eye exam- due Foot exam- today  HYPERLIPIDEMIA  Medication compliance- good RUQ pain- no  Muscle aches- no Other side effects-no     Review of Systems  Constitutional: Negative for diaphoresis, appetite change, fatigue and unexpected weight change.  Eyes: Negative for pain, redness and visual disturbance.  Respiratory: Negative for cough, chest tightness, shortness of breath and wheezing.   Cardiovascular: Negative for chest pain, palpitations and leg swelling.  Endocrine: Negative for cold intolerance, heat intolerance, polydipsia, polyphagia and polyuria.  Genitourinary: Negative for dysuria, frequency and difficulty urinating.  Neurological: Negative for dizziness, light-headedness, numbness and headaches.   . History Past Medical History  Diagnosis Date  . Hypertension   . Endometriosis   . 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 Reactive airway disease   . Allergy     rhinitis  . Kidney stone   . GERD (gastroesophageal reflux disease)   . Asthma   . Pneumonia   . H/O hiatal hernia   . Arthritis     left foot    She has past surgical history that includes Tonsillectomy and adenoidectomy (1967); uterine lining ablation (2002); laparatomy (1985); and Radial head arthroplasty (Left, 02/23/2013).   Her family history includes Diabetes in her father and mother;  Hyperlipidemia in her father and mother; Hypertension in her father and mother.She reports that she has never smoked. She has never used smokeless tobacco. She reports that she does not drink alcohol or use illicit drugs.  Current Outpatient Prescriptions on File Prior to Visit  Medication Sig Dispense Refill  . albuterol (VENTOLIN HFA) 108 (90 BASE) MCG/ACT inhaler Inhale 2 puffs into the lungs every 6 (six) hours as needed for wheezing or shortness of breath. For shortness of breath 3 Inhaler 1  . Ascorbic Acid (VITAMIN C) 1000 MG tablet Take 1,000 mg by mouth daily.    . beclomethasone (QVAR) 40 MCG/ACT inhaler Inhale 2 puffs into the lungs 2 (two) times daily. 3 Inhaler 3  . calcium carbonate (OS-CAL - DOSED IN MG OF ELEMENTAL CALCIUM) 1250 MG tablet Take 1 tablet by mouth.    . cholecalciferol (VITAMIN D) 1000 UNITS tablet Take 1,000 Units by mouth daily.    [name] glucose blood test strip ONETOUCH VERIO-CHECK BLOOD SUGAR THREE TIMES A DAY 200 each 12  . levalbuterol (XOPENEX) 1.25 MG/3ML nebulizer solution Take 1.25 mg by nebulization every 6 (six) hours as needed for wheezing or shortness of breath. 3 ml nebs 72 mL 1  . ONETOUCH DELICA LANCETS 33G MISC Check Blood sugar three times per day 200 each 12   No current facility-administered medications on file prior to visit.     Objective:  Objective Physical Exam  Constitutional: She is oriented to person, place, and time. She appears well-developed and well-nourished.  HENT:  Head: Normocephalic and atraumatic.  Eyes: Conjunctivae and EOM are  normal.  Neck: Normal range of motion. Neck supple. No JVD present. Carotid bruit is not present. No thyromegaly present.  Cardiovascular: Normal rate, regular rhythm and normal heart sounds.   No murmur heard. Pulmonary/Chest: Effort normal and breath sounds normal. No respiratory distress. She has no wheezes. She has no rales. She exhibits no tenderness.  Musculoskeletal: She exhibits no edema.    Neurological: She is alert and oriented to person, place, and time.  Psychiatric: She has a normal mood and affect. Her behavior is normal.   BP 116/80 mmHg  Pulse 91  Temp(Src) 98.9 F (37.2 C) (Oral)  Wt 164 lb (74.39 kg)  SpO2 98% Wt Readings from Last 3 Encounters:  12/30/14 164 lb (74.39 kg)  09/22/14 166 lb 3.2 oz (75.388 kg)  07/10/14 162 lb 6.4 oz (73.664 kg)     Lab Results  Component Value Date   WBC 4.9 03/24/2014   HGB 16.5* 03/24/2014   HCT 48* 03/24/2014   PLT 166 03/24/2014   GLUCOSE 211* 04/03/2013   CHOL 192 03/24/2014   TRIG 192* 03/24/2014   HDL 35 03/24/2014   LDLCALC 119 03/24/2014   ALT 57* 03/24/2014   AST 36* 03/24/2014   NA 137 03/24/2014   K 4.3 03/24/2014   CL 100 04/03/2013   CREATININE 0.8 03/24/2014   BUN 14 03/24/2014   CO2 29 04/03/2013   TSH 1.56 03/24/2014   HGBA1C 11.9* 03/24/2014    No results found.   Assessment & Plan:  Plan I have discontinued Ms. Laban's canagliflozin. I am also having her maintain her vitamin C, cholecalciferol, calcium carbonate, albuterol, beclomethasone, levalbuterol, ONETOUCH DELICA LANCETS 33G, glucose blood, dexlansoprazole, fluticasone, montelukast, losartan, SitaGLIPtin-MetFORMIN HCl, pravastatin, and nateglinide.  Meds ordered this encounter  Medications  . dexlansoprazole (DEXILANT) 60 MG capsule    Sig: Take 1 capsule by mouth  daily    Dispense:  90 capsule    Refill:  3  . fluticasone (VERAMYST) 27.5 MCG/SPRAY nasal spray    Sig: Place 2 sprays into the nose daily. Pt failed fluticasone    Dispense:  30 g    Refill:  3  . montelukast (SINGULAIR) 10 MG tablet    Sig: Take 1 tablet by mouth at  bedtime    Dispense:  90 tablet    Refill:  3  . losartan (COZAAR) 100 MG tablet    Sig: Take 1 tablet by mouth  daily    Dispense:  90 tablet    Refill:  3  . SitaGLIPtin-MetFORMIN HCl (JANUMET XR) [phone] MG TB24    Sig: Take 1 tablet by mouth  every day    Dispense:  90 tablet     Refill:  1  . DISCONTD: nateglinide (STARLIX) 60 MG tablet    Sig: Take 1 tablet (60 mg total) by mouth 3 (three) times daily with meals.    Dispense:  270 tablet    Refill:  1  . pravastatin (PRAVACHOL) 20 MG tablet    Sig: Take 1 tablet (20 mg total) by mouth daily.    Dispense:  90 tablet    Refill:  1  . nateglinide (STARLIX) 60 MG tablet    Sig: Take 1 tablet (60 mg total) by mouth 3 (three) times daily with meals.    Dispense:  270 tablet    Refill:  1    Please note --- sig changed to tid    Problem List Items Addressed This Visit    GERD  Relevant Medications   dexlansoprazole (DEXILANT) 60 MG capsule   Essential hypertension   Relevant Medications   losartan (COZAAR) 100 MG tablet   pravastatin (PRAVACHOL) 20 MG tablet    Other Visit Diagnoses    Asthma, chronic, unspecified asthma severity, uncomplicated    -  Primary    Relevant Medications    fluticasone (VERAMYST) 27.5 MCG/SPRAY nasal spray    montelukast (SINGULAIR) 10 MG tablet    Asthma, chronic, mild intermittent, uncomplicated        Relevant Medications    montelukast (SINGULAIR) 10 MG tablet    Type 2 diabetes, uncontrolled, with peripheral circulatory disorder        Relevant Medications    losartan (COZAAR) 100 MG tablet    SitaGLIPtin-MetFORMIN HCl (JANUMET XR) [phone] MG TB24    pravastatin (PRAVACHOL) 20 MG tablet    nateglinide (STARLIX) 60 MG tablet    Hyperlipidemia        Relevant Medications    losartan (COZAAR) 100 MG tablet    pravastatin (PRAVACHOL) 20 MG tablet       Follow-up: Return in about 6 months (around 07/02/2015), or if symptoms worsen or fail to improve.  Loreen Freud, DO

## 2014-12-30 NOTE — Patient Instructions (Signed)

## 2015-01-09 ENCOUNTER — Telehealth: Payer: Self-pay

## 2015-01-09 ENCOUNTER — Encounter: Payer: Self-pay | Admitting: Family Medicine

## 2015-01-09 DIAGNOSIS — E785 Hyperlipidemia, unspecified: Secondary | ICD-10-CM

## 2015-01-09 NOTE — Telephone Encounter (Signed)
Received results from 12/09/14 work labs. The patient's cholesterol was elevated and Dr.Lowne wants to increase the Pravachol to 40 mg daily. The patient said she is taking the 20 mg every 3 days because it causes severe leg cramps. She stated that she is willing to take something but something generic that will not cause as much pain. Please advise    KP

## 2015-01-12 NOTE — Telephone Encounter (Signed)
pravachol doesn't have a long enough half life to take every 3 days but Lipitor does Try 10 mg  Every other day #15 2 refills Check labs in 3 months

## 2015-01-14 MED ORDER — ATORVASTATIN CALCIUM 10 MG PO TABS: 10.0000 mg | ORAL_TABLET | ORAL | Status: DC

## 2015-01-14 NOTE — Addendum Note (Signed)
Addended by: Mervin Kung A on: 01/14/2015 04:04 PM   Modules accepted: Orders

## 2015-01-14 NOTE — Telephone Encounter (Signed)
Notified pt. She is agreeable to try atorvastatin. [facility] and handwritten Rx for 3 month supply for her to send to mail order. Pt requests that we mail her rx and lab order for [facility] in 3 months.  Rx sent. Additional Rx printed and lab orders printed and attached to Rx.

## 2015-02-13 ENCOUNTER — Other Ambulatory Visit: Payer: Self-pay

## 2015-02-26 ENCOUNTER — Other Ambulatory Visit: Payer: Self-pay | Admitting: Family Medicine

## 2015-02-27 ENCOUNTER — Telehealth: Payer: Self-pay | Admitting: Family Medicine

## 2015-02-27 ENCOUNTER — Telehealth: Payer: Self-pay

## 2015-02-27 ENCOUNTER — Encounter: Payer: Self-pay | Admitting: Internal Medicine

## 2015-02-27 ENCOUNTER — Ambulatory Visit (INDEPENDENT_AMBULATORY_CARE_PROVIDER_SITE_OTHER): Payer: 59 | Admitting: Internal Medicine

## 2015-02-27 ENCOUNTER — Other Ambulatory Visit: Payer: Self-pay

## 2015-02-27 VITALS — BP 118/66 | HR 95 | Temp 98.0°F | Ht 63.0 in | Wt 164.0 lb

## 2015-02-27 DIAGNOSIS — G510 Bell's palsy: Secondary | ICD-10-CM

## 2015-02-27 DIAGNOSIS — R209 Unspecified disturbances of skin sensation: Secondary | ICD-10-CM | POA: Diagnosis not present

## 2015-02-27 DIAGNOSIS — R202 Paresthesia of skin: Secondary | ICD-10-CM

## 2015-02-27 MED ORDER — ATORVASTATIN CALCIUM 10 MG PO TABS: 10.0000 mg | ORAL_TABLET | ORAL | Status: DC

## 2015-02-27 MED ORDER — GABAPENTIN 300 MG PO CAPS: 300.0000 mg | ORAL_CAPSULE | Freq: Two times a day (BID) | ORAL | Status: DC

## 2015-02-27 MED ORDER — VALACYCLOVIR HCL 1 G PO TABS: 1000.0000 mg | ORAL_TABLET | Freq: Three times a day (TID) | ORAL | Status: DC

## 2015-02-27 NOTE — Progress Notes (Signed)
Subjective:    Patient ID: Dominique [name], female    DOB: Mar 09, 1961, 54 y.o.   MRN: [id number]  DOS:  02/27/2015 Type of visit - description : Acute, here with her daughter Interval history: Patient has a history of Bell palsy, never recuperate mobility 100%. She is concerned because since   last night she is having some problems on the right side of her face: Muscle cramps at the bases of the mouth right side, right face feels more numb, slightly more weak than usual?. She also has on and off sharp right ear pains.    Review of Systems Denies fever chills No sinus pain or congestion. No chest pain, difficulty breathing, palpitations. No rash anywhere No headache, dizziness, diplopia, visual deficits. No slurred speech. No motor deficits other than the right side of the face.  Past Medical History  Diagnosis Date  . Hypertension   . Endometriosis   . 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 Reactive airway disease   . Allergy     rhinitis  . Kidney stone   . GERD (gastroesophageal reflux disease)   . Asthma   . Pneumonia   . H/O hiatal hernia   . Arthritis     left foot    Past Surgical History  Procedure Laterality Date  . Tonsillectomy and adenoidectomy  1967  . Uterine lining ablation  2002  . Laparatomy  1985  . Radial head arthroplasty Left 02/23/2013    Procedure: LEFT RADIAL HEAD ARTHROPLASTY qwith ligament reconstruction;  Surgeon: Dominica Severin, MD;  Location: [facility] OR;  Service: Orthopedics;  Laterality: Left;    Social History   Social History  . Marital Status: Married    Spouse Name: N/A  . Number of Children: N/A  . Years of Education: N/A   Occupational History  .      unemployed   Social History Main Topics  . Smoking status: Never Smoker   . Smokeless tobacco: Never Used  . Alcohol Use: No  . Drug Use: No  . Sexual Activity:    Partners: Male   Other Topics Concern  . Not on file   Social History Narrative   Exercise-- no        Medication List       This list is accurate as of: 02/27/15 11:59 PM.  Always use your most recent med list.               albuterol 108 (90 BASE) MCG/ACT inhaler  Commonly known as:  VENTOLIN HFA  Inhale 2 puffs into the lungs every 6 (six) hours as needed for wheezing or shortness of breath. For shortness of breath     atorvastatin 10 MG tablet  Commonly known as:  LIPITOR  Take 1 tablet (10 mg total) by mouth every other day.     beclomethasone 40 MCG/ACT inhaler  Commonly known as:  QVAR  Inhale 2 puffs into the lungs 2 (two) times daily.     calcium carbonate 1250 (500 CA) MG tablet  Commonly known as:  OS-CAL - dosed in mg of elemental calcium  Take 1 tablet by mouth.     cholecalciferol 1000 UNITS tablet  Commonly known as:  VITAMIN D  Take 1,000 Units by mouth daily.     dexlansoprazole 60 MG capsule  Commonly known as:  DEXILANT  Take 1 capsule by mouth  daily     fluticasone 27.5 MCG/SPRAY nasal spray  Commonly  known as:  VERAMYST  Place 2 sprays into the nose daily. Pt failed fluticasone     gabapentin 300 MG capsule  Commonly known as:  NEURONTIN  Take 1 capsule (300 mg total) by mouth 2 (two) times daily.     glucose blood test strip  ONETOUCH VERIO-CHECK BLOOD SUGAR THREE TIMES A DAY     levalbuterol 1.25 MG/3ML nebulizer solution  Commonly known as:  XOPENEX  Take 1.25 mg by nebulization every 6 (six) hours as needed for wheezing or shortness of breath. 3 ml nebs     losartan 100 MG tablet  Commonly known as:  COZAAR  Take 1 tablet by mouth  daily     montelukast 10 MG tablet  Commonly known as:  SINGULAIR  Take 1 tablet by mouth at  bedtime     nateglinide 60 MG tablet  Commonly known as:  STARLIX  Take 1 tablet (60 mg total) by mouth 3 (three) times daily with meals.     ONETOUCH DELICA LANCETS 33G Misc  Check Blood sugar three times per day     SitaGLIPtin-MetFORMIN HCl [phone] MG Tb24  Commonly known as:   JANUMET XR  Take 1 tablet by mouth  every day     valACYclovir 1000 MG tablet  Commonly known as:  VALTREX  Take 1 tablet (1,000 mg total) by mouth 3 (three) times daily.     vitamin C 1000 MG tablet  Take 1,000 mg by mouth daily.           Objective:   Physical Exam BP 118/66 mmHg  Pulse 95  Temp(Src) 98 F (36.7 C) (Oral)  Ht 5\' 3"  (1.6 m)  Wt 164 lb (74.39 kg)  BMI 29.06 kg/m2  SpO2 98% General:   Well developed, well nourished . NAD.  Neck:  Full range of motion. Supple. No  thyromegaly , normal carotid pulse HEENT:  Normocephalic . Face symmetric, atraumatic. TMs normal, throat without redness Lungs:  CTA B Normal respiratory effort, no intercostal retractions, no accessory muscle use. Heart: RRR,  no murmur.  No pretibial edema bilaterally  Skin: Exposed areas without rash. Not pale. Not jaundice Neurologic:  alert & oriented X3.  Speech normal, gait appropriate for age and unassisted DTRs symmetric EOMI, pupils equal and reactive, no ptosis, + upper and lower facial muscles week otherwise motor symmetric  Uvula , tongue midline. Facial sensitivity normal. Psych: Cognition and judgment appear intact.  Cooperative with normal attention span and concentration.  Behavior appropriate. No anxious or depressed appearing.    Assessment & Plan:   Facial paresthesias, history of Bell palsy: The patient is having facial paresthesias in the territory of the previous Bell palsy along with a ill-defined ear pain without evidence of a ear or sinus infection. She is somewhat concerned about a stroke. Neurological exam is only positive for right facial weakness which seems to be her baseline compared to last  neurology notes. She does not have symptoms suggestive of a stroke at this point. Nevertheless I recommended a CT to be sure, she declined. I wonder if she is developing a neuralgia on the right facial nerve territory , other consideration is early  shingles. Plan: Valtrex Gabapentin Refer to neurology (patient is somewhat concerned about see the neurologist mostly due to cost recommend to see PCP in 2 weeks if she decided not to see the neurologist) ER if problems, see instructions.

## 2015-02-27 NOTE — Patient Instructions (Signed)
Start Valtrex today  Start gabapentin 300 mg: 1 tablet at bedtime everyday for the next week, then one tablet twice a day  we are referring you to one of our neurologists, if you decide to cancel that appointment, then see your primary doctor in 2 weeks.  Go to the ER if you get worse, you have any symptoms consistent with a stroke such a slurred speech, any arm or leg weakness, dizziness, severe headache. Also let us know if you have any rash or difficulty with vision.

## 2015-02-27 NOTE — Progress Notes (Signed)
Pre visit review using our clinic review tool, if applicable. No additional management support is needed unless otherwise documented below in the visit note. 

## 2015-02-27 NOTE — Telephone Encounter (Signed)
Patient Name: Dominique EvenerRICIA Sconyers  DOB: 06/08/1960    Initial Comment Caller states pt has numbness on RT side of face; had bells palsy 2 yrs ago Nov;    Nurse Assessment  Nurse: Sherilyn CooterHenry, RN, Thurmond ButtsWade Date/Time Lamount Cohen(Eastern Time): 02/27/2015 2:11:03 PM  Confirm and document reason for call. If symptomatic, describe symptoms. ---Caller states that the right side of her face is going numb. It isn't completely numb. She had Bells Palsy about 2 years ago in November. She has numbness near her eye that goes toward her mouth. She does not have numbness in her nose, ear or forehead area She tried to drink some hot tea and it came out of her mouth. Her mouth has not been symmetrical since the previous episode, but it looks worse now than before When asked if this felt like she did when she had the Bells Palsy and she states "it sorta does."  Has the patient traveled out of the country within the last 30 days? ---No  Does the patient have any new or worsening symptoms? ---Yes  Will a triage be completed? ---Yes  Related visit to physician within the last 2 weeks? ---No  Does the PT have any chronic conditions? (i.e. diabetes, asthma, etc.) ---Yes  List chronic conditions. ---Diabetic, Asthma, HTN, Hypercholesterolemia     Guidelines    Guideline Title Affirmed Question Affirmed Notes  Neurologic Deficit Bell's palsy suspected (i.e., weakness on only one side of the face, developing over hours to days, no other symptoms)    Final Disposition User   Go to ED Now (or PCP triage) Sherilyn CooterHenry, RN, Thurmond ButtsWade    Comments  Appointment scheduled for 4:00pm today with Dr. Willow OraJose Paz.   Disagree/Comply: Comply

## 2015-02-27 NOTE — Telephone Encounter (Signed)
Patient called to see why her atorvastatin was cancelled, I made her aware it was not on her medication list. Per patient it was changed back in Sept. I reviewed notes and MD changed it but it was not changed int he chart, I went ahead and sent it as directed and also advised to have labs checked in Nov, labs orders sent [facility] as well as an RX mailed to the patient per her request to send to mail order. Pravastatin removed from her medication list.    KP

## 2015-03-02 NOTE — Telephone Encounter (Signed)
Pt saw Dr. Drue NovelPaz on 02/27/15.

## 2015-03-11 ENCOUNTER — Telehealth: Payer: Self-pay | Admitting: Internal Medicine

## 2015-03-11 NOTE — Telephone Encounter (Signed)
Was recently seen at the office with facial paresthesias, please check on the patient see how she's doing

## 2015-03-11 NOTE — Telephone Encounter (Signed)
Left message for pt to call back  °

## 2015-03-23 ENCOUNTER — Telehealth: Payer: Self-pay | Admitting: Family Medicine

## 2015-03-23 DIAGNOSIS — E1165 Type 2 diabetes mellitus with hyperglycemia: Principal | ICD-10-CM

## 2015-03-23 DIAGNOSIS — E1151 Type 2 diabetes mellitus with diabetic peripheral angiopathy without gangrene: Secondary | ICD-10-CM

## 2015-03-23 DIAGNOSIS — IMO0002 Reserved for concepts with insufficient information to code with codable children: Secondary | ICD-10-CM

## 2015-03-23 MED ORDER — SITAGLIP PHOS-METFORMIN HCL ER 100-1000 MG PO TB24: ORAL_TABLET | ORAL | Status: DC

## 2015-03-23 NOTE — Telephone Encounter (Signed)
Caller name: Elease Hashimotoatricia   Relationship to patient: Self   Can be reached: [phone]   [facility] [address]  Reason for call: Pt is requesting a refill on her JANUMET XR Rx.

## 2015-03-23 NOTE — Telephone Encounter (Signed)
Rx faxed.    KP 

## 2015-04-07 ENCOUNTER — Other Ambulatory Visit: Payer: Self-pay

## 2015-04-07 ENCOUNTER — Telehealth: Payer: Self-pay

## 2015-04-07 DIAGNOSIS — E785 Hyperlipidemia, unspecified: Secondary | ICD-10-CM

## 2015-04-07 LAB — HEPATIC FUNCTION PANEL
ALK PHOS: 83
ALT: 52
AST: 37 U/L
Albumin Serum: 4.6
BILIRUBIN, DIRECT: 0.3
Bilirubin, Total: 1.4
Protein S, Total: 6.7

## 2015-04-07 LAB — LIPID PANEL
CHOLESTEROL, TOTAL: 153
HDL: 42
LDL CALC: 61 mg/dL
TRIGLYCERIDES: 248
Total CHOL/HDL Ratio: 3.6
VLDL: 50 mg/dL

## 2015-04-07 MED ORDER — FENOFIBRATE 160 MG PO TABS: 160.0000 mg | ORAL_TABLET | Freq: Every day | ORAL | Status: DC

## 2015-04-07 NOTE — Telephone Encounter (Addendum)
Lab entered into the system.    KP

## 2015-04-30 ENCOUNTER — Telehealth: Payer: Self-pay | Admitting: Family Medicine

## 2015-04-30 NOTE — Telephone Encounter (Signed)
Left message for patient to call about flu shot °

## 2015-05-01 ENCOUNTER — Ambulatory Visit (INDEPENDENT_AMBULATORY_CARE_PROVIDER_SITE_OTHER): Payer: 59

## 2015-05-01 DIAGNOSIS — Z23 Encounter for immunization: Secondary | ICD-10-CM

## 2015-06-11 ENCOUNTER — Other Ambulatory Visit: Payer: Self-pay | Admitting: Family Medicine

## 2015-06-11 NOTE — Telephone Encounter (Signed)
Refilled patients medication with #270 and 0 rf

## 2015-07-07 ENCOUNTER — Ambulatory Visit (INDEPENDENT_AMBULATORY_CARE_PROVIDER_SITE_OTHER): Payer: 59 | Admitting: Family Medicine

## 2015-07-07 ENCOUNTER — Encounter: Payer: Self-pay | Admitting: Family Medicine

## 2015-07-07 VITALS — BP 116/74 | HR 86 | Temp 99.0°F | Ht 63.0 in | Wt 163.2 lb

## 2015-07-07 DIAGNOSIS — E785 Hyperlipidemia, unspecified: Secondary | ICD-10-CM | POA: Diagnosis not present

## 2015-07-07 DIAGNOSIS — I10 Essential (primary) hypertension: Secondary | ICD-10-CM

## 2015-07-07 DIAGNOSIS — E1151 Type 2 diabetes mellitus with diabetic peripheral angiopathy without gangrene: Secondary | ICD-10-CM

## 2015-07-07 DIAGNOSIS — Z1159 Encounter for screening for other viral diseases: Secondary | ICD-10-CM

## 2015-07-07 DIAGNOSIS — E1165 Type 2 diabetes mellitus with hyperglycemia: Secondary | ICD-10-CM

## 2015-07-07 DIAGNOSIS — IMO0002 Reserved for concepts with insufficient information to code with codable children: Secondary | ICD-10-CM

## 2015-07-07 MED ORDER — ATORVASTATIN CALCIUM 10 MG PO TABS: 10.0000 mg | ORAL_TABLET | ORAL | Status: DC

## 2015-07-07 MED ORDER — FENOFIBRATE 160 MG PO TABS: 160.0000 mg | ORAL_TABLET | Freq: Every day | ORAL | Status: DC

## 2015-07-07 MED ORDER — NONFORMULARY OR COMPOUNDED ITEM: Status: DC

## 2015-07-07 MED ORDER — NATEGLINIDE 60 MG PO TABS: ORAL_TABLET | ORAL | Status: DC

## 2015-07-07 NOTE — Progress Notes (Signed)
Pre visit review using our clinic review tool, if applicable. No additional management support is needed unless otherwise documented below in the visit note. 

## 2015-07-07 NOTE — Progress Notes (Signed)
Patient ID: Dominique [name], female    DOB: Dec 30, 1960  Age: 55 y.o. MRN: [id number]    Subjective:  Subjective HPI Dominique [name] presents for f/u dm , cholesterol and bp   HYPERTENSION  Blood pressure range-not checking  Chest pain- no      Dyspnea- no Lightheadedness- no   Edema- no Other side effects - no   Medication compliance: good Low salt diet- yes  DIABETES  Blood Sugar ranges-90-156  Polyuria- no New Visual problems- no Hypoglycemic symptoms- yes Other side effects-no Medication compliance - good Last eye exam- due Foot exam- today  HYPERLIPIDEMIA  Medication compliance- goood RUQ pain- no  Muscle aches- no Other side effects-no     Review of Systems  Constitutional: Negative for diaphoresis, appetite change, fatigue and unexpected weight change.  Eyes: Negative for pain, redness and visual disturbance.  Respiratory: Negative for cough, chest tightness, shortness of breath and wheezing.   Cardiovascular: Negative for chest pain, palpitations and leg swelling.  Endocrine: Negative for cold intolerance, heat intolerance, polydipsia, polyphagia and polyuria.  Genitourinary: Negative for dysuria, frequency and difficulty urinating.  Neurological: Negative for dizziness, light-headedness, numbness and headaches.    History Past Medical History  Diagnosis Date  . Hypertension   . Endometriosis   . 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 Reactive airway disease   . Allergy     rhinitis  . Kidney stone   . GERD (gastroesophageal reflux disease)   . Asthma   . Pneumonia   . H/O hiatal hernia   . Arthritis     left foot    She has past surgical history that includes Tonsillectomy and adenoidectomy (1967); uterine lining ablation (2002); laparatomy (1985); and Radial head arthroplasty (Left, 02/23/2013).   Her family history includes Diabetes in her father and mother; Hyperlipidemia in her father and mother; Hypertension in her  father and mother.She reports that she has never smoked. She has never used smokeless tobacco. She reports that she does not drink alcohol or use illicit drugs.  Current Outpatient Prescriptions on File Prior to Visit  Medication Sig Dispense Refill  . albuterol (VENTOLIN HFA) 108 (90 BASE) MCG/ACT inhaler Inhale 2 puffs into the lungs every 6 (six) hours as needed for wheezing or shortness of breath. For shortness of breath 3 Inhaler 1  . Ascorbic Acid (VITAMIN C) 1000 MG tablet Take 1,000 mg by mouth daily.    . beclomethasone (QVAR) 40 MCG/ACT inhaler Inhale 2 puffs into the lungs 2 (two) times daily. 3 Inhaler 3  . calcium carbonate (OS-CAL - DOSED IN MG OF ELEMENTAL CALCIUM) 1250 MG tablet Take 1 tablet by mouth.    . cholecalciferol (VITAMIN D) 1000 UNITS tablet Take 1,000 Units by mouth daily.    . fluticasone (VERAMYST) 27.5 MCG/SPRAY nasal spray Place 2 sprays into the nose daily. Pt failed fluticasone 30 g 3  . glucose blood test strip ONETOUCH VERIO-CHECK BLOOD SUGAR THREE TIMES A DAY 200 each 12  . levalbuterol (XOPENEX) 1.25 MG/3ML nebulizer solution Take 1.25 mg by nebulization every 6 (six) hours as needed for wheezing or shortness of breath. 3 ml nebs 72 mL 1  . losartan (COZAAR) 100 MG tablet Take 1 tablet by mouth  daily 90 tablet 3  . montelukast (SINGULAIR) 10 MG tablet Take 1 tablet by mouth at  bedtime 90 tablet 3  . SitaGLIPtin-MetFORMIN HCl (JANUMET XR) [phone] MG TB24 Take 1 tablet by mouth  every day 90  tablet 1   No current facility-administered medications on file prior to visit.     Objective:  Objective Physical Exam  Constitutional: She is oriented to person, place, and time. She appears well-developed and well-nourished.  HENT:  Head: Normocephalic and atraumatic.  Eyes: Conjunctivae and EOM are normal.  Neck: Normal range of motion. Neck supple. No JVD present. Carotid bruit is not present. No thyromegaly present.  Cardiovascular: Normal rate, regular  rhythm and normal heart sounds.   No murmur heard. Pulmonary/Chest: Effort normal and breath sounds normal. No respiratory distress. She has no wheezes. She has no rales. She exhibits no tenderness.  Musculoskeletal: She exhibits no edema.  Neurological: She is alert and oriented to person, place, and time.  Psychiatric: She has a normal mood and affect. Her behavior is normal. Thought content normal.  Nursing note and vitals reviewed. Sensory exam of the foot is normal, tested with the monofilament. Good pulses, no lesions or ulcers, good peripheral pulses.   BP 116/74 mmHg  Pulse 86  Temp(Src) 99 F (37.2 C) (Oral)  Ht  (1.6 m)  Wt 163 lb 3.2 oz (74.027 kg)  BMI 28.92 kg/m2  SpO2 98% Wt Readings from Last 3 Encounters:  07/07/15 163 lb 3.2 oz (74.027 kg)  02/27/15 164 lb (74.39 kg)  12/30/14 164 lb (74.39 kg)     Lab Results  Component Value Date   WBC 4.9 03/24/2014   HGB 16.5* 03/24/2014   HCT 48* 03/24/2014   PLT 166 03/24/2014   GLUCOSE 211* 04/03/2013   CHOL 153 03/30/2015   TRIG 248 03/30/2015   HDL 42 03/30/2015   LDLCALC 61 03/30/2015   ALT 52 03/30/2015   AST 37 03/30/2015   NA 137 03/24/2014   K 4.3 03/24/2014   CL 100 04/03/2013   CREATININE 0.9 12/09/2014   BUN 14 03/24/2014   CO2 29 04/03/2013   TSH 1.56 03/24/2014   HGBA1C 6.4* 12/09/2014    No results found.   Assessment & Plan:  Plan I have discontinued Dominique [name]'s dexlansoprazole, valACYclovir, and gabapentin. I am also having her start on NONFORMULARY OR COMPOUNDED ITEM and NONFORMULARY OR COMPOUNDED ITEM. Additionally, I am having her maintain her vitamin C, cholecalciferol, calcium carbonate, albuterol, beclomethasone, levalbuterol, glucose blood, fluticasone, montelukast, losartan, SitaGLIPtin-MetFORMIN HCl, nateglinide, fenofibrate, and atorvastatin.  Meds ordered this encounter  Medications  . NONFORMULARY OR COMPOUNDED ITEM    Sig: Cmp, lipid, hgba1c,  Dx dm II uncontrolled,  hyperlipidemia, htn    Dispense:  1 each    Refill:  0  . NONFORMULARY OR COMPOUNDED ITEM    Sig: Hep C antibody--- dx need for hep c screening    Dispense:  1 each    Refill:  0  . nateglinide (STARLIX) 60 MG tablet    Sig: Take 1 tablet by mouth 3  times daily with meals    Dispense:  270 tablet    Refill:  3  . fenofibrate 160 MG tablet    Sig: Take 1 tablet (160 mg total) by mouth daily.    Dispense:  90 tablet    Refill:  1  . atorvastatin (LIPITOR) 10 MG tablet    Sig: Take 1 tablet (10 mg total) by mouth every other day.    Dispense:  45 tablet    Refill:  1    Problem List Items Addressed This Visit      Unprioritized   Essential hypertension   Relevant Medications   NONFORMULARY  OR COMPOUNDED ITEM   fenofibrate 160 MG tablet   atorvastatin (LIPITOR) 10 MG tablet    Other Visit Diagnoses    DM (diabetes mellitus) type II uncontrolled, periph vascular disorder (HCC)    -  Primary    Relevant Medications    NONFORMULARY OR COMPOUNDED ITEM    nateglinide (STARLIX) 60 MG tablet    fenofibrate 160 MG tablet    atorvastatin (LIPITOR) 10 MG tablet    Hyperlipidemia        Relevant Medications    NONFORMULARY OR COMPOUNDED ITEM    fenofibrate 160 MG tablet    atorvastatin (LIPITOR) 10 MG tablet    Need for hepatitis C screening test        Relevant Medications    NONFORMULARY OR COMPOUNDED ITEM       Follow-up: Return in about 6 months (around 01/04/2016), or if symptoms worsen or fail to improve, for hypertension, hyperlipidemia, diabetes II.  Loreen Freud, DO

## 2015-07-20 ENCOUNTER — Telehealth: Payer: Self-pay | Admitting: Family Medicine

## 2015-07-20 NOTE — Telephone Encounter (Signed)
Enclosed is a copy of your labs and they have improved. Your cholesterol is Great and your diabetes is stable. Continue to watch your diet and exercise and recheck in 3 months.

## 2015-07-20 NOTE — Telephone Encounter (Signed)
Pt had labs done 07/09/15 at [facility] and she still has not received results. She is requesting call with results at [phone].

## 2015-07-20 NOTE — Telephone Encounter (Signed)
Patient aware and verbalized understanding.     KP 

## 2015-08-19 ENCOUNTER — Other Ambulatory Visit: Payer: Self-pay | Admitting: Family Medicine

## 2015-10-09 ENCOUNTER — Other Ambulatory Visit: Payer: Self-pay | Admitting: Family Medicine

## 2015-12-16 ENCOUNTER — Other Ambulatory Visit: Payer: Self-pay | Admitting: Family Medicine

## 2015-12-16 DIAGNOSIS — I10 Essential (primary) hypertension: Secondary | ICD-10-CM

## 2016-01-04 ENCOUNTER — Ambulatory Visit (INDEPENDENT_AMBULATORY_CARE_PROVIDER_SITE_OTHER): Payer: 59 | Admitting: Family Medicine

## 2016-01-04 ENCOUNTER — Encounter: Payer: Self-pay | Admitting: Family Medicine

## 2016-01-04 VITALS — BP 126/78 | HR 87 | Temp 97.7°F | Wt 162.4 lb

## 2016-01-04 DIAGNOSIS — H8110 Benign paroxysmal vertigo, unspecified ear: Secondary | ICD-10-CM | POA: Diagnosis not present

## 2016-01-04 DIAGNOSIS — E785 Hyperlipidemia, unspecified: Secondary | ICD-10-CM

## 2016-01-04 DIAGNOSIS — I10 Essential (primary) hypertension: Secondary | ICD-10-CM | POA: Diagnosis not present

## 2016-01-04 DIAGNOSIS — IMO0002 Reserved for concepts with insufficient information to code with codable children: Secondary | ICD-10-CM

## 2016-01-04 DIAGNOSIS — E1151 Type 2 diabetes mellitus with diabetic peripheral angiopathy without gangrene: Secondary | ICD-10-CM

## 2016-01-04 DIAGNOSIS — E1165 Type 2 diabetes mellitus with hyperglycemia: Secondary | ICD-10-CM

## 2016-01-04 DIAGNOSIS — J302 Other seasonal allergic rhinitis: Secondary | ICD-10-CM

## 2016-01-04 MED ORDER — ATORVASTATIN CALCIUM 10 MG PO TABS: 10.0000 mg | ORAL_TABLET | ORAL | 1 refills | Status: DC

## 2016-01-04 MED ORDER — SITAGLIP PHOS-METFORMIN HCL ER 50-1000 MG PO TB24: ORAL_TABLET | ORAL | 1 refills | Status: DC

## 2016-01-04 MED ORDER — MECLIZINE HCL 25 MG PO TABS
25.0000 mg | ORAL_TABLET | Freq: Three times a day (TID) | ORAL | 0 refills | Status: DC | PRN
Start: 2016-01-04 — End: 2016-01-04

## 2016-01-04 MED ORDER — NATEGLINIDE 60 MG PO TABS: ORAL_TABLET | ORAL | 1 refills | Status: DC

## 2016-01-04 MED ORDER — MONTELUKAST SODIUM 10 MG PO TABS: 10.0000 mg | ORAL_TABLET | Freq: Every day | ORAL | 1 refills | Status: DC

## 2016-01-04 MED ORDER — FENOFIBRATE 160 MG PO TABS: 160.0000 mg | ORAL_TABLET | Freq: Every day | ORAL | 1 refills | Status: DC

## 2016-01-04 MED ORDER — LOSARTAN POTASSIUM 100 MG PO TABS: 100.0000 mg | ORAL_TABLET | Freq: Every day | ORAL | 0 refills | Status: DC

## 2016-01-04 MED ORDER — MECLIZINE HCL 25 MG PO TABS: 25.0000 mg | ORAL_TABLET | Freq: Three times a day (TID) | ORAL | 0 refills | Status: DC | PRN

## 2016-01-04 NOTE — Assessment & Plan Note (Signed)
epley manuvers-- ho meclizine

## 2016-01-04 NOTE — Progress Notes (Signed)
Patient ID: Dominique [name], female    DOB: 11/25/1960  Age: 55 y.o. MRN: [id number]    Subjective:  Subjective  HPI Dominique [name] presents with c/o vertigo on Saturday.  She took dramamine an it helped.  It has improved a lot but is not completely resolved.    She is also here for f/u Dm and labs.  No other complaints.    Review of Systems  Constitutional: Negative for appetite change, diaphoresis, fatigue and unexpected weight change.  Eyes: Negative for pain, redness and visual disturbance.  Respiratory: Negative for cough, chest tightness, shortness of breath and wheezing.   Cardiovascular: Negative for chest pain, palpitations and leg swelling.  Endocrine: Negative for cold intolerance, heat intolerance, polydipsia, polyphagia and polyuria.  Genitourinary: Negative for difficulty urinating, dysuria and frequency.  Neurological: Positive for dizziness. Negative for light-headedness, numbness and headaches.    History Past Medical History:  Diagnosis Date  . Allergy    rhinitis  . Arthritis    left foot  . Asthma   . Endometriosis   . GERD (gastroesophageal reflux disease)   . H/O hiatal hernia   . Histoplasmosis 2002   [place] of, [place]  . Hypertension   . Kidney stone   . Pneumonia   . Reactive airway disease     She has a past surgical history that includes Tonsillectomy and adenoidectomy (1967); uterine lining ablation (2002); laparatomy (1985); and Radial head arthroplasty (Left, 02/23/2013).   Her family history includes Diabetes in her father and mother; Hyperlipidemia in her father and mother; Hypertension in her father and mother.She reports that she has never smoked. She has never used smokeless tobacco. She reports that she does not drink alcohol or use drugs.  Current Outpatient Prescriptions on File Prior to Visit  Medication Sig Dispense Refill  . albuterol (VENTOLIN HFA) 108 (90 BASE) MCG/ACT inhaler Inhale 2 puffs into the lungs every 6  (six) hours as needed for wheezing or shortness of breath. For shortness of breath 3 Inhaler 1  . Ascorbic Acid (VITAMIN C) 1000 MG tablet Take 1,000 mg by mouth daily.    . beclomethasone (QVAR) 40 MCG/ACT inhaler Inhale 2 puffs into the lungs 2 (two) times daily. 3 Inhaler 3  . calcium carbonate (OS-CAL - DOSED IN MG OF ELEMENTAL CALCIUM) 1250 MG tablet Take 1 tablet by mouth.    . cholecalciferol (VITAMIN D) 1000 UNITS tablet Take 1,000 Units by mouth daily.    . fluticasone (VERAMYST) 27.5 MCG/SPRAY nasal spray Place 2 sprays into the nose daily. Pt failed fluticasone 30 g 3  . glucose blood test strip ONETOUCH VERIO-CHECK BLOOD SUGAR THREE TIMES A DAY 200 each 12  . levalbuterol (XOPENEX) 1.25 MG/3ML nebulizer solution Take 1.25 mg by nebulization every 6 (six) hours as needed for wheezing or shortness of breath. 3 ml nebs 72 mL 1  . NONFORMULARY OR COMPOUNDED ITEM Cmp, lipid, hgba1c,  Dx dm II uncontrolled, hyperlipidemia, htn 1 each 0  . NONFORMULARY OR COMPOUNDED ITEM Hep C antibody--- dx need for hep c screening 1 each 0   No current facility-administered medications on file prior to visit.      Objective:  Objective  Physical Exam  Constitutional: She is oriented to person, place, and time. She appears well-developed and well-nourished.  HENT:  Head: Normocephalic and atraumatic.  Right Ear: External ear normal.  Left Ear: External ear normal.  Eyes: Conjunctivae and EOM are normal.  Neck: Normal range of motion. Neck  supple. No JVD present. Carotid bruit is not present. No thyromegaly present.  Cardiovascular: Normal rate, regular rhythm and normal heart sounds.   No murmur heard. Pulmonary/Chest: Effort normal and breath sounds normal. No respiratory distress. She has no wheezes. She has no rales. She exhibits no tenderness.  Musculoskeletal: She exhibits no edema.  Neurological: She is alert and oriented to person, place, and time.  Psychiatric: She has a normal mood and  affect. Her behavior is normal. Judgment and thought content normal.  Nursing note and vitals reviewed. Sensory exam of the foot is normal, tested with the monofilament. Good pulses, no lesions or ulcers, good peripheral pulses.  BP 126/78 (BP Location: Left Arm, Patient Position: Sitting, Cuff Size: Normal)   Pulse 87   Temp 97.7 F (36.5 C) (Oral)   Wt 162 lb 6.4 oz (73.7 kg)   SpO2 97%   BMI 28.77 kg/m  Wt Readings from Last 3 Encounters:  01/04/16 162 lb 6.4 oz (73.7 kg)  07/07/15 163 lb 3.2 oz (74 kg)  02/27/15 164 lb (74.4 kg)     Lab Results  Component Value Date   WBC 4.9 03/24/2014   HGB 16.5 (A) 03/24/2014   HCT 48 (A) 03/24/2014   PLT 166 03/24/2014   GLUCOSE 211 (H) 04/03/2013   CHOL 153 03/30/2015   TRIG 248 03/30/2015   HDL 42 03/30/2015   LDLCALC 61 03/30/2015   ALT 52 03/30/2015   AST 37 03/30/2015   NA 137 03/24/2014   K 4.3 03/24/2014   CL 100 04/03/2013   CREATININE 0.9 12/09/2014   BUN 14 03/24/2014   CO2 29 04/03/2013   TSH 1.56 03/24/2014   HGBA1C 6.4 (A) 12/09/2014    No results found.   Assessment & Plan:  Plan  I have discontinued Dominique [name]'s JANUMET XR. I have also changed her montelukast, losartan, fenofibrate, and atorvastatin. Additionally, I am having her maintain her vitamin C, cholecalciferol, calcium carbonate, albuterol, beclomethasone, levalbuterol, glucose blood, fluticasone, NONFORMULARY OR COMPOUNDED ITEM, NONFORMULARY OR COMPOUNDED ITEM, meclizine, SitaGLIPtin-MetFORMIN HCl, and nateglinide.  Meds ordered this encounter  Medications  . DISCONTD: SitaGLIPtin-MetFORMIN HCl (JANUMET XR) 50-1000 MG TB24    Sig: 2 po qd    Dispense:  180 tablet    Refill:  1  . DISCONTD: meclizine (ANTIVERT) 25 MG tablet    Sig: Take 1 tablet (25 mg total) by mouth 3 (three) times daily as needed for dizziness.    Dispense:  30 tablet    Refill:  0  . meclizine (ANTIVERT) 25 MG tablet    Sig: Take 1 tablet (25 mg total) by mouth 3 (three)  times daily as needed for dizziness.    Dispense:  30 tablet    Refill:  0  . SitaGLIPtin-MetFORMIN HCl (JANUMET XR) 50-1000 MG TB24    Sig: 2 po qd    Dispense:  180 tablet    Refill:  1  . nateglinide (STARLIX) 60 MG tablet    Sig: Take 1 tablet by mouth 3  times daily with meals    Dispense:  270 tablet    Refill:  1  . montelukast (SINGULAIR) 10 MG tablet    Sig: Take 1 tablet (10 mg total) by mouth at bedtime.    Dispense:  90 tablet    Refill:  1  . losartan (COZAAR) 100 MG tablet    Sig: Take 1 tablet (100 mg total) by mouth daily.    Dispense:  90 tablet  Refill:  0  . fenofibrate 160 MG tablet    Sig: Take 1 tablet (160 mg total) by mouth daily.    Dispense:  90 tablet    Refill:  1  . atorvastatin (LIPITOR) 10 MG tablet    Sig: Take 1 tablet (10 mg total) by mouth every other day.    Dispense:  45 tablet    Refill:  1    Problem List Items Addressed This Visit      Unprioritized   BPV (benign positional vertigo)    epley manuvers-- ho meclizine      Relevant Medications   meclizine (ANTIVERT) 25 MG tablet   Essential hypertension    Stable con't meds      Relevant Medications   losartan (COZAAR) 100 MG tablet   fenofibrate 160 MG tablet   atorvastatin (LIPITOR) 10 MG tablet    Other Visit Diagnoses    DM (diabetes mellitus) type II uncontrolled, periph vascular disorder (HCC)    -  Primary   Relevant Medications   SitaGLIPtin-MetFORMIN HCl (JANUMET XR) 50-1000 MG TB24   nateglinide (STARLIX) 60 MG tablet   losartan (COZAAR) 100 MG tablet   fenofibrate 160 MG tablet   atorvastatin (LIPITOR) 10 MG tablet   Hyperlipemia       Relevant Medications   losartan (COZAAR) 100 MG tablet   fenofibrate 160 MG tablet   atorvastatin (LIPITOR) 10 MG tablet   Seasonal allergies       Relevant Medications   montelukast (SINGULAIR) 10 MG tablet      Follow-up: Return in about 6 months (around 07/06/2016) for hypertension, hyperlipidemia, diabetes  II.  Donato SchultzYvonne R Lowne Chase, DO

## 2016-01-04 NOTE — Progress Notes (Signed)
Pre visit review using our clinic review tool, if applicable. No additional management support is needed unless otherwise documented below in the visit note. 

## 2016-01-04 NOTE — Assessment & Plan Note (Signed)
Check labs con't meds 

## 2016-01-04 NOTE — Assessment & Plan Note (Signed)
Stable con't meds 

## 2016-01-04 NOTE — Patient Instructions (Signed)
Epley Maneuver Self-Care  WHAT IS THE EPLEY MANEUVER?  The Epley maneuver is an exercise you can do to relieve symptoms of benign paroxysmal positional vertigo (BPPV). This condition is often just referred to as vertigo. BPPV is caused by the movement of tiny crystals (canaliths) inside your inner ear. The accumulation and movement of canaliths in your inner ear causes a sudden spinning sensation (vertigo) when you move your head to certain positions. Vertigo usually lasts about 30 seconds. BPPV usually occurs in just one ear. If you get vertigo when you lie on your left side, you probably have BPPV in your left ear. Your health care provider can tell you which ear is involved.   BPPV may be caused by a head injury. Many people older than 50 get BPPV for unknown reasons. If you have been diagnosed with BPPV, your health care provider may teach you how to do this maneuver. BPPV is not life threatening (benign) and usually goes away in time.   WHEN SHOULD I PERFORM THE EPLEY MANEUVER?  You can do this maneuver at home whenever you have symptoms of vertigo. You may do the Epley maneuver up to 3 times a day until your symptoms of vertigo go away.  HOW SHOULD I DO THE EPLEY MANEUVER?  1. Sit on the edge of a bed or table with your back straight. Your legs should be extended or hanging over the edge of the bed or table.    2. Turn your head halfway toward the affected ear.    3. Lie backward quickly with your head turned until you are lying flat on your back. You may want to position a pillow under your shoulders.    4. Hold this position for 30 seconds. You may experience an attack of vertigo. This is normal. Hold this position until the vertigo stops.  5. Then turn your head to the opposite direction until your unaffected ear is facing the floor.    6. Hold this position for 30 seconds. You may experience an attack of vertigo. This is normal. Hold this position until the vertigo stops.  7. Now turn your whole body to  the same side as your head. Hold for another 30 seconds.    8. You can then sit back up.  ARE THERE RISKS TO THIS MANEUVER?  In some cases, you may have other symptoms (such as changes in your vision, weakness, or numbness). If you have these symptoms, stop doing the maneuver and call your health care provider. Even if doing these maneuvers relieves your vertigo, you may still have dizziness. Dizziness is the sensation of light-headedness but without the sensation of movement. Even though the Epley maneuver may relieve your vertigo, it is possible that your symptoms will return within 5 years.  WHAT SHOULD I DO AFTER THIS MANEUVER?  After doing the Epley maneuver, you can return to your normal activities. Ask your doctor if there is anything you should do at home to prevent vertigo. This may include:  · Sleeping with two or more pillows to keep your head elevated.  · Not sleeping on the side of your affected ear.  · Getting up slowly from bed.  · Avoiding sudden movements during the day.  · Avoiding extreme head movement, like looking up or bending over.  · Wearing a cervical collar to prevent sudden head movements.  WHAT SHOULD I DO IF MY SYMPTOMS GET WORSE?  Call your health care provider if your vertigo gets worse. Call your provider right way if   you have other symptoms, including:   · Nausea.  · Vomiting.  · Headache.  · Weakness.  · Numbness.  · Vision changes.     This information is not intended to replace advice given to you by your health care provider. Make sure you discuss any questions you have with your health care provider.     Document Released: 05/07/2013 Document Reviewed: 05/07/2013  Elsevier Interactive Patient Education ©2016 [employer]

## 2016-02-21 ENCOUNTER — Ambulatory Visit (HOSPITAL_COMMUNITY)
Admission: EM | Admit: 2016-02-21 | Discharge: 2016-02-21 | Disposition: A | Discharge status: Home / Self Care | Payer: 59 | Attending: Internal Medicine | Admitting: Internal Medicine

## 2016-02-21 ENCOUNTER — Encounter (HOSPITAL_COMMUNITY): Payer: Self-pay | Admitting: Emergency Medicine

## 2016-02-21 DIAGNOSIS — L089 Local infection of the skin and subcutaneous tissue, unspecified: Secondary | ICD-10-CM

## 2016-02-21 DIAGNOSIS — R42 Dizziness and giddiness: Secondary | ICD-10-CM

## 2016-02-21 DIAGNOSIS — S90822A Blister (nonthermal), left foot, initial encounter: Secondary | ICD-10-CM | POA: Diagnosis not present

## 2016-02-21 MED ORDER — CEPHALEXIN 500 MG PO CAPS: 500.0000 mg | ORAL_CAPSULE | Freq: Two times a day (BID) | ORAL | 0 refills | Status: DC

## 2016-02-21 MED ORDER — SULFAMETHOXAZOLE-TRIMETHOPRIM 800-160 MG PO TABS: 1.0000 | ORAL_TABLET | Freq: Two times a day (BID) | ORAL | 0 refills | Status: AC

## 2016-02-21 MED ORDER — FLUCONAZOLE 150 MG PO TABS: 150.0000 mg | ORAL_TABLET | Freq: Once | ORAL | 0 refills | Status: AC

## 2016-02-21 NOTE — Discharge Instructions (Addendum)
Wash heel wound gently with soap/water 1-2 x daily and apply antibiotic ointment/bandage. Prescriptions for bactrim and keflex were sent to the [facility] on [address]. Recheck or followup with primary care provider Seabron SpatesYvonne Lowne Chase for any increasing redness/swelling/pain/drainage or new fever >100.5.

## 2016-02-21 NOTE — ED Triage Notes (Signed)
[facility].   The patient complained of vertigo that started this date around 4:30am. The patient stated that she did take a dramamine and it did help however she still has some dizziness. The patient reported a previous hx of vertigo.  The patient also complained of a blister on her left foot that occurred 1 week ago.

## 2016-02-21 NOTE — ED Provider Notes (Signed)
[facility]    CSN: [id number] Arrival date & time: 02/21/16  1846     History   Chief Complaint Chief [id number]  Patient presents with  . Dizziness  . Blister    HPI Dominique [name] is a 55 y.o. female. She presents today with onset of some vertigo symptoms this morning, had rotatory type dizziness when she moved her head and laid back down in bed to the bathroom. Has not been vomiting. Symptoms responded to OTC Dramamine. She had similar symptoms a month ago. Her sister and her father have had similar symptoms. She is more concerned, as a diabetic, about a blister on the back of her left heel, caused by some tennis shoes. This occurred a week ago, and a couple days ago became sore, with surrounding redness. No fever, no malaise. The site is crusted but not draining. History of right Bell's palsy, mostly resolved, a few years ago  HPI  Past Medical History:  Diagnosis Date  . Allergy    rhinitis  . Arthritis    left foot  . Asthma   . Endometriosis   . GERD (gastroesophageal reflux disease)   . H/O hiatal hernia   . Histoplasmosis 2002   [place] of, [place]  . Hypertension   . Kidney stone   . Pneumonia   . Reactive airway disease     Patient Active Problem List   Diagnosis Date Noted  . BPV (benign positional vertigo) 01/04/2016  . Asthma with exacerbation 09/22/2014  . Acute bronchitis 04/24/2014  . Diabetes mellitus type II, uncontrolled (HCC) 03/27/2014  . Hyperlipidemia LDL goal <70 03/27/2014  . Bell's palsy 04/05/2013  . ALLERGIC RHINITIS 07/08/2010  . GERD 07/08/2010  . WEIGHT GAIN 01/13/2010  . KERATOSIS 10/09/2008  . BURSITIS, RIGHT SHOULDER 10/09/2008  . HOARSENESS, CHRONIC 03/24/2008  . OTITIS MEDIA, SEROUS, CHRONIC, RIGHT 03/26/2007  . Essential hypertension 03/14/2007    Past Surgical History:  Procedure Laterality Date  . laparatomy  1985  . RADIAL HEAD ARTHROPLASTY Left 02/23/2013   Procedure: LEFT RADIAL HEAD  ARTHROPLASTY qwith ligament reconstruction;  Surgeon: Dominica SeverinWilliam Gramig, MD;  Location: [facility];  Service: Orthopedics;  Laterality: Left;  . TONSILLECTOMY AND ADENOIDECTOMY  1967  . uterine lining ablation  2002     Home Medications    Prior to Admission medications   Medication Sig Start Date End Date Taking? Authorizing Provider  albuterol (VENTOLIN HFA) 108 (90 BASE) MCG/ACT inhaler Inhale 2 puffs into the lungs every 6 (six) hours as needed for wheezing or shortness of breath. For shortness of breath 11/26/13   Lelon PerlaYvonne R Lowne Chase, DO  Ascorbic Acid (VITAMIN C) 1000 MG tablet Take 1,000 mg by mouth daily.    Historical Provider, MD  atorvastatin (LIPITOR) 10 MG tablet Take 1 tablet (10 mg total) by mouth every other day. 01/04/16   Lelon PerlaYvonne R Lowne Chase, DO  beclomethasone (QVAR) 40 MCG/ACT inhaler Inhale 2 puffs into the lungs 2 (two) times daily. 11/26/13   Lelon PerlaYvonne R Lowne Chase, DO  calcium carbonate (OS-CAL - DOSED IN MG OF ELEMENTAL CALCIUM) 1250 MG tablet Take 1 tablet by mouth.    Historical Provider, MD  cephALEXin (KEFLEX) 500 MG capsule Take 1 capsule (500 mg total) by mouth 2 (two) times daily. 02/21/16   Eustace MooreLaura W Aron Inge, MD  cholecalciferol (VITAMIN D) 1000 UNITS tablet Take 1,000 Units by mouth daily.    Historical Provider, MD  fenofibrate 160 MG tablet Take 1 tablet (160  mg total) by mouth daily. 01/04/16   Grayling Congress Lowne Chase, DO  fluconazole (DIFLUCAN) 150 MG tablet Take 1 tablet (150 mg total) by mouth once. Repeat dose in 3d if needed. 02/21/16 02/21/16  Eustace Moore, MD  fluticasone (VERAMYST) 27.5 MCG/SPRAY nasal spray Place 2 sprays into the nose daily. Pt failed fluticasone 12/30/14 02/13/17  Lelon Perla Chase, DO  glucose blood test strip [facility] VERIO-CHECK BLOOD SUGAR THREE TIMES A DAY 04/04/14   Grayling Congress Lowne Chase, DO  levalbuterol (XOPENEX) 1.25 MG/3ML nebulizer solution Take 1.25 mg by nebulization every 6 (six) hours as needed for wheezing or shortness of breath. 3 ml  nebs 11/26/13   Yvonne R Lowne Chase, DO  losartan (COZAAR) 100 MG tablet Take 1 tablet (100 mg total) by mouth daily. 01/04/16   Lelon Perla Chase, DO  meclizine (ANTIVERT) 25 MG tablet Take 1 tablet (25 mg total) by mouth 3 (three) times daily as needed for dizziness. 01/04/16   Grayling Congress Lowne Chase, DO  montelukast (SINGULAIR) 10 MG tablet Take 1 tablet (10 mg total) by mouth at bedtime. 01/04/16   Lelon Perla Chase, DO  nateglinide (STARLIX) 60 MG tablet Take 1 tablet by mouth 3  times daily with meals 01/04/16   Lelon Perla Chase, DO  NONFORMULARY OR COMPOUNDED ITEM Cmp, lipid, hgba1c,  Dx dm II uncontrolled, hyperlipidemia, htn 07/07/15   Lelon Perla Chase, DO  NONFORMULARY OR COMPOUNDED ITEM Hep C antibody--- dx need for hep c screening 07/07/15   Lelon Perla Chase, DO  SitaGLIPtin-MetFORMIN HCl (JANUMET XR) 50-1000 MG TB24 2 po qd 01/04/16   Lelon Perla Chase, DO  sulfamethoxazole-trimethoprim (BACTRIM DS,SEPTRA DS) 800-160 MG tablet Take 1 tablet by mouth 2 (two) times daily. 02/21/16 03/02/16  Eustace Moore, MD    Family History Family History  Problem Relation Age of Onset  . Diabetes Mother   . Hyperlipidemia Mother   . Hypertension Mother   . Diabetes Father   . Hyperlipidemia Father   . Hypertension Father     Social History Social History  Substance Use Topics  . Smoking status: Never Smoker  . Smokeless tobacco: Never Used  . Alcohol use No     Allergies   Review of patient's allergies indicates no known allergies.   Review of Systems Review of Systems  All other systems reviewed and are negative.    Physical Exam Triage Vital Signs ED Triage Vitals  Enc Vitals Group     BP 02/21/16 1915 138/75     Pulse Rate 02/21/16 1915 100     Resp 02/21/16 1915 18     Temp 02/21/16 1915 98.9 F (37.2 C)     Temp Source 02/21/16 1915 Oral     SpO2 02/21/16 1915 99 %     Weight --      Height --      Pain Score 02/21/16 1917 6   Updated Vital  Signs BP 138/75 (BP Location: Left Arm)   Pulse 100   Temp 98.9 F (37.2 C) (Oral)   Resp 18   SpO2 99%  Physical Exam  Constitutional: She is oriented to person, place, and time. No distress.  Alert, nicely groomed  HENT:  Head: Atraumatic.  Bilateral TMs are dull, right greater than left Right TM is red tinged, left is without erythema Mild to moderate nasal congestion bilaterally Throat is pink  Eyes:  Conjugate gaze, no eye redness/drainage  Neck:  Neck supple.  Cardiovascular: Normal rate.   Pulmonary/Chest: No respiratory distress.  Abdominal: She exhibits no distension.  Musculoskeletal: Normal range of motion.  No leg swelling  Neurological: She is alert and oriented to person, place, and time.  Skin: Skin is warm and dry.  No cyanosis Left posterior heel with a 1 cm crusted area surrounded by erythema and vesiculation and a larger zone of pale erythema extending to about 5 cm across Tender to touch No fluctuance, no drainage expressible  Nursing note and vitals reviewed.    UC Treatments / Results   Procedures Procedures (including critical care time)      None today  Final Clinical Impressions(s) / UC Diagnoses   Final diagnoses:  Infected blister of heel, left, initial encounter  Vertigo   Wash heel wound gently with soap/water 1-2 x daily and apply antibiotic ointment/bandage. Prescriptions for bactrim and keflex [facility] [address]. Recheck or followup with primary care provider Seabron Spates for any increasing redness/swelling/pain/drainage or new fever >100.5.  New Prescriptions New Prescriptions   CEPHALEXIN (KEFLEX) 500 MG CAPSULE    Take 1 capsule (500 mg total) by mouth 2 (two) times daily.   FLUCONAZOLE (DIFLUCAN) 150 MG TABLET    Take 1 tablet (150 mg total) by mouth once. Repeat dose in 3d if needed.   SULFAMETHOXAZOLE-TRIMETHOPRIM (BACTRIM DS,SEPTRA DS) 800-160 MG TABLET    Take 1 tablet by mouth 2 (two) times daily.      Eustace Moore, MD 02/24/16 2233

## 2016-03-04 ENCOUNTER — Telehealth: Payer: Self-pay | Admitting: Family Medicine

## 2016-03-04 DIAGNOSIS — I10 Essential (primary) hypertension: Secondary | ICD-10-CM

## 2016-03-04 DIAGNOSIS — E785 Hyperlipidemia, unspecified: Secondary | ICD-10-CM

## 2016-03-04 MED ORDER — LOSARTAN POTASSIUM 100 MG PO TABS: 100.0000 mg | ORAL_TABLET | Freq: Every day | ORAL | 0 refills | Status: DC

## 2016-03-04 NOTE — Telephone Encounter (Signed)
Spoke with patient to inform patient her Rx was sent in to her local pharmacy. Patient no questions or concerns at this time.

## 2016-03-04 NOTE — Telephone Encounter (Signed)
Caller name: Relationship to patient: Self Can be reached: [phone] Pharmacy:  [id number] - [place], [place] - [address] [phone] (Phone) [phone] (Fax)     Reason for call: Request refill on losartan (COZAAR) 100 MG tablet [[id number]][[id number]]

## 2016-03-07 MED ORDER — FENOFIBRATE 160 MG PO TABS: 160.0000 mg | ORAL_TABLET | Freq: Every day | ORAL | 1 refills | Status: DC

## 2016-03-07 MED ORDER — LOSARTAN POTASSIUM 100 MG PO TABS: 100.0000 mg | ORAL_TABLET | Freq: Every day | ORAL | 1 refills | Status: DC

## 2016-03-07 NOTE — Addendum Note (Signed)
Addended by: Arnette NorrisPAYNE, Derricka Mertz P on: 03/07/2016 11:16 AM   Modules accepted: Orders

## 2016-03-07 NOTE — Telephone Encounter (Signed)
Pt says that she spoke with pharmacy and they stated that Rx has not being received for losartan. She now also need a refill on her Fenofibrate medication. Pt says that she will come by and pick up hard copy Rx if need be.    [phone]

## 2016-03-07 NOTE — Telephone Encounter (Signed)
Rx re-faxed    KP 

## 2016-03-16 ENCOUNTER — Ambulatory Visit: Payer: 59

## 2016-03-17 LAB — HM DIABETES EYE EXAM

## 2016-04-05 ENCOUNTER — Encounter: Payer: Self-pay | Admitting: Family Medicine

## 2016-05-20 ENCOUNTER — Other Ambulatory Visit: Payer: Self-pay | Admitting: Family Medicine

## 2016-05-20 DIAGNOSIS — I10 Essential (primary) hypertension: Secondary | ICD-10-CM

## 2016-05-20 DIAGNOSIS — E785 Hyperlipidemia, unspecified: Secondary | ICD-10-CM

## 2016-06-08 ENCOUNTER — Ambulatory Visit (INDEPENDENT_AMBULATORY_CARE_PROVIDER_SITE_OTHER): Payer: 59 | Admitting: Family Medicine

## 2016-06-08 ENCOUNTER — Encounter: Payer: Self-pay | Admitting: Family Medicine

## 2016-06-08 VITALS — BP 129/68 | HR 105 | Temp 97.9°F | Wt 158.8 lb

## 2016-06-08 DIAGNOSIS — J209 Acute bronchitis, unspecified: Secondary | ICD-10-CM

## 2016-06-08 MED ORDER — BENZONATATE 100 MG PO CAPS: 100.0000 mg | ORAL_CAPSULE | Freq: Two times a day (BID) | ORAL | 0 refills | Status: DC | PRN

## 2016-06-08 NOTE — Patient Instructions (Signed)
Continue to push fluids, practice good hand hygiene, and cover your mouth if you cough.  OK to take 1-2 Benadryl at night.  If you feel that you are breathing heavier, can take 4 puffs of Qvar twice daily instead of 2 puffs.

## 2016-06-08 NOTE — Progress Notes (Signed)
Pre visit review using our clinic review tool, if applicable. No additional management support is needed unless otherwise documented below in the visit note. 

## 2016-06-08 NOTE — Progress Notes (Signed)
Chief Complaint  Patient presents with  . Sinusitis    Congestion, Headaches and Cough. Blows out clear mucus.    Dominique ShengPatricia A [name] here for URI complaints.  Duration: 10 days  Associated symptoms: sinus congestion, rhinorrhea, sore throat, chest tightness and productive cough Denies: fever, rigors, current SOB, ear pain, sinus pain Treatment to date: Benadryl Sick contacts: Yes- daughter  ROS:  Const: Denies fevers HEENT: As noted in HPI Lungs: +cough  Past Medical History:  Diagnosis Date  . Arthritis    left foot  . Asthma   . Endometriosis   . GERD (gastroesophageal reflux disease)   . H/O hiatal hernia   . Histoplasmosis 2002   history of, [place]  . Hypertension   . Kidney stone    Family History  Problem Relation Age of Onset  . Diabetes Mother   . Hyperlipidemia Mother   . Hypertension Mother   . Diabetes Father   . Hyperlipidemia Father   . Hypertension Father     BP 129/68 (BP Location: Left Arm, Patient Position: Sitting, Cuff Size: Large)   Pulse (!) 105   Temp 97.9 F (36.6 C) (Oral)   Wt 158 lb 12.8 oz (72 kg)   SpO2 98% Comment: RA  BMI 28.13 kg/m  General: Awake, alert, appears stated age HEENT: AT, [place], ears patent b/l and TM's neg, nares patent w/o discharge, pharynx pink and without exudates, no sinus tenderness, MMM Neck: No masses or asymmetry Heart: RRR, no murmurs, no bruits Lungs: CTAB, no accessory muscle use; she did cough several times on deep inspiration Psych: Age appropriate judgment and insight, normal mood and affect  Acute bronchitis, unspecified organism - Plan: benzonatate (TESSALON) 100 MG capsule  Orders as above.  Discussed that she does not have any findings suggestive of a bacterial infection. As her lungs sound clear, could increase Qvar to 4 puffs twice daily if she feels her breathing is affected. Continue to push fluids, practice good hand hygiene, cover mouth when coughing. F/u in 1 week if symptoms  worsen or fail to improve. Pt voiced understanding and agreement to the plan.  Jilda Rocheicholas Paul PostvilleWendling, DO 06/08/16 9:51 AM

## 2016-06-17 ENCOUNTER — Other Ambulatory Visit: Payer: Self-pay | Admitting: Family Medicine

## 2016-06-17 DIAGNOSIS — IMO0002 Reserved for concepts with insufficient information to code with codable children: Secondary | ICD-10-CM

## 2016-06-17 DIAGNOSIS — J302 Other seasonal allergic rhinitis: Secondary | ICD-10-CM

## 2016-06-17 DIAGNOSIS — E1165 Type 2 diabetes mellitus with hyperglycemia: Secondary | ICD-10-CM

## 2016-06-17 DIAGNOSIS — E1151 Type 2 diabetes mellitus with diabetic peripheral angiopathy without gangrene: Secondary | ICD-10-CM

## 2016-07-12 ENCOUNTER — Other Ambulatory Visit: Payer: Self-pay | Admitting: Family Medicine

## 2016-07-12 DIAGNOSIS — I10 Essential (primary) hypertension: Secondary | ICD-10-CM

## 2016-07-13 ENCOUNTER — Other Ambulatory Visit: Payer: Self-pay | Admitting: *Deleted

## 2016-07-13 DIAGNOSIS — IMO0002 Reserved for concepts with insufficient information to code with codable children: Secondary | ICD-10-CM

## 2016-07-13 DIAGNOSIS — E785 Hyperlipidemia, unspecified: Secondary | ICD-10-CM

## 2016-07-13 DIAGNOSIS — E1165 Type 2 diabetes mellitus with hyperglycemia: Principal | ICD-10-CM

## 2016-07-13 DIAGNOSIS — E1151 Type 2 diabetes mellitus with diabetic peripheral angiopathy without gangrene: Secondary | ICD-10-CM

## 2016-07-13 DIAGNOSIS — J45909 Unspecified asthma, uncomplicated: Secondary | ICD-10-CM

## 2016-07-13 MED ORDER — ALBUTEROL SULFATE HFA 108 (90 BASE) MCG/ACT IN AERS: 2.0000 | INHALATION_SPRAY | Freq: Four times a day (QID) | RESPIRATORY_TRACT | 0 refills | Status: DC | PRN

## 2016-07-13 MED ORDER — FENOFIBRATE 160 MG PO TABS: 160.0000 mg | ORAL_TABLET | Freq: Every day | ORAL | 0 refills | Status: DC

## 2016-07-13 MED ORDER — ATORVASTATIN CALCIUM 10 MG PO TABS: 10.0000 mg | ORAL_TABLET | ORAL | 0 refills | Status: DC

## 2016-07-13 MED ORDER — NATEGLINIDE 60 MG PO TABS: ORAL_TABLET | ORAL | 0 refills | Status: DC

## 2016-08-11 ENCOUNTER — Encounter: Payer: Self-pay | Admitting: Medical

## 2016-08-11 ENCOUNTER — Ambulatory Visit (INDEPENDENT_AMBULATORY_CARE_PROVIDER_SITE_OTHER): Payer: 59 | Admitting: Medical

## 2016-08-11 VITALS — BP 124/71 | Temp 98.2°F | Resp 16 | Ht 63.0 in | Wt 158.0 lb

## 2016-08-11 DIAGNOSIS — J0100 Acute maxillary sinusitis, unspecified: Secondary | ICD-10-CM

## 2016-08-11 DIAGNOSIS — R05 Cough: Secondary | ICD-10-CM

## 2016-08-11 DIAGNOSIS — J45909 Unspecified asthma, uncomplicated: Secondary | ICD-10-CM

## 2016-08-11 DIAGNOSIS — J029 Acute pharyngitis, unspecified: Secondary | ICD-10-CM

## 2016-08-11 DIAGNOSIS — R059 Cough, unspecified: Secondary | ICD-10-CM

## 2016-08-11 DIAGNOSIS — E119 Type 2 diabetes mellitus without complications: Secondary | ICD-10-CM | POA: Diagnosis not present

## 2016-08-11 LAB — POCT RAPID STREP A (OFFICE): Rapid Strep A Screen: NEGATIVE

## 2016-08-11 LAB — POC INFLUENZA A&B (BINAX/QUICKVUE)
INFLUENZA A, POC: NEGATIVE
INFLUENZA B, POC: NEGATIVE

## 2016-08-11 MED ORDER — BENZONATATE 100 MG PO CAPS: 100.0000 mg | ORAL_CAPSULE | Freq: Three times a day (TID) | ORAL | 0 refills | Status: DC | PRN

## 2016-08-11 MED ORDER — AMOXICILLIN-POT CLAVULANATE 875-125 MG PO TABS: 1.0000 | ORAL_TABLET | Freq: Two times a day (BID) | ORAL | 0 refills | Status: DC

## 2016-08-11 MED ORDER — PREDNISONE 10 MG PO TABS: ORAL_TABLET | ORAL | 0 refills | Status: DC

## 2016-08-11 MED ORDER — FLUCONAZOLE 150 MG PO TABS: 150.0000 mg | ORAL_TABLET | Freq: Once | ORAL | 0 refills | Status: AC

## 2016-08-11 NOTE — Patient Instructions (Addendum)
You appear to have a sinus infection. I am prescribing augmentin antibiotic for the infection. To help with the nasal congestion use your nasal steroid. For your associated cough, I prescribed cough medicine benzonatate  For your wheezing recently continue your inhalers. If you have to use albuterol or xopenex very 6 hours then add prednisone taper dose.  Your a1c has been well controlled one year ago. Will get cmp and a1c today. Will let you know the results. Stay on diabetic meds and low sugar diet.   Rest, hydrate, tylenol for fever.  Follow up in 7 days or as needed.  Pt aware of potential effects of prednisone on sugar levels.

## 2016-08-11 NOTE — Progress Notes (Signed)
Subjective:    Patient ID: Dominique [name], female    DOB: 10/27/1960, 56 y.o.   MRN: [id number]  HPI  Pt in with just less than one week of nasal congestion and some sinus pressure. Pt feels some pnd. Some cough that is mostly productive for 3 days. Some wheezing. Pt convinced she is about to get flare of her asthma. She has used xopenex but not using repeatedly.   Pt leaving down next wed and will be driving on Wednesday. She does not want to be sick while out of town.  Pt sugars have been bs fasting 130 in am yeserday. Pt has diabetes. Sugars typically 130-150 fasting in am.  Pt is janumet and starlix.  One year ago. Her a1c was 6.4.  Pt has xopenix as neb solution. Also albuterol inhaler available. Pt has qvar. Advised to use qvar daily.  Pt never had hospitalized for asthma. But mentioned couple of time close to.     Review of Systems  Constitutional: Negative for chills, fatigue and fever.  HENT: Positive for congestion, postnasal drip, sinus pain and sinus pressure.   Respiratory: Positive for cough and wheezing. Negative for chest tightness and shortness of breath.   Cardiovascular: Negative for chest pain and palpitations.  Gastrointestinal: Negative for abdominal pain.  Musculoskeletal: Negative for back pain, myalgias and neck pain.  Neurological: Negative for dizziness and headaches.  Hematological: Negative for adenopathy.  Psychiatric/Behavioral: Negative for behavioral problems and confusion.     Past Medical History:  Diagnosis Date  . Arthritis    left foot  . Asthma   . Endometriosis   . GERD (gastroesophageal reflux disease)   . H/O hiatal hernia   . 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 Hypertension   . Kidney stone      Social History   Social History  . Marital status: Married    Spouse name: N/A  . Number of children: N/A  . Years of education: N/A   Occupational History  .      unemployed   Social History Main  Topics  . Smoking status: Never Smoker  . Smokeless tobacco: Never Used  . Alcohol use No  . Drug use: No  . Sexual activity: Yes    Partners: Male   Other Topics Concern  . Not on file   Social History Narrative   Exercise-- no    Past Surgical History:  Procedure Laterality Date  . laparatomy  1985  . RADIAL HEAD ARTHROPLASTY Left 02/23/2013   Procedure: LEFT RADIAL HEAD ARTHROPLASTY qwith ligament reconstruction;  Surgeon: Dominica Severin, MD;  Location: [facility];  Service: Orthopedics;  Laterality: Left;  . TONSILLECTOMY AND ADENOIDECTOMY  1967  . uterine lining ablation  2002    Family History  Problem Relation Age of Onset  . Diabetes Mother   . Hyperlipidemia Mother   . Hypertension Mother   . Diabetes Father   . Hyperlipidemia Father   . Hypertension Father     No Known Allergies  Current Outpatient Prescriptions on File Prior to Visit  Medication Sig Dispense Refill  . albuterol (VENTOLIN HFA) 108 (90 Base) MCG/ACT inhaler Inhale 2 puffs into the lungs every 6 (six) hours as needed for wheezing or shortness of breath. For shortness of breath 3 Inhaler 0  . Ascorbic Acid (VITAMIN C) 1000 MG tablet Take 1,000 mg by mouth daily.    [name] atorvastatin (LIPITOR) 10 MG tablet Take 1 tablet (10 mg  total) by mouth every other day. 45 tablet 0  . beclomethasone (QVAR) 40 MCG/ACT inhaler Inhale 2 puffs into the lungs 2 (two) times daily. 3 Inhaler 3  . fenofibrate 160 MG tablet Take 1 tablet (160 mg total) by mouth daily. 90 tablet 0  . fluticasone (VERAMYST) 27.5 MCG/SPRAY nasal spray Place 2 sprays into the nose daily. Pt failed fluticasone 30 g 3  . glucose blood test strip ONETOUCH VERIO-CHECK BLOOD SUGAR THREE TIMES A DAY 200 each 12  . JANUMET XR 50-1000 MG TB24 TAKE 2 TABLETS BY MOUTH  EVERY DAY 180 tablet 0  . levalbuterol (XOPENEX) 1.25 MG/3ML nebulizer solution Take 1.25 mg by nebulization every 6 (six) hours as needed for wheezing or shortness of breath. 3 ml nebs 72  mL 1  . losartan (COZAAR) 100 MG tablet TAKE 1 TABLET BY MOUTH  DAILY 90 tablet 0  . montelukast (SINGULAIR) 10 MG tablet TAKE 1 TABLET BY MOUTH AT  BEDTIME 90 tablet 0  . nateglinide (STARLIX) 60 MG tablet Take 1 tablet by mouth 3  times daily with meals 270 tablet 0  . calcium carbonate (OS-CAL - DOSED IN MG OF ELEMENTAL CALCIUM) 1250 MG tablet Take 1 tablet by mouth.    . cholecalciferol (VITAMIN D) 1000 UNITS tablet Take 1,000 Units by mouth daily.    . NONFORMULARY OR COMPOUNDED ITEM Cmp, lipid, hgba1c,  Dx dm II uncontrolled, hyperlipidemia, htn (Patient not taking: Reported on 08/11/2016) 1 each 0  . NONFORMULARY OR COMPOUNDED ITEM Hep C antibody--- dx need for hep c screening (Patient not taking: Reported on 08/11/2016) 1 each 0   No current facility-administered medications on file prior to visit.     BP 124/71 (BP Location: Left Arm, Patient Position: Sitting, Cuff Size: Large)   Temp 98.2 F (36.8 C) (Oral)   Resp 16   Ht 5\' 3"  (1.6 m)   Wt 158 lb (71.7 kg)   SpO2 98%   BMI 27.99 kg/m       Objective:   Physical Exam   General  Mental Status - Alert. General Appearance - Well groomed. Not in acute distress.  Skin Rashes- No Rashes.  HEENT Head- Normal. Ear Auditory Canal - Left- Normal. Right - Normal.Tympanic Membrane- Left- Normal. Right- Normal. Eye Sclera/Conjunctiva- Left- Normal. Right- Normal. Nose & Sinuses Nasal Mucosa- Left-  Boggy and Congested. Right-  Boggy and  Congested.Bilateral maxillary and frontal sinus pressure. Mouth & Throat Lips: Upper Lip- Normal: no dryness, cracking, pallor, cyanosis, or vesicular eruption. Lower Lip-Normal: no dryness, cracking, pallor, cyanosis or vesicular eruption. Buccal Mucosa- Bilateral- No Aphthous ulcers. Oropharynx- No Discharge or Erythema. Tonsils: Characteristics- Bilateral- No Erythema or Congestion. Size/Enlargement- Bilateral- No enlargement. Discharge- bilateral-None.  Neck Neck- Supple. No  Masses.   Chest and Lung Exam Auscultation: Breath Sounds:-Clear even and unlabored.(no wheezing heard presently)  Cardiovascular Auscultation:Rythm- Regular, rate and rhythm. Murmurs & Other Heart Sounds:Ausculatation of the heart reveal- No Murmurs.  Lymphatic Head & Neck General Head & Neck Lymphatics: Bilateral: Description- No Localized lymphadenopathy.     Assessment & Plan:  You appear to have a sinus infection. I am prescribing augmentin antibiotic for the infection. To help with the nasal congestion use your nasal steroid. For your associated cough, I prescribed cough medicine benzonatate  For your wheezing recently continue your inhalers. If you have to use albuterol or xopenex very 6 hours then add prednisone taper dose.  Your a1c has been well controlled one year ago. Will get  cmp and a1c today. Will let you know the results. Stay on diabetic meds and low sugar diet.   Rest, hydrate, tylenol for fever.  Follow up in 7 days or as needed.  Pt aware of potential effects of prednisone on sugar levels.  Daelyn Pettaway, Ramon DredgeEdward, PA-C

## 2016-08-11 NOTE — Progress Notes (Signed)
Pre visit review using our clinic review tool, if applicable. No additional management support is needed unless otherwise documented below in the visit note/SLS  

## 2016-08-26 ENCOUNTER — Other Ambulatory Visit: Payer: Self-pay | Admitting: *Deleted

## 2016-08-26 NOTE — Telephone Encounter (Signed)
Left message on machine for patient to call back and to make an appointment for follow up appointment for diabetes in 6 months.

## 2016-08-30 ENCOUNTER — Other Ambulatory Visit: Payer: Self-pay | Admitting: Family Medicine

## 2016-08-30 DIAGNOSIS — E1165 Type 2 diabetes mellitus with hyperglycemia: Principal | ICD-10-CM

## 2016-08-30 DIAGNOSIS — E1151 Type 2 diabetes mellitus with diabetic peripheral angiopathy without gangrene: Secondary | ICD-10-CM

## 2016-08-30 DIAGNOSIS — J302 Other seasonal allergic rhinitis: Secondary | ICD-10-CM

## 2016-08-30 DIAGNOSIS — IMO0002 Reserved for concepts with insufficient information to code with codable children: Secondary | ICD-10-CM

## 2016-10-03 ENCOUNTER — Other Ambulatory Visit: Payer: Self-pay | Admitting: Family Medicine

## 2016-10-03 DIAGNOSIS — E1151 Type 2 diabetes mellitus with diabetic peripheral angiopathy without gangrene: Secondary | ICD-10-CM

## 2016-10-03 DIAGNOSIS — IMO0002 Reserved for concepts with insufficient information to code with codable children: Secondary | ICD-10-CM

## 2016-10-03 DIAGNOSIS — E785 Hyperlipidemia, unspecified: Secondary | ICD-10-CM

## 2016-10-03 DIAGNOSIS — E1165 Type 2 diabetes mellitus with hyperglycemia: Secondary | ICD-10-CM

## 2016-10-03 MED ORDER — NATEGLINIDE 60 MG PO TABS: ORAL_TABLET | ORAL | 0 refills | Status: DC

## 2016-10-03 MED ORDER — ATORVASTATIN CALCIUM 10 MG PO TABS: 10.0000 mg | ORAL_TABLET | ORAL | 0 refills | Status: DC

## 2016-10-03 MED ORDER — FENOFIBRATE 160 MG PO TABS: 160.0000 mg | ORAL_TABLET | Freq: Every day | ORAL | 0 refills | Status: DC

## 2016-11-10 ENCOUNTER — Ambulatory Visit (INDEPENDENT_AMBULATORY_CARE_PROVIDER_SITE_OTHER): Payer: 59 | Admitting: Family Medicine

## 2016-11-10 VITALS — BP 130/82 | HR 88 | Temp 98.8°F | Ht 63.0 in | Wt 159.0 lb

## 2016-11-10 DIAGNOSIS — R21 Rash and other nonspecific skin eruption: Secondary | ICD-10-CM | POA: Diagnosis not present

## 2016-11-10 DIAGNOSIS — J45909 Unspecified asthma, uncomplicated: Secondary | ICD-10-CM

## 2016-11-10 MED ORDER — LEVALBUTEROL HCL 1.25 MG/3ML IN NEBU: 1.2500 mg | INHALATION_SOLUTION | Freq: Four times a day (QID) | RESPIRATORY_TRACT | 1 refills | Status: DC | PRN

## 2016-11-10 MED ORDER — BECLOMETHASONE DIPROPIONATE 40 MCG/ACT IN AERS: 2.0000 | INHALATION_SPRAY | Freq: Two times a day (BID) | RESPIRATORY_TRACT | 3 refills | Status: DC

## 2016-11-10 MED ORDER — FLUTICASONE FUROATE 27.5 MCG/SPRAY NA SUSP: 2.0000 | Freq: Every day | NASAL | 3 refills | Status: DC

## 2016-11-10 MED ORDER — CLOTRIMAZOLE-BETAMETHASONE 1-0.05 % EX CREA: 1.0000 "application " | TOPICAL_CREAM | Freq: Two times a day (BID) | CUTANEOUS | 0 refills | Status: DC

## 2016-11-10 MED ORDER — ALBUTEROL SULFATE HFA 108 (90 BASE) MCG/ACT IN AERS: 2.0000 | INHALATION_SPRAY | Freq: Four times a day (QID) | RESPIRATORY_TRACT | 0 refills | Status: DC | PRN

## 2016-11-10 NOTE — Patient Instructions (Signed)
Rash A rash is a change in the color of the skin. A rash can also change the way your skin feels. There are many different conditions and factors that can cause a rash. Follow these instructions at home: Pay attention to any changes in your symptoms. Follow these instructions to help with your condition: Medicine Take or apply over-the-counter and prescription medicines only as told by your health care provider. These may include:  Corticosteroid cream.  Anti-itch lotions.  Oral antihistamines.  Skin Care  Apply cool compresses to the affected areas.  Try taking a bath with: ? Epsom salts. Follow the instructions on the packaging. You can get these at your local pharmacy or grocery store. ? Baking soda. Pour a small amount into the bath as told by your health care provider. ? Colloidal oatmeal. Follow the instructions on the packaging. You can get this at your local pharmacy or grocery store.  Try applying baking soda paste to your skin. Stir water into baking soda until it reaches a paste-like consistency.  Do not scratch or rub your skin.  Avoid covering the rash. Make sure the rash is exposed to air as much as possible. General instructions  Avoid hot showers or baths, which can make itching worse. A cold shower may help.  Avoid scented soaps, detergents, and perfumes. Use gentle soaps, detergents, perfumes, and other cosmetic products.  Avoid any substance that causes your rash. Keep a journal to help track what causes your rash. Write down: ? What you eat. ? What cosmetic products you use. ? What you drink. ? What you wear. This includes jewelry.  Keep all follow-up visits as told by your health care provider. This is important. Contact a health care provider if:  You sweat at night.  You lose weight.  You urinate more than normal.  You feel weak.  You vomit.  Your skin or the whites of your eyes look yellow (jaundice).  Your skin: ? Tingles. ? Is  numb.  Your rash: ? Does not go away after several days. ? Gets worse.  You are: ? Unusually thirsty. ? More tired than normal.  You have: ? New symptoms. ? Pain in your abdomen. ? A fever. ? Diarrhea. Get help right away if:  You develop a rash that covers all or most of your body. The rash may or may not be painful.  You develop blisters that: ? Are on top of the rash. ? Grow larger or grow together. ? Are painful. ? Are inside your nose or mouth.  You develop a rash that: ? Looks like purple pinprick-sized spots all over your body. ? Has a "bull's eye" or looks like a target. ? Is not related to sun exposure, is red and painful, and causes your skin to peel. This information is not intended to replace advice given to you by your health care provider. Make sure you discuss any questions you have with your health care provider. Document Released: 04/22/2002 Document Revised: 10/06/2015 Document Reviewed: 09/17/2014 Elsevier Interactive Patient Education  2017 [employer]  

## 2016-11-10 NOTE — Progress Notes (Signed)
Patient ID: Dominique [name], female    DOB: 04-Apr-1961  Age: 56 y.o. MRN: [id number]    Subjective:  Subjective  HPI Dominique [name] presents for  Rash in axilla b/l.  No new soaps , detergents , lotions etc.    Review of Systems  Constitutional: Negative for appetite change, diaphoresis, fatigue and unexpected weight change.  Eyes: Negative for pain, redness and visual disturbance.  Respiratory: Negative for cough, chest tightness, shortness of breath and wheezing.   Cardiovascular: Negative for chest pain, palpitations and leg swelling.  Endocrine: Negative for cold intolerance, heat intolerance, polydipsia, polyphagia and polyuria.  Genitourinary: Negative for difficulty urinating, dysuria and frequency.  Skin: Positive for rash. Negative for wound.  Neurological: Negative for dizziness, light-headedness, numbness and headaches.    History Past Medical History:  Diagnosis Date  . Arthritis    left foot  . Asthma   . Endometriosis   . GERD (gastroesophageal reflux disease)   . H/O hiatal hernia   . 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 Hypertension   . Kidney stone     She has a past surgical history that includes Tonsillectomy and adenoidectomy (1967); uterine lining ablation (2002); laparatomy (1985); and Radial head arthroplasty (Left, 02/23/2013).   Her family history includes Diabetes in her father and mother; Hyperlipidemia in her father and mother; Hypertension in her father and mother.She reports that she has never smoked. She has never used smokeless tobacco. She reports that she does not drink alcohol or use drugs.  Current Outpatient Prescriptions on File Prior to Visit  Medication Sig Dispense Refill  . Ascorbic Acid (VITAMIN C) 1000 MG tablet Take 1,000 mg by mouth daily.    [name] atorvastatin (LIPITOR) 10 MG tablet Take 1 tablet (10 mg total) by mouth every other day. 45 tablet 0  . benzonatate (TESSALON) 100 MG capsule Take 1 capsule (100 mg  total) by mouth 3 (three) times daily as needed for cough. 21 capsule 0  . calcium carbonate (OS-CAL - DOSED IN MG OF ELEMENTAL CALCIUM) 1250 MG tablet Take 1 tablet by mouth.    . cholecalciferol (VITAMIN D) 1000 UNITS tablet Take 1,000 Units by mouth daily.    . fenofibrate 160 MG tablet Take 1 tablet (160 mg total) by mouth daily. 90 tablet 0  . glucose blood test strip ONETOUCH VERIO-CHECK BLOOD SUGAR THREE TIMES A DAY 200 each 12  . JANUMET XR 50-1000 MG TB24 TAKE 2 TABLETS BY MOUTH  EVERY DAY 180 tablet 1  . losartan (COZAAR) 100 MG tablet TAKE 1 TABLET BY MOUTH  DAILY 90 tablet 0  . montelukast (SINGULAIR) 10 MG tablet TAKE 1 TABLET BY MOUTH AT  BEDTIME 90 tablet 1  . nateglinide (STARLIX) 60 MG tablet Take 1 tablet by mouth 3  times daily with meals 270 tablet 0  . NONFORMULARY OR COMPOUNDED ITEM Cmp, lipid, hgba1c,  Dx dm II uncontrolled, hyperlipidemia, htn 1 each 0  . NONFORMULARY OR COMPOUNDED ITEM Hep C antibody--- dx need for hep c screening 1 each 0  . predniSONE (DELTASONE) 10 MG tablet 5 TAB PO DAY 1 4 TAB PO DAY 2 3 TAB PO DAY 3 2 TAB PO DAY 4 1 TAB PO DAY 5 15 tablet 0   No current facility-administered medications on file prior to visit.      Objective:  Objective  Physical Exam  Constitutional: She is oriented to person, place, and time. She appears well-developed and well-nourished.  HENT:  Head: Normocephalic and atraumatic.  Eyes: Conjunctivae and EOM are normal.  Neck: Normal range of motion. Neck supple. No JVD present. Carotid bruit is not present. No thyromegaly present.  Cardiovascular: Normal rate, regular rhythm and normal heart sounds.   No murmur heard. Pulmonary/Chest: Effort normal and breath sounds normal. No respiratory distress. She has no wheezes. She has no rales. She exhibits no tenderness.  Musculoskeletal: She exhibits no edema.  Neurological: She is alert and oriented to person, place, and time.  Skin: Rash noted. Rash is maculopapular.  There is erythema.     Psychiatric: She has a normal mood and affect.  Nursing note and vitals reviewed.  BP 130/82   Pulse 88   Temp 98.8 F (37.1 C) (Oral)   Ht 5\' 3"  (1.6 m)   Wt 159 lb (72.1 kg)   SpO2 98%   BMI 28.17 kg/m  Wt Readings from Last 3 Encounters:  11/10/16 159 lb (72.1 kg)  08/11/16 158 lb (71.7 kg)  06/08/16 158 lb 12.8 oz (72 kg)     Lab Results  Component Value Date   WBC 4.9 03/24/2014   HGB 16.5 (A) 03/24/2014   HCT 48 (A) 03/24/2014   PLT 166 03/24/2014   GLUCOSE 211 (H) 04/03/2013   CHOL 153 03/30/2015   TRIG 248 03/30/2015   HDL 42 03/30/2015   LDLCALC 61 03/30/2015   ALT 52 03/30/2015   AST 37 03/30/2015   NA 137 03/24/2014   K 4.3 03/24/2014   CL 100 04/03/2013   CREATININE 0.9 12/09/2014   BUN 14 03/24/2014   CO2 29 04/03/2013   TSH 1.56 03/24/2014   HGBA1C 6.4 (A) 12/09/2014    No results found.   Assessment & Plan:  Plan  I have discontinued Ms. Flott's amoxicillin-clavulanate. I am also having her start on clotrimazole-betamethasone. Additionally, I am having her maintain her vitamin C, cholecalciferol, calcium carbonate, glucose blood, NONFORMULARY OR COMPOUNDED ITEM, NONFORMULARY OR COMPOUNDED ITEM, losartan, benzonatate, predniSONE, JANUMET XR, montelukast, atorvastatin, fenofibrate, nateglinide, albuterol, beclomethasone, fluticasone, levalbuterol, and albuterol.  Meds ordered this encounter  Medications  . DISCONTD: albuterol (VENTOLIN HFA) 108 (90 Base) MCG/ACT inhaler    Sig: Inhale 2 puffs into the lungs every 6 (six) hours as needed for wheezing or shortness of breath. For shortness of breath    Dispense:  3 Inhaler    Refill:  0    Needs office visit before any more refills  . DISCONTD: beclomethasone (QVAR) 40 MCG/ACT inhaler    Sig: Inhale 2 puffs into the lungs 2 (two) times daily.    Dispense:  3 Inhaler    Refill:  3  . DISCONTD: fluticasone (VERAMYST) 27.5 MCG/SPRAY nasal spray    Sig: Place 2 sprays into  the nose daily. Pt failed fluticasone    Dispense:  30 g    Refill:  3  . DISCONTD: levalbuterol (XOPENEX) 1.25 MG/3ML nebulizer solution    Sig: Take 1.25 mg by nebulization every 6 (six) hours as needed for wheezing or shortness of breath. 3 ml nebs    Dispense:  72 mL    Refill:  1  . clotrimazole-betamethasone (LOTRISONE) cream    Sig: Apply 1 application topically 2 (two) times daily.    Dispense:  30 g    Refill:  0  . albuterol (VENTOLIN HFA) 108 (90 Base) MCG/ACT inhaler    Sig: Inhale 2 puffs into the lungs every 6 (six) hours as needed for wheezing or shortness  of breath. For shortness of breath    Dispense:  3 Inhaler    Refill:  0    Needs office visit before any more refills  . beclomethasone (QVAR) 40 MCG/ACT inhaler    Sig: Inhale 2 puffs into the lungs 2 (two) times daily.    Dispense:  3 Inhaler    Refill:  3  . fluticasone (VERAMYST) 27.5 MCG/SPRAY nasal spray    Sig: Place 2 sprays into the nose daily. Pt failed fluticasone    Dispense:  30 g    Refill:  3  . levalbuterol (XOPENEX) 1.25 MG/3ML nebulizer solution    Sig: Take 1.25 mg by nebulization every 6 (six) hours as needed for wheezing or shortness of breath. 3 ml nebs    Dispense:  72 mL    Refill:  1  . albuterol (VENTOLIN HFA) 108 (90 Base) MCG/ACT inhaler    Sig: Inhale 2 puffs into the lungs every 6 (six) hours as needed for wheezing or shortness of breath. For shortness of breath    Dispense:  3 Inhaler    Refill:  0    Problem List Items Addressed This Visit    None    Visit Diagnoses    Rash and nonspecific skin eruption    -  Primary   Relevant Medications   clotrimazole-betamethasone (LOTRISONE) cream   Uncomplicated asthma, unspecified asthma severity, unspecified whether persistent       Relevant Medications   albuterol (VENTOLIN HFA) 108 (90 Base) MCG/ACT inhaler   beclomethasone (QVAR) 40 MCG/ACT inhaler   levalbuterol (XOPENEX) 1.25 MG/3ML nebulizer solution   albuterol (VENTOLIN  HFA) 108 (90 Base) MCG/ACT inhaler   Asthma, chronic, unspecified asthma severity, uncomplicated       Relevant Medications   albuterol (VENTOLIN HFA) 108 (90 Base) MCG/ACT inhaler   beclomethasone (QVAR) 40 MCG/ACT inhaler   fluticasone (VERAMYST) 27.5 MCG/SPRAY nasal spray   levalbuterol (XOPENEX) 1.25 MG/3ML nebulizer solution   albuterol (VENTOLIN HFA) 108 (90 Base) MCG/ACT inhaler      Follow-up: Return if symptoms worsen or fail to improve.  Donato SchultzYvonne R Lowne Chase, DO

## 2016-11-11 ENCOUNTER — Encounter: Payer: Self-pay | Admitting: Family Medicine

## 2017-01-14 ENCOUNTER — Other Ambulatory Visit: Payer: Self-pay | Admitting: Family Medicine

## 2017-01-14 DIAGNOSIS — I10 Essential (primary) hypertension: Secondary | ICD-10-CM

## 2017-01-14 DIAGNOSIS — E785 Hyperlipidemia, unspecified: Secondary | ICD-10-CM

## 2017-01-18 NOTE — Telephone Encounter (Signed)
Relation to ZO:XWRUpt:self Call back number:[phone] [facility]  [facility] [place] [id number] [place] [phone] (Phone) [phone] (Fax)     Reason for call:  Patient checking on the status of

## 2017-01-19 NOTE — Telephone Encounter (Signed)
Last refill stated pt needs appt before next refill/has not been seen since June/thx dmf

## 2017-01-21 ENCOUNTER — Other Ambulatory Visit: Payer: Self-pay | Admitting: Family Medicine

## 2017-01-21 DIAGNOSIS — E785 Hyperlipidemia, unspecified: Secondary | ICD-10-CM

## 2017-01-25 ENCOUNTER — Other Ambulatory Visit: Payer: Self-pay

## 2017-01-25 ENCOUNTER — Telehealth: Payer: Self-pay | Admitting: Family Medicine

## 2017-01-25 DIAGNOSIS — E785 Hyperlipidemia, unspecified: Secondary | ICD-10-CM

## 2017-01-25 DIAGNOSIS — I10 Essential (primary) hypertension: Secondary | ICD-10-CM

## 2017-01-25 MED ORDER — LOSARTAN POTASSIUM 100 MG PO TABS: 100.0000 mg | ORAL_TABLET | Freq: Every day | ORAL | 0 refills | Status: DC

## 2017-01-25 MED ORDER — ATORVASTATIN CALCIUM 10 MG PO TABS: 10.0000 mg | ORAL_TABLET | ORAL | 0 refills | Status: DC
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
## 2017-01-25 NOTE — Telephone Encounter (Signed)
Received paper refill request for pt's Lipitor. Just want to assure ok to refill.   Last filled 10/03/16 qty:45 Rf:0 Last ov:11/10/16 no blood work done at visit   Pharmacy: [facility]

## 2017-01-25 NOTE — Progress Notes (Unsigned)
Received paper refill request for Losartan. Per last ov note pt is to continue. Refills sent

## 2017-01-25 NOTE — Telephone Encounter (Signed)
Refill x1 Pt due for labs

## 2017-01-25 NOTE — Telephone Encounter (Signed)
Nothing further is needed. 

## 2017-01-26 ENCOUNTER — Other Ambulatory Visit: Payer: Self-pay | Admitting: Family Medicine

## 2017-01-26 DIAGNOSIS — I10 Essential (primary) hypertension: Secondary | ICD-10-CM

## 2017-01-26 DIAGNOSIS — IMO0002 Reserved for concepts with insufficient information to code with codable children: Secondary | ICD-10-CM

## 2017-01-26 DIAGNOSIS — E1165 Type 2 diabetes mellitus with hyperglycemia: Secondary | ICD-10-CM

## 2017-01-26 DIAGNOSIS — E1151 Type 2 diabetes mellitus with diabetic peripheral angiopathy without gangrene: Secondary | ICD-10-CM

## 2017-01-26 DIAGNOSIS — E785 Hyperlipidemia, unspecified: Secondary | ICD-10-CM

## 2017-01-26 NOTE — Telephone Encounter (Signed)
These were faxed with note for pt to sched OV on 9.12.18/thx dmf

## 2017-02-07 ENCOUNTER — Telehealth: Payer: Self-pay | Admitting: Family Medicine

## 2017-02-07 ENCOUNTER — Encounter: Payer: Self-pay | Admitting: Family Medicine

## 2017-02-07 ENCOUNTER — Ambulatory Visit (INDEPENDENT_AMBULATORY_CARE_PROVIDER_SITE_OTHER): Payer: 59 | Admitting: Family Medicine

## 2017-02-07 VITALS — BP 134/80 | HR 93 | Temp 98.7°F | Ht 63.0 in | Wt 157.6 lb

## 2017-02-07 DIAGNOSIS — E118 Type 2 diabetes mellitus with unspecified complications: Secondary | ICD-10-CM

## 2017-02-07 DIAGNOSIS — E1165 Type 2 diabetes mellitus with hyperglycemia: Secondary | ICD-10-CM

## 2017-02-07 DIAGNOSIS — G510 Bell's palsy: Secondary | ICD-10-CM

## 2017-02-07 DIAGNOSIS — IMO0002 Reserved for concepts with insufficient information to code with codable children: Secondary | ICD-10-CM

## 2017-02-07 MED ORDER — PREDNISONE 20 MG PO TABS: 60.0000 mg | ORAL_TABLET | Freq: Every day | ORAL | 0 refills | Status: DC

## 2017-02-07 MED ORDER — VALACYCLOVIR HCL 1 G PO TABS: 1000.0000 mg | ORAL_TABLET | Freq: Three times a day (TID) | ORAL | 0 refills | Status: DC

## 2017-02-07 NOTE — Assessment & Plan Note (Signed)
Reports the most recent A1c was 7.4. We will use prednisone for her recurrent Bell's palsy. - Advised to monitor blood sugars and may need to adjust her medication regimen based on her readings.

## 2017-02-07 NOTE — Telephone Encounter (Signed)
[facility] Day - Client TELEPHONE ADVICE RECORD [facility] Patient Name: Dominique [name] DOB: [birth year] Initial Comment Caller states three years ago she had bells palsy. This morning she woke up this morning, and her eye brows are uneven, mouth feels like a bee sting, hard to talk. Nurse Assessment Nurse: Debera Lat, RN, Tinnie Gens Date/Time Lamount Cohen Time): 02/07/2017 10:45:14 AM Confirm and document reason for call. If symptomatic, describe symptoms. ---Caller states three years ago she had bells palsy. This morning she woke up this morning, and her eye brows are uneven, mouth feels like a bee sting, hard to talk. Symptoms started this morning. Does the patient have any new or worsening symptoms? ---Yes Will a triage be completed? ---Yes Related visit to physician within the last 2 weeks? ---No Does the PT have any chronic conditions? (i.e. diabetes, asthma, etc.) ---Yes List chronic conditions. ---Hx of Bell's palsy, HTN, DDM 2 Is this a behavioral health or substance abuse call? ---No Guidelines Guideline Title Affirmed Question Affirmed Notes Neurologic Deficit Bell's palsy suspected (i.e., weakness on only one side of the face, developing over hours to days, no other symptoms) Final Disposition User Go to ED Now (or PCP triage) Debera Lat, RN, Tinnie Gens Comments Caller did not want to go to ED and wanted to be seen in the office.  Office contacted about ED refusal. Referrals GO TO FACILITY REFUSED Caller Disagree/Comply Disagree Caller Understands Yes PreDisposition Call Doctor

## 2017-02-07 NOTE — Telephone Encounter (Signed)
Pt seeing Jordan Likes at 2.

## 2017-02-07 NOTE — Telephone Encounter (Signed)
Ok great.

## 2017-02-07 NOTE — Assessment & Plan Note (Addendum)
This seems to be about the fourth time that she has had this happen. She has had no neuro imaging. She reports no complete resolution of her symptoms. Discussed the possibility of neurology referral and MRI. - Prednisone and Valtrex  - She will monitor her blood sugar. - Given indications to return or call.

## 2017-02-07 NOTE — Progress Notes (Signed)
Dominique [name] - 56 y.o. female MRN [id number]  Date of birth: 23-Jun-1960  SUBJECTIVE:  Including CC & ROS.  Chief Complaint  Patient presents with  . Facial Droop    Patient is here today C/O a possible relapse of Bell's Palsey.  She states that she had it 3-years-ago and never quite recovered from it but woke this morning with the right side of her face drooping.  Her lip feels like something stings in it.    Dominique [name] is a 56 year old female that is presenting with recurrent Bell's palsy. She reports her symptoms started this morning. She is having some stinging sensation on the right side of her face. She has incomplete eyelid closure and numbness within the inside of her mouth. She has had this occur to previous times. She's never had imaging performed. She reports significant claustrophobia and is adverse to getting an MRI completed. She is diabetic. She has been under more stress lately and has not been sleeping well. She feels like her symptoms never went back to normal after the first time she had Bell's palsy. She denies any recent illnesses. She denies any bug bites or tick exposure.  Review of her A1c from 12/09/14 so 6.4.She gets her blood work completed at [facility] and the most recent A1c was 7.4.  She has been seen by neurology back in 2015. Reports that the first time she had an incident like this was in November 2014. At that time they had recommended an MRI. They also prescribed gabapentin for her mouth discomfort.  Review of Systems  Constitutional: Negative for fever.  HENT: Positive for facial swelling. Negative for trouble swallowing.   Eyes: Negative for pain.  Musculoskeletal: Negative for gait problem.  Skin: Negative for color change.  Neurological: Positive for facial asymmetry and weakness.  Hematological: Negative for adenopathy.  Psychiatric/Behavioral: Negative for agitation.    HISTORY: Past Medical, Surgical, Social, and Family History Reviewed &  Updated per EMR.   Pertinent Historical Findings include:  Past Medical History:  Diagnosis Date  . Arthritis    left foot  . Asthma   . Endometriosis   . GERD (gastroesophageal reflux disease)   . H/O hiatal hernia   . 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 Hypertension   . Kidney stone     Past Surgical History:  Procedure Laterality Date  . laparatomy  1985  . RADIAL HEAD ARTHROPLASTY Left 02/23/2013   Procedure: LEFT RADIAL HEAD ARTHROPLASTY qwith ligament reconstruction;  Surgeon: Dominique Severin, MD;  Location: [facility] OR;  Service: Orthopedics;  Laterality: Left;  . TONSILLECTOMY AND ADENOIDECTOMY  1967  . uterine lining ablation  2002    No Known Allergies  Family History  Problem Relation Age of Onset  . Diabetes Mother   . Hyperlipidemia Mother   . Hypertension Mother   . Diabetes Father   . Hyperlipidemia Father   . Hypertension Father      Social History   Social History  . Marital status: Married    Spouse name: N/A  . Number of children: N/A  . Years of education: N/A   Occupational History  .      unemployed   Social History Main Topics  . Smoking status: Never Smoker  . Smokeless tobacco: Never Used  . Alcohol use No  . Drug use: No  . Sexual activity: Yes    Partners: Male   Other Topics Concern  . Not on file  Social History Narrative   Exercise-- no     PHYSICAL EXAM:  VS: BP 134/80 (BP Location: Left Arm, Patient Position: Sitting, Cuff Size: Normal)   Pulse 93   Temp 98.7 F (37.1 C) (Oral)   Ht  (1.6 m)   Wt 157 lb 9.6 oz (71.5 kg)   SpO2 98%   BMI 27.92 kg/m  Physical Exam Gen: NAD, alert, cooperative with exam, ENT: normal lips, normal nasal mucosa,  Eye: normal EOM, normal conjunctiva and lids CV:  no edema, +2 pedal pulses   Resp: no accessory muscle use, non-labored,  GI: no masses or tenderness, no hernia  Skin: no rashes, no areas of induration  Neuro: Asymmetric facial fissures on the  right, unable to make a smile, unable to close her right eyelid completely, sensation still intact, pupils reactive, extraocular movements intact, normal shrug strength to resistance, normal sensation to touch Psych:  normal insight, alert and oriented, normal shoulder abduction and flexion, normal strength in upper extremity is bilaterally, normal sensation in upper extremity is bilaterally, normal pincer grasp, normal grip strength MSK: Normal gait, normal strength      ASSESSMENT & PLAN:   Bell's palsy This seems to be about the fourth time that she has had this happen. She has had no neuro imaging. She reports no complete resolution of her symptoms. Discussed the possibility of neurology referral and MRI. - Prednisone and Valtrex  - She will monitor her blood sugar. - Given indications to return or call.   Diabetes mellitus type II, uncontrolled Reports the most recent A1c was 7.4. We will use prednisone for her recurrent Bell's palsy. - Advised to monitor blood sugars and may need to adjust her medication regimen based on her readings.

## 2017-02-07 NOTE — Patient Instructions (Signed)
Thank you for coming in,   Please let me know if you would like to get an MRI.   Please let me know if you would like to see a Neurologist.   Please use artificial tears for your eyes.   Please let me know if your sugars are running high and we may need to make some additions to your regimen.     Please feel free to call with any questions or concerns at any time, at [phone]. --Dr. Jordan Likes

## 2017-02-07 NOTE — Telephone Encounter (Signed)
She needs ov today--- if no one has anythiing she needs to go to ER

## 2017-03-01 DIAGNOSIS — M2012 Hallux valgus (acquired), left foot: Secondary | ICD-10-CM | POA: Diagnosis not present

## 2017-03-01 DIAGNOSIS — M2022 Hallux rigidus, left foot: Secondary | ICD-10-CM | POA: Diagnosis not present

## 2017-03-01 DIAGNOSIS — M659 Synovitis and tenosynovitis, unspecified: Secondary | ICD-10-CM | POA: Diagnosis not present

## 2017-03-01 DIAGNOSIS — M2011 Hallux valgus (acquired), right foot: Secondary | ICD-10-CM | POA: Diagnosis not present

## 2017-03-06 ENCOUNTER — Ambulatory Visit (INDEPENDENT_AMBULATORY_CARE_PROVIDER_SITE_OTHER): Payer: 59 | Admitting: Family Medicine

## 2017-03-06 ENCOUNTER — Encounter: Payer: Self-pay | Admitting: Family Medicine

## 2017-03-06 VITALS — BP 128/78 | HR 80 | Temp 98.7°F | Ht 65.0 in | Wt 156.0 lb

## 2017-03-06 DIAGNOSIS — I10 Essential (primary) hypertension: Secondary | ICD-10-CM | POA: Diagnosis not present

## 2017-03-06 DIAGNOSIS — IMO0002 Reserved for concepts with insufficient information to code with codable children: Secondary | ICD-10-CM

## 2017-03-06 DIAGNOSIS — E785 Hyperlipidemia, unspecified: Secondary | ICD-10-CM | POA: Diagnosis not present

## 2017-03-06 DIAGNOSIS — E1165 Type 2 diabetes mellitus with hyperglycemia: Secondary | ICD-10-CM | POA: Diagnosis not present

## 2017-03-06 DIAGNOSIS — J302 Other seasonal allergic rhinitis: Secondary | ICD-10-CM | POA: Diagnosis not present

## 2017-03-06 DIAGNOSIS — E1151 Type 2 diabetes mellitus with diabetic peripheral angiopathy without gangrene: Secondary | ICD-10-CM

## 2017-03-06 DIAGNOSIS — G510 Bell's palsy: Secondary | ICD-10-CM | POA: Diagnosis not present

## 2017-03-06 DIAGNOSIS — Z23 Encounter for immunization: Secondary | ICD-10-CM | POA: Diagnosis not present

## 2017-03-06 DIAGNOSIS — J45909 Unspecified asthma, uncomplicated: Secondary | ICD-10-CM | POA: Diagnosis not present

## 2017-03-06 MED ORDER — ALBUTEROL SULFATE HFA 108 (90 BASE) MCG/ACT IN AERS: 2.0000 | INHALATION_SPRAY | Freq: Four times a day (QID) | RESPIRATORY_TRACT | 2 refills | Status: DC | PRN

## 2017-03-06 MED ORDER — LOSARTAN POTASSIUM 100 MG PO TABS: 100.0000 mg | ORAL_TABLET | Freq: Every day | ORAL | 1 refills | Status: DC

## 2017-03-06 MED ORDER — FENOFIBRATE 160 MG PO TABS: 160.0000 mg | ORAL_TABLET | Freq: Every day | ORAL | 1 refills | Status: DC

## 2017-03-06 MED ORDER — MONTELUKAST SODIUM 10 MG PO TABS: 10.0000 mg | ORAL_TABLET | Freq: Every day | ORAL | 1 refills | Status: DC

## 2017-03-06 MED ORDER — NATEGLINIDE 60 MG PO TABS: ORAL_TABLET | ORAL | 1 refills | Status: DC

## 2017-03-06 MED ORDER — ATORVASTATIN CALCIUM 10 MG PO TABS: 10.0000 mg | ORAL_TABLET | ORAL | 2 refills | Status: DC

## 2017-03-06 MED ORDER — FLUTICASONE FUROATE 27.5 MCG/SPRAY NA SUSP: 2.0000 | Freq: Every day | NASAL | 3 refills | Status: DC

## 2017-03-06 MED ORDER — BECLOMETHASONE DIPROPIONATE 40 MCG/ACT IN AERS: 2.0000 | INHALATION_SPRAY | Freq: Two times a day (BID) | RESPIRATORY_TRACT | 3 refills | Status: DC

## 2017-03-06 MED ORDER — DULAGLUTIDE 0.75 MG/0.5ML ~~LOC~~ SOAJ: SUBCUTANEOUS | 2 refills | Status: DC

## 2017-03-06 MED ORDER — LEVALBUTEROL HCL 1.25 MG/3ML IN NEBU: 1.2500 mg | INHALATION_SOLUTION | Freq: Four times a day (QID) | RESPIRATORY_TRACT | 1 refills | Status: DC | PRN

## 2017-03-06 NOTE — Assessment & Plan Note (Signed)
Tolerating statin, encouraged heart healthy diet, avoid trans fats, minimize simple carbs and saturated fats. Increase exercise as tolerated 

## 2017-03-06 NOTE — Progress Notes (Signed)
PRINT Rx

## 2017-03-06 NOTE — Progress Notes (Signed)
Patient ID: Dominique [name], female    DOB: 1960/07/19  Age: 56 y.o. MRN: [id number]    Subjective:  Subjective  HPI Dominique [name] presents for f/u diabetes , htn, and cholesterol.  She was also just tx for her 3 episode bells palsy.     Review of Systems  Constitutional: Negative for appetite change, diaphoresis, fatigue and unexpected weight change.  Eyes: Negative for pain, redness and visual disturbance.  Respiratory: Negative for cough, chest tightness, shortness of breath and wheezing.   Cardiovascular: Negative for chest pain, palpitations and leg swelling.  Endocrine: Negative for cold intolerance, heat intolerance, polydipsia, polyphagia and polyuria.  Genitourinary: Negative for difficulty urinating, dysuria and frequency.  Neurological: Negative for dizziness, light-headedness, numbness and headaches.    History Past Medical History:  Diagnosis Date  . Arthritis    left foot  . Asthma   . Endometriosis   . GERD (gastroesophageal reflux disease)   . H/O hiatal hernia   . 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 Hypertension   . Kidney stone     She has a past surgical history that includes Tonsillectomy and adenoidectomy (1967); uterine lining ablation (2002); laparatomy (1985); and Radial head arthroplasty (Left, 02/23/2013).   Her family history includes Diabetes in her father and mother; Hyperlipidemia in her father and mother; Hypertension in her father and mother.She reports that she has never smoked. She has never used smokeless tobacco. She reports that she does not drink alcohol or use drugs.  Current Outpatient Prescriptions on File Prior to Visit  Medication Sig Dispense Refill  . Ascorbic Acid (VITAMIN C) 1000 MG tablet Take 1,000 mg by mouth daily.    . cholecalciferol (VITAMIN D) 1000 UNITS tablet Take 1,000 Units by mouth daily.    [name] glucose blood test strip ONETOUCH VERIO-CHECK BLOOD SUGAR THREE TIMES A DAY 200 each 12  .  predniSONE (DELTASONE) 20 MG tablet Take 3 tablets (60 mg total) by mouth daily with breakfast. For a total of 7 days. 21 tablet 0  . valACYclovir (VALTREX) 1000 MG tablet Take 1 tablet (1,000 mg total) by mouth 3 (three) times daily. For a total of 7 days. 21 tablet 0   No current facility-administered medications on file prior to visit.      Objective:  Objective  Physical Exam  Constitutional: She is oriented to person, place, and time. She appears well-developed and well-nourished.  HENT:  Head: Normocephalic and atraumatic.  Eyes: Conjunctivae and EOM are normal.  Neck: Normal range of motion. Neck supple. No JVD present. Carotid bruit is not present. No thyromegaly present.  Cardiovascular: Normal rate, regular rhythm and normal heart sounds.   No murmur heard. Pulmonary/Chest: Effort normal and breath sounds normal. No respiratory distress. She has no wheezes. She has no rales. She exhibits no tenderness.  Musculoskeletal: She exhibits no edema.  Neurological: She is alert and oriented to person, place, and time.  Psychiatric: She has a normal mood and affect.  Nursing note and vitals reviewed. Sensory exam of the foot is normal, tested with the monofilament. Good pulses, no lesions or ulcers, good peripheral pulses. BP 128/78   Pulse 80   Temp 98.7 F (37.1 C) (Oral)   Ht 5\' 5"  (1.651 m)   Wt 156 lb (70.8 kg)   SpO2 98%   BMI 25.96 kg/m  Wt Readings from Last 3 Encounters:  03/06/17 156 lb (70.8 kg)  02/07/17 157 lb 9.6 oz (71.5 kg)  11/10/16 159 lb (72.1 kg)     Lab Results  Component Value Date   WBC 4.9 03/24/2014   HGB 16.5 (A) 03/24/2014   HCT 48 (A) 03/24/2014   PLT 166 03/24/2014   GLUCOSE 211 (H) 04/03/2013   CHOL 153 03/30/2015   TRIG 248 03/30/2015   HDL 42 03/30/2015   LDLCALC 61 03/30/2015   ALT 52 03/30/2015   AST 37 03/30/2015   NA 137 03/24/2014   K 4.3 03/24/2014   CL 100 04/03/2013   CREATININE 0.9 12/09/2014   BUN 14 03/24/2014   CO2  29 04/03/2013   TSH 1.56 03/24/2014   HGBA1C 6.4 (A) 12/09/2014    No results found.   Assessment & Plan:  Plan  I have discontinued Dominique [name] JANUMET XR. I have also changed her fenofibrate and montelukast. Additionally, I am having her start on Dulaglutide. Lastly, I am having her maintain her vitamin C, cholecalciferol, glucose blood, predniSONE, valACYclovir, albuterol, albuterol, beclomethasone, atorvastatin, fluticasone, levalbuterol, losartan, and nateglinide.  Meds ordered this encounter  Medications  . Dulaglutide (TRULICITY) 0.75 MG/0.5ML SOPN    Sig: 0.75 mg sq q week    Dispense:  4 pen    Refill:  2  . albuterol (VENTOLIN HFA) 108 (90 Base) MCG/ACT inhaler    Sig: Inhale 2 puffs into the lungs every 6 (six) hours as needed for wheezing or shortness of breath. For shortness of breath    Dispense:  3 Inhaler    Refill:  2  . albuterol (VENTOLIN HFA) 108 (90 Base) MCG/ACT inhaler    Sig: Inhale 2 puffs into the lungs every 6 (six) hours as needed for wheezing or shortness of breath. For shortness of breath    Dispense:  3 Inhaler    Refill:  2  . beclomethasone (QVAR) 40 MCG/ACT inhaler    Sig: Inhale 2 puffs into the lungs 2 (two) times daily.    Dispense:  3 Inhaler    Refill:  3  . atorvastatin (LIPITOR) 10 MG tablet    Sig: Take 1 tablet (10 mg total) by mouth every other day.    Dispense:  45 tablet    Refill:  2  . fenofibrate 160 MG tablet    Sig: Take 1 tablet (160 mg total) by mouth daily.    Dispense:  90 tablet    Refill:  1  . fluticasone (VERAMYST) 27.5 MCG/SPRAY nasal spray    Sig: Place 2 sprays into the nose daily. Pt failed fluticasone    Dispense:  30 g    Refill:  3  . levalbuterol (XOPENEX) 1.25 MG/3ML nebulizer solution    Sig: Take 1.25 mg by nebulization every 6 (six) hours as needed for wheezing or shortness of breath. 3 ml nebs    Dispense:  72 mL    Refill:  1  . losartan (COZAAR) 100 MG tablet    Sig: Take 1 tablet (100 mg total)  by mouth daily.    Dispense:  90 tablet    Refill:  1  . montelukast (SINGULAIR) 10 MG tablet    Sig: Take 1 tablet (10 mg total) by mouth at bedtime.    Dispense:  90 tablet    Refill:  1  . nateglinide (STARLIX) 60 MG tablet    Sig: Take 1 tablet by mouth 3  times daily with meals    Dispense:  270 tablet    Refill:  1    Problem List Items Addressed  This Visit      Unprioritized   Bell's palsy    3rd episode --  Refer back to neuro      Relevant Orders   Ambulatory referral to Neurology   DM (diabetes mellitus) type II uncontrolled, periph vascular disorder (HCC) - Primary    hgba1c to be checked, minimize simple carbs. Increase exercise as tolerated. Continue current meds       Relevant Medications   Dulaglutide (TRULICITY) 0.75 MG/0.5ML SOPN   atorvastatin (LIPITOR) 10 MG tablet   fenofibrate 160 MG tablet   losartan (COZAAR) 100 MG tablet   nateglinide (STARLIX) 60 MG tablet   Other Relevant Orders   Lipid panel   Hemoglobin A1c   Comprehensive metabolic panel   Microalbumin / creatinine urine ratio   Essential hypertension    Well controlled, no changes to meds. Encouraged heart healthy diet such as the DASH diet and exercise as tolerated.       Relevant Medications   atorvastatin (LIPITOR) 10 MG tablet   fenofibrate 160 MG tablet   losartan (COZAAR) 100 MG tablet   Hyperlipidemia    Tolerating statin, encouraged heart healthy diet, avoid trans fats, minimize simple carbs and saturated fats. Increase exercise as tolerated      Relevant Medications   atorvastatin (LIPITOR) 10 MG tablet   fenofibrate 160 MG tablet   losartan (COZAAR) 100 MG tablet    Other Visit Diagnoses    Need for immunization against influenza       Relevant Orders   Flu Vaccine QUAD 6+ mos IM (Fluarix) (Completed)   Uncomplicated asthma, unspecified asthma severity, unspecified whether persistent       Relevant Medications   albuterol (VENTOLIN HFA) 108 (90 Base) MCG/ACT inhaler     albuterol (VENTOLIN HFA) 108 (90 Base) MCG/ACT inhaler   beclomethasone (QVAR) 40 MCG/ACT inhaler   levalbuterol (XOPENEX) 1.25 MG/3ML nebulizer solution   montelukast (SINGULAIR) 10 MG tablet   Asthma, chronic, unspecified asthma severity, uncomplicated       Relevant Medications   albuterol (VENTOLIN HFA) 108 (90 Base) MCG/ACT inhaler   albuterol (VENTOLIN HFA) 108 (90 Base) MCG/ACT inhaler   beclomethasone (QVAR) 40 MCG/ACT inhaler   fluticasone (VERAMYST) 27.5 MCG/SPRAY nasal spray   levalbuterol (XOPENEX) 1.25 MG/3ML nebulizer solution   montelukast (SINGULAIR) 10 MG tablet   Seasonal allergies       Relevant Medications   montelukast (SINGULAIR) 10 MG tablet      Follow-up: Return in about 6 months (around 09/04/2017), or if symptoms worsen or fail to improve, for annual exam, fasting, diabetes II.  Donato SchultzYvonne R Lowne Chase, DO

## 2017-03-06 NOTE — Assessment & Plan Note (Signed)
hgba1c to be checked, minimize simple carbs. Increase exercise as tolerated. Continue current meds  

## 2017-03-06 NOTE — Assessment & Plan Note (Signed)
Well controlled, no changes to meds. Encouraged heart healthy diet such as the DASH diet and exercise as tolerated.  °

## 2017-03-06 NOTE — Patient Instructions (Signed)

## 2017-03-06 NOTE — Assessment & Plan Note (Signed)
3rd episode --  Refer back to neuro

## 2017-03-20 ENCOUNTER — Telehealth: Payer: Self-pay | Admitting: Family Medicine

## 2017-03-20 DIAGNOSIS — IMO0002 Reserved for concepts with insufficient information to code with codable children: Secondary | ICD-10-CM

## 2017-03-20 DIAGNOSIS — E1165 Type 2 diabetes mellitus with hyperglycemia: Principal | ICD-10-CM

## 2017-03-20 DIAGNOSIS — E1151 Type 2 diabetes mellitus with diabetic peripheral angiopathy without gangrene: Secondary | ICD-10-CM

## 2017-03-20 NOTE — Telephone Encounter (Signed)
Inc to 1.5 -- ok to change at pharmacy

## 2017-03-20 NOTE — Telephone Encounter (Signed)
AVERAGE 190 blood sugars. Pt uses [facility] [address]. Pt ph [phone]. Per pt Trulicity dosage needs to be increased.

## 2017-03-21 MED ORDER — DULAGLUTIDE 1.5 MG/0.5ML ~~LOC~~ SOAJ: 1.5000 mg | SUBCUTANEOUS | 2 refills | Status: DC

## 2017-03-21 NOTE — Telephone Encounter (Signed)
Patent notified and rx sent in.

## 2017-04-07 ENCOUNTER — Telehealth: Payer: Self-pay | Admitting: Family Medicine

## 2017-04-07 DIAGNOSIS — E1165 Type 2 diabetes mellitus with hyperglycemia: Principal | ICD-10-CM

## 2017-04-07 DIAGNOSIS — IMO0002 Reserved for concepts with insufficient information to code with codable children: Secondary | ICD-10-CM

## 2017-04-07 DIAGNOSIS — E1151 Type 2 diabetes mellitus with diabetic peripheral angiopathy without gangrene: Secondary | ICD-10-CM

## 2017-04-07 DIAGNOSIS — J302 Other seasonal allergic rhinitis: Secondary | ICD-10-CM

## 2017-04-07 NOTE — Telephone Encounter (Signed)
Copied from CRM [id number]. Topic: Quick Communication - See Telephone Encounter >> Apr 07, 2017 10:22 AM Eston Mouldavis, Bryson Gavia B wrote: CRM for notification. See Telephone encounter for:  PT requested refills on her meds,  2 were not signed by Dr, so [facility] would not refill - neteglinide and montelukest    04/07/17.

## 2017-04-10 MED ORDER — NATEGLINIDE 60 MG PO TABS: ORAL_TABLET | ORAL | 1 refills | Status: DC

## 2017-04-10 MED ORDER — MONTELUKAST SODIUM 10 MG PO TABS: 10.0000 mg | ORAL_TABLET | Freq: Every day | ORAL | 1 refills | Status: DC

## 2017-04-10 NOTE — Telephone Encounter (Signed)
Left detailed message on machine that rxs was [facility]

## 2017-04-17 ENCOUNTER — Telehealth: Payer: Self-pay | Admitting: *Deleted

## 2017-04-17 NOTE — Telephone Encounter (Signed)
Is she taking the starlix tid ?  trulicity is the highest dose If taking both as rx--- we will have to add meds Maybe farxiga 5 mg #30  1 po qd , 2 refills

## 2017-04-17 NOTE — Telephone Encounter (Signed)
Copied from CRM [id number]. Topic: Inquiry >> Apr 17, 2017  9:09 AM Windy KalataMichael, Taylor L, NT wrote: Patient states since the doctor has put her on trulicity 1.5 mg, it is helping but not enough  7 day average - 137 14 day average - 150 30 day average - 176 90 day average - 185  Pt would like a call back, and she states she has 1 more pill left, and will need the RX sent in to [facility] that is on file.

## 2017-04-18 NOTE — Telephone Encounter (Signed)
Patient will pick up new trulicity, work on diet and exercise.  Does not want to add medication.

## 2017-05-17 ENCOUNTER — Telehealth: Payer: Self-pay | Admitting: Family Medicine

## 2017-05-17 ENCOUNTER — Other Ambulatory Visit: Payer: Self-pay

## 2017-05-17 MED ORDER — DULAGLUTIDE 1.5 MG/0.5ML ~~LOC~~ SOAJ: 1.5000 mg | SUBCUTANEOUS | 2 refills | Status: DC

## 2017-05-17 NOTE — Telephone Encounter (Signed)
° °  Pt call to say she want [facility] cause she need the med before Sunday. She then will need her  rx sent to [facility] for 90 day supply

## 2017-05-17 NOTE — Telephone Encounter (Signed)
Called pt to clarify which pharmacy to send refill to. Per TE she is "almost out". Asked to [facility]

## 2017-05-17 NOTE — Telephone Encounter (Signed)
Copied from CRM [id number]. Topic: Quick Communication - See Telephone Encounter >> May 17, 2017 10:55 AM [name], Dominique [name] wrote: CRM for notification. See Telephone encounter for: pt needs refill of her  Dulaglutide (TRULICITY) 1.5 MG/0.5ML SOPN [[id number]][[id number]]. 90 day supply.  She needs this asap.  She is almost out and [facility], which I do not see.    Pharmacy - [facility]   05/17/17.

## 2017-05-22 ENCOUNTER — Telehealth: Payer: Self-pay | Admitting: Family Medicine

## 2017-05-22 NOTE — Telephone Encounter (Signed)
Called patient and left VM at [phone] to clarify the call she made to office about her Trulicity. A refill was called [facility] to [facility] on 05/17/17 for 30 days with 2 refills. 

## 2017-05-22 NOTE — Telephone Encounter (Signed)
Called patient and left VM at [phone] to clarify the call she made to office about her Trulicity. A refill was called [facility] to [facility] on 05/17/17 for 30 days with 2 refills.

## 2017-05-22 NOTE — Addendum Note (Signed)
Addended by: Wilford CornerOVINGTON, Lenzi Marmo W on: 05/22/2017 10:33 AM   Modules accepted: Level of Service, SmartSet

## 2017-05-22 NOTE — Telephone Encounter (Signed)
This encounter was created in error - please disregard.

## 2017-05-22 NOTE — Telephone Encounter (Signed)
Patient states [facility]. Needs 2 more months called in. Please advise. Patient requesting call once sent [phone].

## 2017-05-31 DIAGNOSIS — E119 Type 2 diabetes mellitus without complications: Secondary | ICD-10-CM | POA: Diagnosis not present

## 2017-06-16 ENCOUNTER — Telehealth: Payer: Self-pay | Admitting: *Deleted

## 2017-06-16 DIAGNOSIS — E785 Hyperlipidemia, unspecified: Secondary | ICD-10-CM

## 2017-06-16 DIAGNOSIS — E1165 Type 2 diabetes mellitus with hyperglycemia: Secondary | ICD-10-CM

## 2017-06-16 DIAGNOSIS — E1151 Type 2 diabetes mellitus with diabetic peripheral angiopathy without gangrene: Secondary | ICD-10-CM

## 2017-06-16 DIAGNOSIS — I10 Essential (primary) hypertension: Secondary | ICD-10-CM

## 2017-06-16 DIAGNOSIS — IMO0002 Reserved for concepts with insufficient information to code with codable children: Secondary | ICD-10-CM

## 2017-06-16 NOTE — Telephone Encounter (Signed)
Copied from CRM [id number]. Topic: Quick Communication - See Telephone Encounter >> Jun 15, 2017  9:53 AM Herby AbrahamJohnson, Dominique C wrote: CRM for notification. See Telephone encounter for: pt called in because she said that she was advised by provider to have her A1C checked. Pt says that she would like to take the order to [facility]. Pt would like to know if she could have a copy of the order so that she can take it to [facility]? Please advise.    CB: [phone]: [phone]   06/15/17.

## 2017-06-16 NOTE — Telephone Encounter (Signed)
reqs printed and is up front for pickup.  Patient notified.

## 2017-06-20 DIAGNOSIS — E785 Hyperlipidemia, unspecified: Secondary | ICD-10-CM | POA: Diagnosis not present

## 2017-06-20 DIAGNOSIS — E1159 Type 2 diabetes mellitus with other circulatory complications: Secondary | ICD-10-CM | POA: Diagnosis not present

## 2017-06-20 DIAGNOSIS — E1165 Type 2 diabetes mellitus with hyperglycemia: Secondary | ICD-10-CM | POA: Diagnosis not present

## 2017-06-20 LAB — BASIC METABOLIC PANEL
BUN: 9 (ref 4–21)
CREATININE: 0.8 (ref 0.5–1.1)
Glucose: 157
Potassium: 4.6 (ref 3.4–5.3)
Sodium: 145 (ref 137–147)

## 2017-06-20 LAB — HEPATIC FUNCTION PANEL
ALK PHOS: 81 (ref 25–125)
ALT: 47 — AB (ref 7–35)
AST: 33 (ref 13–35)
BILIRUBIN, TOTAL: 0.7

## 2017-06-20 LAB — HEMOGLOBIN A1C: HEMOGLOBIN A1C: 7.5

## 2017-06-20 LAB — LIPID PANEL
Cholesterol: 112 (ref 0–200)
HDL: 42 (ref 35–70)
LDL Cholesterol: 53
TRIGLYCERIDES: 85 (ref 40–160)

## 2017-06-27 ENCOUNTER — Telehealth: Payer: Self-pay | Admitting: *Deleted

## 2017-06-27 NOTE — Telephone Encounter (Signed)
Copied from CRM [phone]. Topic: Inquiry >> Jun 27, 2017  1:46 PM Landry MellowFoltz, Melissa J wrote: Reason for CRM: [phone] Pt would like to have call about labs done last week at [facility]

## 2017-06-30 ENCOUNTER — Encounter: Payer: Self-pay | Admitting: *Deleted

## 2017-06-30 NOTE — Telephone Encounter (Signed)
Patients A1C is 7.5.  You asked was she taking trulicity regularly and the starlix TID and to add Farxiga if yes.  She stated that she takes her trulicity every Sunday and the Starlix she sometimes miss her lunch dose.  She does not want to add another medication at time.  Do you recommend any thing else she can do?  Weight and wellness info mailed to her with copy of labs.

## 2017-06-30 NOTE — Telephone Encounter (Signed)
TAKE MEDS REGUARLY AND RECHECK 64M

## 2017-07-03 MED ORDER — DULAGLUTIDE 1.5 MG/0.5ML ~~LOC~~ SOAJ: 1.5000 mg | SUBCUTANEOUS | 1 refills | Status: DC

## 2017-07-03 NOTE — Telephone Encounter (Signed)
[facility]  Patient notified to recheck in 3 mos.

## 2017-07-18 ENCOUNTER — Ambulatory Visit: Payer: 59 | Admitting: Nurse Practitioner

## 2017-07-18 ENCOUNTER — Ambulatory Visit: Payer: Self-pay | Admitting: *Deleted

## 2017-07-18 VITALS — BP 142/80 | HR 99 | Temp 98.3°F | Wt 156.4 lb

## 2017-07-18 DIAGNOSIS — G510 Bell's palsy: Secondary | ICD-10-CM

## 2017-07-18 MED ORDER — PREDNISONE 20 MG PO TABS: ORAL_TABLET | ORAL | 0 refills | Status: AC

## 2017-07-18 MED ORDER — VALACYCLOVIR HCL 1 G PO TABS: 1000.0000 mg | ORAL_TABLET | Freq: Two times a day (BID) | ORAL | 0 refills | Status: DC

## 2017-07-18 MED ORDER — PREDNISONE 20 MG PO TABS: ORAL_TABLET | ORAL | 0 refills | Status: DC

## 2017-07-18 MED ORDER — VALACYCLOVIR HCL 1 G PO TABS: 1000.0000 mg | ORAL_TABLET | Freq: Three times a day (TID) | ORAL | 0 refills | Status: DC

## 2017-07-18 NOTE — Telephone Encounter (Signed)
Pt reports H/O Bells Palsy, dx 2014. States has occasional "flare ups" which requires course of prednisone and valtrex. Palsy is right sided.  States right ear pain, onset last night, right lower lip and cheek feel "heavy." states "spasms" at right side face, below ear.  Last seen with similar symptoms 02/07/17. Denies any one sided weakness, numbness; no speech difficulties or confusion. Denies CP, SOB. States "I have never fully recovered from the palsy."  Pt of SW, no availability; [facility]. Appt made for today with C. Nche. Instructed to go to ED if symptoms worsen; if any one sided weakness, difficulties with speech, onset of headache, swallowing, facial droop,SOB.  Reason for Disposition . Earache  (Exceptions: brief ear pain of < 60 minutes duration, earache occurring during air travel  Answer Assessment - Initial Assessment Questions 1. LOCATION: "Which ear is involved?"     Right 2. ONSET: "When did the ear start hurting"      Last night 3. SEVERITY: "How bad is the pain?"  (Scale 1-10; mild, moderate or severe)   - MILD (1-3): doesn't interfere with normal activities    - MODERATE (4-7): interferes with normal activities or awakens from sleep    - SEVERE (8-10): excruciating pain, unable to do any normal activities      Moderate last night 'better this am." 4. URI SYMPTOMS: " Do you have a runny nose or cough?"     no 5. FEVER: "Do you have a fever?" If so, ask: "What is your temperature, how was it measured, and when did it start?"     no 6. CAUSE: "Have you been swimming recently?", "How often do you use Q-TIPS?", "Have you had any recent air travel or scuba diving?"     no 7. OTHER SYMPTOMS: "Do you have any other symptoms?" (e.g., headache, stiff neck, dizziness, vomiting, runny nose, decreased hearing)     H/O Bell's Palsy, states "flares up." right lower lip and cheek heavy, spasms "below ear."  No speech difficulty, no one sided weakness, no CP, SOB; no  headache, numbness.  Protocols used: EARACHE-A-AH

## 2017-07-18 NOTE — Patient Instructions (Addendum)
Short course of oral prednisone given due to un  Monitor glucose twice a day. Call office if glucose >300 for more than 3days.  You will be contacted to schedule appt with neurology.  Bell Palsy, Adult Bell palsy is a short-term inability to move muscles in part of the face. The inability to move (paralysis) results from inflammation or compression of the facial nerve, which travels along the skull and under the ear to the side of the face (7th cranial nerve). This nerve is responsible for facial movements that include blinking, closing the eyes, smiling, and frowning. What are the causes? The exact cause of this condition is not known. It may be caused by an infection from a virus, such as the chickenpox (herpes zoster), Epstein-Barr, or mumps virus. What increases the risk? You are more likely to develop this condition if:  You are pregnant.  You have diabetes.  You have had a recent infection in your nose, throat, or airways (upper respiratory infection).  You have a weakened body defense system (immune system).  You have had a facial injury, such as a fracture.  You have a family history of Bell palsy.  What are the signs or symptoms? Symptoms of this condition include:  Weakness on one side of the face.  Drooping eyelid and corner of the mouth.  Excessive tearing in one eye.  Difficulty closing the eyelid.  Dry eye.  Drooling.  Dry mouth.  Changes in taste.  Change in facial appearance.  Pain behind one ear.  Ringing in one or both ears.  Sensitivity to sound in one ear.  Facial twitching.  Headache.  Impaired speech.  Dizziness.  Difficulty eating or drinking.  Most of the time, only one side of the face is affected. Rarely, Bell palsy affects the whole face. How is this diagnosed? This condition is diagnosed based on:  Your symptoms.  Your medical history.  A physical exam.  You may also have to see health care providers who specialize  in disorders of the nerves (neurologist) or diseases and conditions of the eye (ophthalmologist). You may have tests, such as:  A test to check for nerve damage (electromyogram).  Imaging studies, such as CT or MRI scans.  Blood tests.  How is this treated? This condition affects every person differently. Sometimes symptoms go away without treatment within a couple weeks. If treatment is needed, it varies from person to person. The goal of treatment is to reduce inflammation and protect the eye from damage. Treatment for Bell palsy may include:  Medicines, such as: ? Steroids to reduce swelling and inflammation. ? Antiviral drugs. ? Pain relievers, including aspirin, acetaminophen, or ibuprofen.  Eye drops or ointment to keep your eye moist.  Eye protection, if you cannot close your eye.  Exercises or massage to regain muscle strength and function (physical therapy).  Follow these instructions at home:  Take over-the-counter and prescription medicines only as told by your health care provider.  If your eye is affected: ? Keep your eye moist with eye drops or ointment as told by your health care provider. ? Follow instructions for eye care and protection as told by your health care provider.  Do any physical therapy exercises as told by your health care provider.  Keep all follow-up visits as told by your health care provider. This is important. Contact a health care provider if:  You have a fever.  Your symptoms do not get better within 2-3 weeks, or your symptoms get  worse.  Your eye is red, irritated, or painful.  You have new symptoms. Get help right away if:  You have weakness or numbness in a part of your body other than your face.  You have trouble swallowing.  You develop neck pain or stiffness.  You develop dizziness or shortness of breath. Summary  Bell palsy is a short-term inability to move muscles in part of the face. The inability to move (paralysis)  results from inflammation or compression of the facial nerve.  This condition affects every person differently. Sometimes symptoms go away without treatment within a couple weeks.  If treatment is needed, it varies from person to person. The goal of treatment is to reduce inflammation and protect the eye from damage.  Contact your health care provider if your symptoms do not get better within 2-3 weeks, or your symptoms get worse. This information is not intended to replace advice given to you by your health care provider. Make sure you discuss any questions you have with your health care provider. Document Released: 05/02/2005 Document Revised: 07/05/2016 Document Reviewed: 07/05/2016 Elsevier Interactive Patient Education  [employer]2018 [employer]

## 2017-07-18 NOTE — Telephone Encounter (Signed)
FYI

## 2017-07-18 NOTE — Progress Notes (Signed)
Subjective:  Patient ID: Dominique [name], female    DOB: 10-May-1961  Age: 57 y.o. MRN: [id number]  CC: Acute Visit (pain in left ear, drooling from left side of mouth)   Neurologic Problem  The patient's primary symptoms include focal weakness and slurred speech. The patient's pertinent negatives include no altered mental status, clumsiness, focal sensory loss, loss of balance, memory loss, near-syncope, syncope, visual change or weakness. This is a recurrent problem. The current episode started more than 1 year ago. The neurological problem developed suddenly. The problem has been waxing and waning since onset. There was facial and right-sided focality noted. Pertinent negatives include no abdominal pain, auditory change, aura, back pain, bladder incontinence, bowel incontinence, chest pain, confusion, diaphoresis, dizziness, fatigue, fever, headaches, light-headedness, nausea, neck pain, palpitations, shortness of breath, vertigo or vomiting. Past treatments include nothing. There is no history of a bleeding disorder, a clotting disorder, a CVA, dementia, head trauma, liver disease or mood changes.  last exacerabtion 02/2017. Referred to neurology. Did not make appt with neurology as recommended. 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  Outpatient Medications Prior to Visit  Medication Sig Dispense Refill  . albuterol (VENTOLIN HFA) 108 (90 Base) MCG/ACT inhaler Inhale 2 puffs into the lungs every 6 (six) hours as needed for wheezing or shortness of breath. For shortness of breath 3 Inhaler 2  . albuterol (VENTOLIN HFA) 108 (90 Base) MCG/ACT inhaler Inhale 2 puffs into the lungs every 6 (six) hours as needed for wheezing or shortness of breath. For shortness of breath 3 Inhaler 2  . Ascorbic Acid (VITAMIN C) 1000 MG tablet Take 1,000 mg by mouth daily.    [name] atorvastatin (LIPITOR) 10 MG tablet Take 1 tablet (10 mg total) by mouth every  other day. 45 tablet 2  . beclomethasone (QVAR) 40 MCG/ACT inhaler Inhale 2 puffs into the lungs 2 (two) times daily. 3 Inhaler 3  . cholecalciferol (VITAMIN D) 1000 UNITS tablet Take 1,000 Units by mouth daily.    . Dulaglutide (TRULICITY) 1.5 MG/0.5ML SOPN Inject 1.5 mg into the skin once a week. 12 pen 1  . fenofibrate 160 MG tablet Take 1 tablet (160 mg total) by mouth daily. 90 tablet 1  . glucose blood test strip ONETOUCH VERIO-CHECK BLOOD SUGAR THREE TIMES A DAY 200 each 12  . levalbuterol (XOPENEX) 1.25 MG/3ML nebulizer solution Take 1.25 mg by nebulization every 6 (six) hours as needed for wheezing or shortness of breath. 3 ml nebs 72 mL 1  . losartan (COZAAR) 100 MG tablet Take 1 tablet (100 mg total) by mouth daily. 90 tablet 1  . montelukast (SINGULAIR) 10 MG tablet Take 1 tablet (10 mg total) by mouth at bedtime. 90 tablet 1  . nateglinide (STARLIX) 60 MG tablet Take 1 tablet by mouth 3  times daily with meals 270 tablet 1  . fluticasone (VERAMYST) 27.5 MCG/SPRAY nasal spray Place 2 sprays into the nose daily. Pt failed fluticasone 30 g 3  . predniSONE (DELTASONE) 20 MG tablet Take 3 tablets (60 mg total) by mouth daily with breakfast. For a total of 7 days. 21 tablet 0  . valACYclovir (VALTREX) 1000 MG tablet Take 1 tablet (1,000 mg total) by mouth 3 (three) times daily. For a total of 7 days. 21 tablet 0   No facility-administered medications prior to visit.     ROS See HPI  Objective:  BP (!) 142/80 (BP Location: Left Arm,  Patient Position: Sitting, Cuff Size: Normal)   Pulse 99   Temp 98.3 F (36.8 C) (Oral)   Wt 156 lb 6.4 oz (70.9 kg)   SpO2 97%   BMI 26.03 kg/m   BP Readings from Last 3 Encounters:  07/18/17 (!) 142/80  03/06/17 128/78  02/07/17 134/80    Wt Readings from Last 3 Encounters:  07/18/17 156 lb 6.4 oz (70.9 kg)  03/06/17 156 lb (70.8 kg)  02/07/17 157 lb 9.6 oz (71.5 kg)    Physical Exam  Constitutional: She is oriented to person, place, and  time. No distress.  HENT:  Right Ear: External ear and ear canal normal. No mastoid tenderness. Tympanic membrane is bulging. Tympanic membrane is not injected and not erythematous. A middle ear effusion is present.  Left Ear: External ear and ear canal normal. No mastoid tenderness. Tympanic membrane is not injected, not erythematous and not bulging.  No middle ear effusion.  Eyes: Conjunctivae and EOM are normal. Pupils are equal, round, and reactive to light. Right eye exhibits no chemosis and no discharge. Left eye exhibits no chemosis and no discharge.  Neck: Normal range of motion. Neck supple.  Cardiovascular: Normal rate.  Pulmonary/Chest: Effort normal.  Neurological: She is alert and oriented to person, place, and time. She has normal reflexes. She displays no tremor. No sensory deficit. Coordination and gait normal.  Right facial paralysis. Partial closure of right eyelid.  Vitals reviewed.   Lab Results  Component Value Date   WBC 4.9 03/24/2014   HGB 16.5 (A) 03/24/2014   HCT 48 (A) 03/24/2014   PLT 166 03/24/2014   GLUCOSE 211 (H) 04/03/2013   CHOL 112 06/20/2017   TRIG 85 06/20/2017   HDL 42 06/20/2017   LDLCALC 53 06/20/2017   ALT 47 (A) 06/20/2017   AST 33 06/20/2017   NA 145 06/20/2017   K 4.6 06/20/2017   CL 100 04/03/2013   CREATININE 0.8 06/20/2017   BUN 9 06/20/2017   CO2 29 04/03/2013   TSH 1.56 03/24/2014   HGBA1C 7.5 06/20/2017    Assessment & Plan:   Elease Hashimotoatricia was seen today for acute visit.  Diagnoses and all orders for this visit:  Right-sided Bell's palsy -     Ambulatory referral to Neurology -     Discontinue: predniSONE (DELTASONE) 20 MG tablet; Take 3 tablets (60 mg total) by mouth daily with breakfast for 1 day, THEN 2 tablets (40 mg total) daily with breakfast for 2 days. -     Discontinue: valACYclovir (VALTREX) 1000 MG tablet; Take 1 tablet (1,000 mg total) by mouth 2 (two) times daily. -     valACYclovir (VALTREX) 1000 MG tablet;  Take 1 tablet (1,000 mg total) by mouth 3 (three) times daily. -     predniSONE (DELTASONE) 20 MG tablet; Take 3 tablets (60 mg total) by mouth daily with breakfast for 2 days, THEN 2 tablets (40 mg total) daily with breakfast for 2 days.   I have discontinued Elease HashimotoPatricia A. Rotan's predniSONE, valACYclovir, and fluticasone. I have also changed her valACYclovir and predniSONE. Additionally, I am having her maintain her vitamin C, cholecalciferol, glucose blood, albuterol, albuterol, beclomethasone, atorvastatin, fenofibrate, levalbuterol, losartan, nateglinide, montelukast, and Dulaglutide.  Meds ordered this encounter  Medications  . DISCONTD: predniSONE (DELTASONE) 20 MG tablet    Sig: Take 3 tablets (60 mg total) by mouth daily with breakfast for 1 day, THEN 2 tablets (40 mg total) daily with breakfast for 2 days.  Dispense:  7 tablet    Refill:  0    Order Specific Question:   Supervising Provider    Answer:   Dianne Dun [[id number]]  . DISCONTD: valACYclovir (VALTREX) 1000 MG tablet    Sig: Take 1 tablet (1,000 mg total) by mouth 2 (two) times daily.    Dispense:  14 tablet    Refill:  0    Order Specific Question:   Supervising Provider    Answer:   Dianne Dun [[id number]]  . valACYclovir (VALTREX) 1000 MG tablet    Sig: Take 1 tablet (1,000 mg total) by mouth 3 (three) times daily.    Dispense:  21 tablet    Refill:  0    Order Specific Question:   Supervising Provider    Answer:   Dianne Dun [[id number]]  . predniSONE (DELTASONE) 20 MG tablet    Sig: Take 3 tablets (60 mg total) by mouth daily with breakfast for 2 days, THEN 2 tablets (40 mg total) daily with breakfast for 2 days.    Dispense:  10 tablet    Refill:  0    Change in dose, do not fill previous prescription sent    Order Specific Question:   Supervising Provider    Answer:   Dianne Dun [[id number]]    Follow-up: No Follow-up on file.  Alysia Penna, NP

## 2017-07-19 ENCOUNTER — Encounter: Payer: Self-pay | Admitting: Nurse Practitioner

## 2017-07-19 MED ORDER — PREDNISONE 20 MG PO TABS: 20.0000 mg | ORAL_TABLET | Freq: Every day | ORAL | 0 refills | Status: DC

## 2017-07-24 ENCOUNTER — Encounter: Payer: Self-pay | Admitting: Neurology

## 2017-07-24 ENCOUNTER — Ambulatory Visit: Payer: 59 | Admitting: Neurology

## 2017-07-24 VITALS — BP 118/66 | HR 91 | Ht 63.0 in | Wt 155.5 lb

## 2017-07-24 DIAGNOSIS — G510 Bell's palsy: Secondary | ICD-10-CM | POA: Diagnosis not present

## 2017-07-24 MED ORDER — ALPRAZOLAM 0.5 MG PO TABS: ORAL_TABLET | ORAL | 0 refills | Status: DC

## 2017-07-24 NOTE — Patient Instructions (Signed)
We will check CT of the brain and get blood work today.

## 2017-07-24 NOTE — Progress Notes (Signed)
Reason for visit: Right Bell's palsy  Referring physician: [name] is a 57 y.o. female  History of present illness:  Dominique [name] is a 57 year old right-handed white female with a history of diabetes.  The patient had very poorly controlled diabetes around the time of onset of her Bell's palsy, she was running a hemoglobin A1c around 12-13.  The patient originally noted onset of a severe Bell's palsy on 03 April 2013.  The patient never regained full function of the right face.  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  The patient has had persistent pressure and discomfort in the right cheek area.  Intermittently, she has noted episodes of ear pain and some tingling sensations of the right face and tongue that may come and go.  She had an episode such as this on 27 February 2015, and again on 07 February 2017.  Within the last 5 days, the patient has had a similar event.  The patient has been placed on prednisone and Valtrex.  Her ear pain has gone away, she is left with her usual pressure sensation in the right face.  There is some question whether the right eye is somewhat more droopy.  The patient notes that the eye waters quite a bit.  The patient reports no numbness or weakness of the arms or legs.  She has not had any change in her balance or difficulty controlling the bowels or the bladder.  She does have migraine type headaches off and on over the last 2 years, usually occurring on average about 3 times a year.  The patient has had vertigo in the past, she is not having this currently.  The patient denies any blackouts, she denies any problems with swallowing or choking.  She denies double vision.  She is sent to this office for an evaluation.  Past Medical History:  Diagnosis Date  . Arthritis    left foot  . Asthma   . Endometriosis   . GERD (gastroesophageal reflux disease)   . H/O hiatal hernia   . Histoplasmosis 2002   history of,  [place]  . Hypertension   . Kidney stone     Past Surgical History:  Procedure Laterality Date  . laparatomy  1985  . RADIAL HEAD ARTHROPLASTY Left 02/23/2013   Procedure: LEFT RADIAL HEAD ARTHROPLASTY qwith ligament reconstruction;  Surgeon: Dominica Severin, MD;  Location: [facility] OR;  Service: Orthopedics;  Laterality: Left;  . TONSILLECTOMY AND ADENOIDECTOMY  1967  . uterine lining ablation  2002    Family History  Problem Relation Age of Onset  . Diabetes Mother   . Hyperlipidemia Mother   . Hypertension Mother   . Diabetes Father   . Hyperlipidemia Father   . Hypertension Father     Social history:  reports that  has never smoked. she has never used smokeless tobacco. She reports that she does not drink alcohol or use drugs.  Medications:  Prior to Admission medications   Medication Sig Start Date End Date Taking? Authorizing Provider  albuterol (VENTOLIN HFA) 108 (90 Base) MCG/ACT inhaler Inhale 2 puffs into the lungs every 6 (six) hours as needed for wheezing or shortness of breath. For shortness of breath 03/06/17  Yes Lowne Florina Ou R, DO  albuterol (VENTOLIN HFA) 108 (90 Base) MCG/ACT inhaler Inhale 2 puffs into the lungs every 6 (six) hours as needed for wheezing or shortness of breath. For shortness of breath  03/06/17  Yes Donato SchultzLowne Chase, Yvonne R, DO  Ascorbic Acid (VITAMIN C) 1000 MG tablet Take 1,000 mg by mouth daily.   Yes [provider]  atorvastatin (LIPITOR) 10 MG tablet Take 1 tablet (10 mg total) by mouth every other day. 03/06/17  Yes Seabron SpatesLowne Chase, Yvonne R, DO  cholecalciferol (VITAMIN D) 1000 UNITS tablet Take 1,000 Units by mouth daily.   Yes [provider]  Dulaglutide (TRULICITY) 1.5 MG/0.5ML SOPN Inject 1.5 mg into the skin once a week. 07/03/17  Yes Seabron SpatesLowne Chase, Yvonne R, DO  fenofibrate 160 MG tablet Take 1 tablet (160 mg total) by mouth daily. 03/06/17  Yes Seabron SpatesLowne Chase, Yvonne R, DO  glucose blood test strip [facility]-CHECK  BLOOD SUGAR THREE TIMES A DAY 04/04/14  Yes Zola ButtonLowne Chase, Yvonne R, DO  levalbuterol (XOPENEX) 1.25 MG/3ML nebulizer solution Take 1.25 mg by nebulization every 6 (six) hours as needed for wheezing or shortness of breath. 3 ml nebs 03/06/17  Yes Lowne Florina OuChase, Yvonne R, DO  losartan (COZAAR) 100 MG tablet Take 1 tablet (100 mg total) by mouth daily. 03/06/17  Yes Seabron SpatesLowne Chase, Yvonne R, DO  montelukast (SINGULAIR) 10 MG tablet Take 1 tablet (10 mg total) by mouth at bedtime. 04/10/17  Yes Donato SchultzLowne Chase, Yvonne R, DO  nateglinide (STARLIX) 60 MG tablet Take 1 tablet by mouth 3  times daily with meals 04/10/17  Yes Lowne Florina Ouhase, Yvonne R, DO  valACYclovir (VALTREX) 1000 MG tablet Take 1 tablet (1,000 mg total) by mouth 3 (three) times daily. 07/18/17  Yes Nche, Bonna Gainsharlotte Lum, NP  ALPRAZolam Prudy Feeler(XANAX) 0.5 MG tablet Take 2 tablets approximately 45 minutes prior to the MRI study, take a third tablet if needed. 07/24/17   York SpanielWillis, Claudene Gatliff K, MD  beclomethasone (QVAR) 40 MCG/ACT inhaler Inhale 2 puffs into the lungs 2 (two) times daily. Patient not taking: Reported on 07/24/2017 03/06/17   Seabron SpatesLowne Chase, Yvonne R, DO     No Known Allergies  ROS:  Out of a complete 14 system review of symptoms, the patient complains only of the following symptoms, and all other reviewed systems are negative.  Right facial weakness  Blood pressure 118/66, pulse 91, height 5\' 3"  (1.6 m), weight 155 lb 8 oz (70.5 kg).  Physical Exam  General: The patient is alert and cooperative at the time of the examination.  The patient is moderately obese.  Eyes: Pupils are equal, round, and reactive to light. Discs are flat bilaterally.  Ears: Tympanic membrane on the right is clear, the tympanic membrane on the left is partially obscured by cerumen.  Neck: The neck is supple, no carotid bruits are noted.  Respiratory: The respiratory examination is clear.  Cardiovascular: The cardiovascular examination reveals a regular rate and rhythm,  no obvious murmurs or rubs are noted.  Skin: Extremities are without significant edema.  Neurologic Exam  Mental status: The patient is alert and oriented x 3 at the time of the examination. The patient has apparent normal recent and remote memory, with an apparently normal attention span and concentration ability.  Cranial nerves: Facial symmetry is not present.  There is partial closure of the right eye with smiling.  There is partial elevation of the right eyebrow and corner of the right mouth with smiling.  There is good sensation of the face to pinprick and soft touch bilaterally. The strength of the facial muscles and the muscles to head turning and shoulder shrug are normal bilaterally. Speech is well enunciated, no aphasia  or dysarthria is noted. Extraocular movements are full. Visual fields are full. The tongue is midline, and the patient has symmetric elevation of the soft palate. No obvious hearing deficits are noted.  Motor: The motor testing reveals 5 over 5 strength of all 4 extremities. Good symmetric motor tone is noted throughout.  Sensory: Sensory testing is intact to pinprick, soft touch, vibration sensation, and position sense on all 4 extremities. No evidence of extinction is noted.  Coordination: Cerebellar testing reveals good finger-nose-finger and heel-to-shin bilaterally.  Gait and station: Gait is normal. Tandem gait is normal. Romberg is negative. No drift is seen.  Reflexes: Deep tendon reflexes are symmetric and normal bilaterally. Toes are downgoing bilaterally.   Assessment/Plan:  1.  History of right Bell's palsy  2.  Recurring ear pain, right facial numbness  The patient may be having pseudo-exacerbations of her original Bell's palsy.  The patient has chronic discomfort in the right face.  If desired, she may want to go on medication such as gabapentin for this discomfort.  The patient will be set up for a CT scan of the brain, she indicates that she is  extremely claustrophobic and does not wish to have MRI evaluation.  She will be giving Xanax for the CT.  Blood work will be done today.  Dominique Palau MD 07/24/2017 8:27 AM  [facility] [address] [place], [place] [address]  Phone [phone] Fax [phone]

## 2017-07-25 ENCOUNTER — Telehealth: Payer: Self-pay | Admitting: Neurology

## 2017-07-25 ENCOUNTER — Telehealth: Payer: Self-pay | Admitting: *Deleted

## 2017-07-25 LAB — ANGIOTENSIN CONVERTING ENZYME: Angio Convert Enzyme: 27 U/L (ref 14–82)

## 2017-07-25 LAB — SEDIMENTATION RATE: SED RATE: 5 mm/h (ref 0–40)

## 2017-07-25 LAB — B. BURGDORFI ANTIBODIES

## 2017-07-25 LAB — ANA W/REFLEX: Anti Nuclear Antibody(ANA): NEGATIVE

## 2017-07-25 NOTE — Telephone Encounter (Signed)
Called and spoke with with patient about unremarkable labs per CW,MD note. Pt verbalized understanding.   She stated she was waiting to schedule CT scan. I checked and advised her order sent to [facility]. They will call her to scheduled. If she does not hear she can call them at [phone]. She verbalized understanding.

## 2017-07-25 NOTE — Telephone Encounter (Signed)
07/24/17 [employer] Auth: [id number] (exp. 07/24/17 to 09/07/17) order sent to [facility]

## 2017-07-25 NOTE — Telephone Encounter (Signed)
-----   Message from York Spanielharles K Willis, MD sent at 07/25/2017  4:43 PM EDT -----  The blood work results are unremarkable. Please call the patient.  ----- Message ----- From: Nell RangeInterface, [facility] Lab Results In Sent: 07/25/2017   7:44 AM To: York Spanielharles K Willis, MD

## 2017-08-16 ENCOUNTER — Ambulatory Visit
Admission: RE | Admit: 2017-08-16 | Discharge: 2017-08-16 | Disposition: A | Discharge status: Home / Self Care | Payer: 59 | Source: Ambulatory Visit | Attending: Neurology | Admitting: Neurology

## 2017-08-16 DIAGNOSIS — G510 Bell's palsy: Secondary | ICD-10-CM

## 2017-08-16 MED ORDER — IOPAMIDOL (ISOVUE-300) INJECTION 61%
75.0000 mL | Freq: Once | INTRAVENOUS | Status: AC | PRN
  Administered 2017-08-16: 75 mL via INTRAVENOUS

## 2017-08-18 ENCOUNTER — Telehealth: Payer: Self-pay | Admitting: Neurology

## 2017-08-18 MED ORDER — GABAPENTIN 100 MG PO CAPS: 100.0000 mg | ORAL_CAPSULE | Freq: Three times a day (TID) | ORAL | 3 refills | Status: DC

## 2017-08-18 NOTE — Telephone Encounter (Signed)
I called the patient.  The CT scan of the brain is unremarkable.  The patient is having some discomfort with the residual from the Bell's palsy in 2014.  The patient will go on low-dose gabapentin, she will call for any dose adjustments.  CT brain 08/18/17:  IMPRESSION: This CT scan of the head with and without contrast shows the following: 1.    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 2.    The brain and internal auditory canals otherwise appears normal.    3.    There are no acute findings.

## 2017-08-29 ENCOUNTER — Other Ambulatory Visit: Payer: Self-pay | Admitting: Family Medicine

## 2017-10-22 ENCOUNTER — Other Ambulatory Visit: Payer: Self-pay | Admitting: Family Medicine

## 2017-10-22 DIAGNOSIS — E785 Hyperlipidemia, unspecified: Secondary | ICD-10-CM

## 2017-10-22 DIAGNOSIS — I10 Essential (primary) hypertension: Secondary | ICD-10-CM

## 2017-10-26 ENCOUNTER — Telehealth: Payer: Self-pay | Admitting: *Deleted

## 2017-10-26 DIAGNOSIS — E1151 Type 2 diabetes mellitus with diabetic peripheral angiopathy without gangrene: Secondary | ICD-10-CM

## 2017-10-26 DIAGNOSIS — IMO0002 Reserved for concepts with insufficient information to code with codable children: Secondary | ICD-10-CM

## 2017-10-26 DIAGNOSIS — E1165 Type 2 diabetes mellitus with hyperglycemia: Principal | ICD-10-CM

## 2017-10-26 NOTE — Telephone Encounter (Signed)
Referral placed  After her last injection on Sunday with the Trulicity she had a reaction to it with redness and red dots.  She took benadryl and it went away.  She states that since being on the trulicity her sugars have been in the 200s.

## 2017-10-26 NOTE — Telephone Encounter (Signed)
Try ozempic 0.5 mg pen ---    0.25 mg sq qwk x 4 weeks then go up to 0.5 mg q week

## 2017-10-26 NOTE — Telephone Encounter (Signed)
Ok to put referral in 

## 2017-10-26 NOTE — Telephone Encounter (Signed)
I did not see anything in the notes about endo referral.  Her last a1c was 7.5 in February.  Do you want her to see you first or just do referral?

## 2017-10-26 NOTE — Telephone Encounter (Signed)
Copied from CRM [phone] [id number]. Topic: Referral - Request >> Oct 25, 2017  2:57 PM Alexander BergeronBarksdale, Harvey B wrote: Reason for CRM: pt called for a referral to see a female endocrinologist; pt saw PA Nche on 3.5.19 b/c pcp was not available and this was suggested during that visit, pt has called requesting this to be done but Nche is out of the office until July, call pt if needed

## 2017-10-27 MED ORDER — INSULIN PEN NEEDLE 32G X 4 MM MISC: 0 refills | Status: DC

## 2017-10-27 MED ORDER — SEMAGLUTIDE(0.25 OR 0.5MG/DOS) 2 MG/1.5ML ~~LOC~~ SOPN: 0.5000 mg | PEN_INJECTOR | SUBCUTANEOUS | 2 refills | Status: DC

## 2017-10-27 NOTE — Telephone Encounter (Signed)
Patient calling to see if she can drop trulicity, and start back on janumet. Please advise. Call back [phone]

## 2017-10-27 NOTE — Telephone Encounter (Signed)
See note

## 2017-10-27 NOTE — Telephone Encounter (Signed)
Spoke with patient she will be in to pickup sample to try out first.  Also rx sent to pharmacy along with pen needles.

## 2017-11-14 ENCOUNTER — Ambulatory Visit: Payer: Self-pay | Admitting: *Deleted

## 2017-11-14 NOTE — Telephone Encounter (Signed)
FYI

## 2017-11-14 NOTE — Telephone Encounter (Signed)
Pt called with her blood sugars increasing over the last few days. She was change to ozempic once a week with increasing dosing. She is also taking nateglinide a 60 mg tablet 3 times daily with meals. Her blood sugar an hour ago was 329. She wants to know if she can take an extra dose of the nateglinide. On July 1st, her blood sugars were 289 and 201. And on the June 30 th she got 315 and 312.  She feels tired with the elevation. Advised her to drink more fluids (she doesn't like water). Also to watch her diet and get exercise. That will help with hopefully decreasing her blood sugars.  Pt voiced understand and request call back from the office. [facility]   Will route to them and request a call back from a provider. Pt advised also to go to the ED if she starts feeling increase symptoms with her blood sugar elevations. Voiced understanding.  Reason for Disposition . [1] Caller has URGENT medication or insulin pump question AND [2] triager unable to answer question  Answer Assessment - Initial Assessment Questions 1. BLOOD GLUCOSE: "What is your blood glucose level?"      329 2. ONSET: "When did you check the blood glucose?"     An hour ago 3. USUAL RANGE: "What is your glucose level usually?" (e.g., usual fasting morning value, usual evening value)     Mornings 180 and 190 4. KETONES: "Do you check for ketones (urine or blood test strips)?" If yes, ask: "What does the test show now?"      no 5. TYPE 1 or 2:  "Do you know what type of diabetes you have?"  (e.g., Type 1, Type 2, Gestational; doesn't know)      Type 2 6. INSULIN: "Do you take insulin?" "What type of insulin(s) do you use? What is the mode of delivery? (syringe, pen (e.g., injection or  pump)?"      ozempic and nateglinide 7. DIABETES PILLS: "Do you take any pills for your diabetes?" If yes, ask: "Have you missed taking any pills recently?"    No have not missed any 8. OTHER SYMPTOMS: "Do  you have any symptoms?" (e.g., fever, frequent urination, difficulty breathing, dizziness, weakness, vomiting)     Woozy 9. PREGNANCY: "Is there any chance you are pregnant?" "When was your last menstrual period?"     No periods  Protocols used: DIABETES - HIGH BLOOD SUGAR-A-AH

## 2017-11-15 ENCOUNTER — Telehealth: Payer: Self-pay

## 2017-11-15 NOTE — Telephone Encounter (Signed)
[facility] phoned author re: pt's concern with elevated BG readings. Per [facility], pt. States her 0600 reading today was 211mg /dL, and [id number] reading today was 253mg /dL. See triage note below from 11/14/17 and advise.   "Pt called with her blood sugars increasing over the last few days. She was change to ozempic once a week with increasing dosing. She is also taking nateglinide a 60 mg tablet 3 times daily with meals. Her blood sugar an hour ago was 329. She wants to know if she can take an extra dose of the nateglinide. On July 1st, her blood sugars were 289 and 201. And on the June 30 th she got 315 and 312.  She feels tired with the elevation. Advised her to drink more fluids (she doesn't like water). Also to watch her diet and get exercise. That will help with hopefully decreasing her blood sugars.  Pt voiced understand and request call back from the office. [facility].   Will route to them and request a call back from a provider. Pt advised also to go to the ED if she starts feeling increase symptoms with her blood sugar elevations. Voiced understanding".   Reason for Disposition . [1] Caller has URGENT medication or insulin pump question AND [2] triager unable to answer question  Answer Assessment - Initial Assessment Questions 1. BLOOD GLUCOSE: "What is your blood glucose level?"      329 2. ONSET: "When did you check the blood glucose?"     An hour ago 3. USUAL RANGE: "What is your glucose level usually?" (e.g., usual fasting morning value, usual evening value)     Mornings 180 and 190 4. KETONES: "Do you check for ketones (urine or blood test strips)?" If yes, ask: "What does the test show now?"      no 5. TYPE 1 or 2:  "Do you know what type of diabetes you have?"  (e.g., Type 1, Type 2, Gestational; doesn't know)      Type 2 6. INSULIN: "Do you take insulin?" "What type of insulin(s) do you use? What is the mode of delivery? (syringe, pen (e.g., injection or   pump)?"      ozempic and nateglinide 7. DIABETES PILLS: "Do you take any pills for your diabetes?" If yes, ask: "Have you missed taking any pills recently?"    No have not missed any 8. OTHER SYMPTOMS: "Do you have any symptoms?" (e.g., fever, frequent urination, difficulty breathing, dizziness, weakness, vomiting)     Woozy 9. PREGNANCY: "Is there any chance you are pregnant?" "When was your last menstrual period?"     No periods  Protocols used: DIABETES - HIGH BLOOD SUGAR-A-AH

## 2017-11-15 NOTE — Telephone Encounter (Signed)
Called the patient and she is seeing her Endo in August (cannot get in any earlier)---she is going to call back to schedule with her PCP asap.  She has been on metformin in the past and she stated it did nothing for her.

## 2017-11-15 NOTE — Telephone Encounter (Signed)
Would not rec taking an extra dose as it likely won't have major benefit. Have her schedule an appt with Dr. Laury AxonLowne in next 1-2 business days. Also see if she has been on Metformin before. TY.

## 2017-11-15 NOTE — Telephone Encounter (Signed)
Patient called and says her blood sugars are still high. This morning ant [phone] and at [phone]. She asks can she take an extra Starlix when her blood sugar is elevated. I called the office and spoke to [place], [facility] who says she will forward this information to the on call provider and someone will call her back this afternoon with the recommendation. I advised the patient of the above and she verbalized understanding.

## 2017-11-16 NOTE — Telephone Encounter (Signed)
Yes she can take an extra Starlix occasionally but it can drop the sugars quite a bit. So would not do that unless sugars significantly above 300 and she is eating well

## 2017-11-17 NOTE — Telephone Encounter (Signed)
Left message on machine to call back  

## 2017-11-17 NOTE — Telephone Encounter (Signed)
Patient already been called and told a different story.  Advised that I will call on Monday to see if we can get her in sooner with endocrinology.

## 2017-11-20 NOTE — Telephone Encounter (Signed)
Can we get her in sooner with a different endo?

## 2017-11-20 NOTE — Telephone Encounter (Signed)
Pt. Is checking on Endo appt and  nateglinide (STARLIX) 60 MG tablet If she is to take this.  Has been told yes and no. Please call

## 2017-11-20 NOTE — Telephone Encounter (Signed)
She is asking if can take more when Blood Sugar is higher.

## 2017-11-21 NOTE — Telephone Encounter (Signed)
She can take 120 mg tid but I would inc slowly--  Take 120 mg in am and then 60 mid day and 60 at night --- after few days ok to take 120 bid and 60 at night and then in to 120 tid

## 2017-11-21 NOTE — Telephone Encounter (Signed)
Called to try and get a sooner an appointment and they do not have a sooner appointment.  Can she take more of Starlix?

## 2017-11-21 NOTE — Telephone Encounter (Signed)
Left message on vm that they do not have any earlier appointments with any of the docs.  Will ask Dr. Laury AxonLowne about Starlix.  See other phone message.

## 2017-11-21 NOTE — Telephone Encounter (Signed)
Patient notified

## 2017-12-10 ENCOUNTER — Other Ambulatory Visit: Payer: Self-pay | Admitting: Neurology

## 2017-12-15 ENCOUNTER — Encounter: Payer: Self-pay | Admitting: Internal Medicine

## 2017-12-20 ENCOUNTER — Other Ambulatory Visit: Payer: Self-pay

## 2017-12-20 ENCOUNTER — Encounter (HOSPITAL_BASED_OUTPATIENT_CLINIC_OR_DEPARTMENT_OTHER): Payer: Self-pay

## 2017-12-20 ENCOUNTER — Emergency Department (HOSPITAL_BASED_OUTPATIENT_CLINIC_OR_DEPARTMENT_OTHER)
Admission: EM | Admit: 2017-12-20 | Discharge: 2017-12-20 | Disposition: A | Discharge status: Home / Self Care | Payer: 59 | Attending: Emergency Medicine | Admitting: Emergency Medicine

## 2017-12-20 ENCOUNTER — Emergency Department (HOSPITAL_BASED_OUTPATIENT_CLINIC_OR_DEPARTMENT_OTHER): Payer: 59

## 2017-12-20 DIAGNOSIS — Z794 Long term (current) use of insulin: Secondary | ICD-10-CM | POA: Diagnosis not present

## 2017-12-20 DIAGNOSIS — J45909 Unspecified asthma, uncomplicated: Secondary | ICD-10-CM | POA: Insufficient documentation

## 2017-12-20 DIAGNOSIS — E119 Type 2 diabetes mellitus without complications: Secondary | ICD-10-CM | POA: Insufficient documentation

## 2017-12-20 DIAGNOSIS — Z79899 Other long term (current) drug therapy: Secondary | ICD-10-CM | POA: Diagnosis not present

## 2017-12-20 DIAGNOSIS — N201 Calculus of ureter: Secondary | ICD-10-CM

## 2017-12-20 DIAGNOSIS — R109 Unspecified abdominal pain: Secondary | ICD-10-CM | POA: Diagnosis not present

## 2017-12-20 DIAGNOSIS — I10 Essential (primary) hypertension: Secondary | ICD-10-CM | POA: Diagnosis not present

## 2017-12-20 LAB — URINALYSIS, ROUTINE W REFLEX MICROSCOPIC
BILIRUBIN URINE: NEGATIVE
Glucose, UA: NEGATIVE mg/dL
KETONES UR: NEGATIVE mg/dL
Leukocytes, UA: NEGATIVE
NITRITE: NEGATIVE
PH: 5 (ref 5.0–8.0)
Protein, ur: NEGATIVE mg/dL

## 2017-12-20 LAB — URINALYSIS, MICROSCOPIC (REFLEX): RBC / HPF: >50 RBC/hpf (ref 0–5)

## 2017-12-20 MED ORDER — NAPROXEN 375 MG PO TABS: 375.0000 mg | ORAL_TABLET | Freq: Two times a day (BID) | ORAL | 0 refills | Status: DC

## 2017-12-20 MED ORDER — NAPROXEN 250 MG PO TABS
500.0000 mg | ORAL_TABLET | Freq: Once | ORAL | Status: AC
  Administered 2017-12-20: 500 mg via ORAL
  Filled 2017-12-20: qty 2

## 2017-12-20 MED ORDER — ONDANSETRON HCL 4 MG PO TABS: 4.0000 mg | ORAL_TABLET | Freq: Four times a day (QID) | ORAL | 0 refills | Status: DC | PRN

## 2017-12-20 MED ORDER — TAMSULOSIN HCL 0.4 MG PO CAPS: 0.4000 mg | ORAL_CAPSULE | Freq: Every day | ORAL | 0 refills | Status: DC

## 2017-12-20 MED ORDER — OXYCODONE-ACETAMINOPHEN 5-325 MG PO TABS: 1.0000 | ORAL_TABLET | ORAL | 0 refills | Status: DC | PRN

## 2017-12-20 MED FILL — TAMSULOSIN HCL 0.4 MG CAP: 0.4 | 10 days supply | Qty: 10 | Fill #0

## 2017-12-20 MED FILL — OXYCODONE-ACETAMINOPHEN 5-3: 5-325 | 2 days supply | Qty: 10 | Fill #0

## 2017-12-20 MED FILL — NAPROXEN 375 MG TABLET: 375 | 15 days supply | Qty: 30 | Fill #0

## 2017-12-20 MED FILL — ONDANSETRON HCL 4 MG TABLET: 4 | 3 days supply | Qty: 12 | Fill #0

## 2017-12-20 NOTE — ED Notes (Signed)
Patient transported to CT 

## 2017-12-20 NOTE — ED Triage Notes (Signed)
Pt reports 4/10 left flank pain w/o radiation since Monday and urinary urgency. Pt denies dysuria. Pt A+OX4,

## 2017-12-20 NOTE — ED Provider Notes (Signed)
[facility] EMERGENCY DEPARTMENT Provider Note   CSN: [id number] Arrival date & time: 12/20/17  4098     History   Chief Complaint Chief Complaint  Patient presents with  . Flank Pain    Left    HPI Dominique [name] is a 57 y.o. female.  The history is provided by the patient.  She has history of hypertension, asthma, GERD, kidney stone and comes in with left flank pain which started yesterday.  It was relatively mild.  Last night, she started having urinary urgency and frequency with urinary hesitancy.  She denies dysuria.  She denies fever or chills.  She denies nausea or vomiting.  Pain that she is having is not similar to what she had with her kidney stone.  Pain is rated at 4/10.  Nothing makes it better, nothing makes it worse.  Past Medical History:  Diagnosis Date  . Arthritis    left foot  . Asthma   . Endometriosis   . GERD (gastroesophageal reflux disease)   . H/O hiatal hernia   . 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 Hypertension   . Kidney stone     Patient Active Problem List   Diagnosis Date Noted  . BPV (benign positional vertigo) 01/04/2016  . Asthma with exacerbation 09/22/2014  . Acute bronchitis 04/24/2014  . DM (diabetes mellitus) type II uncontrolled, periph vascular disorder (HCC) 03/27/2014  . Hyperlipidemia 03/27/2014  . Right-sided Bell's palsy 04/05/2013  . ALLERGIC RHINITIS 07/08/2010  . GERD 07/08/2010  . WEIGHT GAIN 01/13/2010  . KERATOSIS 10/09/2008  . BURSITIS, RIGHT SHOULDER 10/09/2008  . HOARSENESS, CHRONIC 03/24/2008  . OTITIS MEDIA, SEROUS, CHRONIC, RIGHT 03/26/2007  . Essential hypertension 03/14/2007    Past Surgical History:  Procedure Laterality Date  . laparatomy  1985  . RADIAL HEAD ARTHROPLASTY Left 02/23/2013   Procedure: LEFT RADIAL HEAD ARTHROPLASTY qwith ligament reconstruction;  Surgeon: Dominica Severin, MD;  Location: [facility] OR;  Service: Orthopedics;  Laterality: Left;  . TONSILLECTOMY  AND ADENOIDECTOMY  1967  . uterine lining ablation  2002     OB History   None      Home Medications    Prior to Admission medications   Medication Sig Start Date End Date Taking? Authorizing Provider  albuterol (VENTOLIN HFA) 108 (90 Base) MCG/ACT inhaler Inhale 2 puffs into the lungs every 6 (six) hours as needed for wheezing or shortness of breath. For shortness of breath 03/06/17   Zola Button, Myrene Buddy R, DO  albuterol (VENTOLIN HFA) 108 (90 Base) MCG/ACT inhaler Inhale 2 puffs into the lungs every 6 (six) hours as needed for wheezing or shortness of breath. For shortness of breath 03/06/17   Zola Button, Grayling Congress, DO  ALPRAZolam Prudy Feeler) 0.5 MG tablet Take 2 tablets approximately 45 minutes prior to the MRI study, take a third tablet if needed. 07/24/17   York Spaniel, MD  Ascorbic Acid (VITAMIN C) 1000 MG tablet Take 1,000 mg by mouth daily.    [provider]  atorvastatin (LIPITOR) 10 MG tablet Take 1 tablet (10 mg total) by mouth every other day. 03/06/17   Donato Schultz, DO  beclomethasone (QVAR) 40 MCG/ACT inhaler Inhale 2 puffs into the lungs 2 (two) times daily. Patient not taking: Reported on 07/24/2017 03/06/17   Seabron Spates R, DO  cholecalciferol (VITAMIN D) 1000 UNITS tablet Take 1,000 Units by mouth daily.    [provider]  fenofibrate 160 MG tablet  TAKE ONE TABLET BY MOUTH DAILY 10/23/17   Zola ButtonLowne Chase, Grayling CongressYvonne R, DO  gabapentin (NEURONTIN) 100 MG capsule TAKE 1 CAPSULE(100 MG) BY MOUTH THREE TIMES DAILY 12/11/17   York SpanielWillis, Charles K, MD  glucose blood test strip [facility]LO[facility] SUGAR THREE TIMES A DAY 04/04/14   Donato SchultzLowne Chase, Yvonne R, DO  Insulin Pen Needle 32G X 4 MM MISC Use with Ozempic once weekly 10/27/17   Zola ButtonLowne Chase, Grayling CongressYvonne R, DO  levalbuterol (XOPENEX) 1.25 MG/3ML nebulizer solution Take 1.25 mg by nebulization every 6 (six) hours as needed for wheezing or shortness of breath. 3 ml nebs 03/06/17   Zola ButtonLowne Chase, Yvonne R,  DO  losartan (COZAAR) 100 MG tablet TAKE ONE TABLET BY MOUTH DAILY 10/23/17   Zola ButtonLowne Chase, Myrene BuddyYvonne R, DO  montelukast (SINGULAIR) 10 MG tablet Take 1 tablet (10 mg total) by mouth at bedtime. 04/10/17   Donato SchultzLowne Chase, Yvonne R, DO  nateglinide (STARLIX) 60 MG tablet Take 1 tablet by mouth 3  times daily with meals 04/10/17   Zola ButtonLowne Chase, Yvonne R, DO  Semaglutide (OZEMPIC) 0.25 or 0.5 MG/DOSE SOPN Inject 0.5 mg into the skin once a week. 10/27/17   Donato SchultzLowne Chase, Yvonne R, DO  valACYclovir (VALTREX) 1000 MG tablet Take 1 tablet (1,000 mg total) by mouth 3 (three) times daily. 07/18/17   Nche, Bonna Gainsharlotte Lum, NP    Family History Family History  Problem Relation Age of Onset  . Diabetes Mother   . Hyperlipidemia Mother   . Hypertension Mother   . Diabetes Father   . Hyperlipidemia Father   . Hypertension Father     Social History Social History   Tobacco Use  . Smoking status: Never Smoker  . Smokeless tobacco: Never Used  Substance Use Topics  . Alcohol use: No  . Drug use: No     Allergies   Patient has no known allergies.   Review of Systems Review of Systems  All other systems reviewed and are negative.    Physical Exam Updated Vital Signs BP (!) 144/80 (BP Location: Left Arm)   Pulse 90   Temp 98.1 F (36.7 C) (Oral)   Resp 16   SpO2 98%   Physical Exam  Nursing note and vitals reviewed.  57 year old female, resting comfortably and in no acute distress. Vital signs are significant for mildly elevated systolic blood pressure. Oxygen saturation is 98%, which is normal. Head is normocephalic and atraumatic. PERRLA, EOMI. Oropharynx is clear. Neck is nontender and supple without adenopathy or JVD. Back is nontender and there is no CVA tenderness. Lungs are clear without rales, wheezes, or rhonchi. Chest is nontender. Heart has regular rate and rhythm without murmur. Abdomen is soft, flat, nontender without masses or hepatosplenomegaly and peristalsis is  normoactive. Extremities have no cyanosis or edema, full range of motion is present. Skin is warm and dry without rash. Neurologic: Mental status is normal, cranial nerves are intact, there are no motor or sensory deficits.  ED Treatments / Results  Labs (all labs ordered are listed, but only abnormal results are displayed) Labs Reviewed  URINALYSIS, ROUTINE W REFLEX MICROSCOPIC - Abnormal; Notable for the following components:      Result Value   APPearance CLOUDY (*)    Specific Gravity, Urine >1.030 (*)    Hgb urine dipstick LARGE (*)    All other components within normal limits  URINALYSIS, MICROSCOPIC (REFLEX) - Abnormal; Notable for the following components:   Bacteria, UA RARE (*)  All other components within normal limits   Radiology Ct Renal Stone Study  Result Date: 12/20/2017 CLINICAL DATA:  Acute onset of left flank pain. Increased urinary urgency. EXAM: CT ABDOMEN AND PELVIS WITHOUT CONTRAST TECHNIQUE: Multidetector CT imaging of the abdomen and pelvis was performed following the standard protocol without IV contrast. COMPARISON:  CT of the abdomen and pelvis performed 11/22/2009, and abdominal radiograph performed 12/16/2009 FINDINGS: Lower chest: The visualized lung bases are grossly clear. The visualized portions of the mediastinum are unremarkable. Hepatobiliary: The liver is unremarkable in appearance. A stone is seen dependently within the gallbladder. The gallbladder is otherwise unremarkable. The common bile duct remains normal in caliber. Pancreas: The pancreas is within normal limits. Spleen: Scattered calcified granulomata are seen within the spleen. Adrenals/Urinary Tract: The adrenal glands are unremarkable in appearance. There is minimal left-sided hydronephrosis, with an obstructing 4 mm stone at the left side of the base of the bladder, along the left vesicoureteral junction. Minimal left-sided perinephric stranding is seen. A few tiny nonobstructing left renal  stones are noted. The right kidney is unremarkable in appearance. Stomach/Bowel: The stomach is unremarkable in appearance. The small bowel is within normal limits. The appendix is normal in caliber, without evidence of appendicitis. The colon is unremarkable in appearance. Vascular/Lymphatic: The abdominal aorta is unremarkable in appearance. The inferior vena cava is grossly unremarkable. No retroperitoneal lymphadenopathy is seen. No pelvic sidewall lymphadenopathy is identified. Reproductive: The bladder is decompressed and not well characterized. A small calcified fibroid is noted at the right side of the uterus. The ovaries are grossly symmetric. No suspicious adnexal masses are seen. Other: No additional soft tissue abnormalities are seen. Musculoskeletal: No acute osseous abnormalities are identified. The visualized musculature is unremarkable in appearance. IMPRESSION: 1. Minimal left-sided hydronephrosis, with an obstructing 4 mm stone noted at the left side of the base of the bladder, along the left vesicoureteral junction. 2. Few tiny nonobstructing left renal stones noted. 3. Cholelithiasis; gallbladder otherwise unremarkable in appearance. 4. Small calcified uterine fibroid noted. Electronically Signed   By: Roanna Raider M.D.   On: 12/20/2017 06:49    Procedures Procedures   Medications Ordered in ED Medications  naproxen (NAPROSYN) tablet 500 mg (500 mg Oral Given 12/20/17 1610)     Initial Impression / Assessment and Plan / ED Course  I have reviewed the triage vital signs and the nursing notes.  Pertinent labs & imaging results that were available during my care of the patient were reviewed by me and considered in my medical decision making (see chart for details).  Flank pain with urinary symptoms.  Possible renal colic, possible urinary tract infection.  Urine has been sent for urinalysis.  Old records are reviewed, and she did have an ED visit for kidney stone in 2011.  6:17  AM Urinalysis shows hematuria without pyuria or bacteria, which is much more consistent with kidney stone.  She will be sent for renal stone protocol CT scan.  7:05 AM CT shows 4 mm calculus in the distal left ureter.  Incidental finding of cholelithiasis which is asymptomatic.  She is discharged with prescriptions for naproxen, tamsulosin, ondansetron, oxycodone-acetaminophen.  Final Clinical Impressions(s) / ED Diagnoses   Final diagnoses:  Ureterolithiasis    ED Discharge Orders        Ordered    oxyCODONE-acetaminophen (PERCOCET) 5-325 MG tablet  Every 4 hours PRN     12/20/17 0705    ondansetron (ZOFRAN) 4 MG tablet  Every 6 hours PRN  12/20/17 0705    tamsulosin (FLOMAX) 0.4 MG CAPS capsule  Daily     12/20/17 0705    naproxen (NAPROSYN) 375 MG tablet  2 times daily     12/20/17 0705       Dione Booze, MD 12/20/17 [phone]

## 2017-12-29 ENCOUNTER — Encounter: Payer: Self-pay | Admitting: Family Medicine

## 2017-12-29 ENCOUNTER — Ambulatory Visit: Payer: 59 | Admitting: Endocrinology

## 2017-12-29 ENCOUNTER — Encounter

## 2017-12-29 ENCOUNTER — Encounter: Payer: Self-pay | Admitting: Endocrinology

## 2017-12-29 DIAGNOSIS — E1165 Type 2 diabetes mellitus with hyperglycemia: Secondary | ICD-10-CM

## 2017-12-29 DIAGNOSIS — E119 Type 2 diabetes mellitus without complications: Secondary | ICD-10-CM | POA: Diagnosis not present

## 2017-12-29 DIAGNOSIS — E1151 Type 2 diabetes mellitus with diabetic peripheral angiopathy without gangrene: Secondary | ICD-10-CM | POA: Diagnosis not present

## 2017-12-29 DIAGNOSIS — IMO0002 Reserved for concepts with insufficient information to code with codable children: Secondary | ICD-10-CM

## 2017-12-29 MED ORDER — SEMAGLUTIDE (1 MG/DOSE) 2 MG/1.5ML ~~LOC~~ SOPN: 1.0000 mg | PEN_INJECTOR | SUBCUTANEOUS | 3 refills | Status: DC

## 2017-12-29 MED ORDER — DAPAGLIFLOZIN PROPANEDIOL 5 MG PO TABS: 5.0000 mg | ORAL_TABLET | Freq: Every day | ORAL | 11 refills | Status: DC

## 2017-12-29 MED ORDER — NATEGLINIDE 120 MG PO TABS: 120.0000 mg | ORAL_TABLET | Freq: Three times a day (TID) | ORAL | 11 refills | Status: DC

## 2017-12-29 MED ORDER — NATEGLINIDE 60 MG PO TABS: ORAL_TABLET | ORAL | 1 refills | Status: DC

## 2017-12-29 NOTE — Progress Notes (Signed)
Subjective:    Patient ID: Dominique ShengPatricia A [name], female    DOB: 02/23/1961, 57 y.o.   MRN: [id number]  HPI pt is referred by Dr Barrett ShellL-C, for diabetes.  Pt states DM was dx'ed in 2016 (she had GDM in 2000); she has mild if any neuropathy of the lower extremities; she is unaware of any associated chronic complications; she has never been on insulin; pt says her diet is good, and exercise is fair; she has never had pancreatitis, pancreatic surgery, severe hypoglycemia or DKA.  She takes Ozempic 0.5 mg/week, and nateglinide 2x60 mg tid.  She did not tolerate trulicity (rash).  She stopped metformin, due to lack of effect.  she brings her meter, with her cbg's which I have reviewed today.  cbg varies from 100's-300.   Past Medical History:  Diagnosis Date  . Arthritis    left foot  . Asthma   . Bell palsy 2015  . Endometriosis   . GERD (gastroesophageal reflux disease)   . H/O hiatal hernia   . Histoplasmosis 2002   [place] of, [place]  . Hypertension   . Kidney stone     Past Surgical History:  Procedure Laterality Date  . laparatomy  1985  . RADIAL HEAD ARTHROPLASTY Left 02/23/2013   Procedure: LEFT RADIAL HEAD ARTHROPLASTY qwith ligament reconstruction;  Surgeon: Dominica SeverinWilliam Gramig, MD;  Location: [facility] OR;  Service: Orthopedics;  Laterality: Left;  . TONSILLECTOMY AND ADENOIDECTOMY  1967  . uterine lining ablation  2002    Social History   Socioeconomic History  . Marital status: Married    Spouse name: Not on file  . Number of children: Not on file  . Years of education: Not on file  . Highest education level: Not on file  Occupational History    Comment: unemployed  Social Needs  . Financial resource strain: Not on file  . Food insecurity:    Worry: Not on file    Inability: Not on file  . Transportation needs:    Medical: Not on file    Non-medical: Not on file  Tobacco Use  . Smoking status: Never Smoker  . Smokeless tobacco: Never Used  Substance and Sexual Activity   . Alcohol use: No  . Drug use: No  . Sexual activity: Yes    Partners: Male  Lifestyle  . Physical activity:    Days per week: Not on file    Minutes per session: Not on file  . Stress: Not on file  Relationships  . Social connections:    Talks on phone: Not on file    Gets together: Not on file    Attends religious service: Not on file    Active member of club or organization: Not on file    Attends meetings of clubs or organizations: Not on file    Relationship status: Not on file  . Intimate partner violence:    Fear of current or ex partner: Not on file    Emotionally abused: Not on file    Physically abused: Not on file    Forced sexual activity: Not on file  Other Topics Concern  . Not on file  Social History Narrative   Exercise-- no    Current Outpatient Medications on File Prior to Visit  Medication Sig Dispense Refill  . albuterol (VENTOLIN HFA) 108 (90 Base) MCG/ACT inhaler Inhale 2 puffs into the lungs every 6 (six) hours as needed for wheezing or shortness of breath. For shortness of breath  3 Inhaler 2  . Ascorbic Acid (VITAMIN C) 1000 MG tablet Take 1,000 mg by mouth daily.    [name]. atorvastatin (LIPITOR) 10 MG tablet Take 1 tablet (10 mg total) by mouth every other day. 45 tablet 2  . beclomethasone (QVAR) 40 MCG/ACT inhaler Inhale 2 puffs into the lungs 2 (two) times daily. 3 Inhaler 3  . fenofibrate 160 MG tablet TAKE ONE TABLET BY MOUTH DAILY 90 tablet 0  . gabapentin (NEURONTIN) 100 MG capsule TAKE 1 CAPSULE(100 MG) BY MOUTH THREE TIMES DAILY 90 capsule 3  . glucose blood test strip ONETOUCH VERIO-CHECK BLOOD SUGAR THREE TIMES A DAY 200 each 12  . Insulin Pen Needle 32G X 4 MM MISC Use with Ozempic once weekly 100 each 0  . levalbuterol (XOPENEX) 1.25 MG/3ML nebulizer solution Take 1.25 mg by nebulization every 6 (six) hours as needed for wheezing or shortness of breath. 3 ml nebs 72 mL 1  . losartan (COZAAR) 100 MG tablet TAKE ONE TABLET BY MOUTH DAILY 90  tablet 0  . montelukast (SINGULAIR) 10 MG tablet Take 1 tablet (10 mg total) by mouth at bedtime. 90 tablet 1   No current facility-administered medications on file prior to visit.     No Known Allergies  Family History  Problem Relation Age of Onset  . Diabetes Mother   . Hyperlipidemia Mother   . Hypertension Mother   . Diabetes Father   . Hyperlipidemia Father   . Hypertension Father     BP 122/64 (BP Location: Left Arm, Patient Position: Sitting, Cuff Size: Normal)   Pulse 94   Ht 5\' 3"  (1.6 m)   Wt 154 lb (69.9 kg)   SpO2 98%   BMI 27.28 kg/m     Review of Systems denies blurry vision, headache, chest pain, n/v, urinary frequency, muscle cramps, excessive diaphoresis, memory loss, depression, cold intolerance, rhinorrhea, and easy bruising.  She has ongoing right facial droop (Bell's Palsy).  She has lost a few lbs.  She has chronic sob, due to asthma.      Objective:   Physical Exam VS: see vs page GEN: no distress HEAD: head: no deformity eyes: no periorbital swelling, no proptosis external nose and ears are normal mouth: no lesion seen NECK: supple, thyroid is not enlarged CHEST WALL: no deformity LUNGS: clear to auscultation CV: reg rate and rhythm, no murmur ABD: abdomen is soft, nontender.  no hepatosplenomegaly.  not distended.  no hernia MUSCULOSKELETAL: muscle bulk and strength are grossly normal.  no obvious joint swelling.  gait is normal and steady EXTEMITIES: no deformity.  no ulcer on the feet.  feet are of normal color and temp.  no edema.  bilat heavy calluses PULSES: dorsalis pedis intact bilat.  no carotid bruit NEURO:  cn 2-12 grossly intact, except for right facial droop.   readily moves all 4's.  sensation is intact to touch on the feet SKIN:  Normal texture and temperature.  No rash or suspicious lesion is visible.   NODES:  None palpable at the neck PSYCH: alert, well-oriented.  Does not appear anxious nor depressed.   outside test  results are reviewed:  A1c=8.9%  I have reviewed outside records, and summarized: Pt was noted to have elevated a1c, and referred here.  She had Bell's palsy, but continued to have facial and ear pain      Assessment & Plan:  Type 2 DM: she needs increased rx Rash: this limits rx options  Patient Instructions  good  diet and exercise significantly improve the control of your diabetes.  please let me know if you wish to be referred to a dietician.  high blood sugar is very risky to your health.  you should see an eye doctor and dentist every year.  It is very important to get all recommended vaccinations.  Controlling your blood pressure and cholesterol drastically reduces the damage diabetes does to your body.  Those who smoke should quit.  Please discuss these with your doctor.  I have sent a prescription to your pharmacy, to add "Farxiga," and to double the ozempic. Please call or message [place] next week, to tell us how the blood sugar is doing.  We can increase the farxiga, or change the nateglinide to repaglinide.   check your blood sugar once a day.  vary the time of day when you check, between before the 3 meals, and at bedtime.  also check if you have symptoms of your blood sugar being too high or too low.  please keep a record of the readings and bring it to your next appointment here (or you can bring the meter itself).  You can write it on any piece of paper.  please call us sooner if your blood sugar goes below 70, or if you have a lot of readings over 200.  Please come back for a follow-up appointment in 2 months.

## 2017-12-29 NOTE — Patient Instructions (Addendum)
good diet and exercise significantly improve the control of your diabetes.  please let me know if you wish to be referred to a dietician.  high blood sugar is very risky to your health.  you should see an eye doctor and dentist every year.  It is very important to get all recommended vaccinations.  Controlling your blood pressure and cholesterol drastically reduces the damage diabetes does to your body.  Those who smoke should quit.  Please discuss these with your doctor.  I have sent a prescription to your pharmacy, to add "Farxiga," and to double the ozempic. Please call or message us next week, to tell us how the blood sugar is doing.  We can increase the farxiga, or change the nateglinide to repaglinide.   check your blood sugar once a day.  vary the time of day when you check, between before the 3 meals, and at bedtime.  also check if you have symptoms of your blood sugar being too high or too low.  please keep a record of the readings and bring it to your next appointment here (or you can bring the meter itself).  You can write it on any piece of paper.  please call us sooner if your blood sugar goes below 70, or if you have a lot of readings over 200.  Please come back for a follow-up appointment in 2 months.

## 2018-01-02 ENCOUNTER — Ambulatory Visit: Payer: 59 | Admitting: Endocrinology

## 2018-01-02 NOTE — Telephone Encounter (Signed)
I have looked back in your chart---I thought maybe it was on an xray or something but I don't see where that came from  Sometimes we see atherosclerosis of aorta on chest xray but I went back a ways and did not see that either If it was on your problem list and he took it out that may be why I don't see it now

## 2018-01-03 ENCOUNTER — Other Ambulatory Visit: Payer: Self-pay

## 2018-01-03 ENCOUNTER — Telehealth: Payer: Self-pay | Admitting: Endocrinology

## 2018-01-03 MED ORDER — ACCU-CHEK MULTICLIX LANCETS MISC: 3 refills | Status: DC

## 2018-01-03 MED ORDER — GLUCOSE BLOOD VI STRP: ORAL_STRIP | 3 refills | Status: DC

## 2018-01-03 MED ORDER — GLUCOSE BLOOD VI STRP: ORAL_STRIP | 12 refills | Status: DC

## 2018-01-03 MED ORDER — ONETOUCH VERIO W/DEVICE KIT: 1.0000 | PACK | Freq: Every day | 0 refills | Status: DC

## 2018-01-03 MED ORDER — ONETOUCH ULTRASOFT LANCETS MISC: 12 refills | Status: DC

## 2018-01-03 NOTE — Telephone Encounter (Signed)
Can we switch to jardiance? Patient says that what insurance prefers.

## 2018-01-03 NOTE — Telephone Encounter (Signed)
I have changed to one touch verio for patient & sent all required to pharmacy.

## 2018-01-03 NOTE — Telephone Encounter (Signed)
Patient was given an Accucheck Guide Me Meter by DR. Everardo AllEllison. Patient called for lancets and test strips but her insurance does not cover them. Insurance will cover One Touch Ultra Blue or One Touch Verio, so patient needs to change meters. Old meter is reading 30-40 points higher than it should be. Please call patient at ph#[phone]. Re; Varciga-insurance does not cover unless Dr. Does appeal. Insurance does cover: Imvokamet Invokana Jardiance Synjardy  90 day RX's are best

## 2018-01-03 NOTE — Telephone Encounter (Signed)
dapagliflozin propanediol (FARXIGA) 5 MG TABS tablet    Patient stated that this medication is waiting on approval to be filled.    Patient would also like a 90 day supply sent in for her test strips and lancets (accu check guide)      [id number] - [place], [place] - [address]

## 2018-01-03 NOTE — Telephone Encounter (Signed)
Can Marcelline DeistFarxiga be changed? I have not received a PA for this? I have sent prescriptions.

## 2018-01-03 NOTE — Telephone Encounter (Signed)
If ins covers jardiance or invokana, I would be happy to change.

## 2018-01-04 ENCOUNTER — Other Ambulatory Visit: Payer: Self-pay | Admitting: Endocrinology

## 2018-01-04 MED ORDER — REPAGLINIDE 1 MG PO TABS: 1.0000 mg | ORAL_TABLET | Freq: Three times a day (TID) | ORAL | 11 refills | Status: DC

## 2018-01-05 ENCOUNTER — Telehealth: Payer: Self-pay

## 2018-01-05 ENCOUNTER — Telehealth: Payer: Self-pay | Admitting: Endocrinology

## 2018-01-05 ENCOUNTER — Other Ambulatory Visit: Payer: Self-pay

## 2018-01-05 MED ORDER — EMPAGLIFLOZIN 10 MG PO TABS: 10.0000 mg | ORAL_TABLET | Freq: Every day | ORAL | 3 refills | Status: DC

## 2018-01-05 NOTE — Telephone Encounter (Signed)
-----   Message from Davis GourdSarah E Terrell, CMA sent at 01/05/2018  2:26 PM EDT ----- Regarding: Jardiance Patient's insurance prefers [place] PepperJardiance can we switch Marcelline DeistFarxiga?

## 2018-01-05 NOTE — Telephone Encounter (Signed)
Ok, I changed and signed rx

## 2018-01-05 NOTE — Telephone Encounter (Signed)
-----   Message from Davis GourdSarah E Terrell, CMA sent at 01/05/2018  2:28 PM EDT ----- Regarding: Pharmacy change! Also if Glori BickersFariga is switched to [place] [place]!

## 2018-01-05 NOTE — Telephone Encounter (Signed)
Ok, I sent rx 

## 2018-01-05 NOTE — Telephone Encounter (Signed)
I called and canceled repaglinide prescription for patient & [facility] stated that [facility] [place] had already asked for a transfer. I also spoke with patient who wanted me to make sure prescription went [facility].

## 2018-01-10 ENCOUNTER — Telehealth: Payer: Self-pay | Admitting: Family Medicine

## 2018-01-10 NOTE — Telephone Encounter (Unsigned)
Copied from CRM [id number]. Topic: Quick Communication - See Telephone Encounter >> Jan 10, 2018 12:32 PM Raquel SarnaHayes, Teresa G wrote: Pt needing to speak with Dr. Ernst SpellLowne's nurse regarding the appt w/ Dr. Everardo AllEllison - endocrinologist. Pt stating it was a horrible experience and was told of harding of the arteries.  Pt needing a call back to discuss questions and a new referral.

## 2018-01-12 ENCOUNTER — Telehealth: Payer: Self-pay | Admitting: Family Medicine

## 2018-01-12 NOTE — Telephone Encounter (Unsigned)
Copied from CRM [id number]. Topic: Quick Communication - See Telephone Encounter >> Jan 10, 2018 12:32 PM Raquel SarnaHayes, Teresa G wrote: Pt needing to speak with Dr. Ernst SpellLowne's nurse regarding the appt w/ Dr. Everardo AllEllison - endocrinologist. Pt stating it was a horrible experience and was told of hardening of the arteries.  Pt needing a call back to discuss questions and a new referral.

## 2018-01-16 NOTE — Telephone Encounter (Signed)
Patient advised that she was told by endocrinologist that she had harding of the arteries.  Per Lowne and I we did not see it listed in her chart.  She would like to see another provider preferrbly a female.  Per Dr. Laury Axon patient can request female or we can just sent to another office.  She also asked about medication that covers her kidneys.  Per Dr. Laury Axon she is on losartan which covers her kidneys.  Dr. Laury Axon does recommend that she follow up with urology.

## 2018-01-18 NOTE — Telephone Encounter (Signed)
Patient notified of notes

## 2018-01-19 ENCOUNTER — Other Ambulatory Visit: Payer: Self-pay | Admitting: Family Medicine

## 2018-01-19 DIAGNOSIS — I10 Essential (primary) hypertension: Secondary | ICD-10-CM

## 2018-01-19 DIAGNOSIS — E785 Hyperlipidemia, unspecified: Secondary | ICD-10-CM

## 2018-01-22 ENCOUNTER — Telehealth: Payer: Self-pay

## 2018-01-22 NOTE — Telephone Encounter (Signed)
jardiance approved for 1 year through 01/19/2019 [id number]

## 2018-02-16 ENCOUNTER — Encounter: Payer: Self-pay | Admitting: Family Medicine

## 2018-02-16 DIAGNOSIS — J45909 Unspecified asthma, uncomplicated: Secondary | ICD-10-CM

## 2018-02-16 DIAGNOSIS — J302 Other seasonal allergic rhinitis: Secondary | ICD-10-CM

## 2018-02-17 DIAGNOSIS — Z23 Encounter for immunization: Secondary | ICD-10-CM | POA: Diagnosis not present

## 2018-02-19 ENCOUNTER — Other Ambulatory Visit: Payer: Self-pay | Admitting: Family Medicine

## 2018-02-19 DIAGNOSIS — J45909 Unspecified asthma, uncomplicated: Secondary | ICD-10-CM

## 2018-02-19 MED ORDER — MONTELUKAST SODIUM 10 MG PO TABS: 10.0000 mg | ORAL_TABLET | Freq: Every day | ORAL | 0 refills | Status: DC

## 2018-02-19 MED ORDER — ALBUTEROL SULFATE HFA 108 (90 BASE) MCG/ACT IN AERS: 2.0000 | INHALATION_SPRAY | Freq: Four times a day (QID) | RESPIRATORY_TRACT | 0 refills | Status: DC | PRN

## 2018-02-19 MED ORDER — LEVALBUTEROL HCL 1.25 MG/3ML IN NEBU: 1.2500 mg | INHALATION_SOLUTION | Freq: Four times a day (QID) | RESPIRATORY_TRACT | 0 refills | Status: DC | PRN

## 2018-02-19 MED ORDER — FLUTICASONE PROPIONATE HFA 110 MCG/ACT IN AERO: 2.0000 | INHALATION_SPRAY | Freq: Two times a day (BID) | RESPIRATORY_TRACT | 2 refills | Status: DC

## 2018-02-19 NOTE — Telephone Encounter (Signed)
Can you send in something else for QVAR, ins will not allow?

## 2018-02-19 NOTE — Telephone Encounter (Signed)
Try flovent 110  2 puffs bid  #1  2 refills

## 2018-02-22 ENCOUNTER — Ambulatory Visit: Payer: 59 | Admitting: Internal Medicine

## 2018-02-22 ENCOUNTER — Encounter: Payer: Self-pay | Admitting: Internal Medicine

## 2018-02-22 VITALS — BP 120/70 | HR 92 | Ht 63.0 in | Wt 151.0 lb

## 2018-02-22 DIAGNOSIS — E1165 Type 2 diabetes mellitus with hyperglycemia: Secondary | ICD-10-CM

## 2018-02-22 LAB — POCT GLYCOSYLATED HEMOGLOBIN (HGB A1C): HEMOGLOBIN A1C: 6.5 % — AB (ref 4.0–5.6)

## 2018-02-22 MED ORDER — ONETOUCH DELICA LANCETS 33G MISC: 3 refills | Status: DC

## 2018-02-22 MED ORDER — EMPAGLIFLOZIN 10 MG PO TABS: 10.0000 mg | ORAL_TABLET | Freq: Every day | ORAL | 3 refills | Status: DC

## 2018-02-22 MED ORDER — SEMAGLUTIDE (1 MG/DOSE) 2 MG/1.5ML ~~LOC~~ SOPN: 1.0000 mg | PEN_INJECTOR | SUBCUTANEOUS | 3 refills | Status: DC

## 2018-02-22 MED ORDER — REPAGLINIDE 1 MG PO TABS: 1.0000 mg | ORAL_TABLET | Freq: Three times a day (TID) | ORAL | 11 refills | Status: DC

## 2018-02-22 MED ORDER — GLUCOSE BLOOD VI STRP: ORAL_STRIP | 12 refills | Status: DC

## 2018-02-22 NOTE — Progress Notes (Addendum)
Patient ID: Dominique [name], female   DOB: May 18, 1960, 57 y.o.   MRN: [id number]   HPI: Dominique [name] is a 57 y.o.-year-old female, referred by her PCP, Dr. Carollee [name], for management of DM2, dx in 2016,, but GDM in 2000 non-insulin-dependent, uncontrolled, without long term complications.  She previously saw Dr. Loanne [name], last visit 2 months ago.  She is here with her husband who offers part of the history especially related to her past medical history and symptoms.  Last hemoglobin A1c was: HbA1c 8.9% Lab Results  Component Value Date   HGBA1C 7.5 06/20/2017   HGBA1C 6.4 (A) 12/09/2014   HGBA1C 11.9 (A) 03/24/2014   Pt is on a regimen of: -Nateglinide 2x 60 mg 3x a day -Repaginide 1 mg 3x a day - added by Dr Dominique [name] -Ozempic 0.5 >> 1 mg weekly -Farxiga 5 mg >> Jardiance 10 mg before breakfast per insurance preference (PA was approved) She stopped metformin due to lack of effect. She developed a rash with Trulicity  Pt checks her sugars 2x a day and they are: - am: 106-140, 159 - 2h after b'fast: 78, 152 - before lunch: 97 - 2h after lunch: 102-132 - before dinner: 147 - 2h after dinner: 132-175, 199 - bedtime: n/c - nighttime: n/c No lows. Lowest sugar was 78; she has hypoglycemia awareness at 90.  Highest sugar was 199.  Glucometer: Accu-Chek guide  Pt's meals are: - Breakfast: 1-2 egg, 1-2 toast; egg + bacon - Lunch: eating out, salad, meat + veggies - Dinner: eating out, salad, meat + veggies - Dessert: apple pie, cake - Snacks: chips  - no CKD, last BUN/creatinine:  Lab Results  Component Value Date   BUN 9 06/20/2017   BUN 14 03/24/2014   CREATININE 0.8 06/20/2017   CREATININE 0.9 12/09/2014  On Losartan 100.  - last set of lipids: Lab Results  Component Value Date   CHOL 112 06/20/2017   HDL 42 06/20/2017   LDLCALC 53 06/20/2017   TRIG 85 06/20/2017   CHOLHDL 3.6 03/30/2015  On fenofibrate 160 mg daily and Lipitor 10 mg daily.  - last eye  exam was in 01-06/2017. No DR.   - no numbness and tingling in her feet.  On Gabapentin for Bell's palsy. Has a h/o kidney stone during the summer.  Pt has FH of DM in mother and father.  ROS: Constitutional: + Weight loss, no fatigue, no subjective hyperthermia/hypothermia Eyes: no blurry vision, no xerophthalmia ENT: no sore throat, no nodules palpated in throat, no dysphagia/odynophagia, + hoarseness Cardiovascular: no CP/+ SOB/no palpitations/leg swelling Respiratory: no cough/+ SOB Gastrointestinal: no N/V/D/C/+ heartburn Musculoskeletal: no muscle/joint aches Skin: no rashes Neurological: no tremors/numbness/tingling/dizziness Psychiatric: no depression/anxiety  Past Medical History:  Diagnosis Date  . Arthritis    left foot  . Asthma   . Bell palsy 2015  . Endometriosis   . GERD (gastroesophageal reflux disease)   . H/O hiatal hernia   . 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 Hypertension   . Kidney stone    Past Surgical History:  Procedure Laterality Date  . laparatomy  1985  . RADIAL HEAD ARTHROPLASTY Left 02/23/2013   Procedure: LEFT RADIAL HEAD ARTHROPLASTY qwith ligament reconstruction;  Surgeon: Dominique Kaufman, MD;  Location: [facility];  Service: Orthopedics;  Laterality: Left;  . TONSILLECTOMY AND ADENOIDECTOMY  1967  . uterine lining ablation  2002   Social History   Socioeconomic History  . Marital status: Married  Spouse name: Not on file  . Number of children: Not on file  . Years of education: Not on file  . Highest education level: Not on file  Occupational History    Comment: unemployed  Social Needs  . Financial resource strain: Not on file  . Food insecurity:    Worry: Not on file    Inability: Not on file  . Transportation needs:    Medical: Not on file    Non-medical: Not on file  Tobacco Use  . Smoking status: Never Smoker  . Smokeless tobacco: Never Used  Substance and Sexual Activity  . Alcohol use: No  . Drug  use: No  . Sexual activity: Yes    Partners: Male  Lifestyle  . Physical activity:    Days per week: Not on file    Minutes per session: Not on file  . Stress: Not on file  Relationships  . Social connections:    Talks on phone: Not on file    Gets together: Not on file    Attends religious service: Not on file    Active member of club or organization: Not on file    Attends meetings of clubs or organizations: Not on file    Relationship status: Not on file  . Intimate partner violence:    Fear of current or ex partner: Not on file    Emotionally abused: Not on file    Physically abused: Not on file    Forced sexual activity: Not on file  Other Topics Concern  . Not on file  Social History Narrative   Exercise-- no   Current Outpatient Medications on File Prior to Visit  Medication Sig Dispense Refill  . albuterol (VENTOLIN HFA) 108 (90 Base) MCG/ACT inhaler Inhale 2 puffs into the lungs every 6 (six) hours as needed for wheezing or shortness of breath. For shortness of breath 3 Inhaler 0  . Ascorbic Acid (VITAMIN C) 1000 MG tablet Take 1,000 mg by mouth daily.    [name] atorvastatin (LIPITOR) 10 MG tablet Take 1 tablet (10 mg total) by mouth every other day. 45 tablet 2  . Blood Glucose Monitoring Suppl (ONETOUCH VERIO) w/Device KIT 1 Device by Does not apply route daily. 1 kit 0  . empagliflozin (JARDIANCE) 10 MG TABS tablet Take 10 mg by mouth daily. 90 tablet 3  . fenofibrate 160 MG tablet TAKE 1 TABLET BY MOUTH DAILY 90 tablet 0  . fluticasone (FLOVENT HFA) 110 MCG/ACT inhaler Inhale 2 puffs into the lungs 2 (two) times daily. 1 Inhaler 2  . gabapentin (NEURONTIN) 100 MG capsule TAKE 1 CAPSULE(100 MG) BY MOUTH THREE TIMES DAILY 90 capsule 3  . glucose blood (ONETOUCH VERIO) test strip Used to check blood sugars three times a day. 100 each 12  . Insulin Pen Needle 32G X 4 MM MISC Use with Ozempic once weekly 100 each 0  . Lancets (ONETOUCH ULTRASOFT) lancets Used to check blood  sugars three times a day. 100 each 12  . levalbuterol (XOPENEX) 1.25 MG/3ML nebulizer solution USE ONE VIAL BY NEBULIZATION EVERY 6 HOURS AS NEEDED FOR WHEEZING OR SHORTNESS OF BREATH 675 mL 0  . losartan (COZAAR) 100 MG tablet TAKE 1 TABLET BY MOUTH DAILY 90 tablet 0  . montelukast (SINGULAIR) 10 MG tablet Take 1 tablet (10 mg total) by mouth at bedtime. 90 tablet 0  . repaglinide (PRANDIN) 1 MG tablet Take 1 tablet (1 mg total) by mouth 3 (three) times daily before meals.  90 tablet 11  . Semaglutide (OZEMPIC) 1 MG/DOSE SOPN Inject 1 mg into the skin once a week. 12 pen 3   No current facility-administered medications on file prior to visit.    No Known Allergies Family History  Problem Relation Age of Onset  . Diabetes Mother   . Hyperlipidemia Mother   . Hypertension Mother   . Diabetes Father   . Hyperlipidemia Father   . Hypertension Father     PE: BP 120/70   Pulse 92   Ht '5\' 3"'$  (1.6 m)   Wt 151 lb (68.5 kg)   SpO2 97%   BMI 26.75 kg/m  Wt Readings from Last 3 Encounters:  02/22/18 151 lb (68.5 kg)  12/29/17 154 lb (69.9 kg)  07/24/17 155 lb 8 oz (70.5 kg)   Constitutional: Normal weight, in NAD Eyes: PERRLA, EOMI, no exophthalmos ENT: moist mucous membranes, no thyromegaly, no cervical lymphadenopathy Cardiovascular: Tachycardia, RR, No MRG Respiratory: CTA B Gastrointestinal: abdomen soft, NT, ND, BS+ Musculoskeletal: no deformities, strength intact in all 4 Skin: moist, warm, no rashes Neurological: no tremor with outstretched hands, DTR normal in all 4  ASSESSMENT: 1. DM2, non-insulin-dependent, uncontrolled, without long-term complications, but with hyperglycemia  PLAN:  1. Patient with long-standing, uncontrolled diabetes, on oral antidiabetic regimen. Now HbA1c is 6.5% (much better!). - At this visit, we reviewed her medications and she is on 2 meglitinides: She was previously on nateglinide, which Dr. Loanne [name] wanted to switch to repaglinide, but there was  miscommunication and she is not taking both.  She is also on Ozempic, does increase 12/2017 and Jardiance 10 mg daily added 12/2017.  She has no side effects from the above medicines, but she does admit that sometimes she drops her sugars too low midday, in the 70s.  She also has increased urination but is aware that this can be a side effect of the SGLT2 inhibitors and is trying to drink plenty of water. - At this visit we will check a BMP now on Jardiance - We discussed about the fact that using 2 meglitinides can increase her risk of hypoglycemia.  Since her sugars are now at or close to goal, will try to stop 1 of them: Nateglinide, and just follow the sugars for now.  If they start to increase, we discussed about starting back metformin.  She agrees with the plan.  She was concerned about possible side effects of metformin, but I reassured her that metformin is one of the best drugs we have for diabetes and it has remarkably little side effects. - She is preparing for bunion surgery and I explained that based on the HbA1c, she is cleared for surgery from the diabetes point of view. - We discussed about the referral to nutrition.  She has seen a nutritionist but 20 years ago.  She will think about it and let me know if she wants to see a nutritionist again. - I suggested to:  Patient Instructions  Please stop Nateglinide (Starlix).  Please continue: - Repaglinide (Prandin) 1 mg 3x a day before meals - Jardiance 10 mg before b'fast - Ozempic 1 mg weekly  Please let me know if the sugars are consistently <80 or >200.  Please come back for a follow-up appointment in 3 months.  - Strongly advised her to start checking sugars at different times of the day - check 1-3x a day, rotating checks - given sugar log and advised how to fill it and to bring it at next  appt  - given foot care handout and explained the principles  - given instructions for hypoglycemia management "15-15 rule"  - advised for  yearly eye exams  - UTD with flu shot - Refilled all her diabetic prescriptions - Return to clinic in 3 mo with sugar log   LABS NEED TO GO TO [facility] -patient's husband works there.  - time spent with the patient and her husband: 40 min, of which >50% was spent in obtaining information about her symptoms, reviewing her previous labs, evaluations, and treatments, counseling her about her condition (please see the discussed topics above), and developing a plan to further investigate and treat it; she had a number of questions which I addressed.  Component     Latest Ref Rng & Units 02/22/2018  Glucose     65 - 99 mg/dL 117 (H)  BUN     6 - 24 mg/dL 15  Creatinine     0.57 - 1.00 mg/dL 0.83  GFR, Est Non African American     >59 mL/min/1.73 77  GFR, Est African American     >59 mL/min/1.73 89  BUN/Creatinine Ratio     9 - 23 18  Sodium     134 - 144 mmol/L 142  Potassium     3.5 - 5.2 mmol/L 4.2  Chloride     96 - 106 mmol/L 104  CO2     20 - 29 mmol/L 22  Calcium     8.7 - 10.2 mg/dL 10.1  Kidney function stable and potassium not high.  We can continue with Jardiance.  Philemon Kingdom, MD PhD [facility] Endocrinology

## 2018-02-22 NOTE — Patient Instructions (Addendum)
Please stop Nateglinide (Starlix).  Please continue: - Repaglinide (Prandin) 1 mg 3x a day before meals - Jardiance 10 mg before b'fast - Ozempic 1 mg weekly  Please let me know if the sugars are consistently <80 or >200.  Please come back for a follow-up appointment in 3 months.  PATIENT INSTRUCTIONS FOR TYPE 2 DIABETES:  **Please join MyChart!** - see attached instructions about how to join if you have not done so already.  DIET AND EXERCISE Diet and exercise is an important part of diabetic treatment.  We recommended aerobic exercise in the form of brisk walking (working between 40-60% of maximal aerobic capacity, similar to brisk walking) for 150 minutes per week (such as 30 minutes five days per week) along with 3 times per week performing 'resistance' training (using various gauge rubber tubes with handles) 5-10 exercises involving the major muscle groups (upper body, lower body and core) performing 10-15 repetitions (or near fatigue) each exercise. Start at half the above goal but build slowly to reach the above goals. If limited by weight, joint pain, or disability, we recommend daily walking in a swimming pool with water up to waist to reduce pressure from joints while allow for adequate exercise.    BLOOD GLUCOSES Monitoring your blood glucoses is important for continued management of your diabetes. Please check your blood glucoses 2-4 times a day: fasting, before meals and at bedtime (you can rotate these measurements - e.g. one day check before the 3 meals, the next day check before 2 of the meals and before bedtime, etc.).   HYPOGLYCEMIA (low blood sugar) Hypoglycemia is usually a reaction to not eating, exercising, or taking too much insulin/ other diabetes drugs.  Symptoms include tremors, sweating, hunger, confusion, headache, etc. Treat IMMEDIATELY with 15 grams of Carbs: . 4 glucose tablets .  cup regular juice/soda . 2 tablespoons raisins . 4 teaspoons sugar . 1  tablespoon honey Recheck blood glucose in 15 mins and repeat above if still symptomatic/blood glucose <100.  RECOMMENDATIONS TO REDUCE YOUR RISK OF DIABETIC COMPLICATIONS: * Take your prescribed MEDICATION(S) * Follow a DIABETIC diet: Complex carbs, fiber rich foods, (monounsaturated and polyunsaturated) fats * AVOID saturated/trans fats, high fat foods, >2,300 mg salt per day. * EXERCISE at least 5 times a week for 30 minutes or preferably daily.  * DO NOT SMOKE OR DRINK more than 1 drink a day. * Check your FEET every day. Do not wear tightfitting shoes. Contact us if you develop an ulcer * See your EYE doctor once a year or more if needed * Get a FLU shot once a year * Get a PNEUMONIA vaccine once before and once after age 62 years  GOALS:  * Your Hemoglobin A1c of <7%  * fasting sugars need to be <130 * after meals sugars need to be <180 (2h after you start eating) * Your Systolic BP should be 140 or lower  * Your Diastolic BP should be 80 or lower  * Your HDL (Good Cholesterol) should be 40 or higher  * Your LDL (Bad Cholesterol) should be 100 or lower. * Your Triglycerides should be 150 or lower  * Your Urine microalbumin (kidney function) should be <30 * Your Body Mass Index should be 25 or lower    Please consider the following ways to cut down carbs and fat and increase fiber and micronutrients in your diet: - substitute whole grain for white bread or pasta - substitute brown rice for white rice - substitute  90-calorie flat bread pieces for slices of bread when possible - substitute sweet potatoes or yams for white potatoes - substitute humus for margarine - substitute tofu for cheese when possible - substitute almond or rice milk for regular milk (would not drink soy milk daily due to concern for soy estrogen influence on breast cancer risk) - substitute dark chocolate for other sweets when possible - substitute water - can add lemon or orange slices for taste - for diet  sodas (artificial sweeteners will trick your body that you can eat sweets without getting calories and will lead you to overeating and weight gain in the long run) - do not skip breakfast or other meals (this will slow down the metabolism and will result in more weight gain over time)  - can try smoothies made from fruit and almond/rice milk in am instead of regular breakfast - can also try old-fashioned (not instant) oatmeal made with almond/rice milk in am - order the dressing on the side when eating salad at a restaurant (pour less than half of the dressing on the salad) - eat as little meat as possible - can try juicing, but should not forget that juicing will get rid of the fiber, so would alternate with eating raw veg./fruits or drinking smoothies - use as little oil as possible, even when using olive oil - can dress a salad with a mix of balsamic vinegar and lemon juice, for e.g. - use agave nectar, stevia sugar, or regular sugar rather than artificial sweateners - steam or broil/roast veggies  - snack on veggies/fruit/nuts (unsalted, preferably) when possible, rather than processed foods - reduce or eliminate aspartame in diet (it is in diet sodas, chewing gum, etc) Read the labels!  Try to read Dr. Katherina Right book: "Program for Reversing Diabetes" for other ideas for healthy eating.

## 2018-02-23 LAB — BASIC METABOLIC PANEL
BUN / CREAT RATIO: 18 (ref 9–23)
BUN: 15 mg/dL (ref 6–24)
CO2: 22 mmol/L (ref 20–29)
CREATININE: 0.83 mg/dL (ref 0.57–1.00)
Calcium: 10.1 mg/dL (ref 8.7–10.2)
Chloride: 104 mmol/L (ref 96–106)
GFR calc Af Amer: 89 mL/min/{1.73_m2} (ref >59)
GFR, EST NON AFRICAN AMERICAN: 77 mL/min/{1.73_m2} (ref >59)
GLUCOSE: 117 mg/dL — AB (ref 65–99)
POTASSIUM: 4.2 mmol/L (ref 3.5–5.2)
Sodium: 142 mmol/L (ref 134–144)

## 2018-03-01 ENCOUNTER — Ambulatory Visit: Payer: 59 | Admitting: Endocrinology

## 2018-03-08 ENCOUNTER — Other Ambulatory Visit: Payer: Self-pay | Admitting: Neurology

## 2018-04-11 ENCOUNTER — Telehealth: Payer: Self-pay | Admitting: *Deleted

## 2018-04-11 NOTE — Telephone Encounter (Signed)
I called pt and no answer or phone disconneted (silence).  Attempting to call pt and make appt for future refills.

## 2018-04-15 ENCOUNTER — Other Ambulatory Visit: Payer: Self-pay | Admitting: Family Medicine

## 2018-04-15 DIAGNOSIS — E785 Hyperlipidemia, unspecified: Secondary | ICD-10-CM

## 2018-04-15 DIAGNOSIS — I10 Essential (primary) hypertension: Secondary | ICD-10-CM

## 2018-04-16 MED ORDER — GABAPENTIN 100 MG PO CAPS: 100.0000 mg | ORAL_CAPSULE | Freq: Three times a day (TID) | ORAL | 0 refills | Status: DC

## 2018-04-16 NOTE — Telephone Encounter (Signed)
Spoke to pt and she is doing ok, taking 1oomg po tid.  She still has stiffness/ pressure feeling but is tolerable. (increase at all? To help with this feeling?)  Ok to see NP? I can make appt if ok,  Would refill for 90 days in the interim (see back in 07/2018?).

## 2018-04-17 NOTE — Telephone Encounter (Signed)
Made appt for Dominique SagoSarah NP/Willis for annual RV for refill gabapentin.

## 2018-06-13 ENCOUNTER — Ambulatory Visit: Payer: 59 | Admitting: Internal Medicine

## 2018-06-13 ENCOUNTER — Encounter: Payer: Self-pay | Admitting: Internal Medicine

## 2018-06-13 VITALS — BP 110/68 | HR 90 | Ht 63.0 in | Wt 150.0 lb

## 2018-06-13 DIAGNOSIS — E1165 Type 2 diabetes mellitus with hyperglycemia: Secondary | ICD-10-CM | POA: Diagnosis not present

## 2018-06-13 DIAGNOSIS — B373 Candidiasis of vulva and vagina: Secondary | ICD-10-CM

## 2018-06-13 DIAGNOSIS — B3731 Acute candidiasis of vulva and vagina: Secondary | ICD-10-CM

## 2018-06-13 DIAGNOSIS — E785 Hyperlipidemia, unspecified: Secondary | ICD-10-CM | POA: Diagnosis not present

## 2018-06-13 LAB — POCT GLYCOSYLATED HEMOGLOBIN (HGB A1C): HEMOGLOBIN A1C: 5.9 % — AB (ref 4.0–5.6)

## 2018-06-13 MED ORDER — FLUCONAZOLE 150 MG PO TABS: 150.0000 mg | ORAL_TABLET | Freq: Once | ORAL | 1 refills | Status: AC

## 2018-06-13 NOTE — Patient Instructions (Addendum)
Please continue: - Jardiance 10 mg before b'fast - Ozempic 1 mg weekly  Please stop Prandin.  Take Diflucan 1 tab, and repeat in 5 days if you still have itching.  Please come back for a follow-up appointment in 4 months.

## 2018-06-13 NOTE — Progress Notes (Signed)
Patient ID: Dominique [name], female   DOB: Nov 22, 1960, 58 y.o.   MRN: [id number]   HPI: Dominique [name] is a 58 y.o.-year-old female, returning for follow-up for DM2, dx in 2016, but GDM in 2000 non-insulin-dependent, uncontrolled, without long term complications.  She previously saw Dr. Loanne Drilling.  Last visit with me 3 months ago.    Last hemoglobin A1c was: Lab Results  Component Value Date   HGBA1C 6.5 (A) 02/22/2018   HGBA1C 7.5 06/20/2017   HGBA1C 6.4 (A) 12/09/2014   HGBA1C 11.9 (A) 03/24/2014  HbA1c 8.9%  Pt is on a regimen of:  stopped at last visit -Repaginide 1 mg 3x a day - added by Dr Loanne Drilling - missing doses esp. lunchtime -Ozempic 0.5 >> 1 mg weekly -Farxiga 5 mg >> Jardiance 10 mg before breakfast per insurance preference (PA was approved) >> has vaginal itching - uses a OTC cream She stopped metformin due to lack of effect. She developed a rash with Trulicity  Pt checks her sugars 1x a day: - am: 106-140, 159 >> 86-140, 149 - 2h after b'fast: 78, 152 >> n/c - before lunch: 97 >> 105 - 2h after lunch: 102-132 >> n/c - before dinner: 147 >> n/c - 2h after dinner: 132-175, 199 >> n/c - bedtime: n/c - nighttime: n/c Lowest sugar was 78 >> 86; she has hypoglycemia awareness at 90.  Highest sugar was 199 >> 149.  Glucometer: Accu-Chek guide  Pt's meals are: - Breakfast: 1-2 egg, 1-2 toast; egg + bacon - Lunch: eating out, salad, meat + veggies - Dinner: eating out, salad, meat + veggies - Dessert: apple pie, cake - Snacks: chips  -No CKD, last BUN/creatinine:  Lab Results  Component Value Date   BUN 15 02/22/2018   BUN 9 06/20/2017   CREATININE 0.83 02/22/2018   CREATININE 0.8 06/20/2017  On losartan 100.  -+ HL; last set of lipids: Lab Results  Component Value Date   CHOL 112 06/20/2017   HDL 42 06/20/2017   LDLCALC 53 06/20/2017   TRIG 85 06/20/2017   CHOLHDL 3.6 03/30/2015  On fenofibrate 160 and Lipitor 10.  - last eye exam was in 0 1-0  06/2017: No DR  -She denies numbness and tingling in her feet.  On Gabapentin for Bell's palsy. He had a kidney stone last summer.  Pt has FH of DM in mother and father.  ROS: Constitutional: no weight gain/no weight loss, no fatigue, no subjective hyperthermia, no subjective hypothermia, + burning wit urination Eyes: no blurry vision, no xerophthalmia ENT: no sore throat, no nodules palpated in neck, no dysphagia, no odynophagia, no hoarseness Cardiovascular: no CP/no SOB/no palpitations/no leg swelling Respiratory:+ cough (asthma)/no SOB/no wheezing Gastrointestinal: no N/no V/no D/no C/no acid reflux Musculoskeletal: no muscle aches/no joint aches Skin: no rashes, no hair loss Neurological: no tremors/no numbness/no tingling/no dizziness, + HA  I reviewed pt's medications, allergies, PMH, social hx, family hx, and changes were documented in the history of present illness. Otherwise, unchanged from my initial visit note.  Past Medical History:  Diagnosis Date  . Arthritis    left foot  . Asthma   . Bell palsy 2015  . Endometriosis   . GERD (gastroesophageal reflux disease)   . H/O hiatal hernia   . 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 Hypertension   . Kidney stone    Past Surgical History:  Procedure Laterality Date  . laparatomy  1985  . RADIAL HEAD ARTHROPLASTY  Left 02/23/2013   Procedure: LEFT RADIAL HEAD ARTHROPLASTY qwith ligament reconstruction;  Surgeon: Roseanne Kaufman, MD;  Location: [facility];  Service: Orthopedics;  Laterality: Left;  . TONSILLECTOMY AND ADENOIDECTOMY  1967  . uterine lining ablation  2002   Social History   Socioeconomic History  . Marital status: Married    Spouse name: Not on file  . Number of children: Not on file  . Years of education: Not on file  . Highest education level: Not on file  Occupational History    Comment: unemployed  Social Needs  . Financial resource strain: Not on file  . Food insecurity:    Worry:  Not on file    Inability: Not on file  . Transportation needs:    Medical: Not on file    Non-medical: Not on file  Tobacco Use  . Smoking status: Never Smoker  . Smokeless tobacco: Never Used  Substance and Sexual Activity  . Alcohol use: No  . Drug use: No  . Sexual activity: Yes    Partners: Male  Lifestyle  . Physical activity:    Days per week: Not on file    Minutes per session: Not on file  . Stress: Not on file  Relationships  . Social connections:    Talks on phone: Not on file    Gets together: Not on file    Attends religious service: Not on file    Active member of club or organization: Not on file    Attends meetings of clubs or organizations: Not on file    Relationship status: Not on file  . Intimate partner violence:    Fear of current or ex partner: Not on file    Emotionally abused: Not on file    Physically abused: Not on file    Forced sexual activity: Not on file  Other Topics Concern  . Not on file  Social History Narrative   Exercise-- no   Current Outpatient Medications on File Prior to Visit  Medication Sig Dispense Refill  . albuterol (VENTOLIN HFA) 108 (90 Base) MCG/ACT inhaler Inhale 2 puffs into the lungs every 6 (six) hours as needed for wheezing or shortness of breath. For shortness of breath 3 Inhaler 0  . Ascorbic Acid (VITAMIN C) 1000 MG tablet Take 1,000 mg by mouth daily.    [name] atorvastatin (LIPITOR) 10 MG tablet TAKE ONE TABLET BY MOUTH EVERY OTHER DAY 45 tablet 0  . Blood Glucose Monitoring Suppl (ONETOUCH VERIO) w/Device KIT 1 Device by Does not apply route daily. 1 kit 0  . empagliflozin (JARDIANCE) 10 MG TABS tablet Take 10 mg by mouth daily. 90 tablet 3  . fenofibrate 160 MG tablet TAKE 1 TABLET BY MOUTH EVERY DAY 90 tablet 0  . fluticasone (FLOVENT HFA) 110 MCG/ACT inhaler Inhale 2 puffs into the lungs 2 (two) times daily. 1 Inhaler 2  . gabapentin (NEURONTIN) 100 MG capsule Take 1 capsule (100 mg total) by mouth 3 (three) times  daily. Must be seen prior to future refills. Please call [phone] for an appt. 270 capsule 0  . glucose blood (ONETOUCH VERIO) test strip Used to check blood sugars three times a day. 100 each 12  . Insulin Pen Needle 32G X 4 MM MISC Use with Ozempic once weekly 100 each 0  . levalbuterol (XOPENEX) 1.25 MG/3ML nebulizer solution USE ONE VIAL BY NEBULIZATION EVERY 6 HOURS AS NEEDED FOR WHEEZING OR SHORTNESS OF BREATH 675 mL 0  . losartan (COZAAR)  100 MG tablet TAKE 1 TABLET BY MOUTH EVERY DAY 90 tablet 0  . montelukast (SINGULAIR) 10 MG tablet Take 1 tablet (10 mg total) by mouth at bedtime. 90 tablet 0  . ONETOUCH DELICA LANCETS 51Z MISC Check 3x a day 200 each 3  . repaglinide (PRANDIN) 1 MG tablet Take 1 tablet (1 mg total) by mouth 3 (three) times daily before meals. 90 tablet 11  . Semaglutide, 1 MG/DOSE, (OZEMPIC, 1 MG/DOSE,) 2 MG/1.5ML SOPN Inject 1 mg into the skin once a week. 12 pen 3   No current facility-administered medications on file prior to visit.    No Known Allergies Family History  Problem Relation Age of Onset  . Diabetes Mother   . Hyperlipidemia Mother   . Hypertension Mother   . Diabetes Father   . Hyperlipidemia Father   . Hypertension Father     PE: BP 110/68   Pulse 90   Ht _0  (1.6 m)   Wt 150 lb (68 kg)   SpO2 98%   BMI 26.57 kg/m  Wt Readings from Last 3 Encounters:  06/13/18 150 lb (68 kg)  02/22/18 151 lb (68.5 kg)  12/29/17 154 lb (69.9 kg)   Constitutional: normal weight, in NAD Eyes: PERRLA, EOMI, no exophthalmos ENT: moist mucous membranes, no thyromegaly, no cervical lymphadenopathy Cardiovascular: RRR, No MRG Respiratory: CTA B Gastrointestinal: abdomen soft, NT, ND, BS+ Musculoskeletal: no deformities, strength intact in all 4 Skin: moist, warm, no rashes Neurological: no tremor with outstretched hands, DTR normal in all 4  ASSESSMENT: 1. DM2, non-insulin-dependent, uncontrolled, without long-term complications, but with  hyperglycemia  2. HL  3.  Yeast vaginitis  PLAN:  1. Patient with longstanding, previously uncontrolled diabetes, on oral antidiabetic regimen and weekly GLP-1 receptor agonist.  At last visit HbA1c was much better, at 6.5%.  At that time, she was on 2 meglitinides so we stopped Starlix and continued Prandin.  We continued Ozempic and Jardiance.  She is tolerating these well but continues to have increased urination and trying to drink plenty of water.  A BMP did not show decreased kidney function or an increased potassium at last visit. -At last visit I also suggested a referral to nutrition but she wanted to think about this.  We also discussed about the benefits and side effects of metformin and I explained that this is a very good drug which I recommend.  We held off adding it at last visit since her HbA1c was so good. -At this visit, sugars are better although she does forget Prandin occasionally.  Unfortunately, she only checks in the morning and I advised her to check sugars later in the day, also.  However, I think we can stop Prandin at this point. - I suggested to:  Patient Instructions  Please continue: - Jardiance 10 mg before b'fast - Ozempic 1 mg weekly  Please stop Prandin.  Take Diflucan 1 tab, and repeat in 5 days if you still have itching.  Please come back for a follow-up appointment in 4 months.  - today, HbA1c is 5.9% (better) - continue checking sugars at different times of the day - check 1x a day, rotating checks - advised for yearly eye exams >> she is UTD - Return to clinic in 3 mo with sugar log   2. HL - Reviewed latest lipid panel from 06/2017: All fractions at goal Lab Results  Component Value Date   CHOL 112 06/20/2017   HDL 42 06/20/2017  LDLCALC 53 06/20/2017   TRIG 85 06/20/2017   CHOLHDL 3.6 03/30/2015  - Continues Lipitor and fenofibrate without side effects.   2.  Yeast vaginitis -Persistent itching despite use of over-the-counter cream -I  called in a prescription for Diflucan and advised her to let me know if she persists having symptoms after taking this  LABS NEED TO GO TO [facility] -patient's husband works there.  Philemon Kingdom, MD PhD [facility] Endocrinology

## 2018-06-13 NOTE — Addendum Note (Signed)
Addended by: Darliss Ridgel I on: 06/13/2018 11:32 AM   Modules accepted: Orders

## 2018-06-14 ENCOUNTER — Encounter: Payer: Self-pay | Admitting: Internal Medicine

## 2018-06-25 ENCOUNTER — Other Ambulatory Visit: Payer: Self-pay | Admitting: Medical

## 2018-06-25 ENCOUNTER — Ambulatory Visit: Payer: 59 | Admitting: Medical

## 2018-06-25 ENCOUNTER — Encounter: Payer: Self-pay | Admitting: Medical

## 2018-06-25 VITALS — BP 122/65 | HR 100 | Temp 98.2°F | Resp 16 | Ht 63.0 in | Wt 149.8 lb

## 2018-06-25 DIAGNOSIS — J302 Other seasonal allergic rhinitis: Secondary | ICD-10-CM

## 2018-06-25 DIAGNOSIS — J40 Bronchitis, not specified as acute or chronic: Secondary | ICD-10-CM | POA: Diagnosis not present

## 2018-06-25 DIAGNOSIS — I10 Essential (primary) hypertension: Secondary | ICD-10-CM | POA: Diagnosis not present

## 2018-06-25 DIAGNOSIS — J45909 Unspecified asthma, uncomplicated: Secondary | ICD-10-CM

## 2018-06-25 DIAGNOSIS — E785 Hyperlipidemia, unspecified: Secondary | ICD-10-CM | POA: Diagnosis not present

## 2018-06-25 MED ORDER — BENZONATATE 100 MG PO CAPS: 100.0000 mg | ORAL_CAPSULE | Freq: Three times a day (TID) | ORAL | 0 refills | Status: DC | PRN

## 2018-06-25 MED ORDER — SEMAGLUTIDE (1 MG/DOSE) 2 MG/1.5ML ~~LOC~~ SOPN: 1.0000 mg | PEN_INJECTOR | SUBCUTANEOUS | 3 refills | Status: DC

## 2018-06-25 MED ORDER — FENOFIBRATE 160 MG PO TABS: 160.0000 mg | ORAL_TABLET | Freq: Every day | ORAL | 0 refills | Status: DC

## 2018-06-25 MED ORDER — MONTELUKAST SODIUM 10 MG PO TABS: 10.0000 mg | ORAL_TABLET | Freq: Every day | ORAL | 0 refills | Status: DC

## 2018-06-25 MED ORDER — AZITHROMYCIN 250 MG PO TABS: ORAL_TABLET | ORAL | 0 refills | Status: DC

## 2018-06-25 MED ORDER — FLUCONAZOLE 150 MG PO TABS: 150.0000 mg | ORAL_TABLET | Freq: Once | ORAL | 0 refills | Status: AC

## 2018-06-25 MED ORDER — FLUTICASONE PROPIONATE 50 MCG/ACT NA SUSP: 2.0000 | Freq: Every day | NASAL | 1 refills | Status: DC

## 2018-06-25 MED ORDER — LEVALBUTEROL HCL 1.25 MG/3ML IN NEBU: INHALATION_SOLUTION | RESPIRATORY_TRACT | 0 refills | Status: DC

## 2018-06-25 MED ORDER — ALBUTEROL SULFATE HFA 108 (90 BASE) MCG/ACT IN AERS: 2.0000 | INHALATION_SPRAY | Freq: Four times a day (QID) | RESPIRATORY_TRACT | 0 refills | Status: DC | PRN

## 2018-06-25 MED ORDER — LOSARTAN POTASSIUM 100 MG PO TABS: 100.0000 mg | ORAL_TABLET | Freq: Every day | ORAL | 0 refills | Status: DC

## 2018-06-25 MED ORDER — FLUTICASONE PROPIONATE HFA 110 MCG/ACT IN AERO: 2.0000 | INHALATION_SPRAY | Freq: Two times a day (BID) | RESPIRATORY_TRACT | 2 refills | Status: DC

## 2018-06-25 NOTE — Progress Notes (Signed)
Subjective:    Patient ID: Dominique [name], female    DOB: 1960-06-22, 58 y.o.   MRN: [id number]  HPI  Pt in for  3 days of scratchy throat initially followed by nasal congestion, ear pressure and now some chest congestion.   Pt states cough is mostly dry but occasionally productive.  Pt mom just recently fell. Pt needs to visit with her/take care of her.  Pt last a1c was 5.9 about 2 weeks ago.Pt is seeing endocrinologist.  Pt not sure when she will visit with her mom.  Pt feels like last 24 hours she is getting tighter in the chest but minimal. On clarification is denying any chest pain.  Pt has history of asthma in the past. She has not been using flovent daily as she should.   She feels like may start to have asthma flare.    Review of Systems  Constitutional: Negative for chills, fatigue and fever.  HENT: Positive for congestion, ear discharge and sore throat. Negative for drooling, postnasal drip, sinus pressure, sneezing and trouble swallowing.   Respiratory: Positive for cough and chest tightness. Negative for apnea, choking and wheezing.        Feels mild tight as if about to have possible asthma flare per pt.  Cardiovascular: Negative for chest pain and palpitations.  Gastrointestinal: Negative for abdominal pain, blood in stool, nausea and vomiting.  Musculoskeletal: Negative for back pain and neck pain.  Skin: Negative for rash.  Neurological: Negative for dizziness, seizures, syncope, weakness, light-headedness, numbness and headaches.  Hematological: Negative for adenopathy. Does not bruise/bleed easily.  Psychiatric/Behavioral: Negative for behavioral problems, confusion and suicidal ideas. The patient is not nervous/anxious.     Past Medical History:  Diagnosis Date  . Arthritis    left foot  . Asthma   . Bell palsy 2015  . Endometriosis   . GERD (gastroesophageal reflux disease)   . H/O hiatal hernia   . Histoplasmosis 2002   history of, [place]  raised  . Hypertension   . Kidney stone      Social History   Socioeconomic History  . Marital status: Married    Spouse name: Not on file  . Number of children: Not on file  . Years of education: Not on file  . Highest education level: Not on file  Occupational History    Comment: unemployed  Social Needs  . Financial resource strain: Not on file  . Food insecurity:    Worry: Not on file    Inability: Not on file  . Transportation needs:    Medical: Not on file    Non-medical: Not on file  Tobacco Use  . Smoking status: Never Smoker  . Smokeless tobacco: Never Used  Substance and Sexual Activity  . Alcohol use: No  . Drug use: No  . Sexual activity: Yes    Partners: Male  Lifestyle  . Physical activity:    Days per week: Not on file    Minutes per session: Not on file  . Stress: Not on file  Relationships  . Social connections:    Talks on phone: Not on file    Gets together: Not on file    Attends religious service: Not on file    Active member of club or organization: Not on file    Attends meetings of clubs or organizations: Not on file    Relationship status: Not on file  . Intimate partner violence:    Fear of current  or ex partner: Not on file    Emotionally abused: Not on file    Physically abused: Not on file    Forced sexual activity: Not on file  Other Topics Concern  . Not on file  Social History Narrative   Exercise-- no    Past Surgical History:  Procedure Laterality Date  . laparatomy  1985  . RADIAL HEAD ARTHROPLASTY Left 02/23/2013   Procedure: LEFT RADIAL HEAD ARTHROPLASTY qwith ligament reconstruction;  Surgeon: Roseanne Kaufman, MD;  Location: [facility];  Service: Orthopedics;  Laterality: Left;  . TONSILLECTOMY AND ADENOIDECTOMY  1967  . uterine lining ablation  2002    Family History  Problem Relation Age of Onset  . Diabetes Mother   . Hyperlipidemia Mother   . Hypertension Mother   . Diabetes Father   . Hyperlipidemia Father   .  Hypertension Father     No Known Allergies  Current Outpatient Medications on File Prior to Visit  Medication Sig Dispense Refill  . Ascorbic Acid (VITAMIN C) 1000 MG tablet Take 1,000 mg by mouth daily.    [name] atorvastatin (LIPITOR) 10 MG tablet TAKE ONE TABLET BY MOUTH EVERY OTHER DAY 45 tablet 0  . Blood Glucose Monitoring Suppl (ONETOUCH VERIO) w/Device KIT 1 Device by Does not apply route daily. 1 kit 0  . empagliflozin (JARDIANCE) 10 MG TABS tablet Take 10 mg by mouth daily. 90 tablet 3  . gabapentin (NEURONTIN) 100 MG capsule Take 1 capsule (100 mg total) by mouth 3 (three) times daily. Must be seen prior to future refills. Please call [phone] for an appt. 270 capsule 0  . glucose blood (ONETOUCH VERIO) test strip Used to check blood sugars three times a day. 100 each 12  . Insulin Pen Needle 32G X 4 MM MISC Use with Ozempic once weekly 100 each 0  . levalbuterol (XOPENEX) 1.25 MG/3ML nebulizer solution USE ONE VIAL BY NEBULIZATION EVERY 6 HOURS AS NEEDED FOR WHEEZING OR SHORTNESS OF BREATH 675 mL 0  . ONETOUCH DELICA LANCETS 50Y MISC Check 3x a day 200 each 3   No current facility-administered medications on file prior to visit.     BP 122/65   Pulse 100   Temp 98.2 F (36.8 C) (Oral)   Resp 16   Ht _0  (1.6 m)   Wt 149 lb 12.8 oz (67.9 kg)   SpO2 100%   BMI 26.54 kg/m       Objective:   Physical Exam  General  Mental Status - Alert. General Appearance - Well groomed. Not in acute distress.  Skin Rashes- No Rashes.  HEENT Head- Normal. Ear Auditory Canal - Left- Normal. Right - Normal.Tympanic Membrane- Left- Normal. Right- Normal. Eye Sclera/Conjunctiva- Left- Normal. Right- Normal. Nose & Sinuses Nasal Mucosa- Left-  Boggy and Congested. Right-  Boggy and  Congested.Bilateral faint maxillary and frontal sinus pressure. Mouth & Throat Lips: Upper Lip- Normal: no dryness, cracking, pallor, cyanosis, or vesicular eruption. Lower Lip-Normal: no dryness,  cracking, pallor, cyanosis or vesicular eruption. Buccal Mucosa- Bilateral- No Aphthous ulcers. Oropharynx- No Discharge or Erythema. Tonsils: Characteristics- Bilateral- No Erythema or Congestion. Size/Enlargement- Bilateral- No enlargement. Discharge- bilateral-None.  Neck Neck- Supple. No Masses.   Chest and Lung Exam Auscultation: Breath Sounds:- even and unlabored. Mild shallow.  Cardiovascular Auscultation:Rythm- Regular, rate and rhythm. Murmurs & Other Heart Sounds:Ausculatation of the heart reveal- No Murmurs.  Lymphatic Head & Neck General Head & Neck Lymphatics: Bilateral: Description- No Localized lymphadenopathy.  Lower ext-  no pedal edema. Negative homans signs.       Assessment & Plan:  You do appear to have early bronchitis and possible sinusitis.  Potential upcoming trip to visit your mother in [place].  With your long history, recent symptoms and potential travel, I am going to prescribe you a azithromycin antibiotic.  For nasal congestion, prescribed Flonase.  For cough making benzonatate available.  For your history of asthma and concern for possible early flare, I do want you to go ahead and start her Flovent 2 inh twice daily. Use albuterol as well. If wheezing worsens then might need to give you tapered prednisone.  If have to rx prednisone then may need to give you sliding scale meal time insulin. Update me in 2 days how you are dong.  Refilled chronic meds  Follow up in 7-10 days or as needed(before potential trip)  Mackie Pai, PA-C

## 2018-06-25 NOTE — Patient Instructions (Addendum)
You do appear to have early bronchitis and possible sinusitis.  Potential upcoming trip to visit your mother in [place].  With your long history, recent symptoms and potential travel, I am going to prescribe you a azithromycin antibiotic.  For nasal congestion, prescribed Flonase.  For cough making benzonatate available.  For your history of asthma and concern for possible early flare, I do want you to go ahead and start her Flovent 2 inh twice daily. Use albuterol as well. If wheezing worsens then might need to give you tapered prednisone.  If have to rx prednisone then may need to give you sliding scale meal time insulin. Update me in 2 days how you are dong.  Refilled chronic meds  Follow up in 7-10 days or as needed(before potential trip)

## 2018-07-14 ENCOUNTER — Other Ambulatory Visit: Payer: Self-pay | Admitting: Medical

## 2018-07-24 ENCOUNTER — Encounter: Payer: Self-pay | Admitting: Internal Medicine

## 2018-07-24 ENCOUNTER — Other Ambulatory Visit: Payer: Self-pay | Admitting: Internal Medicine

## 2018-07-24 DIAGNOSIS — E119 Type 2 diabetes mellitus without complications: Secondary | ICD-10-CM | POA: Diagnosis not present

## 2018-07-24 LAB — HM DIABETES EYE EXAM

## 2018-07-24 MED ORDER — EMPAGLIFLOZIN 10 MG PO TABS: 10.0000 mg | ORAL_TABLET | Freq: Every day | ORAL | 3 refills | Status: DC

## 2018-07-25 ENCOUNTER — Encounter: Payer: Self-pay | Admitting: Neurology

## 2018-07-25 ENCOUNTER — Ambulatory Visit: Payer: 59 | Admitting: Neurology

## 2018-07-25 ENCOUNTER — Other Ambulatory Visit: Payer: Self-pay

## 2018-07-25 VITALS — BP 123/73 | HR 95 | Ht 63.0 in | Wt 147.2 lb

## 2018-07-25 DIAGNOSIS — G510 Bell's palsy: Secondary | ICD-10-CM | POA: Diagnosis not present

## 2018-07-25 MED ORDER — GABAPENTIN 100 MG PO CAPS: 100.0000 mg | ORAL_CAPSULE | Freq: Two times a day (BID) | ORAL | 3 refills | Status: DC

## 2018-07-25 NOTE — Progress Notes (Signed)
I have read the note, and I agree with the clinical assessment and plan.  Huda Petrey K Kashari Chalmers   

## 2018-07-25 NOTE — Patient Instructions (Signed)
Start taking gabapentin 100 mg twice daily

## 2018-07-25 NOTE — Progress Notes (Signed)
PATIENT: Dominique [name] DOB: Jan 17, 1961  REASON FOR VISIT: follow up HISTORY FROM: patient  HISTORY OF PRESENT ILLNESS: Today 07/25/18  Ms. Dominique [name] is a 58 year old female with history of right-sided Bell's palsy in 2014. She is currently taking gabapentin 100 mg 3 times a day. She had a CT scan of the brain in April 2019 that was unremarkable.  She denies any residual right-sided pain however she reports right sided sensation of swelling, numbness specifically whenever she gets stressed or upset.  She thinks the gabapentin is helpful however she forgets to take it and would like to decrease her dose frequency.  She is currently working with her eye doctor to help the problem of her right eye leaking and not closing all the way.  She reports she is tolerating the gabapentin well.  She denies any new problems or concerns.  She still does have some asymmetry to the right side of her mouth when she smiles and this is bothersome to her.  She presents today for follow-up unaccompanied.  HISTORY 07/24/2017 Dr. Jannifer Franklin  Dominique [name] is a 58 year old right-handed white female with a history of diabetes.  The patient had very poorly controlled diabetes around the time of onset of her Bell's palsy, she was running a hemoglobin A1c around 12-13.  The patient originally noted onset of a severe Bell's palsy on 03 April 2013.  The patient never regained full function of the right face.  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  The patient has had persistent pressure and discomfort in the right cheek area.  Intermittently, she has noted episodes of ear pain and some tingling sensations of the right face and tongue that may come and go.  She had an episode such as this on 27 February 2015, and again on 07 February 2017. Within the last 5 days, the patient has had a similar event.  The patient has been placed on prednisone and Valtrex.  Her ear pain has gone away, she is left with her  usual pressure sensation in the right face.  There is some question whether the right eye is somewhat more droopy.  The patient notes that the eye waters quite a bit.  The patient reports no numbness or weakness of the arms or legs.  She has not had any change in her balance or difficulty controlling the bowels or the bladder.  She does have migraine type headaches off and on over the last 2 years, usually occurring on average about 3 times a year.  The patient has had vertigo in the past, she is not having this currently.  The patient denies any blackouts, she denies any problems with swallowing or choking.  She denies double vision.  She is sent to this office for an evaluation.  REVIEW OF SYSTEMS: Out of a complete 14 system review of symptoms, the patient complains only of the following symptoms, and all other reviewed systems are negative.   ALLERGIES: No Known Allergies  HOME MEDICATIONS: Outpatient Medications Prior to Visit  Medication Sig Dispense Refill  . albuterol (VENTOLIN HFA) 108 (90 Base) MCG/ACT inhaler Inhale 2 puffs into the lungs every 6 (six) hours as needed for wheezing or shortness of breath. For shortness of breath 3 Inhaler 0  . Ascorbic Acid (VITAMIN C) 1000 MG tablet Take 1,000 mg by mouth daily.    [name] atorvastatin (LIPITOR) 10 MG tablet TAKE ONE TABLET BY MOUTH EVERY OTHER DAY 45 tablet 0  .  Blood Glucose Monitoring Suppl (ONETOUCH VERIO) w/Device KIT 1 Device by Does not apply route daily. 1 kit 0  . empagliflozin (JARDIANCE) 10 MG TABS tablet Take 10 mg by mouth daily. 90 tablet 3  . fenofibrate 160 MG tablet Take 1 tablet (160 mg total) by mouth daily. 90 tablet 0  . FLOVENT HFA 110 MCG/ACT inhaler INHALE 2 PUFFS INTO THE LUNGS TWICE DAILY 36 g 0  . fluticasone (FLONASE) 50 MCG/ACT nasal spray SHAKE LIQUID AND USE 2 SPRAYS IN EACH NOSTRIL DAILY 48 g 1  . glucose blood (ONETOUCH VERIO) test strip Used to check blood sugars three times a day. 100 each 12  . Insulin Pen  Needle 32G X 4 MM MISC Use with Ozempic once weekly 100 each 0  . levalbuterol (XOPENEX) 1.25 MG/3ML nebulizer solution USE ONE VIAL BY NEBULIZATION EVERY 6 HOURS AS NEEDED FOR WHEEZING OR SHORTNESS OF BREATH 675 mL 0  . losartan (COZAAR) 100 MG tablet Take 1 tablet (100 mg total) by mouth daily. 90 tablet 0  . montelukast (SINGULAIR) 10 MG tablet Take 1 tablet (10 mg total) by mouth at bedtime. 90 tablet 0  . ONETOUCH DELICA LANCETS 64P MISC Check 3x a day 200 each 3  . Semaglutide, 1 MG/DOSE, (OZEMPIC, 1 MG/DOSE,) 2 MG/1.5ML SOPN Inject 1 mg into the skin once a week. 12 pen 3  . gabapentin (NEURONTIN) 100 MG capsule Take 1 capsule (100 mg total) by mouth 3 (three) times daily. Must be seen prior to future refills. Please call [phone] for an appt. 270 capsule 0  . azithromycin (ZITHROMAX) 250 MG tablet Take 2 tablets by mouth on day 1, followed by 1 tablet by mouth daily for 4 days. 6 tablet 0  . benzonatate (TESSALON) 100 MG capsule Take 1 capsule (100 mg total) by mouth 3 (three) times daily as needed. 30 capsule 0   No facility-administered medications prior to visit.     PAST MEDICAL HISTORY: Past Medical History:  Diagnosis Date  . Arthritis    left foot  . Asthma   . Bell palsy 2015  . Endometriosis   . GERD (gastroesophageal reflux disease)   . H/O hiatal hernia   . 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 Hypertension   . Kidney stone     PAST SURGICAL HISTORY: Past Surgical History:  Procedure Laterality Date  . laparatomy  1985  . RADIAL HEAD ARTHROPLASTY Left 02/23/2013   Procedure: LEFT RADIAL HEAD ARTHROPLASTY qwith ligament reconstruction;  Surgeon: Roseanne Kaufman, MD;  Location: [facility] [place];  Service: Orthopedics;  Laterality: Left;  . TONSILLECTOMY AND ADENOIDECTOMY  1967  . uterine lining ablation  2002    FAMILY HISTORY: Family History  Problem Relation Age of Onset  . Diabetes Mother   . Hyperlipidemia Mother   . Hypertension Mother   . Diabetes  Father   . Hyperlipidemia Father   . Hypertension Father     SOCIAL HISTORY: Social History   Socioeconomic History  . Marital status: Married    Spouse name: Not on file  . Number of children: Not on file  . Years of education: Not on file  . Highest education level: Not on file  Occupational History    Comment: unemployed  Social Needs  . Financial resource strain: Not on file  . Food insecurity:    Worry: Not on file    Inability: Not on file  . Transportation needs:    Medical: Not on file  Non-medical: Not on file  Tobacco Use  . Smoking status: Never Smoker  . Smokeless tobacco: Never Used  Substance and Sexual Activity  . Alcohol use: No  . Drug use: No  . Sexual activity: Yes    Partners: Male  Lifestyle  . Physical activity:    Days per week: Not on file    Minutes per session: Not on file  . Stress: Not on file  Relationships  . Social connections:    Talks on phone: Not on file    Gets together: Not on file    Attends religious service: Not on file    Active member of club or organization: Not on file    Attends meetings of clubs or organizations: Not on file    Relationship status: Not on file  . Intimate partner violence:    Fear of current or ex partner: Not on file    Emotionally abused: Not on file    Physically abused: Not on file    Forced sexual activity: Not on file  Other Topics Concern  . Not on file  Social History Narrative   Exercise-- no      PHYSICAL EXAM  Vitals:   07/25/18 0921  BP: 123/73  Pulse: 95  Weight: 147 lb 3.2 oz (66.8 kg)  Height: _0  (1.6 m)   Body mass index is 26.08 kg/m.  Generalized: Well developed, in no acute distress   Neurological examination  Mentation: Alert oriented to time, place, history taking. Follows all commands speech and language fluent Cranial nerve II-XII: Pupils were equal round reactive to light. Extraocular movements were full, visual field were full on confrontational test.   Right ptosis, asymmetry to her right mouth l. Uvula tongue midline. Head turning and shoulder shrug were normal and symmetric. Motor: The motor testing reveals 5 over 5 strength of all 4 extremities. Good symmetric motor tone is noted throughout.  Sensory: Sensory testing is intact to soft touch on all 4 extremities. No evidence of extinction is noted.  Coordination: Cerebellar testing reveals good finger-nose-finger and heel-to-shin bilaterally.  Gait and station: Gait is normal. Tandem gait is normal.  Reflexes: Deep tendon reflexes are symmetric and normal bilaterally.   DIAGNOSTIC DATA (LABS, IMAGING, TESTING) - I reviewed patient records, labs, notes, testing and imaging myself where available.  Lab Results  Component Value Date   WBC 4.9 03/24/2014   HGB 16.5 (A) 03/24/2014   HCT 48 (A) 03/24/2014   MCV 86.4 02/21/2013   PLT 166 03/24/2014      Component Value Date/Time   NA 142 02/22/2018 1451   K 4.2 02/22/2018 1451   CL 104 02/22/2018 1451   CO2 22 02/22/2018 1451   GLUCOSE 117 (H) 02/22/2018 1451   GLUCOSE 211 (H) 04/03/2013 0750   BUN 15 02/22/2018 1451   CREATININE 0.83 02/22/2018 1451   CALCIUM 10.1 02/22/2018 1451   PROT 7.4 01/19/2010   ALBUMIN 4.6 03/30/2015 1128   AST 33 06/20/2017   AST 37 03/30/2015 1128   ALT 47 (A) 06/20/2017   ALT 52 03/30/2015 1128   ALKPHOS 81 06/20/2017   ALKPHOS 83 03/30/2015 1128   BILITOT 1.2 01/19/2010   GFRNONAA 77 02/22/2018 1451   GFRAA 89 02/22/2018 1451   Lab Results  Component Value Date   CHOL 112 06/20/2017   HDL 42 06/20/2017   LDLCALC 53 06/20/2017   TRIG 85 06/20/2017   CHOLHDL 3.6 03/30/2015   Lab Results  Component Value Date  HGBA1C 5.9 (A) 06/13/2018   No results found for: VITAMINB12 Lab Results  Component Value Date   TSH 1.56 03/24/2014      ASSESSMENT AND PLAN 58 y.o. year old female  has a past medical history of Arthritis, Asthma, Bell palsy (2015), Endometriosis, GERD (gastroesophageal  reflux disease), H/O hiatal hernia, Histoplasmosis (2002), Hypertension, and Kidney stone. here with:  1.  History of right-sided Bell's palsy  She is currently taking low-dose gabapentin 100 mg 3 times a day.  She reports she will have sensations of swelling, altered sensation to the right side of her face during times of stress or when she is upset. She is requesting to decrease the frequency of her gabapentin.  She will start taking 100 mg twice a day.  We discussed she could potentially go down to taking the medication only once a day and even stop the medication.  She will call for any dose adjustments. She is tolerating the medication well.  She will follow-up in 1 year or sooner if needed.  I advised her that if her symptoms worsen or if she develops any new symptoms she should let us know.  A refill was sent to her pharmacy.   I spent 15 minutes with the patient. 50% of this time was spent discussing her plan of care.   Butler Denmark, AGNP-C, DNP 07/25/2018, 9:48 AM [facility] [address] [place], [place] [address] [phone]

## 2018-07-31 ENCOUNTER — Telehealth: Payer: Self-pay | Admitting: *Deleted

## 2018-07-31 NOTE — Telephone Encounter (Signed)
Received Diabetic Eye Exam Report from Venda Rodes OD PA; forwarded to provider/SLS 03/17

## 2018-08-05 ENCOUNTER — Encounter: Payer: Self-pay | Admitting: Internal Medicine

## 2018-08-06 ENCOUNTER — Encounter: Payer: Self-pay | Admitting: Internal Medicine

## 2018-08-10 ENCOUNTER — Encounter: Payer: Self-pay | Admitting: Internal Medicine

## 2018-08-10 MED ORDER — GLIPIZIDE ER 5 MG PO TB24: 5.0000 mg | ORAL_TABLET | Freq: Every day | ORAL | 3 refills | Status: DC

## 2018-08-16 ENCOUNTER — Other Ambulatory Visit: Payer: Self-pay | Admitting: Family Medicine

## 2018-08-16 DIAGNOSIS — E785 Hyperlipidemia, unspecified: Secondary | ICD-10-CM

## 2018-09-18 ENCOUNTER — Other Ambulatory Visit: Payer: Self-pay | Admitting: Medical

## 2018-09-18 DIAGNOSIS — J302 Other seasonal allergic rhinitis: Secondary | ICD-10-CM

## 2018-09-18 DIAGNOSIS — E785 Hyperlipidemia, unspecified: Secondary | ICD-10-CM

## 2018-09-18 DIAGNOSIS — I10 Essential (primary) hypertension: Secondary | ICD-10-CM

## 2018-10-05 ENCOUNTER — Telehealth: Payer: Self-pay | Admitting: *Deleted

## 2018-10-05 NOTE — Telephone Encounter (Signed)
Patient wanted appt to be seen for her discolored and swollen in some spot legs.  She would like to wait to see dr Laury Axon on Tuesday.  Symptoms given to look out for dvt or pe and go to ER.  Patient scheduled for Tuesday.

## 2018-10-08 ENCOUNTER — Ambulatory Visit: Payer: 59 | Admitting: Family Medicine

## 2018-10-09 ENCOUNTER — Other Ambulatory Visit: Payer: Self-pay

## 2018-10-09 ENCOUNTER — Ambulatory Visit: Payer: 59 | Admitting: Family Medicine

## 2018-10-09 ENCOUNTER — Encounter: Payer: Self-pay | Admitting: Family Medicine

## 2018-10-09 VITALS — BP 112/50 | HR 99 | Temp 98.7°F | Ht 63.0 in | Wt 149.0 lb

## 2018-10-09 DIAGNOSIS — M79605 Pain in left leg: Secondary | ICD-10-CM

## 2018-10-09 NOTE — Progress Notes (Signed)
Patient ID: Dominique [name], female    DOB: 1960-11-18  Age: 58 y.o. MRN: [id number]    Subjective:  Subjective  HPI Dominique [name] presents for c/o heaviness and swelling in L low leg.  It improved some over the weekend.  She said had veins bulging out --- those have subsided     She has pain with walking and the longer she walks the worse It hurts -- no pain at rest   Review of Systems  Constitutional: Negative for appetite change, diaphoresis, fatigue and unexpected weight change.  Eyes: Negative for pain, redness and visual disturbance.  Respiratory: Negative for cough, chest tightness, shortness of breath and wheezing.   Cardiovascular: Negative for chest pain, palpitations and leg swelling.  Endocrine: Negative for cold intolerance, heat intolerance, polydipsia, polyphagia and polyuria.  Genitourinary: Negative for difficulty urinating, dysuria and frequency.  Musculoskeletal: Positive for gait problem and myalgias.  Neurological: Negative for dizziness, light-headedness, numbness and headaches.    History Past Medical History:  Diagnosis Date  . Arthritis    left foot  . Asthma   . Bell palsy 2015  . Endometriosis   . GERD (gastroesophageal reflux disease)   . H/O hiatal hernia   . 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 Hypertension   . Kidney stone     She has a past surgical history that includes Tonsillectomy and adenoidectomy (1967); uterine lining ablation (2002); laparatomy (1985); and Radial head arthroplasty (Left, 02/23/2013).   Her family history includes Diabetes in her father and mother; Hyperlipidemia in her father and mother; Hypertension in her father and mother.She reports that she has never smoked. She has never used smokeless tobacco. She reports that she does not drink alcohol or use drugs.  Current Outpatient Medications on File Prior to Visit  Medication Sig Dispense Refill  . albuterol (VENTOLIN HFA) 108 (90 Base) MCG/ACT  inhaler Inhale 2 puffs into the lungs every 6 (six) hours as needed for wheezing or shortness of breath. For shortness of breath 3 Inhaler 0  . atorvastatin (LIPITOR) 10 MG tablet TAKE 1 TABLET BY MOUTH EVERY OTHER DAY 45 tablet 1  . Blood Glucose Monitoring Suppl (ONETOUCH VERIO) w/Device KIT 1 Device by Does not apply route daily. 1 kit 0  . fenofibrate 160 MG tablet TAKE 1 TABLET(160 MG) BY MOUTH DAILY 90 tablet 0  . FLOVENT HFA 110 MCG/ACT inhaler INHALE 2 PUFFS INTO THE LUNGS TWICE DAILY 36 g 0  . fluticasone (FLONASE) 50 MCG/ACT nasal spray SHAKE LIQUID AND USE 2 SPRAYS IN EACH NOSTRIL DAILY 48 g 1  . gabapentin (NEURONTIN) 100 MG capsule Take 1 capsule (100 mg total) by mouth 2 (two) times daily. Must be seen prior to future refills. Please call [phone] for an appt. 180 capsule 3  . glipiZIDE (GLUCOTROL XL) 5 MG 24 hr tablet Take 1 tablet (5 mg total) by mouth daily with breakfast. 90 tablet 3  . glucose blood (ONETOUCH VERIO) test strip Used to check blood sugars three times a day. 100 each 12  . Insulin Pen Needle 32G X 4 MM MISC Use with Ozempic once weekly 100 each 0  . levalbuterol (XOPENEX) 1.25 MG/3ML nebulizer solution USE ONE VIAL BY NEBULIZATION EVERY 6 HOURS AS NEEDED FOR WHEEZING OR SHORTNESS OF BREATH 675 mL 0  . losartan (COZAAR) 100 MG tablet TAKE 1 TABLET(100 MG) BY MOUTH DAILY 90 tablet 0  . montelukast (SINGULAIR) 10 MG tablet TAKE 1 TABLET(10 MG) BY  MOUTH AT BEDTIME 90 tablet 0  . ONETOUCH DELICA LANCETS 16X MISC Check 3x a day 200 each 3  . Semaglutide, 1 MG/DOSE, (OZEMPIC, 1 MG/DOSE,) 2 MG/1.5ML SOPN Inject 1 mg into the skin once a week. 12 pen 3   No current facility-administered medications on file prior to visit.      Objective:  Objective  Physical Exam Vitals signs and nursing note reviewed.  Constitutional:      Appearance: She is well-developed.  HENT:     Head: Normocephalic and atraumatic.  Eyes:     Conjunctiva/sclera: Conjunctivae normal.  Neck:      Musculoskeletal: Normal range of motion and neck supple.     Thyroid: No thyromegaly.     Vascular: No carotid bruit or JVD.  Cardiovascular:     Rate and Rhythm: Normal rate and regular rhythm.     Heart sounds: Normal heart sounds. No murmur.  Pulmonary:     Effort: Pulmonary effort is normal. No respiratory distress.     Breath sounds: Normal breath sounds. No wheezing or rales.  Chest:     Chest wall: No tenderness.  Musculoskeletal:        General: No swelling or tenderness.     Right lower leg: No edema.     Left lower leg: No edema.     Comments: Prominent veins LLE  No varicose veins  No calf pain  Skin:    General: Skin is warm and dry.  Neurological:     Mental Status: She is alert and oriented to person, place, and time.    BP (!) 112/50 (BP Location: Left Arm, Cuff Size: Normal)   Pulse 99   Temp 98.7 F (37.1 C) (Oral)   Ht '5\' 3"'$  (1.6 m)   Wt 149 lb (67.6 kg)   SpO2 100%   BMI 26.39 kg/m  Wt Readings from Last 3 Encounters:  10/09/18 149 lb (67.6 kg)  07/25/18 147 lb 3.2 oz (66.8 kg)  06/25/18 149 lb 12.8 oz (67.9 kg)     Lab Results  Component Value Date   WBC 4.9 03/24/2014   HGB 16.5 (A) 03/24/2014   HCT 48 (A) 03/24/2014   PLT 166 03/24/2014   GLUCOSE 117 (H) 02/22/2018   CHOL 112 06/20/2017   TRIG 85 06/20/2017   HDL 42 06/20/2017   LDLCALC 53 06/20/2017   ALT 47 (A) 06/20/2017   AST 33 06/20/2017   NA 142 02/22/2018   K 4.2 02/22/2018   CL 104 02/22/2018   CREATININE 0.83 02/22/2018   BUN 15 02/22/2018   CO2 22 02/22/2018   TSH 1.56 03/24/2014   HGBA1C 5.9 (A) 06/13/2018    Ct Renal Stone Study  Result Date: 12/20/2017 CLINICAL DATA:  Acute onset of left flank pain. Increased urinary urgency. EXAM: CT ABDOMEN AND PELVIS WITHOUT CONTRAST TECHNIQUE: Multidetector CT imaging of the abdomen and pelvis was performed following the standard protocol without IV contrast. COMPARISON:  CT of the abdomen and pelvis performed 11/22/2009, and  abdominal radiograph performed 12/16/2009 FINDINGS: Lower chest: The visualized lung bases are grossly clear. The visualized portions of the mediastinum are unremarkable. Hepatobiliary: The liver is unremarkable in appearance. A stone is seen dependently within the gallbladder. The gallbladder is otherwise unremarkable. The common bile duct remains normal in caliber. Pancreas: The pancreas is within normal limits. Spleen: Scattered calcified granulomata are seen within the spleen. Adrenals/Urinary Tract: The adrenal glands are unremarkable in appearance. There is minimal left-sided hydronephrosis, with  an obstructing 4 mm stone at the left side of the base of the bladder, along the left vesicoureteral junction. Minimal left-sided perinephric stranding is seen. A few tiny nonobstructing left renal stones are noted. The right kidney is unremarkable in appearance. Stomach/Bowel: The stomach is unremarkable in appearance. The small bowel is within normal limits. The appendix is normal in caliber, without evidence of appendicitis. The colon is unremarkable in appearance. Vascular/Lymphatic: The abdominal aorta is unremarkable in appearance. The inferior vena cava is grossly unremarkable. No retroperitoneal lymphadenopathy is seen. No pelvic sidewall lymphadenopathy is identified. Reproductive: The bladder is decompressed and not well characterized. A small calcified fibroid is noted at the right side of the uterus. The ovaries are grossly symmetric. No suspicious adnexal masses are seen. Other: No additional soft tissue abnormalities are seen. Musculoskeletal: No acute osseous abnormalities are identified. The visualized musculature is unremarkable in appearance. IMPRESSION: 1. Minimal left-sided hydronephrosis, with an obstructing 4 mm stone noted at the left side of the base of the bladder, along the left vesicoureteral junction. 2. Few tiny nonobstructing left renal stones noted. 3. Cholelithiasis; gallbladder  otherwise unremarkable in appearance. 4. Small calcified uterine fibroid noted. Electronically Signed   By: Garald Balding M.D.   On: 12/20/2017 06:49     Assessment & Plan:  Plan  I have discontinued Mardene Celeste A. Vincelette "Trisha"'s vitamin C and empagliflozin. I am also having her maintain her Insulin Pen Needle, OneTouch Verio, glucose blood, OneTouch Delica Lancets 43X, Semaglutide (1 MG/DOSE), albuterol, levalbuterol, fluticasone, Flovent HFA, gabapentin, glipiZIDE, atorvastatin, losartan, fenofibrate, and montelukast.  No orders of the defined types were placed in this encounter.   Problem List Items Addressed This Visit    None    Visit Diagnoses    Pain of left lower extremity    -  Primary   Relevant Orders   VAS [place] ABI WITH/WO TBI    elevated legs when sitting Wear compression sock s Check art [place]   Follow-up: Return if symptoms worsen or fail to improve.  Ann Held, DO

## 2018-10-09 NOTE — Patient Instructions (Signed)
Intermittent Claudication Intermittent claudication is pain in one or both legs that occurs when walking or exercising and goes away when resting. Intermittent claudication is a symptom of peripheral arterial disease (PAD). This condition is commonly treated with rest, medicine, and healthy lifestyle changes. If medical management does not improve symptoms, surgery can be done to restore blood flow (revascularization) to the affected leg. What are the causes?  This condition is caused by buildup of fatty material (plaque) within the major arteries in the body (atherosclerosis). Plaque makes arteries stiff and narrow, which prevents enough blood from reaching the leg muscles. Pain occurs when you walk or exercise because your muscles need (but cannot get) more blood when you are moving and exercising. What increases the risk? The following factors may make you more likely to develop this condition:  A family history of atherosclerosis.  A personal history of stroke or heart disease.  Older age.  Being inactive (sedentary lifestyle).  Being overweight.  Smoking cigarettes.  Having another health condition such as: ? Diabetes. ? High blood pressure. ? High cholesterol. What are the signs or symptoms? Symptoms of this condition may first develop in the lower leg, and then they may spread to the thigh, hip, buttock, or the back of the lower leg (calf) over time. Symptoms may include:  Aches or pains.  Cramps.  A feeling of tightness, weakness, or heaviness.  A wound on the lower leg or foot that heals poorly or does not heal. How is this diagnosed? This condition may be diagnosed based on:  Your symptoms.  Your medical history.  Tests, such as: ? Blood tests. ? Arterial duplex ultrasound. This test uses images of blood vessels and surrounding organs to evaluate blood flow within arteries. ? Angiogram. In this procedure, dye is injected into arteries and then X-rays are  taken. ? Magnetic resonance angiogram (MRA). In this procedure, strong magnets and radio waves are used instead of X-rays to create images of blood vessels and blood flow. ? CT angiogram (CTA). In this procedure, a large X-ray machine called a CT scanner takes detailed pictures of blood vessels that have been injected with dye. ? Ankle-brachial index (ABI) test. This procedure measures blood pressure in the leg during exercise and at rest. ? Exercise test. For this test, you will walk on a treadmill while tests are done (such as the ABI test) to evaluate how this condition affects your ability to walk or exercise. How is this treated? Treatment for this condition may involve treatment for the underlying cause, such as treatment for high blood pressure, high cholesterol, or diabetes. Treatment may include:  Lifestyle changes such as: ? Starting a supervised or home-based exercise program. ? Losing weight. ? Quitting smoking.  Medicines to help restore blood flow through your legs.  Blood vessel surgery (angioplasty) to restore blood flow around the blocked vessel. This is also known as endovascular therapy (EVT). This is only done if your intermittent claudication is caused by severe peripheral artery disease, a condition in which blood flow is severely or totally restricted by the narrowing of the arteries. Follow these instructions at home: Lifestyle   Maintain a healthy weight.  Eat a diet that is low in saturated fats and calories. Consider working with a diet and nutrition specialist (dietitian) to help you make healthy food choices.  Do not use any products that contain nicotine or tobacco, such as cigarettes and e-cigarettes. If you need help quitting, ask your health care provider.  If   your health care provider recommended an exercise program for you, follow it as directed. Your exercise program may involve: ? Walking 3 or more times a week. ? Walking until you have certain  symptoms of intermittent claudication. ? Resting until symptoms go away. ? Gradually increasing your walking time to about 50 minutes a day. General instructions  Work with your health care provider to manage any other health conditions you may have, including diabetes, high blood pressure, or high cholesterol.  Take over-the-counter and prescription medicines only as told by your health care provider.  Keep all follow-up visits as told by your health care provider. This is important. Contact a health care provider if:  Your pain does not go away with rest.  You have sores on your legs that do not heal or have a bad smell or pus coming from them.  Your condition gets worse or does not get better with treatment. Get help right away if:  You have chest pain.  You have difficulty breathing.  You develop arm weakness.  You have trouble speaking.  Your face begins to droop.  Your foot or leg is cold or it changes color.  Your foot or leg becomes numb. These symptoms may represent a serious problem that is an emergency. Do not wait to see if the symptoms will go away. Get medical help right away. Call your local emergency services (911 in the [place] Do not drive yourself to the hospital.  Summary  Intermittent claudication is pain in one or both legs that occurs when walking or exercising and goes away when resting.  This condition is caused by buildup of fatty material (plaque) within the major arteries in the body (atherosclerosis). Plaque makes arteries stiff and narrow, which prevents enough blood from reaching the leg muscles.  Intermittent claudication can be treated with medicine and lifestyle changes. If medical treatment fails, surgery can be done to help return blood flow to the affected area.  Make sure you work with your health care provider to manage any other health conditions you may have, including diabetes, high blood pressure, or high cholesterol. This  information is not intended to replace advice given to you by your health care provider. Make sure you discuss any questions you have with your health care provider. Document Released: 03/04/2004 Document Revised: 06/02/2016 Document Reviewed: 06/02/2016 Elsevier Interactive Patient Education  2019 [employer]  

## 2018-10-12 ENCOUNTER — Other Ambulatory Visit: Payer: Self-pay

## 2018-10-12 ENCOUNTER — Ambulatory Visit: Payer: 59 | Admitting: Internal Medicine

## 2018-10-12 ENCOUNTER — Encounter: Payer: Self-pay | Admitting: Internal Medicine

## 2018-10-12 VITALS — BP 122/60 | HR 95 | Ht 63.0 in | Wt 151.0 lb

## 2018-10-12 DIAGNOSIS — E1165 Type 2 diabetes mellitus with hyperglycemia: Secondary | ICD-10-CM | POA: Diagnosis not present

## 2018-10-12 DIAGNOSIS — B373 Candidiasis of vulva and vagina: Secondary | ICD-10-CM

## 2018-10-12 DIAGNOSIS — E785 Hyperlipidemia, unspecified: Secondary | ICD-10-CM | POA: Diagnosis not present

## 2018-10-12 DIAGNOSIS — B3731 Acute candidiasis of vulva and vagina: Secondary | ICD-10-CM

## 2018-10-12 LAB — POCT GLYCOSYLATED HEMOGLOBIN (HGB A1C): Hemoglobin A1C: 6.6 % — AB (ref 4.0–5.6)

## 2018-10-12 NOTE — Progress Notes (Signed)
Patient ID: Dominique [name], female   DOB: 09-08-1960, 58 y.o.   MRN: [id number]   HPI: Dominique [name] is a 58 y.o.-year-old female, returning for follow-up for DM2, dx in 2016, but GDM in 58 non-insulin-dependent, uncontrolled, without long term complications. non-insulin-dependent, uncontrolled, without long term complications.  She previously saw Dr. Loanne Drilling.  Last visit with me 4 months ago.    Last hemoglobin A1c was: Lab Results  Component Value Date   HGBA1C 5.9 (A) 06/13/2018   HGBA1C 6.5 (A) 02/22/2018   HGBA1C 7.5 06/20/2017   HGBA1C 6.4 (A) 12/09/2014   HGBA1C 11.9 (A) 03/24/2014  HbA1c 8.9%  Pt is on a regimen of: -Glipizide XL 5 mg before breakfast-started 07/2018 after stopping Jardiance -Ozempic 0.5 >> 1 mg weekly She had to stop Jardiance due to persistent yeast infections. She stopped metformin due to lack of effect. She developed a rash with Trulicity She was previously on nateglinide and also on repaglinide.  Pt checks her sugars 1x a day: - am: 106-140, 159 >> 86-140, 149 >> 118-145, 155, 170 - 2h after b'fast: 78, 152 >> n/c >> 135 - before lunch: 97 >> 105 >> n/c - 2h after lunch: 102-132 >> n/c >> 99, 161 - before dinner: 147 >> n/c - 2h after dinner: 132-175, 199 >> n/c - bedtime: n/c - nighttime: n/c Lowest sugar was 78 >> 86 >> 99; she has hypoglycemia awareness at 90.  Highest sugar was 199 >> 149 >> 170.  Glucometer: Accu-Chek guide  Pt's meals are: - Breakfast: 1-2 egg, 1-2 toast; egg + bacon - Lunch: eating out, salad, meat + veggies - Dinner: eating out, salad, meat + veggies - Dessert: apple pie, cake - Snacks: chips  -no ckd, last BUN/creatinine:  Lab Results  Component Value Date   BUN 15 02/22/2018   BUN 9 06/20/2017   CREATININE 0.83 02/22/2018   CREATININE 0.8 06/20/2017  On losartan 100.  -+HL; last set of lipids: Lab Results  Component Value Date   CHOL 112 06/20/2017   HDL 42 06/20/2017   LDLCALC 53 06/20/2017   TRIG 85 06/20/2017   CHOLHDL 3.6 03/30/2015  On fenofibrate 160  and Lipitor 10.  - last eye exam was in 2019: No DR  - no numbness and tingling in her feet.  On Gabapentin for Bell's palsy. He had a kidney stone last summer.  Pt has FH of DM in mother and father.  ROS: Constitutional: no weight gain/no weight loss, no fatigue, no subjective hyperthermia, no subjective hypothermia Eyes: no blurry vision, no xerophthalmia ENT: no sore throat, no nodules palpated in neck, no dysphagia, no odynophagia, no hoarseness Cardiovascular: no CP/no SOB/no palpitations/no leg swelling Respiratory: no cough/no SOB/no wheezing Gastrointestinal: no N/no V/no D/no C/no acid reflux Musculoskeletal: no muscle aches/no joint aches Skin: no rashes, no hair loss Neurological: no tremors/no numbness/no tingling/no dizziness  I reviewed pt's medications, allergies, PMH, social hx, family hx, and changes were documented in the history of present illness. Otherwise, unchanged from my initial visit note.  Past Medical History:  Diagnosis Date  . Arthritis    left foot  . Asthma   . Bell palsy 2015  . Endometriosis   . GERD (gastroesophageal reflux disease)   . H/O hiatal hernia   . 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 Hypertension   . Kidney stone    Past Surgical History:  Procedure Laterality Date  . laparatomy  1985  . RADIAL HEAD ARTHROPLASTY Left 02/23/2013  Procedure: LEFT RADIAL HEAD ARTHROPLASTY qwith ligament reconstruction;  Surgeon: Roseanne Kaufman, MD;  Location: [facility];  Service: Orthopedics;  Laterality: Left;  . TONSILLECTOMY AND ADENOIDECTOMY  1967  . uterine lining ablation  2002   Social History   Socioeconomic History  . Marital status: Married    Spouse name: Not on file  . Number of children: Not on file  . Years of education: Not on file  . Highest education level: Not on file  Occupational History    Comment: unemployed  Social Needs  . Financial resource strain: Not on file  . Food insecurity:    Worry: Not on  file    Inability: Not on file  . Transportation needs:    Medical: Not on file    Non-medical: Not on file  Tobacco Use  . Smoking status: Never Smoker  . Smokeless tobacco: Never Used  Substance and Sexual Activity  . Alcohol use: No  . Drug use: No  . Sexual activity: Yes    Partners: Male  Lifestyle  . Physical activity:    Days per week: Not on file    Minutes per session: Not on file  . Stress: Not on file  Relationships  . Social connections:    Talks on phone: Not on file    Gets together: Not on file    Attends religious service: Not on file    Active member of club or organization: Not on file    Attends meetings of clubs or organizations: Not on file    Relationship status: Not on file  . Intimate partner violence:    Fear of current or ex partner: Not on file    Emotionally abused: Not on file    Physically abused: Not on file    Forced sexual activity: Not on file  Other Topics Concern  . Not on file  Social History Narrative   Exercise-- no   Current Outpatient Medications on File Prior to Visit  Medication Sig Dispense Refill  . albuterol (VENTOLIN HFA) 108 (90 Base) MCG/ACT inhaler Inhale 2 puffs into the lungs every 6 (six) hours as needed for wheezing or shortness of breath. For shortness of breath 3 Inhaler 0  . atorvastatin (LIPITOR) 10 MG tablet TAKE 1 TABLET BY MOUTH EVERY OTHER DAY 45 tablet 1  . Blood Glucose Monitoring Suppl (ONETOUCH VERIO) w/Device KIT 1 Device by Does not apply route daily. 1 kit 0  . fenofibrate 160 MG tablet TAKE 1 TABLET(160 MG) BY MOUTH DAILY 90 tablet 0  . FLOVENT HFA 110 MCG/ACT inhaler INHALE 2 PUFFS INTO THE LUNGS TWICE DAILY 36 g 0  . fluticasone (FLONASE) 50 MCG/ACT nasal spray SHAKE LIQUID AND USE 2 SPRAYS IN EACH NOSTRIL DAILY 48 g 1  . gabapentin (NEURONTIN) 100 MG capsule Take 1 capsule (100 mg total) by mouth 2 (two) times daily. Must be seen prior to future refills. Please call [phone] for an appt. 180 capsule 3   . glipiZIDE (GLUCOTROL XL) 5 MG 24 hr tablet Take 1 tablet (5 mg total) by mouth daily with breakfast. 90 tablet 3  . glucose blood (ONETOUCH VERIO) test strip Used to check blood sugars three times a day. 100 each 12  . Insulin Pen Needle 32G X 4 MM MISC Use with Ozempic once weekly 100 each 0  . levalbuterol (XOPENEX) 1.25 MG/3ML nebulizer solution USE ONE VIAL BY NEBULIZATION EVERY 6 HOURS AS NEEDED FOR WHEEZING OR SHORTNESS OF BREATH 675 mL 0  .  losartan (COZAAR) 100 MG tablet TAKE 1 TABLET(100 MG) BY MOUTH DAILY 90 tablet 0  . montelukast (SINGULAIR) 10 MG tablet TAKE 1 TABLET(10 MG) BY MOUTH AT BEDTIME 90 tablet 0  . ONETOUCH DELICA LANCETS 60A MISC Check 3x a day 200 each 3  . Semaglutide, 1 MG/DOSE, (OZEMPIC, 1 MG/DOSE,) 2 MG/1.5ML SOPN Inject 1 mg into the skin once a week. 12 pen 3   No current facility-administered medications on file prior to visit.    No Known Allergies Family History  Problem Relation Age of Onset  . Diabetes Mother   . Hyperlipidemia Mother   . Hypertension Mother   . Diabetes Father   . Hyperlipidemia Father   . Hypertension Father     PE: There were no vitals taken for this visit. Wt Readings from Last 3 Encounters:  10/09/18 149 lb (67.6 kg)  07/25/18 147 lb 3.2 oz (66.8 kg)  06/25/18 149 lb 12.8 oz (67.9 kg)   Constitutional: Normal weight, in NAD Eyes: PERRLA, EOMI, no exophthalmos ENT: moist mucous membranes, no thyromegaly, no cervical lymphadenopathy Cardiovascular: RRR, No MRG Respiratory: CTA B Gastrointestinal: abdomen soft, NT, ND, BS+ Musculoskeletal: no deformities, strength intact in all 4 Skin: moist, warm, no rashes Neurological: no tremor with outstretched hands, DTR normal in all 4  ASSESSMENT: 1. DM2, non-insulin-dependent, uncontrolled, without long-term complications, but with hyperglycemia  2. HL  3.  Yeast vaginitis  PLAN:  1. Patient with longstanding, previously uncontrolled diabetes, on oral antidiabetic  regimen and weekly GLP-1 receptor agonist.  Her sugars improved significantly on GLP-1 receptor agonist and SGLT 2 inhibitor, however, we had to stop Jardiance due to yeast infections.  Since then, sugars increased so we switched to glipizide XL.  She continues on this.  Her sugars are slightly higher, but she is planning to adjust her diet to lose weight before her physical exam for insurance in 11/2018.  Therefore, for now, I suggested to continue the current regimen.  At next visit, if sugars are still above goal, we may need to intensify her regimen. - I suggested to:  Patient Instructions  Please continue: - Glipizide XL 5 mg before b'fast - Ozempic 1 mg weekly  Please come back for a follow-up appointment in 4 months.  - today, HbA1c is 6.6% (higher) - continue checking sugars at different times of the day - check 1x a day, rotating checks - advised for yearly eye exams >> she is UTD - Return to clinic in 4 mo with sugar log    2. HL - Reviewed latest lipid panel from 2019: All fractions at goal Lab Results  Component Value Date   CHOL 112 06/20/2017   HDL 42 06/20/2017   LDLCALC 53 06/20/2017   TRIG 85 06/20/2017   CHOLHDL 3.6 03/30/2015  - Continues Lipitor and fenofibrate without side effects. -We will check another lipid panel at next visit  2.  Yeast vaginitis -Resolved after stopping Jardiance  LABS NEED TO GO TO [facility] -patient's husband works there.  Philemon Kingdom, MD PhD [facility]

## 2018-10-12 NOTE — Patient Instructions (Signed)
Please continue: - Glipizide XL 5 mg before b'fast - Ozempic 1 mg weekly  Please come back for a follow-up appointment in 4 months.

## 2018-10-16 ENCOUNTER — Other Ambulatory Visit: Payer: Self-pay

## 2018-10-16 ENCOUNTER — Ambulatory Visit (HOSPITAL_BASED_OUTPATIENT_CLINIC_OR_DEPARTMENT_OTHER)
Admission: RE | Admit: 2018-10-16 | Discharge: 2018-10-16 | Disposition: A | Discharge status: Home / Self Care | Payer: 59 | Source: Ambulatory Visit | Attending: Family Medicine | Admitting: Family Medicine

## 2018-10-16 DIAGNOSIS — M79605 Pain in left leg: Secondary | ICD-10-CM | POA: Diagnosis not present

## 2018-10-16 NOTE — Progress Notes (Signed)
ABI Doppler   Summary: Right: Resting right ankle-brachial index is within normal range. No evidence of significant right lower extremity arterial disease. The right toe-brachial index is normal.   Left: Resting left ankle-brachial index is within normal range. No evidence of significant left lower extremity arterial disease. The left toe-brachial index is normal.     10/16/18 Sinda Du

## 2018-10-17 ENCOUNTER — Encounter: Payer: Self-pay | Admitting: Family Medicine

## 2018-10-22 NOTE — Telephone Encounter (Signed)
I asked patient what question did she have, but she has not responded, but I did want you to see her message.

## 2018-10-22 NOTE — Telephone Encounter (Signed)
We can still refer to vascular if she still has heaviness feeling

## 2018-12-07 ENCOUNTER — Encounter: Payer: Self-pay | Admitting: Family Medicine

## 2018-12-25 ENCOUNTER — Other Ambulatory Visit: Payer: Self-pay | Admitting: *Deleted

## 2018-12-25 DIAGNOSIS — E785 Hyperlipidemia, unspecified: Secondary | ICD-10-CM

## 2018-12-25 DIAGNOSIS — I10 Essential (primary) hypertension: Secondary | ICD-10-CM

## 2018-12-25 MED ORDER — FENOFIBRATE 160 MG PO TABS: ORAL_TABLET | ORAL | 0 refills | Status: DC

## 2018-12-25 MED ORDER — LOSARTAN POTASSIUM 100 MG PO TABS: ORAL_TABLET | ORAL | 0 refills | Status: DC

## 2018-12-26 ENCOUNTER — Other Ambulatory Visit: Payer: Self-pay | Admitting: *Deleted

## 2018-12-26 DIAGNOSIS — J302 Other seasonal allergic rhinitis: Secondary | ICD-10-CM

## 2018-12-26 MED ORDER — MONTELUKAST SODIUM 10 MG PO TABS: ORAL_TABLET | ORAL | 0 refills | Status: DC

## 2019-02-08 ENCOUNTER — Other Ambulatory Visit: Payer: Self-pay | Admitting: *Deleted

## 2019-02-08 ENCOUNTER — Other Ambulatory Visit: Payer: Self-pay

## 2019-02-08 DIAGNOSIS — E785 Hyperlipidemia, unspecified: Secondary | ICD-10-CM

## 2019-02-08 MED ORDER — ATORVASTATIN CALCIUM 10 MG PO TABS: 10.0000 mg | ORAL_TABLET | ORAL | 0 refills | Status: DC

## 2019-02-12 ENCOUNTER — Other Ambulatory Visit: Payer: Self-pay

## 2019-02-12 ENCOUNTER — Ambulatory Visit: Payer: 59 | Admitting: Internal Medicine

## 2019-02-12 ENCOUNTER — Encounter: Payer: Self-pay | Admitting: Internal Medicine

## 2019-02-12 VITALS — BP 136/70 | HR 102 | Ht 63.5 in | Wt 155.0 lb

## 2019-02-12 DIAGNOSIS — E1165 Type 2 diabetes mellitus with hyperglycemia: Secondary | ICD-10-CM

## 2019-02-12 DIAGNOSIS — E785 Hyperlipidemia, unspecified: Secondary | ICD-10-CM | POA: Diagnosis not present

## 2019-02-12 DIAGNOSIS — B373 Candidiasis of vulva and vagina: Secondary | ICD-10-CM

## 2019-02-12 DIAGNOSIS — B3731 Acute candidiasis of vulva and vagina: Secondary | ICD-10-CM

## 2019-02-12 LAB — POCT GLYCOSYLATED HEMOGLOBIN (HGB A1C): Hemoglobin A1C: 7.1 % — AB (ref 4.0–5.6)

## 2019-02-12 MED ORDER — METFORMIN HCL ER 500 MG PO TB24: ORAL_TABLET | ORAL | 3 refills | Status: DC

## 2019-02-12 MED ORDER — METFORMIN HCL ER 500 MG PO TB24: 500.0000 mg | ORAL_TABLET | Freq: Every day | ORAL | 3 refills | Status: DC

## 2019-02-12 NOTE — Addendum Note (Signed)
Addended by: Cardell Peach I on: 02/12/2019 10:31 AM   Modules accepted: Orders

## 2019-02-12 NOTE — Patient Instructions (Addendum)
Please continue: - Glipizide XL 5 mg before b'fast - Ozempic 1 mg weekly  Please add: - Metformin ER 500 mg with dinner, and increase to 1000 mg with dinner in 1 week  Please come back for a follow-up appointment in 4 months.

## 2019-02-12 NOTE — Progress Notes (Signed)
Patient ID: Dominique [name], female   DOB: April 21, 1961, 58 y.o.   MRN: [id number]   HPI: Dominique [name] is a 58 y.o.-year-old female, returning for follow-up for DM2, dx in 2016, but GDM in 2000 non-insulin-dependent, uncontrolled, without long term complications.  She previously saw Dr. Loanne Drilling.  Last visit with me 4 months ago.    Reviewed HbA1c levels: Lab Results  Component Value Date   HGBA1C 6.6 (A) 10/12/2018   HGBA1C 5.9 (A) 06/13/2018   HGBA1C 6.5 (A) 02/22/2018   HGBA1C 7.5 06/20/2017   HGBA1C 6.4 (A) 12/09/2014   HGBA1C 11.9 (A) 03/24/2014  HbA1c 8.9%  She is on: -Glipizide XL 5 mg before breakfast- started 07/2018 -Ozempic 0.5 >> 1 mg weekly We have to stop Jardiance 07/2018 due to persistent yeast infections. She stopped metformin due to lack of effect. She developed a rash with Trulicity She was previously on nateglinide and also on repaglinide.  Pt checks her sugars once a day: - am: 86-140, 149 >> 118-145, 155, 170 >> 160-177, 181 - 2h after b'fast: 78, 152 >> n/c >> 135 >> n/c - before lunch: 97 >> 105 >> n/c - 2h after lunch: 102-132 >> n/c >> 99, 161 >> n/c - before dinner: 147 >> n/c - 2h after dinner: 132-175, 199 >> n/c >> 211 - bedtime: n/c - nighttime: n/c Lowest sugar was 78 >> 86 >> 99 >> 160; she has hypoglycemia awareness in the 90s.  Highest sugar was 199 >> 149 >> 170 >> 211.  Glucometer: Accu-Chek guide  Pt's meals are: - Breakfast: 1-2 egg, 1-2 toast; egg + bacon - Lunch: eating out, salad, meat + veggies - Dinner: eating out, salad, meat + veggies - Dessert: apple pie, cake - Snacks: chips  -No CKD, last BUN/creatinine:  Lab Results  Component Value Date   BUN 15 02/22/2018   BUN 9 06/20/2017   CREATININE 0.83 02/22/2018   CREATININE 0.8 06/20/2017  On losartan 100.  -+ HL; last set of lipids: Lab Results  Component Value Date   CHOL 112 06/20/2017   HDL 42 06/20/2017   LDLCALC 53 06/20/2017   TRIG 85 06/20/2017   CHOLHDL 3.6 03/30/2015  On fenofibrate 160 on Lipitor 10.  - last eye exam was in 07/2018: No DR  -She denies numbness and tingling in her feet  On Gabapentin for Bell's palsy. He had a kidney stone in 2019  Pt has FH of DM in mother and father.  ROS: Constitutional: + weight gain/no weight loss, no fatigue, no subjective hyperthermia, no subjective hypothermia Eyes: no blurry vision, no xerophthalmia ENT: no sore throat, no nodules palpated in neck, no dysphagia, no odynophagia, no hoarseness Cardiovascular: no CP/no SOB/no palpitations/no leg swelling Respiratory: no cough/no SOB/no wheezing Gastrointestinal: no N/no V/no D/no C/no acid reflux Musculoskeletal: no muscle aches/no joint aches Skin: no rashes, no hair loss Neurological: no tremors/no numbness/no tingling/no dizziness  I reviewed pt's medications, allergies, PMH, social hx, family hx, and changes were documented in the history of present illness. Otherwise, unchanged from my initial visit note.  Past Medical History:  Diagnosis Date  . Arthritis    left foot  . Asthma   . Bell palsy 2015  . Endometriosis   . GERD (gastroesophageal reflux disease)   . H/O hiatal hernia   . 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 Hypertension   . Kidney stone    Past Surgical History:  Procedure Laterality Date  .  laparatomy  1985  . RADIAL HEAD ARTHROPLASTY Left 02/23/2013   Procedure: LEFT RADIAL HEAD ARTHROPLASTY qwith ligament reconstruction;  Surgeon: Roseanne Kaufman, MD;  Location: [facility];  Service: Orthopedics;  Laterality: Left;  . TONSILLECTOMY AND ADENOIDECTOMY  1967  . uterine lining ablation  2002   Social History   Socioeconomic History  . Marital status: Married    Spouse name: Not on file  . Number of children: Not on file  . Years of education: Not on file  . Highest education level: Not on file  Occupational History    Comment: unemployed  Social Needs  . Financial resource strain: Not  on file  . Food insecurity    Worry: Not on file    Inability: Not on file  . Transportation needs    Medical: Not on file    Non-medical: Not on file  Tobacco Use  . Smoking status: Never Smoker  . Smokeless tobacco: Never Used  Substance and Sexual Activity  . Alcohol use: No  . Drug use: No  . Sexual activity: Yes    Partners: Male  Lifestyle  . Physical activity    Days per week: Not on file    Minutes per session: Not on file  . Stress: Not on file  Relationships  . Social Herbalist on phone: Not on file    Gets together: Not on file    Attends religious service: Not on file    Active member of club or organization: Not on file    Attends meetings of clubs or organizations: Not on file    Relationship status: Not on file  . Intimate partner violence    Fear of current or ex partner: Not on file    Emotionally abused: Not on file    Physically abused: Not on file    Forced sexual activity: Not on file  Other Topics Concern  . Not on file  Social History Narrative   Exercise-- no   Current Outpatient Medications on File Prior to Visit  Medication Sig Dispense Refill  . albuterol (VENTOLIN HFA) 108 (90 Base) MCG/ACT inhaler Inhale 2 puffs into the lungs every 6 (six) hours as needed for wheezing or shortness of breath. For shortness of breath 3 Inhaler 0  . atorvastatin (LIPITOR) 10 MG tablet Take 1 tablet (10 mg total) by mouth every other day. 45 tablet 0  . Blood Glucose Monitoring Suppl (ONETOUCH VERIO) w/Device KIT 1 Device by Does not apply route daily. 1 kit 0  . fenofibrate 160 MG tablet TAKE 1 TABLET(160 MG) BY MOUTH DAILY. 90 tablet 0  . FLOVENT HFA 110 MCG/ACT inhaler INHALE 2 PUFFS INTO THE LUNGS TWICE DAILY 36 g 0  . fluticasone (FLONASE) 50 MCG/ACT nasal spray SHAKE LIQUID AND USE 2 SPRAYS IN EACH NOSTRIL DAILY 48 g 1  . gabapentin (NEURONTIN) 100 MG capsule Take 1 capsule (100 mg total) by mouth 2 (two) times daily. Must be seen prior to  future refills. Please call [phone] for an appt. 180 capsule 3  . glipiZIDE (GLUCOTROL XL) 5 MG 24 hr tablet Take 1 tablet (5 mg total) by mouth daily with breakfast. 90 tablet 3  . glucose blood (ONETOUCH VERIO) test strip Used to check blood sugars three times a day. 100 each 12  . Insulin Pen Needle 32G X 4 MM MISC Use with Ozempic once weekly 100 each 0  . levalbuterol (XOPENEX) 1.25 MG/3ML nebulizer solution USE ONE VIAL BY  NEBULIZATION EVERY 6 HOURS AS NEEDED FOR WHEEZING OR SHORTNESS OF BREATH 675 mL 0  . losartan (COZAAR) 100 MG tablet TAKE 1 TABLET(100 MG) BY MOUTH DAILY 90 tablet 0  . montelukast (SINGULAIR) 10 MG tablet TAKE 1 TABLET(10 MG) BY MOUTH AT BEDTIME 90 tablet 0  . ONETOUCH DELICA LANCETS 20B MISC Check 3x a day 200 each 3  . Semaglutide, 1 MG/DOSE, (OZEMPIC, 1 MG/DOSE,) 2 MG/1.5ML SOPN Inject 1 mg into the skin once a week. 12 pen 3   No current facility-administered medications on file prior to visit.    No Known Allergies Family History  Problem Relation Age of Onset  . Diabetes Mother   . Hyperlipidemia Mother   . Hypertension Mother   . Diabetes Father   . Hyperlipidemia Father   . Hypertension Father     PE: BP 136/70   Pulse (!) 102   Ht 5' 3.5" (1.613 m) Comment: measured today without shoes  Wt 155 lb (70.3 kg)   SpO2 96%   BMI 27.03 kg/m  Wt Readings from Last 3 Encounters:  02/12/19 155 lb (70.3 kg)  10/12/18 151 lb (68.5 kg)  10/09/18 149 lb (67.6 kg)   Constitutional: Slightly overweight, in NAD Eyes: PERRLA, EOMI, no exophthalmos ENT: moist mucous membranes, no thyromegaly, no cervical lymphadenopathy Cardiovascular: tachycardia, RR, No MRG Respiratory: CTA B Gastrointestinal: abdomen soft, NT, ND, BS+ Musculoskeletal: no deformities, strength intact in all 4 Skin: moist, warm, no rashes Neurological: no tremor with outstretched hands, DTR normal in all 4  ASSESSMENT: 1. DM2, non-insulin-dependent, uncontrolled, without long-term  complications, but with hyperglycemia  2. HL  3.  Yeast vaginitis  PLAN:  1. Patient with longstanding, previously uncontrolled diabetes, on oral antidiabetic regimen and weekly GLP-1 receptor agonist.  Her sugars improved significantly on the GLP-1 receptor agonist and also SGLT 2 inhibitor, however, we have to stop Jardiance due to yeast infections.  Since then, sugars increased so we started glipizide XL.  At last visit, sugars are slightly better but her HbA1c was higher: At 6.6%.  However, she was also preparing to adjust her diet and lose weight so we did not change her regimen. -At this visit, sugars are higher; she only checks in the morning and had one blood sugar checked at the end of the day but they are all above goal.  She is also unhappy that she gained weight after stopping Jardiance and starting glipizide -At this visit, I suggested to continue the current regimen but add metformin ER.  She was on metformin in the past but her HbA1c was much higher at that time and this was not enough to lower her blood sugars.  Now that she is close to target, I think this will help.  We will start metformin ER since she is worried about diarrhea.  We will also use a half maximal dose, starting at the low dose and increasing as tolerated - I suggested to:  Patient Instructions  Please continue: - Glipizide XL 5 mg before b'fast - Ozempic 1 mg weekly  Please add: - Metformin ER 500 mg with dinner, and increase to 1000 mg with dinner in 1 week  Please come back for a follow-up appointment in 4 months.  - we checked her HbA1c: 7.1% (higher) - advised to check sugars at different times of the day - 1-2x a day, rotating check times - advised for yearly eye exams >> she is UTD - We will check annual labs now -  return to clinic in 4 months    2. HL -Reviewed latest lipid panel from 2019: All fractions at goal: Lab Results  Component Value Date   CHOL 112 06/20/2017   HDL 42 06/20/2017    LDLCALC 53 06/20/2017   TRIG 85 06/20/2017   CHOLHDL 3.6 03/30/2015  -Continues Lipitor and fenofibrate without side effects -We will check a lipid panel now -she prefers to have labs at [facility] so I printed the lab orders for her  2.  Yeast vaginitis -This has resolved after stopping Jardiance -No new symptoms  LABS NEED TO GO TO [facility] -patient's husband works there.  Philemon Kingdom, MD PhD [facility] Endocrinology

## 2019-02-14 ENCOUNTER — Other Ambulatory Visit: Payer: Self-pay | Admitting: Internal Medicine

## 2019-02-15 ENCOUNTER — Encounter: Payer: Self-pay | Admitting: Internal Medicine

## 2019-02-15 LAB — LIPID PANEL
Chol/HDL Ratio: 2.7 ratio (ref 0.0–4.4)
Cholesterol, Total: 113 mg/dL (ref 100–199)
HDL: 42 mg/dL (ref >39)
LDL Chol Calc (NIH): 53 mg/dL (ref 0–99)
Triglycerides: 93 mg/dL (ref 0–149)
VLDL Cholesterol Cal: 18 mg/dL (ref 5–40)

## 2019-02-15 LAB — CMP14+EGFR
ALT: 39 IU/L — ABNORMAL HIGH (ref 0–32)
AST: 22 IU/L (ref 0–40)
Albumin/Globulin Ratio: 2.2 (ref 1.2–2.2)
Albumin: 4.7 g/dL (ref 3.8–4.9)
Alkaline Phosphatase: 89 IU/L (ref 39–117)
BUN/Creatinine Ratio: 14 (ref 12–28)
BUN: 13 mg/dL (ref 8–27)
Bilirubin Total: 0.7 mg/dL (ref 0.0–1.2)
CO2: 23 mmol/L (ref 20–29)
Calcium: 9.9 mg/dL (ref 8.7–10.3)
Chloride: 103 mmol/L (ref 96–106)
Creatinine, Ser: 0.9 mg/dL (ref 0.57–1.00)
GFR calc Af Amer: 80 mL/min/{1.73_m2} (ref >59)
GFR calc non Af Amer: 70 mL/min/{1.73_m2} (ref >59)
Globulin, Total: 2.1 g/dL (ref 1.5–4.5)
Glucose: 155 mg/dL — ABNORMAL HIGH (ref 65–99)
Potassium: 4.6 mmol/L (ref 3.5–5.2)
Sodium: 141 mmol/L (ref 134–144)
Total Protein: 6.8 g/dL (ref 6.0–8.5)

## 2019-02-15 LAB — MICROALBUMIN / CREATININE URINE RATIO
Creatinine, Urine: 181 mg/dL
Microalb/Creat Ratio: 10 mg/g creat (ref 0–29)
Microalbumin, Urine: 18.7 ug/mL

## 2019-02-15 NOTE — Progress Notes (Signed)
Received labs from [facility] drawn on 02/14/2019: CMP: Normal with exception of a high glucose, at 155, and high ALT at 39 (0-32).  BUN/creatinine 13/0.9, GFR 70 Lipids: 113/93/42/53 ACR 10

## 2019-02-20 ENCOUNTER — Encounter: Payer: Self-pay | Admitting: Internal Medicine

## 2019-04-01 ENCOUNTER — Other Ambulatory Visit: Payer: Self-pay | Admitting: Internal Medicine

## 2019-04-02 ENCOUNTER — Other Ambulatory Visit: Payer: Self-pay | Admitting: *Deleted

## 2019-04-02 DIAGNOSIS — E785 Hyperlipidemia, unspecified: Secondary | ICD-10-CM

## 2019-04-02 DIAGNOSIS — I10 Essential (primary) hypertension: Secondary | ICD-10-CM

## 2019-04-02 DIAGNOSIS — J302 Other seasonal allergic rhinitis: Secondary | ICD-10-CM

## 2019-04-02 MED ORDER — MONTELUKAST SODIUM 10 MG PO TABS: ORAL_TABLET | ORAL | 0 refills | Status: DC

## 2019-04-02 MED ORDER — FENOFIBRATE 160 MG PO TABS: ORAL_TABLET | ORAL | 0 refills | Status: DC

## 2019-04-02 MED ORDER — LOSARTAN POTASSIUM 100 MG PO TABS: ORAL_TABLET | ORAL | 0 refills | Status: DC

## 2019-05-17 ENCOUNTER — Encounter: Payer: Self-pay | Admitting: Family Medicine

## 2019-05-20 ENCOUNTER — Encounter: Payer: Self-pay | Admitting: Family Medicine

## 2019-05-20 DIAGNOSIS — E785 Hyperlipidemia, unspecified: Secondary | ICD-10-CM

## 2019-05-20 MED ORDER — ATORVASTATIN CALCIUM 10 MG PO TABS: 10.0000 mg | ORAL_TABLET | ORAL | 0 refills | Status: DC

## 2019-05-30 ENCOUNTER — Encounter: Payer: Self-pay | Admitting: Family Medicine

## 2019-06-01 ENCOUNTER — Other Ambulatory Visit: Payer: Self-pay | Admitting: Family Medicine

## 2019-06-17 ENCOUNTER — Other Ambulatory Visit: Payer: Self-pay

## 2019-06-19 ENCOUNTER — Other Ambulatory Visit: Payer: Self-pay

## 2019-06-19 ENCOUNTER — Encounter: Payer: Self-pay | Admitting: Internal Medicine

## 2019-06-19 ENCOUNTER — Ambulatory Visit: Payer: 59 | Admitting: Internal Medicine

## 2019-06-19 ENCOUNTER — Telehealth: Payer: Self-pay

## 2019-06-19 VITALS — BP 120/70 | HR 90 | Ht 63.5 in | Wt 153.0 lb

## 2019-06-19 DIAGNOSIS — E1165 Type 2 diabetes mellitus with hyperglycemia: Secondary | ICD-10-CM

## 2019-06-19 DIAGNOSIS — E785 Hyperlipidemia, unspecified: Secondary | ICD-10-CM | POA: Diagnosis not present

## 2019-06-19 LAB — POCT GLYCOSYLATED HEMOGLOBIN (HGB A1C): Hemoglobin A1C: 6.3 % — AB (ref 4.0–5.6)

## 2019-06-19 MED ORDER — METFORMIN HCL ER 500 MG PO TB24: ORAL_TABLET | ORAL | 3 refills | Status: DC

## 2019-06-19 MED ORDER — GLIPIZIDE ER 5 MG PO TB24: 5.0000 mg | ORAL_TABLET | Freq: Every day | ORAL | 3 refills | Status: DC

## 2019-06-19 MED ORDER — ONETOUCH VERIO VI STRP: ORAL_STRIP | 12 refills | Status: DC

## 2019-06-19 MED ORDER — OZEMPIC (1 MG/DOSE) 2 MG/1.5ML ~~LOC~~ SOPN: 1.0000 mg | PEN_INJECTOR | SUBCUTANEOUS | 3 refills | Status: DC

## 2019-06-19 MED ORDER — ONETOUCH DELICA LANCETS 33G MISC: 3 refills | Status: DC

## 2019-06-19 NOTE — Patient Instructions (Signed)
Please continue: - Metformin ER 1000 mg with dinner - Glipizide XL 5 mg before b'fast - Ozempic 1 mg weekly  Please come back for a follow-up appointment in 6 months. 

## 2019-06-19 NOTE — Addendum Note (Signed)
Addended by: Darliss Ridgel I on: 06/19/2019 09:55 AM   Modules accepted: Orders

## 2019-06-19 NOTE — Progress Notes (Signed)
Patient ID: [name], female   DOB: 27-Jan-1961, 59 y.o.   MRN: [id number]   This visit occurred during the SARS-CoV-2 public health emergency.  Safety protocols were in place, including screening questions prior to the visit, additional usage of staff PPE, and extensive cleaning of exam room while observing appropriate contact time as indicated for disinfecting solutions.   HPI: Dominique [name] is a 59 y.o.-year-old female, returning for follow-up for DM2, dx in 2016, but GDM in 2000 non-insulin-dependent, uncontrolled, without long term complications.  She previously saw Dr. Loanne Drilling.  Last visit with me 4 months ago.  Reviewed HbA1c levels: Lab Results  Component Value Date   HGBA1C 7.1 (A) 02/12/2019   HGBA1C 6.6 (A) 10/12/2018   HGBA1C 5.9 (A) 06/13/2018   HGBA1C 6.5 (A) 02/22/2018   HGBA1C 7.5 06/20/2017   HGBA1C 6.4 (A) 12/09/2014   HGBA1C 11.9 (A) 03/24/2014  HbA1c 8.9%  She is on: - Metformin ER 1000 mg with dinner  - added 01/2019 - Glipizide XL 5 mg before breakfast - added 07/2018 - Ozempic 0.5 >> 1 mg weekly We have to stop Jardiance 07/2018 due to persistent yeast infections. She stopped metformin due to lack of effect. She developed a rash with Trulicity She was previously on nateglinide and also on repaglinide.  Pt checks her sugars once a day - ave 140: - am:  118-145, 155, 170 >> 160-177, 181 >> 115-144, 154, 159 - 2h after b'fast: 78, 152 >> n/c >> 135 >> n/c >> 198 - before lunch: 97 >> 105 >> n/c - 2h after lunch: 102-132 >> n/c >> 99, 161 >> n/c >> 170 - before dinner: 147 >> n/c - 2h after dinner: 132-175, 199 >> n/c >> 211 - bedtime: n/c - nighttime: n/c Lowest sugar was 78 >> 86 >> 99 >> 160 >> 112; she has hypoglycemia awareness in the 90s. Highest sugar was 199 >> 149 >> 170 >> 211 >> 200.  Glucometer: Accu-Chek guide  Pt's meals are: - Breakfast: 1-2 egg, 1-2 toast; egg + bacon - Lunch: eating out, salad, meat + veggies - Dinner:  eating out, salad, meat + veggies - Dessert: apple pie, cake - Snacks: chips  -No CKD, last BUN/creatinine:  Lab Results  Component Value Date   BUN 13 02/14/2019   BUN 15 02/22/2018   CREATININE 0.90 02/14/2019   CREATININE 0.83 02/22/2018  On losartan.  -+ HL; last set of lipids: Lab Results  Component Value Date   CHOL 113 02/14/2019   HDL 42 02/14/2019   LDLCALC 53 02/14/2019   TRIG 93 02/14/2019   CHOLHDL 2.7 02/14/2019  On Lipitor 10 mg, fenofibrate 160.  - last eye exam was in 07/2018: No DR  - no numbness and tingling in her feet  On Gabapentin for Bell's palsy. He had a kidney stone in 2019.  Pt has FH of DM in mother and father.  ROS: Constitutional: no weight gain/no weight loss, no fatigue, no subjective hyperthermia, no subjective hypothermia Eyes: no blurry vision, no xerophthalmia ENT: no sore throat, no nodules palpated in neck, no dysphagia, no odynophagia, no hoarseness Cardiovascular: no CP/no SOB/no palpitations/no leg swelling Respiratory: no cough/no SOB/no wheezing Gastrointestinal: no N/no V/no D/no C/no acid reflux Musculoskeletal: no muscle aches/no joint aches Skin: no rashes, no hair loss Neurological: no tremors/no numbness/no tingling/no dizziness  I reviewed pt's medications, allergies, PMH, social hx, family hx, and changes were documented in the history of present illness. Otherwise, unchanged from  my initial visit note.  Past Medical History:  Diagnosis Date  . Arthritis    left foot  . Asthma   . Bell palsy 2015  . Endometriosis   . GERD (gastroesophageal reflux disease)   . H/O hiatal hernia   . 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 Hypertension   . Kidney stone    Past Surgical History:  Procedure Laterality Date  . laparatomy  1985  . RADIAL HEAD ARTHROPLASTY Left 02/23/2013   Procedure: LEFT RADIAL HEAD ARTHROPLASTY qwith ligament reconstruction;  Surgeon: Roseanne Kaufman, MD;  Location: [place];   Service: Orthopedics;  Laterality: Left;  . TONSILLECTOMY AND ADENOIDECTOMY  1967  . uterine lining ablation  2002   Social History   Socioeconomic History  . Marital status: Married    Spouse name: Not on file  . Number of children: Not on file  . Years of education: Not on file  . Highest education level: Not on file  Occupational History    Comment: unemployed  Tobacco Use  . Smoking status: Never Smoker  . Smokeless tobacco: Never Used  Substance and Sexual Activity  . Alcohol use: No  . Drug use: No  . Sexual activity: Yes    Partners: Male  Other Topics Concern  . Not on file  Social History Narrative   Exercise-- no   Social Determinants of Health   Financial Resource Strain:   . Difficulty of Paying Living Expenses: Not on file  Food Insecurity:   . Worried About Charity fundraiser in the Last Year: Not on file  . Ran Out of Food in the Last Year: Not on file  Transportation Needs:   . Lack of Transportation (Medical): Not on file  . Lack of Transportation (Non-Medical): Not on file  Physical Activity:   . Days of Exercise per Week: Not on file  . Minutes of Exercise per Session: Not on file  Stress:   . Feeling of Stress : Not on file  Social Connections:   . Frequency of Communication with Friends and Family: Not on file  . Frequency of Social Gatherings with Friends and Family: Not on file  . Attends Religious Services: Not on file  . Active Member of Clubs or Organizations: Not on file  . Attends Archivist Meetings: Not on file  . Marital Status: Not on file  Intimate Partner Violence:   . Fear of Current or Ex-Partner: Not on file  . Emotionally Abused: Not on file  . Physically Abused: Not on file  . Sexually Abused: Not on file   Current Outpatient Medications on File Prior to Visit  Medication Sig Dispense Refill  . albuterol (VENTOLIN HFA) 108 (90 Base) MCG/ACT inhaler Inhale 2 puffs into the lungs every 6 (six) hours as needed for  wheezing or shortness of breath. For shortness of breath 3 Inhaler 0  . atorvastatin (LIPITOR) 10 MG tablet Take 1 tablet (10 mg total) by mouth every other day. 45 tablet 0  . Blood Glucose Monitoring Suppl (ONETOUCH VERIO) w/Device KIT 1 Device by Does not apply route daily. 1 kit 0  . fenofibrate 160 MG tablet TAKE 1 TABLET(160 MG) BY MOUTH DAILY. 90 tablet 0  . FLOVENT HFA 110 MCG/ACT inhaler INHALE 2 PUFFS INTO THE LUNGS TWICE DAILY 36 g 0  . fluticasone (FLONASE) 50 MCG/ACT nasal spray SHAKE LIQUID AND USE 2 SPRAYS IN EACH NOSTRIL DAILY 48 g 0  . gabapentin (NEURONTIN)  100 MG capsule Take 1 capsule (100 mg total) by mouth 2 (two) times daily. Must be seen prior to future refills. Please call [phone] for an appt. 180 capsule 3  . glipiZIDE (GLUCOTROL XL) 5 MG 24 hr tablet Take 1 tablet (5 mg total) by mouth daily with breakfast. 90 tablet 3  . glucose blood (ONETOUCH VERIO) test strip Used to check blood sugars three times a day. 100 each 12  . Insulin Pen Needle 32G X 4 MM MISC Use with Ozempic once weekly 100 each 0  . levalbuterol (XOPENEX) 1.25 MG/3ML nebulizer solution USE ONE VIAL BY NEBULIZATION EVERY 6 HOURS AS NEEDED FOR WHEEZING OR SHORTNESS OF BREATH 675 mL 0  . losartan (COZAAR) 100 MG tablet TAKE 1 TABLET(100 MG) BY MOUTH DAILY 90 tablet 0  . metFORMIN (GLUCOPHAGE-XR) 500 MG 24 hr tablet Take 500 mg with dinner and increase to 1000 mg with dinner in 1 week 180 tablet 3  . montelukast (SINGULAIR) 10 MG tablet TAKE 1 TABLET(10 MG) BY MOUTH AT BEDTIME 90 tablet 0  . ONETOUCH DELICA LANCETS 91M MISC Check 3x a day 200 each 3  . OZEMPIC, 1 MG/DOSE, 2 MG/1.5ML SOPN INJECT 1 MG INTO THE SKIN ONCE A WEEK 12 mL 1   No current facility-administered medications on file prior to visit.   No Known Allergies Family History  Problem Relation Age of Onset  . Diabetes Mother   . Hyperlipidemia Mother   . Hypertension Mother   . Diabetes Father   . Hyperlipidemia Father   . Hypertension  Father     PE: BP 120/70   Pulse 90   Ht 5' 3.5" (1.613 m)   Wt 153 lb (69.4 kg)   SpO2 97%   BMI 26.68 kg/m  Wt Readings from Last 3 Encounters:  06/19/19 153 lb (69.4 kg)  02/12/19 155 lb (70.3 kg)  10/12/18 151 lb (68.5 kg)   Constitutional: normal weight, in NAD Eyes: PERRLA, EOMI, no exophthalmos ENT: moist mucous membranes, no thyromegaly, no cervical lymphadenopathy Cardiovascular: RRR, No MRG Respiratory: CTA B Gastrointestinal: abdomen soft, NT, ND, BS+ Musculoskeletal: no deformities, strength intact in all 4 Skin: moist, warm, no rashes Neurological: no tremor with outstretched hands, DTR normal in all 4  ASSESSMENT: 1. DM2, non-insulin-dependent, uncontrolled, without long-term complications, but with hyperglycemia  2. HL  PLAN:  1. Patient with longstanding, uncontrolled diabetes, on oral antidiabetic regimen and weekly GLP-1 receptor agonist with improved sugars in the past after adding an SGLT2 inhibitor, however, we had to stop Jardiance due to yeast infections.  Since then, sugars increased and we started glipizide XL, and also, at last visit, we started Metformin in the ER as since she was worried about diarrhea.  I also advised her to take only half maximal dose, 1000 mg with dinner.  At last visit HbA1c was higher, at 7.1%. -After adding Metformin at last visit, sugars improved and they are only occasionally above target in the morning now.  She also had some slightly higher blood sugars later in the day but she is not taking enough in the second half of the day to understand trends. - we checked her HbA1c: 6.3% (lower) -We do not need to change her regimen for now -Refilled her diabetic medications - I suggested to:  Patient Instructions  Please continue: - Metformin ER 1000 mg with dinner - Glipizide XL 5 mg before b'fast - Ozempic 1 mg weekly  Please come back for a follow-up appointment in  6 months.  - advised to check sugars at different times of  the day - 1x a day, rotating check times - advised for yearly eye exams >> she is UTD - return to clinic in 4 months   2. HL -Review latest lipid panel from last visit: All fractions at goal Lab Results  Component Value Date   CHOL 113 02/14/2019   HDL 42 02/14/2019   LDLCALC 53 02/14/2019   TRIG 93 02/14/2019   CHOLHDL 2.7 02/14/2019  -Continues Lipitor and fenofibrate without side effects  LABS NEED TO GO TO [facility] -patient's husband works there.  Philemon Kingdom, MD PhD [facility] Endocrinology

## 2019-06-19 NOTE — Telephone Encounter (Signed)
Prior authorization for Ozempic has been approved by patient's insurance.  Coverage is effective 06/19/2019 to 06/18/2020  PA Approval/Reference Number [id number]  Approval letter has been sent to scanning.

## 2019-07-03 ENCOUNTER — Other Ambulatory Visit: Payer: Self-pay | Admitting: *Deleted

## 2019-07-03 DIAGNOSIS — J302 Other seasonal allergic rhinitis: Secondary | ICD-10-CM

## 2019-07-03 DIAGNOSIS — I10 Essential (primary) hypertension: Secondary | ICD-10-CM

## 2019-07-03 MED ORDER — MONTELUKAST SODIUM 10 MG PO TABS: ORAL_TABLET | ORAL | 0 refills | Status: DC

## 2019-07-03 MED ORDER — LOSARTAN POTASSIUM 100 MG PO TABS: ORAL_TABLET | ORAL | 0 refills | Status: DC

## 2019-07-03 NOTE — Addendum Note (Signed)
Addended by: Thelma Barge D on: 07/03/2019 08:49 AM   Modules accepted: Orders

## 2019-07-24 ENCOUNTER — Other Ambulatory Visit: Payer: Self-pay | Admitting: Internal Medicine

## 2019-07-31 ENCOUNTER — Telehealth (INDEPENDENT_AMBULATORY_CARE_PROVIDER_SITE_OTHER): Payer: 59 | Admitting: Neurology

## 2019-07-31 ENCOUNTER — Encounter: Payer: Self-pay | Admitting: Neurology

## 2019-07-31 DIAGNOSIS — G510 Bell's palsy: Secondary | ICD-10-CM

## 2019-07-31 MED ORDER — GABAPENTIN 100 MG PO CAPS: 100.0000 mg | ORAL_CAPSULE | Freq: Two times a day (BID) | ORAL | 3 refills | Status: DC

## 2019-07-31 NOTE — Progress Notes (Signed)
Virtual Visit via Video Note  I connected with Dominique [name] on 07/31/19 at 10:15 AM EDT by a video enabled telemedicine application and verified that I am speaking with the correct person using two identifiers.  Location: Patient: at her home Provider: in the office    I discussed the limitations of evaluation and management by telemedicine and the availability of in person appointments. The patient expressed understanding and agreed to proceed.  History of Present Illness: 07/31/2019 SS: Dominique [name] is a 59 year old female with history of right-sided Bell's palsy in 2014.  She remains on gabapentin with benefit.  CT scan of the brain in April 2019 was unremarkable.  She does have a sensation of right-sided swelling, like a brick, specifically whenever she gets stressed or upset.  Can also be triggered if she has been talking a lot.  She initially had right-sided Bell's palsy in November 2014, never regained full function of the right face, had another episode October 2016, and again in September 2018.  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  She is planning to have surgery on her left foot for bunion with Dr. Victorino Dike coming up. Her initial Bell's palsy in 2014, occurred at a time when she had undiagnosed diabetes with an A1c around 13, and she had just undergone surgery for a broken radial head.  She presents today for evaluation via virtual visit.  07/25/2018 SS: Dominique [name] is a 59 year old female with history of right-sided Bell's palsy in 2014. She is currently taking gabapentin 100 mg 3 times a day. She had a CT scan of the brain in April 2019 that was unremarkable.  She denies any residual right-sided pain however she reports right sided sensation of swelling, numbness specifically whenever she gets stressed or upset.  She thinks the gabapentin is helpful however she forgets to take it and would like to decrease her dose frequency.  She is currently working  with her eye doctor to help the problem of her right eye leaking and not closing all the way.  She reports she is tolerating the gabapentin well.  She denies any new problems or concerns.  She still does have some asymmetry to the right side of her mouth when she smiles and this is bothersome to her.  She presents today for follow-up unaccompanied.   Observations/Objective: Via virtual visit, facial asymmetry on the right, partial closure of the right eye with smiling, ptosis of the right eyelid at rest, asymmetry of the right eyebrow with elevation, no arm drift, gait appears intact, speech is clear and concise  Assessment and Plan: 1.  History of right-sided Bell's palsy  Is overall stable. She will remain on gabapentin 100 mg twice daily.  A refill was sent.  She is planning to have surgery on her left foot with Dr. Victorino Dike in the near future.  They may be contacting our office for consent.  At present, her diabetes remains under good control, recent A1c was 6.3.  She will follow-up in 1 year or sooner if needed.  Follow Up Instructions: 1 year 08/04/2020 10:15   I discussed the assessment and treatment plan with the patient. The patient was provided an opportunity to ask questions and all were answered. The patient agreed with the plan and demonstrated an understanding of the instructions.   The patient was advised to call back or seek an in-person evaluation if the symptoms worsen or if the condition fails to improve as anticipated.  I provided 15 minutes of non-face-to-face time during this encounter.   Evangeline Dakin, DNP  [facility] [address] [place], [place] [address] [phone]

## 2019-08-01 LAB — HM DIABETES EYE EXAM

## 2019-08-01 NOTE — Progress Notes (Signed)
I have read the note, and I agree with the clinical assessment and plan.  Soraiya Ahner K Robel Wuertz   

## 2019-08-06 ENCOUNTER — Telehealth: Payer: Self-pay | Admitting: Neurology

## 2019-08-06 ENCOUNTER — Encounter: Payer: Self-pay | Admitting: Family Medicine

## 2019-08-06 DIAGNOSIS — I10 Essential (primary) hypertension: Secondary | ICD-10-CM

## 2019-08-06 DIAGNOSIS — IMO0002 Reserved for concepts with insufficient information to code with codable children: Secondary | ICD-10-CM

## 2019-08-06 DIAGNOSIS — E1151 Type 2 diabetes mellitus with diabetic peripheral angiopathy without gangrene: Secondary | ICD-10-CM

## 2019-08-06 DIAGNOSIS — E785 Hyperlipidemia, unspecified: Secondary | ICD-10-CM

## 2019-08-06 DIAGNOSIS — J302 Other seasonal allergic rhinitis: Secondary | ICD-10-CM

## 2019-08-06 MED ORDER — LOSARTAN POTASSIUM 100 MG PO TABS: ORAL_TABLET | ORAL | 0 refills | Status: DC

## 2019-08-06 MED ORDER — FENOFIBRATE 160 MG PO TABS: ORAL_TABLET | ORAL | 0 refills | Status: DC

## 2019-08-06 MED ORDER — MONTELUKAST SODIUM 10 MG PO TABS: ORAL_TABLET | ORAL | 0 refills | Status: DC

## 2019-08-06 MED ORDER — BLOOD GLUCOSE METER KIT: PACK | 0 refills | Status: DC

## 2019-08-06 NOTE — Telephone Encounter (Signed)
I received a surgical clearance for Dominique [name] for a left foot bunionectomy/choice anesthesia by Dr. Toni Arthurs.  We follow the patient for history of a right-sided Bell's palsy (occurring in 2014, 2016, and 2018).  The initial Bell's palsy occurred in 2014 when she had just undergone surgery for a broken radial head, she had undiagnosed diabetes with an A1c around 13.  Her diabetes is now under good control, A1c in February was 6.3.  I feel she would be able to undergo surgery, since she has done well, but will run this by Dr. Anne Hahn.  I called the patient and let her know.  We will have the paperwork for her by next Monday.

## 2019-08-07 ENCOUNTER — Other Ambulatory Visit: Payer: Self-pay | Admitting: Family Medicine

## 2019-08-07 MED ORDER — LOSARTAN POTASSIUM 100 MG PO TABS: ORAL_TABLET | ORAL | 0 refills | Status: DC

## 2019-08-07 MED ORDER — MONTELUKAST SODIUM 10 MG PO TABS: ORAL_TABLET | ORAL | 0 refills | Status: DC

## 2019-08-07 MED ORDER — FENOFIBRATE 160 MG PO TABS: ORAL_TABLET | ORAL | 0 refills | Status: DC

## 2019-08-08 ENCOUNTER — Other Ambulatory Visit: Payer: Self-pay

## 2019-08-08 NOTE — Telephone Encounter (Signed)
Faxed to Dr. Victorino Dike [phone] and also Tresa Endo at [phone].  Received fax confirmation.

## 2019-08-08 NOTE — Telephone Encounter (Signed)
Signed paperwork for surgical clearance, okayed by Dr. Anne Hahn. I called the patient. Please fax.

## 2019-08-09 ENCOUNTER — Other Ambulatory Visit: Payer: Self-pay

## 2019-08-09 ENCOUNTER — Encounter: Payer: Self-pay | Admitting: Family Medicine

## 2019-08-09 ENCOUNTER — Ambulatory Visit: Payer: 59 | Admitting: Family Medicine

## 2019-08-09 DIAGNOSIS — I10 Essential (primary) hypertension: Secondary | ICD-10-CM | POA: Diagnosis not present

## 2019-08-09 DIAGNOSIS — E785 Hyperlipidemia, unspecified: Secondary | ICD-10-CM | POA: Diagnosis not present

## 2019-08-09 DIAGNOSIS — E1165 Type 2 diabetes mellitus with hyperglycemia: Secondary | ICD-10-CM

## 2019-08-09 DIAGNOSIS — J302 Other seasonal allergic rhinitis: Secondary | ICD-10-CM

## 2019-08-09 DIAGNOSIS — J45909 Unspecified asthma, uncomplicated: Secondary | ICD-10-CM | POA: Diagnosis not present

## 2019-08-09 DIAGNOSIS — J4521 Mild intermittent asthma with (acute) exacerbation: Secondary | ICD-10-CM

## 2019-08-09 MED ORDER — ATORVASTATIN CALCIUM 10 MG PO TABS: 10.0000 mg | ORAL_TABLET | ORAL | 1 refills | Status: DC

## 2019-08-09 MED ORDER — FENOFIBRATE 160 MG PO TABS: ORAL_TABLET | ORAL | 1 refills | Status: DC

## 2019-08-09 MED ORDER — LEVALBUTEROL HCL 1.25 MG/3ML IN NEBU: INHALATION_SOLUTION | RESPIRATORY_TRACT | 0 refills | Status: DC

## 2019-08-09 MED ORDER — PROAIR RESPICLICK 108 (90 BASE) MCG/ACT IN AEPB: 2.0000 | INHALATION_SPRAY | Freq: Four times a day (QID) | RESPIRATORY_TRACT | 5 refills | Status: DC

## 2019-08-09 MED ORDER — LOSARTAN POTASSIUM 100 MG PO TABS: ORAL_TABLET | ORAL | 1 refills | Status: DC

## 2019-08-09 MED ORDER — MONTELUKAST SODIUM 10 MG PO TABS: ORAL_TABLET | ORAL | 1 refills | Status: DC

## 2019-08-09 MED ORDER — FLOVENT HFA 110 MCG/ACT IN AERO: 2.0000 | INHALATION_SPRAY | Freq: Two times a day (BID) | RESPIRATORY_TRACT | 0 refills | Status: DC

## 2019-08-09 NOTE — Progress Notes (Signed)
Patient ID: Dominique [name], female    DOB: 1960-06-30  Age: 59 y.o. MRN: [id number]    Subjective:  Subjective  HPI [name] presents for f/u and med refill   HPI HYPERTENSION   Blood pressure range-not checking   Chest pain- no      Dyspnea- no Lightheadedness- no   Edema- no  Other side effects - no   Medication compliance: good Low salt diet- yes    DIABETES    Blood Sugar ranges-not checking   Polyuria- no New Visual problems- no  Hypoglycemic symptoms- no  Other side effects-no Medication compliance - good Last eye exam- suw Foot exam- endo   HYPERLIPIDEMIA  Medication compliance- good RUQ pain- no  Muscle aches- no Other side effects-no      Review of Systems  Constitutional: Negative for appetite change, diaphoresis, fatigue and unexpected weight change.  Eyes: Negative for pain, redness and visual disturbance.  Respiratory: Negative for cough, chest tightness, shortness of breath and wheezing.   Cardiovascular: Negative for chest pain, palpitations and leg swelling.  Endocrine: Negative for cold intolerance, heat intolerance, polydipsia, polyphagia and polyuria.  Genitourinary: Negative for difficulty urinating, dysuria and frequency.  Neurological: Negative for dizziness, light-headedness, numbness and headaches.    History Past Medical History:  Diagnosis Date  . Arthritis    left foot  . Asthma   . Bell palsy 2015  . Endometriosis   . GERD (gastroesophageal reflux disease)   . H/O hiatal hernia   . 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 Hypertension   . Kidney stone     She has a past surgical history that includes Tonsillectomy and adenoidectomy (1967); uterine lining ablation (2002); laparatomy (1985); and Radial head arthroplasty (Left, 02/23/2013).   Her family history includes Diabetes in her father and mother; Hyperlipidemia in her father and mother; Hypertension in her father and mother.She reports that she  has never smoked. She has never used smokeless tobacco. She reports that she does not drink alcohol or use drugs.  Current Outpatient Medications on File Prior to Visit  Medication Sig Dispense Refill  . blood glucose meter kit and supplies Dispense based on patient and insurance preference. Use up to four times daily as directed. (FOR ICD-10 E10.9, E11.9). 1 each 0  . fluticasone (FLONASE) 50 MCG/ACT nasal spray SHAKE LIQUID AND USE 2 SPRAYS IN EACH NOSTRIL DAILY 48 g 0  . gabapentin (NEURONTIN) 100 MG capsule Take 1 capsule (100 mg total) by mouth 2 (two) times daily. Must be seen prior to future refills. 180 capsule 3  . glipiZIDE (GLUCOTROL XL) 5 MG 24 hr tablet TAKE 1 TABLET(5 MG) BY MOUTH DAILY WITH BREAKFAST 90 tablet 3  . glucose blood (ONETOUCH VERIO) test strip USE UP TO FOUR TIMES DAILY AS DIRECTED 400 strip 1  . metFORMIN (GLUCOPHAGE-XR) 500 MG 24 hr tablet Take 1000 mg with dinner 180 tablet 3  . Semaglutide, 1 MG/DOSE, (OZEMPIC, 1 MG/DOSE,) 2 MG/1.5ML SOPN Inject 1 mg into the skin once a week. 12 pen 3   No current facility-administered medications on file prior to visit.     Objective:  Objective  Physical Exam Constitutional:      Appearance: She is well-developed.  HENT:     Head: Normocephalic and atraumatic.  Eyes:     Conjunctiva/sclera: Conjunctivae normal.  Neck:     Thyroid: No thyromegaly.     Vascular: No carotid bruit or JVD.  Cardiovascular:  Rate and Rhythm: Normal rate and regular rhythm.     Heart sounds: Normal heart sounds. No murmur.  Pulmonary:     Effort: Pulmonary effort is normal. No respiratory distress.     Breath sounds: Normal breath sounds. No wheezing or rales.  Chest:     Chest wall: No tenderness.  Musculoskeletal:     Cervical back: Normal range of motion and neck supple.  Neurological:     Mental Status: She is alert and oriented to person, place, and time.    BP 118/80 (BP Location: Left Arm, Patient Position: Sitting, Cuff  Size: Normal)   Pulse 95   Temp (!) 97 F (36.1 C) (Temporal)   Resp 18   Ht 5' 3.5" (1.613 m)   Wt 152 lb 6.4 oz (69.1 kg)   SpO2 98%   BMI 26.57 kg/m  Wt Readings from Last 3 Encounters:  08/09/19 152 lb 6.4 oz (69.1 kg)  06/19/19 153 lb (69.4 kg)  02/12/19 155 lb (70.3 kg)     Lab Results  Component Value Date   WBC 4.9 03/24/2014   HGB 16.5 (A) 03/24/2014   HCT 48 (A) 03/24/2014   PLT 166 03/24/2014   GLUCOSE 89 08/09/2019   CHOL 136 08/09/2019   TRIG 128 08/09/2019   HDL 42 08/09/2019   LDLCALC 71 08/09/2019   ALT 46 (H) 08/09/2019   AST 31 08/09/2019   NA 143 08/09/2019   K 4.2 08/09/2019   CL 107 (H) 08/09/2019   CREATININE 0.79 08/09/2019   BUN 13 08/09/2019   CO2 21 08/09/2019   TSH 1.56 03/24/2014   HGBA1C 6.3 (A) 06/19/2019    VAS [place] ABI WITH/WO TBI  Result Date: 10/16/2018 LOWER EXTREMITY DOPPLER STUDY Indications: Pain when walking. High Risk Factors: Hypertension, hyperlipidemia, Diabetes.  Performing Technologist: Cardell Peach RDCS, RVT  Examination Guidelines: A complete evaluation includes at minimum, Doppler waveform signals and systolic blood pressure reading at the level of bilateral brachial, anterior tibial, and posterior tibial arteries, when vessel segments are accessible. Bilateral testing is considered an integral part of a complete examination. Photoelectric Plethysmograph (PPG) waveforms and toe systolic pressure readings are included as required and additional duplex testing as needed. Limited examinations for reoccurring indications may be performed as noted.  ABI Findings: +---------+------------------+-----+---------+--------+ Right    Rt Pressure (mmHg)IndexWaveform Comment  +---------+------------------+-----+---------+--------+ Brachial 127                    triphasic         +---------+------------------+-----+---------+--------+ ATA      142               1.12 triphasic          +---------+------------------+-----+---------+--------+ PTA      144               1.13 triphasic         +---------+------------------+-----+---------+--------+ Great Toe128               1.01 Normal            +---------+------------------+-----+---------+--------+ +---------+------------------+-----+---------+-------+ Left     Lt Pressure (mmHg)IndexWaveform Comment +---------+------------------+-----+---------+-------+ Brachial 123                    triphasic        +---------+------------------+-----+---------+-------+ ATA      145               1.14 triphasic        +---------+------------------+-----+---------+-------+  PTA      142               1.12 triphasic        +---------+------------------+-----+---------+-------+ Great Toe120               0.94 Normal           +---------+------------------+-----+---------+-------+ +-------+-----------+-----------+------------+------------+ ABI/TBIToday's ABIToday's TBIPrevious ABIPrevious TBI +-------+-----------+-----------+------------+------------+ Right  1.13                                           +-------+-----------+-----------+------------+------------+ Left   1.12                                           +-------+-----------+-----------+------------+------------+  Summary: Right: Resting right ankle-brachial index is within normal range. No evidence of significant right lower extremity arterial disease. The right toe-brachial index is normal. Left: Resting left ankle-brachial index is within normal range. No evidence of significant left lower extremity arterial disease. The left toe-brachial index is normal.  *See table(s) above for measurements and observations.  Electronically signed by Shirlee More MD on 10/16/2018 at 5:12:21 PM.   Final      Assessment & Plan:  Plan  I have discontinued Winifred Olive. Winchel "Trisha"'s albuterol and OneTouch Delica Lancets 38G. I have also changed her  Flovent HFA, fenofibrate, losartan, and montelukast. Additionally, I am having her start on ProAir RespiClick. Lastly, I am having her maintain her fluticasone, Ozempic (1 MG/DOSE), metFORMIN, glipiZIDE, gabapentin, blood glucose meter kit and supplies, OneTouch Verio, levalbuterol, and atorvastatin.  Meds ordered this encounter  Medications  . fluticasone (FLOVENT HFA) 110 MCG/ACT inhaler    Sig: Inhale 2 puffs into the lungs 2 (two) times daily.    Dispense:  36 g    Refill:  0    **Patient requests 90 days supply**  . levalbuterol (XOPENEX) 1.25 MG/3ML nebulizer solution    Sig: USE ONE VIAL BY NEBULIZATION EVERY 6 HOURS AS NEEDED FOR WHEEZING OR SHORTNESS OF BREATH    Dispense:  675 mL    Refill:  0    **Patient requests 90 days supply**  . fenofibrate 160 MG tablet    Sig: TAKE 1 TABLET(160 MG) BY MOUTH DAILY.    Dispense:  90 tablet    Refill:  1  . atorvastatin (LIPITOR) 10 MG tablet    Sig: Take 1 tablet (10 mg total) by mouth every other day.    Dispense:  45 tablet    Refill:  1  . losartan (COZAAR) 100 MG tablet    Sig: TAKE 1 TABLET(100 MG) BY MOUTH DAILY.    Dispense:  90 tablet    Refill:  1  . montelukast (SINGULAIR) 10 MG tablet    Sig: TAKE 1 TABLET(10 MG) BY MOUTH AT BEDTIME.    Dispense:  90 tablet    Refill:  1  . Albuterol Sulfate (PROAIR RESPICLICK) 536 (90 Base) MCG/ACT AEPB    Sig: Inhale 2 Inhalers into the lungs 4 (four) times daily.    Dispense:  1 each    Refill:  5    Problem List Items Addressed This Visit      Unprioritized   Asthma with exacerbation    Stable and controlled con't inhalers  Relevant Medications   fluticasone (FLOVENT HFA) 110 MCG/ACT inhaler   levalbuterol (XOPENEX) 1.25 MG/3ML nebulizer solution   montelukast (SINGULAIR) 10 MG tablet   Albuterol Sulfate (PROAIR RESPICLICK) 919 (90 Base) MCG/ACT AEPB   Essential hypertension    Well controlled, no changes to meds. Encouraged heart healthy diet such as the DASH diet  and exercise as tolerated.       Relevant Medications   fenofibrate 160 MG tablet   atorvastatin (LIPITOR) 10 MG tablet   losartan (COZAAR) 100 MG tablet   Hyperlipidemia    Encouraged heart healthy diet, increase exercise, avoid trans fats, consider a krill oil cap daily      Relevant Medications   fenofibrate 160 MG tablet   atorvastatin (LIPITOR) 10 MG tablet   losartan (COZAAR) 100 MG tablet   Other Relevant Orders   Lipid panel (Completed)   Comprehensive metabolic panel (Completed)   Type 2 diabetes mellitus with hyperglycemia, without long-term current use of insulin (HCC)    hgba1c to be checked , minimize simple carbs. Increase exercise as tolerated. Continue current meds       Relevant Medications   atorvastatin (LIPITOR) 10 MG tablet   losartan (COZAAR) 100 MG tablet    Other Visit Diagnoses    Asthma, chronic, unspecified asthma severity, uncomplicated       Relevant Medications   fluticasone (FLOVENT HFA) 110 MCG/ACT inhaler   levalbuterol (XOPENEX) 1.25 MG/3ML nebulizer solution   montelukast (SINGULAIR) 10 MG tablet   Albuterol Sulfate (PROAIR RESPICLICK) 802 (90 Base) MCG/ACT AEPB   Seasonal allergies       Relevant Medications   fluticasone (FLOVENT HFA) 110 MCG/ACT inhaler   montelukast (SINGULAIR) 10 MG tablet      Follow-up: Return in about 6 months (around 02/09/2020), or if symptoms worsen or fail to improve, for annual exam, fasting.  Ann Held, DO

## 2019-08-09 NOTE — Patient Instructions (Signed)
http://www.aaaai.org/conditions-and-treatments/asthma">  Asthma, Adult  Asthma is a long-term (chronic) condition that causes recurrent episodes in which the airways become tight and narrow. The airways are the passages that lead from the nose and mouth down into the lungs. Asthma episodes, also called asthma attacks, can cause coughing, wheezing, shortness of breath, and chest pain. The airways can also fill with mucus. During an attack, it can be difficult to breathe. Asthma attacks can range from minor to life threatening. Asthma cannot be cured, but medicines and lifestyle changes can help control it and treat acute attacks. What are the causes? This condition is believed to be caused by inherited (genetic) and environmental factors, but its exact cause is not known. There are many things that can bring on an asthma attack or make asthma symptoms worse (triggers). Asthma triggers are different for each person. Common triggers include:  Mold.  Dust.  Cigarette smoke.  Cockroaches.  Things that can cause allergy symptoms (allergens), such as animal dander or pollen from trees or grass.  Air pollutants such as household cleaners, wood smoke, smog, or chemical odors.  Cold air, weather changes, and winds (which increase molds and pollen in the air).  Strong emotional expressions such as crying or laughing hard.  Stress.  Certain medicines (such as aspirin) or types of medicines (such as beta-blockers).  Sulfites in foods and drinks. Foods and drinks that may contain sulfites include dried fruit, potato chips, and sparkling grape juice.  Infections or inflammatory conditions such as the flu, a cold, or inflammation of the nasal membranes (rhinitis).  Gastroesophageal reflux disease (GERD).  Exercise or strenuous activity. What are the signs or symptoms? Symptoms of this condition may occur right after asthma is triggered or many hours later. Symptoms include:  Wheezing. This can  sound like whistling when you breathe.  Excessive nighttime or early morning coughing.  Frequent or severe coughing with a common cold.  Chest tightness.  Shortness of breath.  Tiredness (fatigue) with minimal activity. How is this diagnosed? This condition is diagnosed based on:  Your medical history.  A physical exam.  Tests, which may include: ? Lung function studies and pulmonary studies (spirometry). These tests can evaluate the flow of air in your lungs. ? Allergy tests. ? Imaging tests, such as X-rays. How is this treated? There is no cure for this condition, but treatment can help control your symptoms. Treatment for asthma usually involves:  Identifying and avoiding your asthma triggers.  Using medicines to control your symptoms. Generally, two types of medicines are used to treat asthma: ? Controller medicines. These help prevent asthma symptoms from occurring. They are usually taken every day. ? Fast-acting reliever or rescue medicines. These quickly relieve asthma symptoms by widening the narrow and tight airways. They are used as needed and provide short-term relief.  Using supplemental oxygen. This may be needed during a severe episode.  Using other medicines, such as: ? Allergy medicines, such as antihistamines, if your asthma attacks are triggered by allergens. ? Immune medicines (immunomodulators). These are medicines that help control the immune system.  Creating an asthma action plan. An asthma action plan is a written plan for managing and treating your asthma attacks. This plan includes: ? A list of your asthma triggers and how to avoid them. ? Information about when medicines should be taken and when their dosage should be changed. ? Instructions about using a device called a peak flow meter. A peak flow meter measures how well the lungs are working   and the severity of your asthma. It helps you monitor your condition. Follow these instructions at  home: Controlling your home environment Control your home environment in the following ways to help avoid triggers and prevent asthma attacks:  Change your heating and air conditioning filter regularly.  Limit your use of fireplaces and wood stoves.  Get rid of pests (such as roaches and mice) and their droppings.  Throw away plants if you see mold on them.  Clean floors and dust surfaces regularly. Use unscented cleaning products.  Try to have someone else vacuum for you regularly. Stay out of rooms while they are being vacuumed and for a short while afterward. If you vacuum, use a dust mask from a hardware store, a double-layered or microfilter vacuum cleaner bag, or a vacuum cleaner with a HEPA filter.  Replace carpet with wood, tile, or vinyl flooring. Carpet can trap dander and dust.  Use allergy-proof pillows, mattress covers, and box spring covers.  Keep your bedroom a trigger-free room.  Avoid pets and keep windows closed when allergens are in the air.  Wash beddings every week in hot water and dry them in a dryer.  Use blankets that are made of polyester or cotton.  Clean bathrooms and kitchens with bleach. If possible, have someone repaint the walls in these rooms with mold-resistant paint. Stay out of the rooms that are being cleaned and painted.  Wash your hands often with soap and water. If soap and water are not available, use hand sanitizer.  Do not allow anyone to smoke in your home. General instructions  Take over-the-counter and prescription medicines only as told by your health care provider. ? Speak with your health care provider if you have questions about how or when to take the medicines. ? Make note if you are requiring more frequent dosages.  Do not use any products that contain nicotine or tobacco, such as cigarettes and e-cigarettes. If you need help quitting, ask your health care provider. Also, avoid being exposed to secondhand smoke.  Use a peak  flow meter as told by your health care provider. Record and keep track of the readings.  Understand and use the asthma action plan to help minimize, or stop an asthma attack, without needing to seek medical care.  Make sure you stay up to date on your yearly vaccinations as told by your health care provider. This may include vaccines for the flu and pneumonia.  Avoid outdoor activities when allergen counts are high and when air quality is low.  Wear a ski mask that covers your nose and mouth during outdoor winter activities. Exercise indoors on cold days if you can.  Warm up before exercising, and take time for a cool-down period after exercise.  Keep all follow-up visits as told by your health care provider. This is important. Where to find more information  For information about asthma, turn to the Centers for Disease Control and Prevention at www.cdc.gov/asthma/faqs.htm  For air quality information, turn to AirNow at https://airnow.gov/ Contact a health care provider if:  You have wheezing, shortness of breath, or a cough even while you are taking medicine to prevent attacks.  The mucus you cough up (sputum) is thicker than usual.  Your sputum changes from clear or white to yellow, green, gray, or bloody.  Your medicines are causing side effects, such as a rash, itching, swelling, or trouble breathing.  You need to use a reliever medicine more than 2-3 times a week.  Your peak   flow reading is still at 50-79% of your personal best after following your action plan for 1 hour.  You have a fever. Get help right away if:  You are getting worse and do not respond to treatment during an asthma attack.  You are short of breath when at rest or when doing very little physical activity.  You have difficulty eating, drinking, or talking.  You have chest pain or tightness.  You develop a fast heartbeat or palpitations.  You have a bluish color to your lips or fingernails.  You  are light-headed or dizzy, or you faint.  Your peak flow reading is less than 50% of your personal best.  You feel too tired to breathe normally. Summary  Asthma is a long-term (chronic) condition that causes recurrent episodes in which the airways become tight and narrow. These episodes can cause coughing, wheezing, shortness of breath, and chest pain.  Asthma cannot be cured, but medicines and lifestyle changes can help control it and treat acute attacks.  Make sure you understand how to avoid triggers and how and when to use your medicines.  Asthma attacks can range from minor to life threatening. Get help right away if you have an asthma attack and do not respond to treatment with your usual rescue medicines. This information is not intended to replace advice given to you by your health care provider. Make sure you discuss any questions you have with your health care provider. Document Revised: 07/05/2018 Document Reviewed: 06/06/2016 Elsevier Patient Education  2020 [employer]  

## 2019-08-10 LAB — COMPREHENSIVE METABOLIC PANEL
ALT: 46 IU/L — ABNORMAL HIGH (ref 0–32)
AST: 31 IU/L (ref 0–40)
Albumin/Globulin Ratio: 2.3 — ABNORMAL HIGH (ref 1.2–2.2)
Albumin: 4.8 g/dL (ref 3.8–4.9)
Alkaline Phosphatase: 83 IU/L (ref 39–117)
BUN/Creatinine Ratio: 16 (ref 12–28)
BUN: 13 mg/dL (ref 8–27)
Bilirubin Total: 0.8 mg/dL (ref 0.0–1.2)
CO2: 21 mmol/L (ref 20–29)
Calcium: 10 mg/dL (ref 8.7–10.3)
Chloride: 107 mmol/L — ABNORMAL HIGH (ref 96–106)
Creatinine, Ser: 0.79 mg/dL (ref 0.57–1.00)
GFR calc Af Amer: 94 mL/min/{1.73_m2} (ref >59)
GFR calc non Af Amer: 82 mL/min/{1.73_m2} (ref >59)
Globulin, Total: 2.1 g/dL (ref 1.5–4.5)
Glucose: 89 mg/dL (ref 65–99)
Potassium: 4.2 mmol/L (ref 3.5–5.2)
Sodium: 143 mmol/L (ref 134–144)
Total Protein: 6.9 g/dL (ref 6.0–8.5)

## 2019-08-10 LAB — LIPID PANEL
Chol/HDL Ratio: 3.2 ratio (ref 0.0–4.4)
Cholesterol, Total: 136 mg/dL (ref 100–199)
HDL: 42 mg/dL (ref >39)
LDL Chol Calc (NIH): 71 mg/dL (ref 0–99)
Triglycerides: 128 mg/dL (ref 0–149)
VLDL Cholesterol Cal: 23 mg/dL (ref 5–40)

## 2019-08-10 NOTE — Assessment & Plan Note (Signed)
Well controlled, no changes to meds. Encouraged heart healthy diet such as the DASH diet and exercise as tolerated.  °

## 2019-08-10 NOTE — Assessment & Plan Note (Signed)
Stable and controlled con't inhalers

## 2019-08-10 NOTE — Assessment & Plan Note (Addendum)
hgba1c to be checked, minimize simple carbs. Increase exercise as tolerated. Continue current meds  

## 2019-08-10 NOTE — Assessment & Plan Note (Signed)
Encouraged heart healthy diet, increase exercise, avoid trans fats, consider a krill oil cap daily 

## 2019-08-14 ENCOUNTER — Encounter: Payer: Self-pay | Admitting: Internal Medicine

## 2019-08-15 MED ORDER — CONTOUR NEXT TEST VI STRP: ORAL_STRIP | 12 refills | Status: DC

## 2019-08-15 MED ORDER — CONTOUR NEXT ONE KIT: 1.0000 | PACK | 0 refills | Status: DC

## 2019-08-19 ENCOUNTER — Other Ambulatory Visit (HOSPITAL_COMMUNITY): Payer: Self-pay | Admitting: Orthopedic Surgery

## 2019-08-21 ENCOUNTER — Other Ambulatory Visit: Payer: Self-pay | Admitting: Family Medicine

## 2019-08-21 ENCOUNTER — Telehealth: Payer: Self-pay | Admitting: Family Medicine

## 2019-08-21 DIAGNOSIS — J4520 Mild intermittent asthma, uncomplicated: Secondary | ICD-10-CM

## 2019-08-21 MED ORDER — ALBUTEROL SULFATE HFA 108 (90 BASE) MCG/ACT IN AERS: 2.0000 | INHALATION_SPRAY | Freq: Four times a day (QID) | RESPIRATORY_TRACT | 3 refills | Status: DC | PRN

## 2019-08-21 NOTE — Telephone Encounter (Signed)
Patient states that she just wants PRO-AIR Not Respiclick.   Albuterol Sulfate (PROAIR RESPICLICK) 108 (90 Base) MCG/ACT AEPB [[id number]]

## 2019-08-21 NOTE — Telephone Encounter (Signed)
Pt requesting change. Please advise

## 2019-08-21 NOTE — Telephone Encounter (Signed)
I changed it 

## 2019-08-27 ENCOUNTER — Encounter (HOSPITAL_BASED_OUTPATIENT_CLINIC_OR_DEPARTMENT_OTHER): Payer: Self-pay | Admitting: Orthopedic Surgery

## 2019-08-27 ENCOUNTER — Other Ambulatory Visit: Payer: Self-pay

## 2019-08-28 ENCOUNTER — Encounter (HOSPITAL_BASED_OUTPATIENT_CLINIC_OR_DEPARTMENT_OTHER)
Admission: RE | Admit: 2019-08-28 | Discharge: 2019-08-28 | Disposition: A | Discharge status: Home / Self Care | Payer: 59 | Source: Ambulatory Visit | Attending: Orthopedic Surgery | Admitting: Orthopedic Surgery

## 2019-08-28 DIAGNOSIS — Z01812 Encounter for preprocedural laboratory examination: Secondary | ICD-10-CM | POA: Diagnosis present

## 2019-08-28 NOTE — Progress Notes (Signed)

## 2019-09-02 ENCOUNTER — Other Ambulatory Visit (HOSPITAL_COMMUNITY)
Admission: RE | Admit: 2019-09-02 | Discharge: 2019-09-02 | Disposition: A | Discharge status: Home / Self Care | Payer: 59 | Source: Ambulatory Visit | Attending: Orthopedic Surgery | Admitting: Orthopedic Surgery

## 2019-09-02 DIAGNOSIS — Z01812 Encounter for preprocedural laboratory examination: Secondary | ICD-10-CM | POA: Insufficient documentation

## 2019-09-02 DIAGNOSIS — Z20822 Contact with and (suspected) exposure to covid-19: Secondary | ICD-10-CM | POA: Insufficient documentation

## 2019-09-02 LAB — SARS CORONAVIRUS 2 (TAT 6-24 HRS): SARS Coronavirus 2: NEGATIVE

## 2019-09-05 ENCOUNTER — Ambulatory Visit (HOSPITAL_BASED_OUTPATIENT_CLINIC_OR_DEPARTMENT_OTHER): Payer: 59 | Admitting: Certified Registered"

## 2019-09-05 ENCOUNTER — Encounter (HOSPITAL_BASED_OUTPATIENT_CLINIC_OR_DEPARTMENT_OTHER): Payer: Self-pay | Admitting: Orthopedic Surgery

## 2019-09-05 ENCOUNTER — Other Ambulatory Visit: Payer: Self-pay

## 2019-09-05 ENCOUNTER — Encounter (HOSPITAL_BASED_OUTPATIENT_CLINIC_OR_DEPARTMENT_OTHER)
Admission: RE | Disposition: A | Discharge status: Home / Self Care | Payer: Self-pay | Source: Home / Self Care | Attending: Orthopedic Surgery

## 2019-09-05 ENCOUNTER — Ambulatory Visit (HOSPITAL_BASED_OUTPATIENT_CLINIC_OR_DEPARTMENT_OTHER)
Admission: RE | Admit: 2019-09-05 | Discharge: 2019-09-05 | Disposition: A | Discharge status: Home / Self Care | Payer: 59 | Attending: Orthopedic Surgery | Admitting: Orthopedic Surgery

## 2019-09-05 DIAGNOSIS — I10 Essential (primary) hypertension: Secondary | ICD-10-CM | POA: Insufficient documentation

## 2019-09-05 DIAGNOSIS — Z79899 Other long term (current) drug therapy: Secondary | ICD-10-CM | POA: Diagnosis not present

## 2019-09-05 DIAGNOSIS — E119 Type 2 diabetes mellitus without complications: Secondary | ICD-10-CM | POA: Diagnosis not present

## 2019-09-05 DIAGNOSIS — J45909 Unspecified asthma, uncomplicated: Secondary | ICD-10-CM | POA: Diagnosis not present

## 2019-09-05 DIAGNOSIS — Z7951 Long term (current) use of inhaled steroids: Secondary | ICD-10-CM | POA: Insufficient documentation

## 2019-09-05 DIAGNOSIS — M19072 Primary osteoarthritis, left ankle and foot: Secondary | ICD-10-CM | POA: Diagnosis not present

## 2019-09-05 DIAGNOSIS — M21612 Bunion of left foot: Secondary | ICD-10-CM | POA: Insufficient documentation

## 2019-09-05 DIAGNOSIS — Z7984 Long term (current) use of oral hypoglycemic drugs: Secondary | ICD-10-CM | POA: Diagnosis not present

## 2019-09-05 DIAGNOSIS — K219 Gastro-esophageal reflux disease without esophagitis: Secondary | ICD-10-CM | POA: Insufficient documentation

## 2019-09-05 HISTORY — PX: FOOT ARTHRODESIS: SHX1655

## 2019-09-05 HISTORY — DX: Calculus of kidney: N20.0

## 2019-09-05 HISTORY — DX: Type 2 diabetes mellitus without complications: E11.9

## 2019-09-05 LAB — GLUCOSE, CAPILLARY
Glucose-Capillary: 123 mg/dL — ABNORMAL HIGH (ref 70–99)
Glucose-Capillary: 143 mg/dL — ABNORMAL HIGH (ref 70–99)

## 2019-09-05 SURGERY — FUSION, JOINT, FOOT
Anesthesia: Regional | Site: Foot | Laterality: Left

## 2019-09-05 MED ORDER — ONDANSETRON HCL 4 MG/2ML IJ SOLN
INTRAMUSCULAR | Status: DC | PRN
  Administered 2019-09-05: 4 mg via INTRAVENOUS

## 2019-09-05 MED ORDER — MIDAZOLAM HCL 2 MG/2ML IJ SOLN
1.0000 mg | INTRAMUSCULAR | Status: DC | PRN
  Administered 2019-09-05: 08:00:00 2 mg via INTRAVENOUS

## 2019-09-05 MED ORDER — PHENYLEPHRINE 40 MCG/ML (10ML) SYRINGE FOR IV PUSH (FOR BLOOD PRESSURE SUPPORT)
PREFILLED_SYRINGE | INTRAVENOUS | Status: AC
  Filled 2019-09-05: qty 20

## 2019-09-05 MED ORDER — SODIUM CHLORIDE 0.9 % IV SOLN: INTRAVENOUS | Status: DC

## 2019-09-05 MED ORDER — BUPIVACAINE LIPOSOME 1.3 % IJ SUSP
INTRAMUSCULAR | Status: DC | PRN
  Administered 2019-09-05: 10 mL via PERINEURAL

## 2019-09-05 MED ORDER — FENTANYL CITRATE (PF) 100 MCG/2ML IJ SOLN
INTRAMUSCULAR | Status: DC | PRN
  Administered 2019-09-05 (×2): 25 ug via INTRAVENOUS

## 2019-09-05 MED ORDER — DOCUSATE SODIUM 100 MG PO CAPS: 100.0000 mg | ORAL_CAPSULE | Freq: Every day | ORAL | 2 refills | Status: DC | PRN

## 2019-09-05 MED ORDER — BUPIVACAINE HCL (PF) 0.5 % IJ SOLN
INTRAMUSCULAR | Status: DC | PRN
  Administered 2019-09-05: 15 mL via PERINEURAL

## 2019-09-05 MED ORDER — CEFAZOLIN SODIUM-DEXTROSE 2-4 GM/100ML-% IV SOLN
2.0000 g | INTRAVENOUS | Status: AC
  Administered 2019-09-05: 09:00:00 2 g via INTRAVENOUS

## 2019-09-05 MED ORDER — ACETAMINOPHEN 500 MG PO TABS
ORAL_TABLET | ORAL | Status: AC
  Filled 2019-09-05: qty 2

## 2019-09-05 MED ORDER — OXYCODONE HCL 5 MG PO TABS: 5.0000 mg | ORAL_TABLET | ORAL | 0 refills | Status: AC | PRN

## 2019-09-05 MED ORDER — FENTANYL CITRATE (PF) 100 MCG/2ML IJ SOLN
INTRAMUSCULAR | Status: AC
  Filled 2019-09-05: qty 2

## 2019-09-05 MED ORDER — EPHEDRINE SULFATE 50 MG/ML IJ SOLN
INTRAMUSCULAR | Status: DC | PRN
  Administered 2019-09-05: 10 mg via INTRAVENOUS

## 2019-09-05 MED ORDER — FENTANYL CITRATE (PF) 100 MCG/2ML IJ SOLN
INTRAMUSCULAR | Status: AC
  Filled 2019-09-05: qty 4

## 2019-09-05 MED ORDER — PROPOFOL 10 MG/ML IV BOLUS
INTRAVENOUS | Status: DC | PRN
  Administered 2019-09-05: 150 mg via INTRAVENOUS

## 2019-09-05 MED ORDER — SODIUM CHLORIDE 0.9 % IV SOLN
INTRAVENOUS | Status: DC | PRN
  Administered 2019-09-05 (×2): 40 ug via INTRAVENOUS
  Administered 2019-09-05: 10:00:00 80 ug via INTRAVENOUS

## 2019-09-05 MED ORDER — FENTANYL CITRATE (PF) 100 MCG/2ML IJ SOLN: 25.0000 ug | INTRAMUSCULAR | Status: DC | PRN

## 2019-09-05 MED ORDER — ONDANSETRON HCL 4 MG/2ML IJ SOLN
4.0000 mg | Freq: Once | INTRAMUSCULAR | Status: AC
  Administered 2019-09-05: 4 mg via INTRAVENOUS

## 2019-09-05 MED ORDER — LACTATED RINGERS IV SOLN: INTRAVENOUS | Status: DC

## 2019-09-05 MED ORDER — PROPOFOL 500 MG/50ML IV EMUL
INTRAVENOUS | Status: DC | PRN
  Administered 2019-09-05: 25 ug/kg/min via INTRAVENOUS

## 2019-09-05 MED ORDER — DEXAMETHASONE SODIUM PHOSPHATE 10 MG/ML IJ SOLN
INTRAMUSCULAR | Status: DC | PRN
  Administered 2019-09-05: 4 mg via INTRAVENOUS

## 2019-09-05 MED ORDER — ACETAMINOPHEN 500 MG PO TABS
1000.0000 mg | ORAL_TABLET | Freq: Once | ORAL | Status: AC
  Administered 2019-09-05: 1000 mg via ORAL

## 2019-09-05 MED ORDER — DEXAMETHASONE SODIUM PHOSPHATE 10 MG/ML IJ SOLN
INTRAMUSCULAR | Status: DC | PRN
  Administered 2019-09-05: 5 mg

## 2019-09-05 MED ORDER — ROPIVACAINE HCL 5 MG/ML IJ SOLN: INTRAMUSCULAR | Status: DC | PRN

## 2019-09-05 MED ORDER — LIDOCAINE HCL (CARDIAC) PF 100 MG/5ML IV SOSY
PREFILLED_SYRINGE | INTRAVENOUS | Status: DC | PRN
  Administered 2019-09-05: 30 mg via INTRAVENOUS

## 2019-09-05 MED ORDER — OXYCODONE HCL 5 MG PO TABS
ORAL_TABLET | ORAL | Status: AC
  Filled 2019-09-05: qty 1

## 2019-09-05 MED ORDER — ASPIRIN EC 81 MG PO TBEC: 81.0000 mg | DELAYED_RELEASE_TABLET | Freq: Two times a day (BID) | ORAL | 0 refills | Status: DC

## 2019-09-05 MED ORDER — FENTANYL CITRATE (PF) 100 MCG/2ML IJ SOLN
50.0000 ug | INTRAMUSCULAR | Status: DC | PRN
  Administered 2019-09-05: 50 ug via INTRAVENOUS

## 2019-09-05 MED ORDER — ONDANSETRON HCL 4 MG/2ML IJ SOLN
INTRAMUSCULAR | Status: AC
  Filled 2019-09-05: qty 2

## 2019-09-05 MED ORDER — EPHEDRINE 5 MG/ML INJ
INTRAVENOUS | Status: AC
  Filled 2019-09-05: qty 20

## 2019-09-05 MED ORDER — 0.9 % SODIUM CHLORIDE (POUR BTL) OPTIME
TOPICAL | Status: DC | PRN
  Administered 2019-09-05: 10:00:00 200 mL

## 2019-09-05 MED ORDER — VANCOMYCIN HCL 500 MG IV SOLR
INTRAVENOUS | Status: DC | PRN
  Administered 2019-09-05: 500 mg via TOPICAL

## 2019-09-05 MED ORDER — OXYCODONE HCL 5 MG PO TABS
5.0000 mg | ORAL_TABLET | Freq: Once | ORAL | Status: AC
Start: 2019-09-05 — End: 2019-09-05
  Administered 2019-09-05: 5 mg via ORAL

## 2019-09-05 MED ORDER — MIDAZOLAM HCL 2 MG/2ML IJ SOLN
INTRAMUSCULAR | Status: AC
  Filled 2019-09-05: qty 2

## 2019-09-05 MED ORDER — SENNA 8.6 MG PO TABS: 2.0000 | ORAL_TABLET | Freq: Two times a day (BID) | ORAL | 0 refills | Status: DC

## 2019-09-05 SURGICAL SUPPLY — 83 items
BANDAGE ESMARK 6X9 LF (GAUZE/BANDAGES/DRESSINGS) IMPLANT
BIT DRILL 2.9 CANN QC NONSTRL (BIT) ×3 IMPLANT
BIT DRILL CAL 2.5 ST W/SLV (BIT) ×3 IMPLANT
BIT TREPHINE CORING 8 (BIT) IMPLANT
BIT TREPHINE CORING 8MM (BIT)
BLADE AVERAGE 25MMX9MM (BLADE) ×1
BLADE AVERAGE 25X9 (BLADE) ×2 IMPLANT
BLADE MICRO SAGITTAL (BLADE) ×3 IMPLANT
BLADE SURG 15 STRL LF DISP TIS (BLADE) ×2 IMPLANT
BLADE SURG 15 STRL SS (BLADE) ×6
BNDG COHESIVE 4X5 TAN STRL (GAUZE/BANDAGES/DRESSINGS) ×3 IMPLANT
BNDG COHESIVE 6X5 TAN STRL LF (GAUZE/BANDAGES/DRESSINGS) ×3 IMPLANT
BNDG ESMARK 6X9 LF (GAUZE/BANDAGES/DRESSINGS)
CHLORAPREP W/TINT 26 (MISCELLANEOUS) ×3 IMPLANT
COVER BACK TABLE 60X90IN (DRAPES) ×3 IMPLANT
COVER MAYO STAND STRL (DRAPES) ×6 IMPLANT
COVER WAND RF STERILE (DRAPES) IMPLANT
CUFF TOURN SGL QUICK 24 (TOURNIQUET CUFF) ×3
CUFF TOURN SGL QUICK 34 (TOURNIQUET CUFF)
CUFF TRNQT CYL 24X4X16.5-23 (TOURNIQUET CUFF) ×1 IMPLANT
CUFF TRNQT CYL 34X4.125X (TOURNIQUET CUFF) IMPLANT
DRAPE EXTREMITY T 121X128X90 (DISPOSABLE) ×3 IMPLANT
DRAPE OEC MINIVIEW 54X84 (DRAPES) ×3 IMPLANT
DRAPE U-SHAPE 47X51 STRL (DRAPES) ×3 IMPLANT
DRSG MEPITEL 4X7.2 (GAUZE/BANDAGES/DRESSINGS) ×3 IMPLANT
DRSG PAD ABDOMINAL 8X10 ST (GAUZE/BANDAGES/DRESSINGS) ×6 IMPLANT
ELECT REM PT RETURN 9FT ADLT (ELECTROSURGICAL) ×3
ELECTRODE REM PT RTRN 9FT ADLT (ELECTROSURGICAL) ×1 IMPLANT
GAUZE SPONGE 4X4 12PLY STRL (GAUZE/BANDAGES/DRESSINGS) ×3 IMPLANT
GLOVE BIO SURGEON STRL SZ8 (GLOVE) ×3 IMPLANT
GLOVE BIOGEL PI IND STRL 6.5 (GLOVE) ×1 IMPLANT
GLOVE BIOGEL PI IND STRL 8 (GLOVE) ×2 IMPLANT
GLOVE BIOGEL PI INDICATOR 6.5 (GLOVE) ×2
GLOVE BIOGEL PI INDICATOR 8 (GLOVE) ×4
GLOVE ECLIPSE 6.5 STRL STRAW (GLOVE) ×6 IMPLANT
GLOVE ECLIPSE 8.0 STRL XLNG CF (GLOVE) ×3 IMPLANT
GOWN STRL REUS W/ TWL LRG LVL3 (GOWN DISPOSABLE) ×2 IMPLANT
GOWN STRL REUS W/ TWL XL LVL3 (GOWN DISPOSABLE) ×2 IMPLANT
GOWN STRL REUS W/TWL LRG LVL3 (GOWN DISPOSABLE) ×6
GOWN STRL REUS W/TWL XL LVL3 (GOWN DISPOSABLE) ×6
K-WIRE ACE 1.6X6 (WIRE) ×6
KIT STRATUM INSTRUMENT STD (KITS) ×3 IMPLANT
KWIRE ACE 1.6X6 (WIRE) ×2 IMPLANT
LOOP VESSEL MINI RED (MISCELLANEOUS) ×3 IMPLANT
NDL SAFETY ECLIPSE 18X1.5 (NEEDLE) IMPLANT
NEEDLE HYPO 18GX1.5 SHARP (NEEDLE)
NEEDLE HYPO 22GX1.5 SAFETY (NEEDLE) IMPLANT
PAD CAST 4YDX4 CTTN HI CHSV (CAST SUPPLIES) ×1 IMPLANT
PADDING CAST COTTON 4X4 STRL (CAST SUPPLIES) ×3
PADDING CAST COTTON 6X4 STRL (CAST SUPPLIES) ×3 IMPLANT
PENCIL SMOKE EVACUATOR (MISCELLANEOUS) ×3 IMPLANT
PLATE SNGL LISFRANC HOLE 3 (Plate) ×3 IMPLANT
PUTTY DBM STAGRAFT PLUS 2CC (Putty) ×3 IMPLANT
SANITIZER HAND PURELL 535ML FO (MISCELLANEOUS) ×3 IMPLANT
SCREW ACE CAN 4.0 30M (Screw) ×3 IMPLANT
SCREW ACE CAN 4.0 34M (Screw) ×3 IMPLANT
SCREW ACE CAN 4.0 38M (Screw) ×3 IMPLANT
SCREW ACE CAN 4.0 42M (Screw) ×3 IMPLANT
SCREW LOW PROFILE 3.5X48MM (Screw) ×3 IMPLANT
SCREW NLOCK CANC HEX 4X32 (Screw) ×3 IMPLANT
SCREW STRM NL LP 3.5X12 (Screw) ×6 IMPLANT
SCREW STRM NL LP 3.5X20 (Screw) ×6 IMPLANT
SET BASIN DAY SURGERY F.S. (CUSTOM PROCEDURE TRAY) ×3 IMPLANT
SHEET MEDIUM DRAPE 40X70 STRL (DRAPES) ×6 IMPLANT
SLEEVE SCD COMPRESS KNEE MED (MISCELLANEOUS) ×3 IMPLANT
SPLINT FAST PLASTER 5X30 (CAST SUPPLIES) ×40
SPLINT PLASTER CAST FAST 5X30 (CAST SUPPLIES) ×20 IMPLANT
SPONGE LAP 18X18 RF (DISPOSABLE) ×3 IMPLANT
SPONGE SURGIFOAM ABS GEL 12-7 (HEMOSTASIS) IMPLANT
STOCKINETTE 6  STRL (DRAPES) ×3
STOCKINETTE 6 STRL (DRAPES) ×1 IMPLANT
SUCTION FRAZIER HANDLE 10FR (MISCELLANEOUS) ×3
SUCTION TUBE FRAZIER 10FR DISP (MISCELLANEOUS) ×1 IMPLANT
SUT ETHILON 3 0 PS 1 (SUTURE) ×3 IMPLANT
SUT MNCRL AB 3-0 PS2 18 (SUTURE) ×3 IMPLANT
SUT VIC AB 0 SH 27 (SUTURE) IMPLANT
SUT VIC AB 2-0 SH 27 (SUTURE) ×6
SUT VIC AB 2-0 SH 27XBRD (SUTURE) ×2 IMPLANT
SYR BULB EAR ULCER 3OZ GRN STR (SYRINGE) ×3 IMPLANT
TOWEL GREEN STERILE FF (TOWEL DISPOSABLE) ×6 IMPLANT
TUBE CONNECTING 20'X1/4 (TUBING) ×1
TUBE CONNECTING 20X1/4 (TUBING) ×2 IMPLANT
UNDERPAD 30X36 HEAVY ABSORB (UNDERPADS AND DIAPERS) ×3 IMPLANT

## 2019-09-05 NOTE — Anesthesia Postprocedure Evaluation (Signed)
Anesthesia Post Note  Patient: RAMONDA GALYON  Procedure(s) Performed: Left Modified McBride, Left First and Second Tarsometatarsal Joint Arthrodesis (Left Foot)     Patient location during evaluation: PACU Anesthesia Type: Regional and General Level of consciousness: awake and alert Pain management: pain level controlled Vital Signs Assessment: post-procedure vital signs reviewed and stable Respiratory status: spontaneous breathing, nonlabored ventilation, respiratory function stable and patient connected to nasal cannula oxygen Cardiovascular status: blood pressure returned to baseline and stable Postop Assessment: no apparent nausea or vomiting Anesthetic complications: no    Last Vitals:  Vitals:   09/05/19 1345 09/05/19 1400  BP: 132/70 124/70  Pulse: 99 (!) 101  Resp:    Temp: 36.9 C   SpO2: 100% 98%    Last Pain:  Vitals:   09/05/19 1345  TempSrc: Oral  PainSc: 3                  Akirah Storck L Favor Kreh

## 2019-09-05 NOTE — Anesthesia Preprocedure Evaluation (Addendum)
Anesthesia Evaluation  Patient identified by MRN, date of birth, ID band Patient awake    Reviewed: Allergy & Precautions, NPO status , Patient's Chart, lab work & pertinent test results  Airway Mallampati: IV  TM Distance: >3 FB Neck ROM: Full  Mouth opening: Limited Mouth Opening  Dental  (+) Chipped, Dental Advisory Given,    Pulmonary asthma ,    Pulmonary exam normal breath sounds clear to auscultation       Cardiovascular hypertension, Pt. on medications Normal cardiovascular exam Rhythm:Regular Rate:Normal  TTE 2007 Normal LVEF, valves ok   Neuro/Psych  Neuromuscular disease (h/o bell's palsy - right facial droop) negative psych ROS   GI/Hepatic Neg liver ROS, hiatal hernia, GERD  ,  Endo/Other  negative endocrine ROSdiabetes, Type 2, Oral Hypoglycemic Agents  Renal/GU negative Renal ROS  negative genitourinary   Musculoskeletal  (+) Arthritis ,   Abdominal   Peds  Hematology negative hematology ROS (+)   Anesthesia Other Findings   Reproductive/Obstetrics                          Anesthesia Physical Anesthesia Plan  ASA: II  Anesthesia Plan: General and Regional   Post-op Pain Management:  Regional for Post-op pain   Induction: Intravenous  PONV Risk Score and Plan: 3 and Ondansetron, Dexamethasone and Midazolam  Airway Management Planned: LMA  Additional Equipment:   Intra-op Plan:   Post-operative Plan: Extubation in OR  Informed Consent: I have reviewed the patients History and Physical, chart, labs and discussed the procedure including the risks, benefits and alternatives for the proposed anesthesia with the patient or authorized representative who has indicated his/her understanding and acceptance.     Dental advisory given  Plan Discussed with: CRNA  Anesthesia Plan Comments:         Anesthesia Quick Evaluation

## 2019-09-05 NOTE — Transfer of Care (Signed)
Immediate Anesthesia Transfer of Care Note  Patient: Dominique [name]  Procedure(s) Performed: Left Modified McBride, Left First and Second Tarsometatarsal Joint Arthrodesis (Left Foot)  Patient Location: PACU  Anesthesia Type:GA combined with regional for post-op pain  Level of Consciousness: drowsy and patient cooperative  Airway & Oxygen Therapy: Patient Spontanous Breathing and Patient connected to face mask oxygen  Post-op Assessment: Report given to RN and Post -op Vital signs reviewed and stable  Post vital signs: Reviewed and stable  Last Vitals:  Vitals Value Taken Time  BP    Temp    Pulse 89 09/05/19 1128  Resp 10 09/05/19 1128  SpO2 98 % 09/05/19 1128  Vitals shown include unvalidated device data.  Last Pain:  Vitals:   09/05/19 0719  TempSrc: Oral  PainSc: 0-No pain      Patients Stated Pain Goal: 4 (09/05/19 0719)  Complications: No apparent anesthesia complications

## 2019-09-05 NOTE — Anesthesia Procedure Notes (Signed)
Procedure Name: LMA Insertion Date/Time: 09/05/2019 9:35 AM Performed by: Sheryn Bison, CRNA Pre-anesthesia Checklist: Patient identified, Emergency Drugs available, Suction available and Patient being monitored Patient Re-evaluated:Patient Re-evaluated prior to induction Oxygen Delivery Method: Circle system utilized Preoxygenation: Pre-oxygenation with 100% oxygen Induction Type: IV induction Ventilation: Mask ventilation without difficulty LMA: LMA inserted LMA Size: 4.0 Number of attempts: 1 Airway Equipment and Method: Bite block Placement Confirmation: positive ETCO2 Tube secured with: Tape Dental Injury: Teeth and Oropharynx as per pre-operative assessment

## 2019-09-05 NOTE — Anesthesia Procedure Notes (Signed)
Anesthesia Regional Block: Popliteal block   Pre-Anesthetic Checklist: ,, timeout performed, Correct Patient, Correct Site, Correct Laterality, Correct Procedure, Correct Position, site marked, Risks and benefits discussed,  Surgical consent,  Pre-op evaluation,  At surgeon's request and post-op pain management  Laterality: Left  Prep: Maximum Sterile Barrier Precautions used, chloraprep       Needles:  Injection technique: Single-shot  Needle Type: Echogenic Stimulator Needle     Needle Length: 9cm  Needle Gauge: 22     Additional Needles:   Procedures:,,,, ultrasound used (permanent image in chart),,,,  Narrative:  Start time: 09/05/2019 8:00 AM End time: 09/05/2019 8:10 AM Injection made incrementally with aspirations every 5 mL.  Performed by: Personally  Anesthesiologist: Elmer Picker, MD  Additional Notes: Monitors applied. No increased pain on injection. No increased resistance to injection. Injection made in 5cc increments. Good needle visualization. Patient tolerated procedure well.

## 2019-09-05 NOTE — Progress Notes (Signed)
Assisted Dr. Woodrum with left, ultrasound guided, popliteal block. Side rails up, monitors on throughout procedure. See vital signs in flow sheet. Tolerated Procedure well. 

## 2019-09-05 NOTE — H&P (Signed)
Dominique [name] is an 59 y.o. female.   Chief Complaint:  Left foot pain HPI:  The patient is a 59 y/o female without PMH.  She c/o a long h/o L foot pain from midfoot arthritis and a prominent bunion deformity.  She has failed non o ptreatment and presents today for surgery.  Past Medical History:  Diagnosis Date  . Arthritis    left foot  . Asthma   . Bell palsy 2015  . Diabetes mellitus without complication ([place])    type 2  . Endometriosis   . GERD (gastroesophageal reflux disease)   . H/O hiatal hernia   . 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 Hypertension   . Kidney stone   . Kidney stones     Past Surgical History:  Procedure Laterality Date  . laparatomy  1985  . RADIAL HEAD ARTHROPLASTY Left 02/23/2013   Procedure: LEFT RADIAL HEAD ARTHROPLASTY qwith ligament reconstruction;  Surgeon: Roseanne Kaufman, MD;  Location: [facility];  Service: Orthopedics;  Laterality: Left;  . TONSILLECTOMY AND ADENOIDECTOMY  1967  . uterine lining ablation  2002    Family History  Problem Relation Age of Onset  . Diabetes Mother   . Hyperlipidemia Mother   . Hypertension Mother   . Diabetes Father   . Hyperlipidemia Father   . Hypertension Father    Social History:  reports that she has never smoked. She has never used smokeless tobacco. She reports that she does not drink alcohol or use drugs.  Allergies: No Known Allergies  Medications Prior to Admission  Medication Sig Dispense Refill  . atorvastatin (LIPITOR) 10 MG tablet Take 1 tablet (10 mg total) by mouth every other day. 45 tablet 1  . fenofibrate 160 MG tablet TAKE 1 TABLET(160 MG) BY MOUTH DAILY. 90 tablet 1  . fluticasone (FLONASE) 50 MCG/ACT nasal spray SHAKE LIQUID AND USE 2 SPRAYS IN EACH NOSTRIL DAILY 48 g 0  . gabapentin (NEURONTIN) 100 MG capsule Take 1 capsule (100 mg total) by mouth 2 (two) times daily. Must be seen prior to future refills. 180 capsule 3  . glipiZIDE (GLUCOTROL XL) 5 MG 24 hr tablet  TAKE 1 TABLET(5 MG) BY MOUTH DAILY WITH BREAKFAST 90 tablet 3  . losartan (COZAAR) 100 MG tablet TAKE 1 TABLET(100 MG) BY MOUTH DAILY. 90 tablet 1  . metFORMIN (GLUCOPHAGE-XR) 500 MG 24 hr tablet Take 1000 mg with dinner 180 tablet 3  . montelukast (SINGULAIR) 10 MG tablet TAKE 1 TABLET(10 MG) BY MOUTH AT BEDTIME. 90 tablet 1  . Semaglutide, 1 MG/DOSE, (OZEMPIC, 1 MG/DOSE,) 2 MG/1.5ML SOPN Inject 1 mg into the skin once a week. 12 pen 3  . albuterol (PROAIR HFA) 108 (90 Base) MCG/ACT inhaler Inhale 2 puffs into the lungs every 6 (six) hours as needed for wheezing or shortness of breath. 18 g 3  . blood glucose meter kit and supplies Dispense based on patient and insurance preference. Use up to four times daily as directed. (FOR ICD-10 E10.9, E11.9). 1 each 0  . Blood Glucose Monitoring Suppl (CONTOUR NEXT ONE) KIT 1 kit by Does not apply route See admin instructions. For checking blood sugar 2 times a day 1 kit 0  . fluticasone (FLOVENT HFA) 110 MCG/ACT inhaler Inhale 2 puffs into the lungs 2 (two) times daily. 36 g 0  . glucose blood (CONTOUR NEXT TEST) test strip Use to check blood sugar 2 times a day. 200 each 12  . levalbuterol (  XOPENEX) 1.25 MG/3ML nebulizer solution USE ONE VIAL BY NEBULIZATION EVERY 6 HOURS AS NEEDED FOR WHEEZING OR SHORTNESS OF BREATH 675 mL 0    Results for orders placed or performed during the hospital encounter of 09-25-19 (from the past 48 hour(s))  Glucose, capillary     Status: Abnormal   Collection Time: 09/25/19  7:20 AM  Result Value Ref Range   Glucose-Capillary 123 (H) 70 - 99 mg/dL    Comment: Glucose reference range applies only to samples taken after fasting for at least 8 hours.   No results found.  Review of Systems  No recent f/c/n/v/wt loss.  Blood pressure 115/67, pulse 85, temperature 97.8 F (36.6 C), temperature source Oral, resp. rate 16, height 5' 3" (1.6 m), weight 68.4 kg, SpO2 99 %. Physical Exam  wn wd woman in nad.  A and O x 4.  Mood  and affect are normal.  EOMI.  resp unlaobred. L foot with healthy skin and normal NV exam.  Prominent bunion deformity.  Skin healthy.  TTP over the 1st and 2nd TMT joints.  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  The risks and benefits of the alternative treatment options have been discussed in detail.  The patient wishes to proceed with surgery and specifically understands risks of bleeding, infection, nerve damage, blood clots, need for additional surgery, amputation and death.   Wylene Simmer, MD 25-Sep-2019, 9:15 AM

## 2019-09-05 NOTE — Discharge Instructions (Addendum)
Post Anesthesia Home Care Instructions  Activity: Get plenty of rest for the remainder of the day. A responsible individual must stay with you for 24 hours following the procedure.  For the next 24 hours, DO NOT: -Drive a car -Advertising copywriter -Drink alcoholic beverages -Take any medication unless instructed by your physician -Make any legal decisions or sign important papers.  Meals: Start with liquid foods such as gelatin or soup. Progress to regular foods as tolerated. Avoid greasy, spicy, heavy foods. If nausea and/or vomiting occur, drink only clear liquids until the nausea and/or vomiting subsides. Call your physician if vomiting continues.  Special Instructions/Symptoms: Your throat may feel dry or sore from the anesthesia or the breathing tube placed in your throat during surgery. If this causes discomfort, gargle with warm salt water. The discomfort should disappear within 24 hours.  If you had a scopolamine patch placed behind your ear for the management of post- operative nausea and/or vomiting:  1. The medication in the patch is effective for 72 hours, after which it should be removed.  Wrap patch in a tissue and discard in the trash. Wash hands thoroughly with soap and water. 2. You may remove the patch earlier than 72 hours if you experience unpleasant side effects which may include dry mouth, dizziness or visual disturbances. 3. Avoid touching the patch. Wash your hands with soap and water after contact with the patch.    NO TYLENOL PRODUCTS UNTIL 2:00PM  OXYCODONE 5MG  GIVEN AT 1:20 PM     Regional Anesthesia Blocks  1. Numbness or the inability to move the "blocked" extremity may last from 3-48 hours after placement. The length of time depends on the medication injected and your individual response to the medication. If the numbness is not going away after 48 hours, call your surgeon.  2. The extremity that is blocked will need to be protected until the numbness  is gone and the  Strength has returned. Because you cannot feel it, you will need to take extra care to avoid injury. Because it may be weak, you may have difficulty moving it or using it. You may not know what position it is in without looking at it while the block is in effect.  3. For blocks in the legs and feet, returning to weight bearing and walking needs to be done carefully. You will need to wait until the numbness is entirely gone and the strength has returned. You should be able to move your leg and foot normally before you try and bear weight or walk. You will need someone to be with you when you first try to ensure you do not fall and possibly risk injury.  4. Bruising and tenderness at the needle site are common side effects and will resolve in a few days.  5. Persistent numbness or new problems with movement should be communicated to the surgeon or the [facility] [phone] [facility] [phone]).   Information for Discharge Teaching: EXPAREL (bupivacaine liposome injectable suspension)   Your surgeon or anesthesiologist gave you EXPAREL(bupivacaine) to help control your pain after surgery.   EXPAREL is a local anesthetic that provides pain relief by numbing the tissue around the surgical site.  EXPAREL is designed to release pain medication over time and can control pain for up to 72 hours.  Depending on how you respond to EXPAREL, you may require less pain medication during your recovery.  Possible side effects:  Temporary loss of sensation or ability  to move in the area where bupivacaine was injected.  Nausea, vomiting, constipation  Rarely, numbness and tingling in your mouth or lips, lightheadedness, or anxiety may occur.  Call your doctor right away if you think you may be experiencing any of these sensations, or if you have other questions regarding possible side effects.  Follow all other discharge instructions given to you by  your surgeon or nurse. Eat a healthy diet and drink plenty of water or other fluids.  If you return to the hospital for any reason within 96 hours following the administration of EXPAREL, it is important for health care providers to know that you have received this anesthetic. A teal colored band has been placed on your arm with the date, time and amount of EXPAREL you have received in order to alert and inform your health care providers. Please leave this armband in place for the full 96 hours following administration, and then you may remove the band.    Wylene Simmer, MD [facility]  Please read the following information regarding your care after surgery.  Medications  You only need a prescription for the narcotic pain medicine (ex. oxycodone, Percocet, Norco).  All of the other medicines listed below are available over the counter. X Aleve 2 pills twice a day for the first 3 days after surgery. X acetominophen (Tylenol) 650 mg every 4-6 hours as you need for minor to moderate pain X oxycodone as prescribed for severe pain  Narcotic pain medicine (ex. oxycodone, Percocet, Vicodin) will cause constipation.  To prevent this problem, take the following medicines while you are taking any pain medicine. X docusate sodium (Colace) 100 mg twice a day X senna (Senokot) 2 tablets twice a day  X To help prevent blood clots, take a baby aspirin (81 mg) twice a day for six weeks after surgery.  You should also get up every hour while you are awake to move around.    Weight Bearing X Do not bear any weight on the operated leg or foot.  Cast / Splint / Dressing X Keep your splint, cast or dressing clean and dry.  Don't put anything (coat hanger, pencil, etc) down inside of it.  If it gets damp, use a hair dryer on the cool setting to dry it.  If it gets soaked, call the office to schedule an appointment for a cast change.   After your dressing, cast or splint is removed; you may shower, but do not  soak or scrub the wound.  Allow the water to run over it, and then gently pat it dry.  Swelling It is normal for you to have swelling where you had surgery.  To reduce swelling and pain, keep your toes above your nose for at least 3 days after surgery.  It may be necessary to keep your foot or leg elevated for several weeks.  If it hurts, it should be elevated.  Follow Up Call my office at [phone] when you are discharged from the hospital or surgery center to schedule an appointment to be seen two weeks after surgery.  Call my office at [phone] if you develop a fever >101.5 F, nausea, vomiting, bleeding from the surgical site or severe pain.

## 2019-09-05 NOTE — Op Note (Signed)
09/05/2019  11:28 AM  PATIENT:  Dominique [name]  59 y.o. female  PRE-OPERATIVE DIAGNOSIS: 1.  Left foot bunion deformity 2.  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  Left modified McBride bunionectomy 2.  Left first TMT joint Lapidus arthrodesis 3.  Left second tarsometatarsal joint and medial intercuneiform joint arthrodesis 4.  Left foot AP, lateral and oblique radiographs  SURGEON:  Wylene Simmer, MD  ASSISTANT: Mechele Claude, PA-C  ANESTHESIA:   General, regional  EBL:  minimal   TOURNIQUET:   Total Tourniquet Time Documented: Thigh (Left) - 86 minutes Total: Thigh (Left) - 86 minutes  COMPLICATIONS:  None apparent  DISPOSITION:  Extubated, awake and stable to recovery.  INDICATION FOR PROCEDURE: The patient is a 59 year old female with a painful left foot bunion deformity.  She also has dorsal midfoot pain related to second TMT joint arthritis.  She has failed nonoperative treatment to date including activity modification, oral anti-inflammatories, shoewear modification and toe spacers.  She presents now for surgical treatment of these painful left foot conditions.  The risks and benefits of the alternative treatment options have been discussed in detail.  The patient wishes to proceed with surgery and specifically understands risks of bleeding, infection, nerve damage, blood clots, need for additional surgery, amputation and death.  PROCEDURE IN DETAIL: After preoperative consent was obtained and the correct operative site was identified, the patient was brought to the operating room and placed supine on the operating table.  Preoperative antibiotics were administered.  A surgical timeout was taken.  General anesthesia was administered.  The left lower extremity was prepped and draped in standard sterile fashion with a tourniquet around the thigh.  The extremity was elevated and the tourniquet was inflated to  250 mmHg.  An incision was then made over the dorsal first webspace.  Dissection was carried down to the subcutaneous tissues.  The intermetatarsal ligament was divided under direct vision.  An arthrotomy was then made in the lateral joint capsule between the sesamoid and the metatarsal head.  Small perforations were made in the lateral joint capsule.  The hallux could then be positioned in 20 degrees of varus passively.  Attention was turned to the medial eminence where a longitudinal incision was made.  Dissection was carried down through the subcutaneous tissues.  The medial joint capsule was incised and elevated dorsally and plantarly.  The hypertrophic medial eminence was resected in line with the first metatarsal shaft with an oscillating saw.  Attention was then turned to the dorsum of the midfoot where a longitudinal incision was made.  Dissection was carried down through the subcutaneous tissues.  The interval between the extensor houses longus and brevis tendons was developed and the neurovascular bundle was identified.  It was mobilized and retracted with a vessel loop.  The first TMT joint was opened after incising the joint capsule.  The oscillating saw was used to resect the distal articular surface of the medial cuneiform.  A beveled cut was then made at the base of the first metatarsal taking more bone laterally than medially in order to correct the intermetatarsal angle.  The joint was reduced and provisionally pinned.  A radiograph confirmed appropriate correction of the intermetatarsal angle.  A 4 mm partially-threaded cannulated screw from the [employer] titanium small frag set was inserted.  It compressed the joint line appropriately.  A K wire was then inserted from distal to proximal across the medial half  of the joint.  Radiographs confirmed appropriate alignment of the wire.  The wire was overdrilled and a 3.5 mm fully threaded LPS screw was inserted.  The oscillating saw was then  used to resect the distal articular surface of the middle cuneiform followed by the base of the second metatarsal.  This joint was noted to be completely arthritic with dorsal osteophytes and complete loss of the articular cartilage.  The wound was irrigated copiously.  A drill bit was used to perforate the bone on both sides of the joint leaving the resultant bone graft in place.  A K wire was then inserted from the medial cuneiform into the second of the dorsal base.  A 4 mm partially-threaded cannulated screw was inserted compressing the joint appropriately.  A K wire was then inserted from the medial cuneiform across in the homerun position to the second metatarsal base.  The guidepin was overdrilled and a 4 mm partially-threaded cannulated screw was inserted.  This compressed the second metatarsal base into the medial cuneiform.  A 3 hole [employer] stratum plate was selected.  This was the Lisfranc plate.  It was applied dorsally after predrilling and impacted into position.  The compression slot was then drilled and used to compress the joint.  Locking and nonlocking bicortical screws were inserted distally.  A nonlocking screw was inserted proximally.  The compression pin was removed and replaced with a nonlocking screw.  The homerun screw lost purchase and was removed.  A second homerun screw position was pinned.  Radiographs confirmed appropriate alignment.  The Lisfranc joint was compressed with a tenaculum.  The guidepin was overdrilled and a 4 mm partially-threaded cannulated screw was inserted.  Was noted to have excellent purchase and compressed the Lisfranc joint appropriately.  Final AP, lateral and oblique radiographs confirmed appropriate position and length of hardware and appropriate arthrodesis of the first and second TMT joints.  Bunion correction was noted as well.  Wounds were irrigated copiously.  Bone graft was packed into the Lisfranc joint as well as the first and second TMT joints.   Vancomycin powder was sprinkled in the wounds.  The medial joint capsule was repaired with imbricating sutures of 2-0 Vicryl.  Subcutaneous tissues were closed with Monocryl and skin incisions were closed with nylon.  Sterile dressings were applied followed by a well-padded short leg splint.  The tourniquet was released after application of dressings.  The patient was awakened from anesthesia and transported to the recovery room in stable condition.   FOLLOW UP PLAN: Nonweightbearing on the left lower extremity.  Follow-up in the office in 2 weeks for suture removal and conversion to a short leg cast.  Aspirin for DVT prophylaxis.   RADIOGRAPHS: AP, lateral and oblique radiographs of the left foot are obtained intraoperatively.  These show interval correction of the bunion deformity as well as arthrodesis of the first and second TMT joints.  Hardware is appropriately positioned and of the appropriate lengths.    Alfredo Martinez PA-C was present and scrubbed for the duration of the operative case. His assistance was essential in positioning the patient, prepping and draping, gaining and maintaining exposure, performing the operation, closing and dressing the wounds and applying the splint.

## 2019-09-09 ENCOUNTER — Encounter: Payer: Self-pay | Admitting: *Deleted

## 2019-09-10 ENCOUNTER — Encounter: Payer: Self-pay | Admitting: Internal Medicine

## 2019-09-10 ENCOUNTER — Other Ambulatory Visit: Payer: Self-pay | Admitting: *Deleted

## 2019-09-10 MED ORDER — FLUTICASONE PROPIONATE 50 MCG/ACT NA SUSP: NASAL | 0 refills | Status: DC

## 2019-09-11 MED ORDER — ONETOUCH ULTRASOFT LANCETS MISC: 12 refills | Status: DC

## 2019-09-11 MED ORDER — CONTOUR NEXT TEST VI STRP: ORAL_STRIP | 12 refills | Status: DC

## 2019-09-17 ENCOUNTER — Telehealth: Payer: Self-pay | Admitting: Internal Medicine

## 2019-09-17 MED ORDER — OZEMPIC (1 MG/DOSE) 2 MG/1.5ML ~~LOC~~ SOPN: 1.0000 mg | PEN_INJECTOR | SUBCUTANEOUS | 3 refills | Status: DC

## 2019-09-17 NOTE — Telephone Encounter (Signed)
Pharmacy called stating the manufacturer changed some things with ozempic and just needed the refill re sent due to this change. Said it did not have to be changed or different - they just need it resent.  [id number] [place], [place] - [address] AT [facility] [address] Phone:  [phone]  Fax:  [phone]

## 2019-09-17 NOTE — Telephone Encounter (Signed)
RX sent

## 2019-10-09 ENCOUNTER — Encounter: Payer: Self-pay | Admitting: Family Medicine

## 2019-10-09 DIAGNOSIS — J45909 Unspecified asthma, uncomplicated: Secondary | ICD-10-CM

## 2019-10-09 NOTE — Telephone Encounter (Signed)
Please advise 

## 2019-10-10 NOTE — Telephone Encounter (Signed)
We can send request to [facility] for neb machine

## 2019-10-30 ENCOUNTER — Other Ambulatory Visit: Payer: Self-pay

## 2019-10-30 DIAGNOSIS — J45909 Unspecified asthma, uncomplicated: Secondary | ICD-10-CM

## 2019-10-30 MED ORDER — LEVALBUTEROL HCL 1.25 MG/3ML IN NEBU: INHALATION_SOLUTION | RESPIRATORY_TRACT | 1 refills | Status: DC

## 2019-11-02 ENCOUNTER — Encounter: Payer: Self-pay | Admitting: Family Medicine

## 2019-11-08 ENCOUNTER — Ambulatory Visit (INDEPENDENT_AMBULATORY_CARE_PROVIDER_SITE_OTHER): Payer: 59 | Admitting: Family Medicine

## 2019-11-08 ENCOUNTER — Encounter: Payer: Self-pay | Admitting: Family Medicine

## 2019-11-08 ENCOUNTER — Other Ambulatory Visit: Payer: Self-pay

## 2019-11-08 VITALS — BP 139/68 | HR 97 | Temp 97.5°F | Resp 16 | Ht 63.5 in | Wt 147.0 lb

## 2019-11-08 DIAGNOSIS — L309 Dermatitis, unspecified: Secondary | ICD-10-CM

## 2019-11-08 DIAGNOSIS — K219 Gastro-esophageal reflux disease without esophagitis: Secondary | ICD-10-CM | POA: Diagnosis not present

## 2019-11-08 DIAGNOSIS — J4520 Mild intermittent asthma, uncomplicated: Secondary | ICD-10-CM

## 2019-11-08 MED ORDER — OMEPRAZOLE 40 MG PO CPDR: 40.0000 mg | DELAYED_RELEASE_CAPSULE | Freq: Every day | ORAL | 3 refills | Status: DC

## 2019-11-08 MED ORDER — TRIAMCINOLONE ACETONIDE 0.1 % EX CREA: 1.0000 "application " | TOPICAL_CREAM | Freq: Two times a day (BID) | CUTANEOUS | 0 refills | Status: DC

## 2019-11-08 NOTE — Progress Notes (Signed)
Patient ID: Dominique [name], female    DOB: Feb 24, 1961  Age: 59 y.o. MRN: [id number]    Subjective:  Subjective  HPI Dominique [name] presents for a few things --- 1. rash on R low leg that is itchy and dry  2. gerd that has come back  3. Asthma -- stable -- she just needs a new nebulizer   Review of Systems  Constitutional: Negative for appetite change, diaphoresis, fatigue and unexpected weight change.  Eyes: Negative for pain, redness and visual disturbance.  Respiratory: Negative for cough, chest tightness, shortness of breath and wheezing.   Cardiovascular: Negative for chest pain, palpitations and leg swelling.  Gastrointestinal: Negative for abdominal distention, abdominal pain, nausea, rectal pain and vomiting.  Endocrine: Negative for cold intolerance, heat intolerance, polydipsia, polyphagia and polyuria.  Genitourinary: Negative for difficulty urinating, dysuria and frequency.  Skin: Positive for rash.  Neurological: Negative for dizziness, light-headedness, numbness and headaches.    History Past Medical History:  Diagnosis Date  . Arthritis    left foot  . Asthma   . Bell palsy 2015  . Diabetes mellitus without complication ([place])    type 2  . Endometriosis   . GERD (gastroesophageal reflux disease)   . H/O hiatal hernia   . 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 Hypertension   . Kidney stone   . Kidney stones     She has a past surgical history that includes Tonsillectomy and adenoidectomy (1967); uterine lining ablation (2002); laparatomy (1985); Radial head arthroplasty (Left, 02/23/2013); and Foot arthrodesis (Left, 09/05/2019).   Her family history includes Diabetes in her father and mother; Hyperlipidemia in her father and mother; Hypertension in her father and mother.She reports that she has never smoked. She has never used smokeless tobacco. She reports that she does not drink alcohol and does not use drugs.  Current Outpatient  Medications on File Prior to Visit  Medication Sig Dispense Refill  . albuterol (PROAIR HFA) 108 (90 Base) MCG/ACT inhaler Inhale 2 puffs into the lungs every 6 (six) hours as needed for wheezing or shortness of breath. 18 g 3  . atorvastatin (LIPITOR) 10 MG tablet Take 1 tablet (10 mg total) by mouth every other day. 45 tablet 1  . blood glucose meter kit and supplies Dispense based on patient and insurance preference. Use up to four times daily as directed. (FOR ICD-10 E10.9, E11.9). 1 each 0  . Blood Glucose Monitoring Suppl (CONTOUR NEXT ONE) KIT 1 kit by Does not apply route See admin instructions. For checking blood sugar 2 times a day 1 kit 0  . fenofibrate 160 MG tablet TAKE 1 TABLET(160 MG) BY MOUTH DAILY. 90 tablet 1  . fluticasone (FLONASE) 50 MCG/ACT nasal spray SHAKE LIQUID AND USE 2 SPRAYS IN EACH NOSTRIL DAILY 48 g 0  . fluticasone (FLOVENT HFA) 110 MCG/ACT inhaler Inhale 2 puffs into the lungs 2 (two) times daily. 36 g 0  . gabapentin (NEURONTIN) 100 MG capsule Take 1 capsule (100 mg total) by mouth 2 (two) times daily. Must be seen prior to future refills. 180 capsule 3  . glipiZIDE (GLUCOTROL XL) 5 MG 24 hr tablet TAKE 1 TABLET(5 MG) BY MOUTH DAILY WITH BREAKFAST 90 tablet 3  . glucose blood (CONTOUR NEXT TEST) test strip Use to check blood sugar 2 times a day. 200 each 12  . levalbuterol (XOPENEX) 1.25 MG/3ML nebulizer solution USE ONE VIAL BY NEBULIZATION EVERY 6 HOURS AS NEEDED FOR WHEEZING OR  SHORTNESS OF BREATH 675 mL 1  . losartan (COZAAR) 100 MG tablet TAKE 1 TABLET(100 MG) BY MOUTH DAILY. 90 tablet 1  . metFORMIN (GLUCOPHAGE-XR) 500 MG 24 hr tablet Take 1000 mg with dinner 180 tablet 3  . montelukast (SINGULAIR) 10 MG tablet TAKE 1 TABLET(10 MG) BY MOUTH AT BEDTIME. 90 tablet 1  . Semaglutide, 1 MG/DOSE, (OZEMPIC, 1 MG/DOSE,) 2 MG/1.5ML SOPN Inject 1 mg into the skin once a week. 12 pen 3   No current facility-administered medications on file prior to visit.      Objective:  Objective  Physical Exam Vitals and nursing note reviewed.  Constitutional:      Appearance: She is well-developed.  HENT:     Head: Normocephalic and atraumatic.  Eyes:     Conjunctiva/sclera: Conjunctivae normal.  Neck:     Thyroid: No thyromegaly.     Vascular: No carotid bruit or JVD.  Cardiovascular:     Rate and Rhythm: Normal rate and regular rhythm.     Heart sounds: Normal heart sounds. No murmur heard.   Pulmonary:     Effort: Pulmonary effort is normal. No respiratory distress.     Breath sounds: Normal breath sounds. No wheezing or rales.  Chest:     Chest wall: No tenderness.  Abdominal:     General: There is no distension.     Palpations: There is no mass.     Tenderness: There is no abdominal tenderness. There is no guarding or rebound.     Hernia: No hernia is present.  Musculoskeletal:     Cervical back: Normal range of motion and neck supple.  Skin:    Findings: Lesion and rash present.       Neurological:     Mental Status: She is alert and oriented to person, place, and time.    BP 139/68 (BP Location: Right Arm, Patient Position: Sitting, Cuff Size: Small)   Pulse 97   Temp (!) 97.5 F (36.4 C) (Temporal)   Resp 16   Ht 5' 3.5" (1.613 m)   Wt 147 lb (66.7 kg)   SpO2 100%   BMI 25.63 kg/m  Wt Readings from Last 3 Encounters:  11/08/19 147 lb (66.7 kg)  09/05/19 150 lb 12.7 oz (68.4 kg)  08/09/19 152 lb 6.4 oz (69.1 kg)     Lab Results  Component Value Date   WBC 4.9 03/24/2014   HGB 16.5 (A) 03/24/2014   HCT 48 (A) 03/24/2014   PLT 166 03/24/2014   GLUCOSE 89 08/09/2019   CHOL 136 08/09/2019   TRIG 128 08/09/2019   HDL 42 08/09/2019   LDLCALC 71 08/09/2019   ALT 46 (H) 08/09/2019   AST 31 08/09/2019   NA 143 08/09/2019   K 4.2 08/09/2019   CL 107 (H) 08/09/2019   CREATININE 0.79 08/09/2019   BUN 13 08/09/2019   CO2 21 08/09/2019   TSH 1.56 03/24/2014   HGBA1C 6.3 (A) 06/19/2019    No results found.    Assessment & Plan:  Plan  I have discontinued Mardene Celeste A. Sundeen "Trisha"'s senna, docusate sodium, aspirin EC, and onetouch ultrasoft. I am also having her start on omeprazole and triamcinolone cream. Additionally, I am having her maintain her metFORMIN, glipiZIDE, gabapentin, blood glucose meter kit and supplies, Flovent HFA, fenofibrate, atorvastatin, losartan, montelukast, Contour Next One, albuterol, fluticasone, Contour Next Test, Ozempic (1 MG/DOSE), and levalbuterol.  Meds ordered this encounter  Medications  . omeprazole (PRILOSEC) 40 MG capsule  Sig: Take 1 capsule (40 mg total) by mouth daily.    Dispense:  30 capsule    Refill:  3  . triamcinolone cream (KENALOG) 0.1 %    Sig: Apply 1 application topically 2 (two) times daily.    Dispense:  30 g    Refill:  0    Problem List Items Addressed This Visit      Unprioritized   Dermatitis    Triamcinolone mixed with eucerin bid  rto or call if no improvement       Relevant Medications   triamcinolone cream (KENALOG) 0.1 %   GERD - Primary    Omeprazole daily  gerd diet  If no improvement ---   Refer to GI--- pt does not want to go to GI now       Relevant Medications   omeprazole (PRILOSEC) 40 MG capsule   Mild intermittent asthma without complication    Stable rx for neb sent to [facility]         Follow-up: Return in about 3 months (around 02/08/2020) for hyperlipidemia.  Ann Held, DO

## 2019-11-08 NOTE — Assessment & Plan Note (Signed)
Stable rx for neb sent to [facility]

## 2019-11-08 NOTE — Patient Instructions (Signed)

## 2019-11-08 NOTE — Assessment & Plan Note (Signed)
Triamcinolone mixed with eucerin bid  rto or call if no improvement

## 2019-11-08 NOTE — Assessment & Plan Note (Signed)
Omeprazole daily  gerd diet  If no improvement ---   Refer to GI--- pt does not want to go to GI now

## 2019-11-22 ENCOUNTER — Other Ambulatory Visit: Payer: Self-pay | Admitting: Internal Medicine

## 2019-11-22 ENCOUNTER — Encounter: Payer: Self-pay | Admitting: Internal Medicine

## 2019-11-22 DIAGNOSIS — E1165 Type 2 diabetes mellitus with hyperglycemia: Secondary | ICD-10-CM

## 2019-11-22 NOTE — Telephone Encounter (Signed)
Patient requests Lab Order be printed out for pick up and that patient be called at ph# [phone] to let her know that it is ready. Patient will have her husband or daughter pick up the Order so that patient can be taken to [facility]. Patient requests the above be done asap due to patient leaving town early on 11/28/19.

## 2019-11-25 ENCOUNTER — Other Ambulatory Visit: Payer: Self-pay

## 2019-11-25 ENCOUNTER — Other Ambulatory Visit: Payer: 59

## 2019-11-25 DIAGNOSIS — E1165 Type 2 diabetes mellitus with hyperglycemia: Secondary | ICD-10-CM

## 2019-11-25 NOTE — Telephone Encounter (Signed)
Patient had lab visit today.

## 2019-11-26 LAB — HEMOGLOBIN A1C
Est. average glucose Bld gHb Est-mCnc: 126 mg/dL
Hgb A1c MFr Bld: 6 % — ABNORMAL HIGH (ref 4.8–5.6)

## 2019-12-05 ENCOUNTER — Other Ambulatory Visit: Payer: Self-pay

## 2019-12-05 ENCOUNTER — Encounter: Payer: Self-pay | Admitting: Internal Medicine

## 2019-12-05 ENCOUNTER — Telehealth (INDEPENDENT_AMBULATORY_CARE_PROVIDER_SITE_OTHER): Payer: 59 | Admitting: Internal Medicine

## 2019-12-05 DIAGNOSIS — E1165 Type 2 diabetes mellitus with hyperglycemia: Secondary | ICD-10-CM

## 2019-12-05 DIAGNOSIS — E785 Hyperlipidemia, unspecified: Secondary | ICD-10-CM | POA: Diagnosis not present

## 2019-12-05 NOTE — Patient Instructions (Signed)
Please continue: - Metformin ER 1000 mg with dinner - Glipizide XL 5 mg before b'fast - Ozempic 1 mg weekly  Please come back for a follow-up appointment in 6 months.

## 2019-12-05 NOTE — Progress Notes (Signed)
Patient ID: [name], female   DOB: [birth year], 59 y.o.   MRN: [id number]   Patient location: Home My location: Office Persons participating in the virtual visit: patient, provider  Referring Provider: Ann Held, DO  I connected with the patient on 12/05/19 at  9:42 AM EDT by a video enabled telemedicine application and verified that I am speaking with the correct person.   I discussed the limitations of evaluation and management by telemedicine and the availability of in person appointments. The patient expressed understanding and agreed to proceed.   Details of the encounter are shown below.  HPI: Dominique [name] is a 59 y.o.-year-old female, returning for follow-up for DM2, dx in 2016, but GDM in 2000, non-insulin-dependent, uncontrolled, without long term complications.  She previously saw Dr. Loanne Drilling.  Last visit with me 5.5 months ago.  She had Left foot bunion sx and will have the R foot sx in 02/2020.  Reviewed HbA1c levels: Lab Results  Component Value Date   HGBA1C 6.0 (H) 11/25/2019   HGBA1C 6.3 (A) 06/19/2019   HGBA1C 7.1 (A) 02/12/2019   HGBA1C 6.6 (A) 10/12/2018   HGBA1C 5.9 (A) 06/13/2018   HGBA1C 6.5 (A) 02/22/2018   HGBA1C 7.5 06/20/2017   HGBA1C 6.4 (A) 12/09/2014   HGBA1C 11.9 (A) 03/24/2014  HbA1c 8.9%  She is on: - Metformin ER 1000 mg with dinner  - added 01/2019 - Glipizide XL 5 mg before breakfast - added 07/2018 - Ozempic 0.5 >> 1 mg weekly We have to stop Jardiance 07/2018 due to persistent yeast infections. She stopped metformin due to lack of effect. She developed a rash with Trulicity She was previously on nateglinide and also on repaglinide.  Pt checks her sugars once a day: - am:  160-177, 181 >> 115-144, 154, 159 >> 116, 125-130, 160 - 2h after b'fast: 78, 152 >> n/c >> 135 >> n/c >> 198 >> n/c - before lunch: 97 >> 105 >> n/c - 2h after lunch: 102-132 >> n/c >> 99, 161 >> n/c >> 170 >> n/c - before dinner: 147 >>  n/c - 2h after dinner: 132-175, 199 >> n/c >> 211 >> n/c - bedtime: n/c - nighttime: n/c Lowest sugar was 78 ... 160 >> 112 >> 116; she has hypoglycemia awareness in the 90s. Highest sugar was 211 >> 200 >> 160.  Glucometer: Accu-Chek guide  Pt's meals are: - Breakfast: 1-2 egg, 1-2 toast; egg + bacon - Lunch: eating out, salad, meat + veggies - Dinner: eating out, salad, meat + veggies - Dessert: apple pie, cake - Snacks: chips  -No CKD, last BUN/creatinine:  Lab Results  Component Value Date   BUN 13 08/09/2019   BUN 13 02/14/2019   CREATININE 0.79 08/09/2019   CREATININE 0.90 02/14/2019  On losartan.  -+ HL; last set of lipids: Lab Results  Component Value Date   CHOL 136 08/09/2019   HDL 42 08/09/2019   LDLCALC 71 08/09/2019   TRIG 128 08/09/2019   CHOLHDL 3.2 08/09/2019  On Lipitor 10, fenofibrate 160  - last eye exam was in 07/2019: No DR reportedly; Dr. Sabra Heck.  - no numbness and tingling in her feet  On Gabapentin for Bell's palsy. He had a kidney stone in 2019.  Pt has FH of DM in mother and father.  ROS: Constitutional: no weight gain/no weight loss, no fatigue, no subjective hyperthermia, no subjective hypothermia Eyes: no blurry vision, no xerophthalmia ENT: no sore throat, no nodules palpated  in neck, no dysphagia, no odynophagia, no hoarseness Cardiovascular: no CP/no SOB/no palpitations/no leg swelling Respiratory: no cough/no SOB/no wheezing Gastrointestinal: no N/no V/no D/no C/no acid reflux Musculoskeletal: no muscle aches/no joint aches Skin: no rashes, no hair loss Neurological: no tremors/no numbness/no tingling/no dizziness  I reviewed pt's medications, allergies, PMH, social hx, family hx, and changes were documented in the history of present illness. Otherwise, unchanged from my initial visit note.  Past Medical History:  Diagnosis Date  . Arthritis    left foot  . Asthma   . Bell palsy 2015  . Diabetes mellitus without  complication ([place])    type 2  . Endometriosis   . GERD (gastroesophageal reflux disease)   . H/O hiatal hernia   . 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 Hypertension   . Kidney stone   . Kidney stones    Past Surgical History:  Procedure Laterality Date  . FOOT ARTHRODESIS Left 09/05/2019   Procedure: Left Modified McBride, Left First and Second Tarsometatarsal Joint Arthrodesis;  Surgeon: Wylene Simmer, MD;  Location: [facility];  Service: Orthopedics;  Laterality: Left;  . laparatomy  1985  . RADIAL HEAD ARTHROPLASTY Left 02/23/2013   Procedure: LEFT RADIAL HEAD ARTHROPLASTY qwith ligament reconstruction;  Surgeon: Roseanne Kaufman, MD;  Location: [facility];  Service: Orthopedics;  Laterality: Left;  . TONSILLECTOMY AND ADENOIDECTOMY  1967  . uterine lining ablation  2002   Social History   Socioeconomic History  . Marital status: Married    Spouse name: Not on file  . Number of children: Not on file  . Years of education: Not on file  . Highest education level: Not on file  Occupational History    Comment: unemployed  Tobacco Use  . Smoking status: Never Smoker  . Smokeless tobacco: Never Used  Substance and Sexual Activity  . Alcohol use: No  . Drug use: No  . Sexual activity: Yes    Partners: Male  Other Topics Concern  . Not on file  Social History Narrative   Exercise-- no   Social Determinants of Health   Financial Resource Strain:   . Difficulty of Paying Living Expenses:   Food Insecurity:   . Worried About Charity fundraiser in the Last Year:   . Arboriculturist in the Last Year:   Transportation Needs:   . Film/video editor (Medical):   [name] Lack of Transportation (Non-Medical):   Physical Activity:   . Days of Exercise per Week:   . Minutes of Exercise per Session:   Stress:   . Feeling of Stress :   Social Connections:   . Frequency of Communication with Friends and Family:   . Frequency of Social Gatherings with  Friends and Family:   . Attends Religious Services:   . Active Member of Clubs or Organizations:   . Attends Archivist Meetings:   [name] Marital Status:   Intimate Partner Violence:   . Fear of Current or Ex-Partner:   . Emotionally Abused:   [name] Physically Abused:   . Sexually Abused:    Current Outpatient Medications on File Prior to Visit  Medication Sig Dispense Refill  . albuterol (PROAIR HFA) 108 (90 Base) MCG/ACT inhaler Inhale 2 puffs into the lungs every 6 (six) hours as needed for wheezing or shortness of breath. 18 g 3  . atorvastatin (LIPITOR) 10 MG tablet Take 1 tablet (10 mg total) by mouth every other day.  45 tablet 1  . blood glucose meter kit and supplies Dispense based on patient and insurance preference. Use up to four times daily as directed. (FOR ICD-10 E10.9, E11.9). 1 each 0  . Blood Glucose Monitoring Suppl (CONTOUR NEXT ONE) KIT 1 kit by Does not apply route See admin instructions. For checking blood sugar 2 times a day 1 kit 0  . fenofibrate 160 MG tablet TAKE 1 TABLET(160 MG) BY MOUTH DAILY. 90 tablet 1  . fluticasone (FLONASE) 50 MCG/ACT nasal spray SHAKE LIQUID AND USE 2 SPRAYS IN EACH NOSTRIL DAILY 48 g 0  . fluticasone (FLOVENT HFA) 110 MCG/ACT inhaler Inhale 2 puffs into the lungs 2 (two) times daily. 36 g 0  . gabapentin (NEURONTIN) 100 MG capsule Take 1 capsule (100 mg total) by mouth 2 (two) times daily. Must be seen prior to future refills. 180 capsule 3  . glipiZIDE (GLUCOTROL XL) 5 MG 24 hr tablet TAKE 1 TABLET(5 MG) BY MOUTH DAILY WITH BREAKFAST 90 tablet 3  . glucose blood (CONTOUR NEXT TEST) test strip Use to check blood sugar 2 times a day. 200 each 12  . levalbuterol (XOPENEX) 1.25 MG/3ML nebulizer solution USE ONE VIAL BY NEBULIZATION EVERY 6 HOURS AS NEEDED FOR WHEEZING OR SHORTNESS OF BREATH 675 mL 1  . losartan (COZAAR) 100 MG tablet TAKE 1 TABLET(100 MG) BY MOUTH DAILY. 90 tablet 1  . metFORMIN (GLUCOPHAGE-XR) 500 MG 24 hr tablet Take 1000  mg with dinner 180 tablet 3  . montelukast (SINGULAIR) 10 MG tablet TAKE 1 TABLET(10 MG) BY MOUTH AT BEDTIME. 90 tablet 1  . omeprazole (PRILOSEC) 40 MG capsule Take 1 capsule (40 mg total) by mouth daily. 30 capsule 3  . Semaglutide, 1 MG/DOSE, (OZEMPIC, 1 MG/DOSE,) 2 MG/1.5ML SOPN Inject 1 mg into the skin once a week. 12 pen 3  . triamcinolone cream (KENALOG) 0.1 % Apply 1 application topically 2 (two) times daily. 30 g 0   No current facility-administered medications on file prior to visit.   No Known Allergies Family History  Problem Relation Age of Onset  . Diabetes Mother   . Hyperlipidemia Mother   . Hypertension Mother   . Diabetes Father   . Hyperlipidemia Father   . Hypertension Father     PE: There were no vitals taken for this visit. Wt Readings from Last 3 Encounters:  11/08/19 147 lb (66.7 kg)  09/05/19 150 lb 12.7 oz (68.4 kg)  08/09/19 152 lb 6.4 oz (69.1 kg)   Constitutional:  in NAD  The physical exam was not performed (virtual visit).  ASSESSMENT: 1. DM2, non-insulin-dependent, uncontrolled, without long-term complications, but with hyperglycemia  2. HL  PLAN:  1. Patient with longstanding, uncontrolled, type 2 diabetes, on Metformin and sulfonylurea and also weekly GLP-1 receptor agonist, with improved sugars in the past after adding an SGLT2 inhibitor, however, we had to stop this due to yeast infections.  Since then, sugars increased and we started glipizide XL and also Metformin ER.  We did not start IR Metformin due to patient being worried about diarrhea. -We did not change the regimen at last visit, since her sugars were almost all at goal with only occasional CBGs above target in the morning.  She had some slightly higher blood sugars later in the day but she was not checking enough in the second half of the day to detect trends.  I did advise her to try to check later, also.  HbA1c at that time was 6.3%.  She had another HbA1c several days ago and  this was even better, at 6.0%. -At this visit, she is only checking sugars in the morning and they are at goal, with the exception of 1 higher blood sugar, at 160, today.  She is not sure why this happened.  She has been less active since last visit due to her left foot surgery and she is preparing for right foot surgery in 02/2020.  She is trying to exercise her upper body. -For now, we do not need to change her regimen.  She is tolerating the Ozempic well, without GI side effects. -She is up-to-date with all of her diabetic prescriptions. - I suggested to:  Patient Instructions  Please continue: - Metformin ER 1000 mg with dinner - Glipizide XL 5 mg before b'fast - Ozempic 1 mg weekly  Please come back for a follow-up appointment in 6 months.  - advised to check sugars at different times of the day - 1x a day, rotating check times - advised for yearly eye exams >> she is UTD - return to clinic in 6 months  2. HL -Reviewed latest lipid panel: All fractions at goal: Lab Results  Component Value Date   CHOL 136 08/09/2019   HDL 42 08/09/2019   LDLCALC 71 08/09/2019   TRIG 128 08/09/2019   CHOLHDL 3.2 08/09/2019  -Continues Lipitor and fenofibrate without side effects  LABS NEED TO GO TO [facility] -patient's husband works there.  Philemon Kingdom, MD PhD [facility]

## 2019-12-09 ENCOUNTER — Other Ambulatory Visit: Payer: Self-pay | Admitting: Family Medicine

## 2019-12-18 ENCOUNTER — Encounter: Payer: Self-pay | Admitting: Family Medicine

## 2019-12-18 NOTE — Telephone Encounter (Signed)
Isn't the COVID clinic for patients that have had COVID?

## 2019-12-18 NOTE — Telephone Encounter (Signed)
If she is still having breathing problem--  We should get her into covid clnic

## 2019-12-19 NOTE — Telephone Encounter (Signed)
Ideally she should get it but I cant tell her how she will react to the second one---  where did she get the vaccine?  They should definitely watch her for 30 min at least and not the typical 15

## 2019-12-19 NOTE — Telephone Encounter (Signed)
I had another pt with side effect to vaccine and they saw her

## 2019-12-19 NOTE — Telephone Encounter (Signed)
Spoke with patient. Pt states sxs have resolved but wondering if she should get the second shot. Pt was advised of other patients having these sxs and typically the second shot is not as bad but pt wants to be sure that it is okay to get the second shot. Please advise

## 2020-01-06 ENCOUNTER — Encounter: Payer: Self-pay | Admitting: Neurology

## 2020-01-06 ENCOUNTER — Telehealth: Payer: Self-pay | Admitting: Family Medicine

## 2020-01-06 NOTE — Telephone Encounter (Signed)
Patient would like to speak with you regards to some covid exposure she had

## 2020-01-07 NOTE — Telephone Encounter (Signed)
Spoke with patient. Pt states having a rapid COVID test and was negative. Pt states she was exposed at the dentist office Thursday last week.

## 2020-02-01 ENCOUNTER — Other Ambulatory Visit: Payer: Self-pay | Admitting: Family Medicine

## 2020-02-01 DIAGNOSIS — E785 Hyperlipidemia, unspecified: Secondary | ICD-10-CM

## 2020-02-11 ENCOUNTER — Other Ambulatory Visit: Payer: Self-pay | Admitting: Family Medicine

## 2020-02-11 DIAGNOSIS — K219 Gastro-esophageal reflux disease without esophagitis: Secondary | ICD-10-CM

## 2020-02-14 ENCOUNTER — Ambulatory Visit (INDEPENDENT_AMBULATORY_CARE_PROVIDER_SITE_OTHER): Payer: 59 | Admitting: Family Medicine

## 2020-02-14 ENCOUNTER — Other Ambulatory Visit: Payer: Self-pay

## 2020-02-14 ENCOUNTER — Encounter: Payer: Self-pay | Admitting: Family Medicine

## 2020-02-14 VITALS — BP 120/78 | HR 93 | Temp 98.4°F | Resp 18 | Ht 63.5 in | Wt 154.2 lb

## 2020-02-14 DIAGNOSIS — Z23 Encounter for immunization: Secondary | ICD-10-CM | POA: Diagnosis not present

## 2020-02-14 DIAGNOSIS — E785 Hyperlipidemia, unspecified: Secondary | ICD-10-CM | POA: Diagnosis not present

## 2020-02-14 DIAGNOSIS — M792 Neuralgia and neuritis, unspecified: Secondary | ICD-10-CM | POA: Insufficient documentation

## 2020-02-14 DIAGNOSIS — I10 Essential (primary) hypertension: Secondary | ICD-10-CM | POA: Diagnosis not present

## 2020-02-14 DIAGNOSIS — K219 Gastro-esophageal reflux disease without esophagitis: Secondary | ICD-10-CM | POA: Diagnosis not present

## 2020-02-14 DIAGNOSIS — G629 Polyneuropathy, unspecified: Secondary | ICD-10-CM | POA: Diagnosis not present

## 2020-02-14 MED ORDER — GABAPENTIN 100 MG PO CAPS: ORAL_CAPSULE | ORAL | 3 refills | Status: DC

## 2020-02-14 MED ORDER — NONFORMULARY OR COMPOUNDED ITEM: 0 refills | Status: AC

## 2020-02-14 NOTE — Assessment & Plan Note (Signed)
Stable con't omeprazole prn

## 2020-02-14 NOTE — Patient Instructions (Signed)

## 2020-02-14 NOTE — Assessment & Plan Note (Signed)
Encouraged heart healthy diet, increase exercise, avoid trans fats, consider a krill oil cap daily 

## 2020-02-14 NOTE — Assessment & Plan Note (Signed)
Well controlled, no changes to meds. Encouraged heart healthy diet such as the DASH diet and exercise as tolerated.  °

## 2020-02-14 NOTE — Progress Notes (Signed)
Patient ID: Dominique [name], female    DOB: May 14, 1961  Age: 59 y.o. MRN: [id number]    Subjective:  Subjective  HPI DARNELLA ZEITER presents for f/u gerd, htn, hyperlipidemia She has a f/u with endo Pt is having more numbness in her feet and she would like to adjust the meds   Review of Systems  Constitutional: Negative for appetite change, diaphoresis, fatigue and unexpected weight change.  Eyes: Negative for pain, redness and visual disturbance.  Respiratory: Negative for cough, chest tightness, shortness of breath and wheezing.   Cardiovascular: Negative for chest pain, palpitations and leg swelling.  Endocrine: Negative for cold intolerance, heat intolerance, polydipsia, polyphagia and polyuria.  Genitourinary: Negative for difficulty urinating, dysuria and frequency.  Neurological: Positive for numbness. Negative for dizziness, light-headedness and headaches.    History Past Medical History:  Diagnosis Date  . Arthritis    left foot  . Asthma   . Bell palsy 2015  . Diabetes mellitus without complication ([place])    type 2  . Endometriosis   . GERD (gastroesophageal reflux disease)   . H/O hiatal hernia   . 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 Hypertension   . Kidney stone   . Kidney stones     She has a past surgical history that includes Tonsillectomy and adenoidectomy (1967); uterine lining ablation (2002); laparatomy (1985); Radial head arthroplasty (Left, 02/23/2013); and Foot arthrodesis (Left, 09/05/2019).   Her family history includes Diabetes in her father and mother; Hyperlipidemia in her father and mother; Hypertension in her father and mother.She reports that she has never smoked. She has never used smokeless tobacco. She reports that she does not drink alcohol and does not use drugs.  Current Outpatient Medications on File Prior to Visit  Medication Sig Dispense Refill  . albuterol (PROAIR HFA) 108 (90 Base) MCG/ACT inhaler Inhale 2  puffs into the lungs every 6 (six) hours as needed for wheezing or shortness of breath. 18 g 3  . atorvastatin (LIPITOR) 10 MG tablet TAKE 1 TABLET(10 MG) BY MOUTH EVERY OTHER DAY 45 tablet 1  . blood glucose meter kit and supplies Dispense based on patient and insurance preference. Use up to four times daily as directed. (FOR ICD-10 E10.9, E11.9). 1 each 0  . Blood Glucose Monitoring Suppl (CONTOUR NEXT ONE) KIT 1 kit by Does not apply route See admin instructions. For checking blood sugar 2 times a day 1 kit 0  . fenofibrate 160 MG tablet TAKE 1 TABLET(160 MG) BY MOUTH DAILY. 90 tablet 1  . fluticasone (FLONASE) 50 MCG/ACT nasal spray SHAKE LIQUID AND USE 2 SPRAYS IN EACH NOSTRIL DAILY 48 g 0  . fluticasone (FLOVENT HFA) 110 MCG/ACT inhaler Inhale 2 puffs into the lungs 2 (two) times daily. 36 g 0  . glipiZIDE (GLUCOTROL XL) 5 MG 24 hr tablet TAKE 1 TABLET(5 MG) BY MOUTH DAILY WITH BREAKFAST 90 tablet 3  . glucose blood (CONTOUR NEXT TEST) test strip Use to check blood sugar 2 times a day. 200 each 12  . levalbuterol (XOPENEX) 1.25 MG/3ML nebulizer solution USE ONE VIAL BY NEBULIZATION EVERY 6 HOURS AS NEEDED FOR WHEEZING OR SHORTNESS OF BREATH 675 mL 1  . losartan (COZAAR) 100 MG tablet TAKE 1 TABLET(100 MG) BY MOUTH DAILY. 90 tablet 1  . metFORMIN (GLUCOPHAGE-XR) 500 MG 24 hr tablet Take 1000 mg with dinner 180 tablet 3  . montelukast (SINGULAIR) 10 MG tablet TAKE 1 TABLET(10 MG) BY MOUTH AT BEDTIME.  90 tablet 1  . omeprazole (PRILOSEC) 40 MG capsule TAKE 1 CAPSULE(40 MG) BY MOUTH DAILY 30 capsule 3  . Semaglutide, 1 MG/DOSE, (OZEMPIC, 1 MG/DOSE,) 2 MG/1.5ML SOPN Inject 1 mg into the skin once a week. 12 pen 3   No current facility-administered medications on file prior to visit.     Objective:  Objective  Physical Exam Vitals and nursing note reviewed.  Constitutional:      Appearance: She is well-developed.  HENT:     Head: Normocephalic and atraumatic.  Eyes:      Conjunctiva/sclera: Conjunctivae normal.  Neck:     Thyroid: No thyromegaly.     Vascular: No carotid bruit or JVD.  Cardiovascular:     Rate and Rhythm: Normal rate and regular rhythm.     Heart sounds: Normal heart sounds. No murmur heard.   Pulmonary:     Effort: Pulmonary effort is normal. No respiratory distress.     Breath sounds: Normal breath sounds. No wheezing or rales.  Chest:     Chest wall: No tenderness.  Musculoskeletal:     Cervical back: Normal range of motion and neck supple.  Neurological:     Mental Status: She is alert and oriented to person, place, and time.    BP 120/78 (BP Location: Right Arm, Patient Position: Sitting, Cuff Size: Normal)   Pulse 93   Temp 98.4 F (36.9 C) (Oral)   Resp 18   Ht 5' 3.5" (1.613 m)   Wt 154 lb 3.2 oz (69.9 kg)   SpO2 97%   BMI 26.89 kg/m  Wt Readings from Last 3 Encounters:  02/14/20 154 lb 3.2 oz (69.9 kg)  11/08/19 147 lb (66.7 kg)  09/05/19 150 lb 12.7 oz (68.4 kg)     Lab Results  Component Value Date   WBC 4.9 03/24/2014   HGB 16.5 (A) 03/24/2014   HCT 48 (A) 03/24/2014   PLT 166 03/24/2014   GLUCOSE 89 08/09/2019   CHOL 136 08/09/2019   TRIG 128 08/09/2019   HDL 42 08/09/2019   LDLCALC 71 08/09/2019   ALT 46 (H) 08/09/2019   AST 31 08/09/2019   NA 143 08/09/2019   K 4.2 08/09/2019   CL 107 (H) 08/09/2019   CREATININE 0.79 08/09/2019   BUN 13 08/09/2019   CO2 21 08/09/2019   TSH 1.56 03/24/2014   HGBA1C 6.0 (H) 11/25/2019    No results found.   Assessment & Plan:  Plan  I have discontinued Mardene Celeste A. Hayter "Trisha"'s triamcinolone cream. I have also changed her gabapentin. Additionally, I am having her start on NONFORMULARY OR COMPOUNDED ITEM. Lastly, I am having her maintain her metFORMIN, glipiZIDE, blood glucose meter kit and supplies, Flovent HFA, fenofibrate, losartan, montelukast, Contour Next One, albuterol, Contour Next Test, Ozempic (1 MG/DOSE), levalbuterol, fluticasone,  atorvastatin, and omeprazole.  Meds ordered this encounter  Medications  . gabapentin (NEURONTIN) 100 MG capsule    Sig: 2 po bid    Dispense:  360 capsule    Refill:  3  . NONFORMULARY OR COMPOUNDED ITEM    Sig: Cmp, lipid   Dx hyperlipidemia    Dispense:  1 each    Refill:  0    Problem List Items Addressed This Visit      Unprioritized   Essential hypertension    Well controlled, no changes to meds. Encouraged heart healthy diet such as the DASH diet and exercise as tolerated.       GERD  Stable con't omeprazole prn      Hyperlipidemia    Encouraged heart healthy diet, increase exercise, avoid trans fats, consider a krill oil cap daily      Relevant Medications   NONFORMULARY OR COMPOUNDED ITEM   Neuropathy - Primary    gabepentin dose adjusted  F/u prn       Relevant Medications   gabapentin (NEURONTIN) 100 MG capsule    Other Visit Diagnoses    Need for influenza vaccination       Relevant Orders   Flu Vaccine QUAD 36+ mos IM (Completed)   Need for pneumococcal vaccination       Relevant Orders   Pneumococcal polysaccharide vaccine 23-valent greater than or equal to 2yo subcutaneous/IM (Completed)      Follow-up: Return in about 1 week (around 02/21/2020), or if symptoms worsen or fail to improve, for shingrix #1  and then 2 months later for #2.  Ann Held, DO

## 2020-02-14 NOTE — Assessment & Plan Note (Signed)
gabepentin dose adjusted  F/u prn

## 2020-02-20 ENCOUNTER — Other Ambulatory Visit: Payer: Self-pay | Admitting: Family Medicine

## 2020-02-21 LAB — COMPREHENSIVE METABOLIC PANEL
ALT: 44 IU/L — ABNORMAL HIGH (ref 0–32)
AST: 34 IU/L (ref 0–40)
Albumin/Globulin Ratio: 1.8 (ref 1.2–2.2)
Albumin: 4.4 g/dL (ref 3.8–4.8)
Alkaline Phosphatase: 89 IU/L (ref 44–121)
BUN/Creatinine Ratio: 16 (ref 12–28)
BUN: 12 mg/dL (ref 8–27)
Bilirubin Total: 0.8 mg/dL (ref 0.0–1.2)
CO2: 24 mmol/L (ref 20–29)
Calcium: 9.7 mg/dL (ref 8.7–10.3)
Chloride: 104 mmol/L (ref 96–106)
Creatinine, Ser: 0.75 mg/dL (ref 0.57–1.00)
GFR calc Af Amer: 99 mL/min/{1.73_m2} (ref >59)
GFR calc non Af Amer: 86 mL/min/{1.73_m2} (ref >59)
Globulin, Total: 2.5 g/dL (ref 1.5–4.5)
Glucose: 143 mg/dL — ABNORMAL HIGH (ref 65–99)
Potassium: 4.4 mmol/L (ref 3.5–5.2)
Sodium: 141 mmol/L (ref 134–144)
Total Protein: 6.9 g/dL (ref 6.0–8.5)

## 2020-02-21 LAB — LIPID PANEL W/O CHOL/HDL RATIO
Cholesterol, Total: 129 mg/dL (ref 100–199)
HDL: 36 mg/dL — ABNORMAL LOW (ref >39)
LDL Chol Calc (NIH): 71 mg/dL (ref 0–99)
Triglycerides: 121 mg/dL (ref 0–149)
VLDL Cholesterol Cal: 22 mg/dL (ref 5–40)

## 2020-02-26 ENCOUNTER — Encounter: Payer: Self-pay | Admitting: Family Medicine

## 2020-03-02 ENCOUNTER — Encounter: Payer: Self-pay | Admitting: Family Medicine

## 2020-03-02 NOTE — Telephone Encounter (Signed)
Pt viewed via Mychart 

## 2020-03-02 NOTE — Telephone Encounter (Signed)
See labs --- I had not seen them yet

## 2020-03-08 ENCOUNTER — Other Ambulatory Visit: Payer: Self-pay | Admitting: Family Medicine

## 2020-03-09 ENCOUNTER — Encounter: Payer: Self-pay | Admitting: Family Medicine

## 2020-03-10 ENCOUNTER — Ambulatory Visit (INDEPENDENT_AMBULATORY_CARE_PROVIDER_SITE_OTHER): Payer: 59

## 2020-03-10 ENCOUNTER — Other Ambulatory Visit: Payer: Self-pay

## 2020-03-10 DIAGNOSIS — Z23 Encounter for immunization: Secondary | ICD-10-CM

## 2020-03-10 NOTE — Progress Notes (Signed)
After obtaining consent, and per orders of Dr. Dr. Laury Axon, injection of Shingrix 0.52mL in right deltoid given by Jones Bales. Patient tolerated well. VIS given. [facility] updated.

## 2020-03-26 ENCOUNTER — Encounter: Payer: Self-pay | Admitting: Family Medicine

## 2020-03-26 ENCOUNTER — Other Ambulatory Visit: Payer: Self-pay

## 2020-03-26 DIAGNOSIS — J4520 Mild intermittent asthma, uncomplicated: Secondary | ICD-10-CM

## 2020-03-26 DIAGNOSIS — J302 Other seasonal allergic rhinitis: Secondary | ICD-10-CM

## 2020-03-26 MED ORDER — ALBUTEROL SULFATE HFA 108 (90 BASE) MCG/ACT IN AERS: 2.0000 | INHALATION_SPRAY | Freq: Four times a day (QID) | RESPIRATORY_TRACT | 3 refills | Status: DC | PRN

## 2020-03-27 MED ORDER — ALBUTEROL SULFATE HFA 108 (90 BASE) MCG/ACT IN AERS: 2.0000 | INHALATION_SPRAY | Freq: Four times a day (QID) | RESPIRATORY_TRACT | 3 refills | Status: DC | PRN

## 2020-03-27 MED ORDER — FLOVENT HFA 110 MCG/ACT IN AERO: 2.0000 | INHALATION_SPRAY | Freq: Two times a day (BID) | RESPIRATORY_TRACT | 1 refills | Status: DC

## 2020-05-05 ENCOUNTER — Encounter: Payer: Self-pay | Admitting: Family Medicine

## 2020-05-05 DIAGNOSIS — I10 Essential (primary) hypertension: Secondary | ICD-10-CM

## 2020-05-05 MED ORDER — LOSARTAN POTASSIUM 100 MG PO TABS: ORAL_TABLET | ORAL | 1 refills | Status: DC

## 2020-05-06 ENCOUNTER — Encounter: Payer: Self-pay | Admitting: Family Medicine

## 2020-05-06 DIAGNOSIS — J302 Other seasonal allergic rhinitis: Secondary | ICD-10-CM

## 2020-05-06 DIAGNOSIS — E785 Hyperlipidemia, unspecified: Secondary | ICD-10-CM

## 2020-05-06 MED ORDER — MONTELUKAST SODIUM 10 MG PO TABS: ORAL_TABLET | ORAL | 1 refills | Status: DC

## 2020-05-06 MED ORDER — FENOFIBRATE 160 MG PO TABS: ORAL_TABLET | ORAL | 1 refills | Status: DC

## 2020-05-12 ENCOUNTER — Ambulatory Visit: Payer: 59

## 2020-05-14 ENCOUNTER — Encounter: Payer: Self-pay | Admitting: Family Medicine

## 2020-05-17 ENCOUNTER — Encounter: Payer: Self-pay | Admitting: Family Medicine

## 2020-05-18 NOTE — Telephone Encounter (Signed)
I answered this earlier -- she should wait a month and only then if her covid symptoms have resolved How is her asthma?  She may need infusion clinic

## 2020-05-18 NOTE — Telephone Encounter (Signed)
Wait a month

## 2020-05-31 ENCOUNTER — Other Ambulatory Visit: Payer: Self-pay | Admitting: Internal Medicine

## 2020-06-04 ENCOUNTER — Ambulatory Visit: Payer: 59 | Admitting: Internal Medicine

## 2020-06-04 ENCOUNTER — Encounter: Payer: Self-pay | Admitting: Internal Medicine

## 2020-06-04 ENCOUNTER — Other Ambulatory Visit: Payer: Self-pay

## 2020-06-04 VITALS — BP 128/80 | HR 86 | Ht 63.0 in | Wt 152.2 lb

## 2020-06-04 DIAGNOSIS — E1165 Type 2 diabetes mellitus with hyperglycemia: Secondary | ICD-10-CM | POA: Diagnosis not present

## 2020-06-04 DIAGNOSIS — E785 Hyperlipidemia, unspecified: Secondary | ICD-10-CM | POA: Diagnosis not present

## 2020-06-04 LAB — POCT GLYCOSYLATED HEMOGLOBIN (HGB A1C): Hemoglobin A1C: 6.7 % — AB (ref 4.0–5.6)

## 2020-06-04 MED ORDER — CONTOUR NEXT TEST VI STRP: ORAL_STRIP | 12 refills | Status: DC

## 2020-06-04 NOTE — Patient Instructions (Addendum)
Please continue: - Metformin ER 1000 mg with dinner - Glipizide XL 5 mg before b'fast - Ozempic 1 mg weekly  Please start checking 1x a day, rotating check times.  Please come back for a follow-up appointment in 4 months.

## 2020-06-04 NOTE — Progress Notes (Signed)
Patient ID: Dominique [name], female   DOB: [birth year], 60 y.o.   MRN: [id number]   This visit occurred during the SARS-CoV-2 public health emergency.  Safety protocols were in place, including screening questions prior to the visit, additional usage of staff PPE, and extensive cleaning of exam room while observing appropriate contact time as indicated for disinfecting solutions.   HPI: ABRIL [name] is a 60 y.o.-year-old female, returning for follow-up for DM2, dx in 2016, but GDM in 2000, non-insulin-dependent, uncontrolled, without long term complications.  She previously saw Dr. Loanne Drilling.  Last visit with me 6 months ago (virtual).  She had R foot surgery in 08/2019. She is not completely healed.  She has not been exercising.  And HbA1c levels: Lab Results  Component Value Date   HGBA1C 6.0 (H) 11/25/2019   HGBA1C 6.3 (A) 06/19/2019   HGBA1C 7.1 (A) 02/12/2019   HGBA1C 6.6 (A) 10/12/2018   HGBA1C 5.9 (A) 06/13/2018   HGBA1C 6.5 (A) 02/22/2018   HGBA1C 7.5 06/20/2017   HGBA1C 6.4 (A) 12/09/2014   HGBA1C 11.9 (A) 03/24/2014  HbA1c 8.9%  She is on: - Metformin ER 1000 mg with dinner  - added 01/2019 - Glipizide XL 5 mg before breakfast - added 07/2018 - Ozempic 0.5 >> 1 mg weekly We have to stop Jardiance 07/2018 due to persistent yeast infections. She stopped metformin due to lack of effect. She developed a rash with Trulicity She was previously on nateglinide and also on repaglinide.  Pt checks her sugars once a day: - am: 115-144, 154, 159 >> 116, 125-130, 160 >> 119, 123-159 - 2h after b'fast: 78, 152 >> n/c >> 135 >> n/c >> 198 >> n/c - before lunch: 97 >> 105 >> n/c - 2h after lunch: 102-132 >> n/c >> 99, 161 >> n/c >> 170 >> n/c - before dinner: 147 >> n/c - 2h after dinner: 132-175, 199 >> n/c >> 211 >> n/c - bedtime: n/c - nighttime: n/c Lowest sugar was 78 ... 160 >> 112 >> 116 >> 119; she has hypoglycemia awareness in the 90s. Highest sugar was 211 >> 200 >>  160 >> 159.  Glucometer: Accu-Chek guide  Pt's meals are: - Breakfast: 1-2 egg, 1-2 toast; egg + bacon - Lunch: eating out, salad, meat + veggies - Dinner: eating out, salad, meat + veggies - Dessert: apple pie, cake - Snacks: chips  -No CKD, last BUN/creatinine:  Lab Results  Component Value Date   BUN 12 02/20/2020   BUN 13 08/09/2019   CREATININE 0.75 02/20/2020   CREATININE 0.79 08/09/2019  On losartan 100.  -+ HL; last set of lipids: Lab Results  Component Value Date   CHOL 129 02/20/2020   HDL 36 (L) 02/20/2020   LDLCALC 71 02/20/2020   TRIG 121 02/20/2020   CHOLHDL 3.2 08/09/2019  On Lipitor 10, fenofibrate 160, fish oil 1000 mg 2x a day.  - last eye exam was in 07/2019: No DR reportedly; Dr. Sabra Heck. Sees an ophthalmic surgeon >> contemplates palpebral surgery. Started Xiidra and fish oil for dry eyes.  -No numbness and tingling in her feet  On Gabapentin for Bell's palsy. She has a history of a kidney stone in 2019.  Pt has FH of DM in mother and father.  ROS: Constitutional: no weight gain/no weight loss, no fatigue, no subjective hyperthermia, no subjective hypothermia Eyes: no blurry vision, + xerophthalmia ENT: no sore throat, no nodules palpated in neck, no dysphagia, no odynophagia, no hoarseness Cardiovascular:  no CP/no SOB/no palpitations/no leg swelling Respiratory: no cough/no SOB/no wheezing Gastrointestinal: no N/no V/no D/no C/no acid reflux Musculoskeletal: no muscle aches/no joint aches Skin: no rashes, no hair loss Neurological: no tremors/no numbness/no tingling/no dizziness  I reviewed pt's medications, allergies, PMH, social hx, family hx, and changes were documented in the history of present illness. Otherwise, unchanged from my initial visit note.  Past Medical History:  Diagnosis Date  . Arthritis    left foot  . Asthma   . Bell palsy 2015  . Diabetes mellitus without complication (HCC)    type 2  . Endometriosis   . GERD  (gastroesophageal reflux disease)   . H/O hiatal hernia   . 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 Hypertension   . Kidney stone   . Kidney stones    Past Surgical History:  Procedure Laterality Date  . FOOT ARTHRODESIS Left 09/05/2019   Procedure: Left Modified McBride, Left First and Second Tarsometatarsal Joint Arthrodesis;  Surgeon: Hewitt, John, MD;  Location: [facility];  Service: Orthopedics;  Laterality: Left;  . laparatomy  1985  . RADIAL HEAD ARTHROPLASTY Left 02/23/2013   Procedure: LEFT RADIAL HEAD ARTHROPLASTY qwith ligament reconstruction;  Surgeon: William Gramig, MD;  Location: [facility];  Service: Orthopedics;  Laterality: Left;  . TONSILLECTOMY AND ADENOIDECTOMY  1967  . uterine lining ablation  2002   Social History   Socioeconomic History  . Marital status: Married    Spouse name: Not on file  . Number of children: Not on file  . Years of education: Not on file  . Highest education level: Not on file  Occupational History    Comment: unemployed  Tobacco Use  . Smoking status: Never Smoker  . Smokeless tobacco: Never Used  Substance and Sexual Activity  . Alcohol use: No  . Drug use: No  . Sexual activity: Yes    Partners: Male  Other Topics Concern  . Not on file  Social History Narrative   Exercise-- no   Social Determinants of Health   Financial Resource Strain: Not on file  Food Insecurity: Not on file  Transportation Needs: Not on file  Physical Activity: Not on file  Stress: Not on file  Social Connections: Not on file  Intimate Partner Violence: Not on file   Current Outpatient Medications on File Prior to Visit  Medication Sig Dispense Refill  . albuterol (PROAIR HFA) 108 (90 Base) MCG/ACT inhaler Inhale 2 puffs into the lungs every 6 (six) hours as needed for wheezing or shortness of breath. 18 g 3  . atorvastatin (LIPITOR) 10 MG tablet TAKE 1 TABLET(10 MG) BY MOUTH EVERY OTHER DAY 45 tablet 1  . blood glucose  meter kit and supplies Dispense based on patient and insurance preference. Use up to four times daily as directed. (FOR ICD-10 E10.9, E11.9). 1 each 0  . Blood Glucose Monitoring Suppl (CONTOUR NEXT ONE) KIT 1 kit by Does not apply route See admin instructions. For checking blood sugar 2 times a day 1 kit 0  . fenofibrate 160 MG tablet TAKE 1 TABLET(160 MG) BY MOUTH DAILY. 90 tablet 1  . fluticasone (FLONASE) 50 MCG/ACT nasal spray SHAKE LIQUID AND USE 2 SPRAYS IN EACH NOSTRIL DAILY 48 g 1  . fluticasone (FLOVENT HFA) 110 MCG/ACT inhaler Inhale 2 puffs into the lungs 2 (two) times daily. 36 g 1  . gabapentin (NEURONTIN) 100 MG capsule 2 po bid 360 capsule 3  . glipiZIDE (  GLUCOTROL XL) 5 MG 24 hr tablet TAKE 1 TABLET(5 MG) BY MOUTH DAILY WITH BREAKFAST 90 tablet 3  . glucose blood (CONTOUR NEXT TEST) test strip Use to check blood sugar 2 times a day. 200 each 12  . levalbuterol (XOPENEX) 1.25 MG/3ML nebulizer solution USE ONE VIAL BY NEBULIZATION EVERY 6 HOURS AS NEEDED FOR WHEEZING OR SHORTNESS OF BREATH 675 mL 1  . losartan (COZAAR) 100 MG tablet TAKE 1 TABLET(100 MG) BY MOUTH DAILY. 90 tablet 1  . metFORMIN (GLUCOPHAGE-XR) 500 MG 24 hr tablet Take 1000 mg with dinner 180 tablet 3  . montelukast (SINGULAIR) 10 MG tablet TAKE 1 TABLET(10 MG) BY MOUTH AT BEDTIME. 90 tablet 1  . NONFORMULARY OR COMPOUNDED ITEM Cmp, lipid   Dx hyperlipidemia 1 each 0  . omeprazole (PRILOSEC) 40 MG capsule TAKE 1 CAPSULE(40 MG) BY MOUTH DAILY 30 capsule 3  . OZEMPIC, 1 MG/DOSE, 4 MG/3ML SOPN INJECT 1 MG UNDER THE SKIN ONCE A WEEK 9 mL 0   No current facility-administered medications on file prior to visit.   No Known Allergies Family History  Problem Relation Age of Onset  . Diabetes Mother   . Hyperlipidemia Mother   . Hypertension Mother   . Diabetes Father   . Hyperlipidemia Father   . Hypertension Father     PE: BP 128/80   Pulse 86   Ht 5' 3" (1.6 m)   Wt 152 lb 3.2 oz (69 kg)   SpO2 99%   BMI  26.96 kg/m  Wt Readings from Last 3 Encounters:  06/04/20 152 lb 3.2 oz (69 kg)  02/14/20 154 lb 3.2 oz (69.9 kg)  11/08/19 147 lb (66.7 kg)   Constitutional: normal weight, in NAD Eyes: PERRLA, EOMI, no exophthalmos ENT: moist mucous membranes, no thyromegaly, no cervical lymphadenopathy Cardiovascular: RRR, No MRG Respiratory: CTA B Gastrointestinal: abdomen soft, NT, ND, BS+ Musculoskeletal: no deformities, strength intact in all 4 Skin: moist, warm, no rashes Neurological: no tremor with outstretched hands, DTR normal in all 4  ASSESSMENT: 1. DM2, non-insulin-dependent, uncontrolled, without long-term complications, but with hyperglycemia  2. HL  PLAN:  1. Patient with longstanding, uncontrolled, type 2 diabetes, on metformin, and also weekly GLP-1 receptor agonist, with improved sugars in the past after adding an extra 2 inhibitor, however, we had to stop this due to yeast infections.  Since then, sugars worsened and we had to start glipizide XL and also metformin ER.  We started this rather than metformin IR due to concerns about diarrhea.  At last visit, she was only checking sugars in the morning and they were at goal with exception of a blood sugar at 160.  She was less active due to left foot surgery and she was preparing for right foot surgery.  At that time, we could not check her HbA1c due to the virtual nature of the appointment.  Most recent HbA1c was excellent: Lab Results  Component Value Date   HGBA1C 6.0 (H) 11/25/2019  -At today's visit, sugars are higher than target at most checks in the last 2 weeks/m download; unfortunately, she is not checking sugars later in the day and I strongly advised her to do so.  Checking sugars later in the day will bring further awareness about her diabetes and will likely trigger a change in diet.  Therefore, for now, I would not suggest a change in regimen but we may need to do escalate her treatment at next visit, if sugars do not  improve -   I suggested to:  Patient Instructions  Please continue: - Metformin ER 1000 mg with dinner - Glipizide XL 5 mg before b'fast - Ozempic 1 mg weekly  Please start checking 1x a day, rotating check times.  Please come back for a follow-up appointment in 4 months.  - we checked her HbA1c: 6.7% (higher) - advised to check sugars at different times of the day - 1x a day, rotating check times - advised for yearly eye exams >> she is UTD - return to clinic in 4 months  2. HL -Reviewed latest lipid panel from 02/2020: Fractions at goal with the exception of a slightly low HDL: Lab Results  Component Value Date   CHOL 129 02/20/2020   HDL 36 (L) 02/20/2020   LDLCALC 71 02/20/2020   TRIG 121 02/20/2020   CHOLHDL 3.2 08/09/2019  -Continues Lipitor 10 and fenofibrate 160 without side effects.  Also, she added fish oil for dry eyes.  LABS NEED TO GO TO [facility] -patient's husband works there.  Cristina Gherghe, MD PhD [facility]  

## 2020-06-05 ENCOUNTER — Ambulatory Visit: Payer: 59 | Admitting: Internal Medicine

## 2020-06-10 ENCOUNTER — Other Ambulatory Visit: Payer: Self-pay | Admitting: Family Medicine

## 2020-06-11 ENCOUNTER — Encounter: Payer: Self-pay | Admitting: Internal Medicine

## 2020-06-16 ENCOUNTER — Other Ambulatory Visit: Payer: Self-pay

## 2020-06-16 ENCOUNTER — Ambulatory Visit (INDEPENDENT_AMBULATORY_CARE_PROVIDER_SITE_OTHER): Payer: 59

## 2020-06-16 DIAGNOSIS — Z23 Encounter for immunization: Secondary | ICD-10-CM | POA: Diagnosis not present

## 2020-06-16 NOTE — Progress Notes (Signed)
Patient here today for second shingrix. 0.105mL given in right deltoid IM. Patient tolerated well.

## 2020-07-20 ENCOUNTER — Other Ambulatory Visit: Payer: Self-pay | Admitting: Internal Medicine

## 2020-07-24 ENCOUNTER — Other Ambulatory Visit: Payer: Self-pay | Admitting: Family Medicine

## 2020-07-24 DIAGNOSIS — E785 Hyperlipidemia, unspecified: Secondary | ICD-10-CM

## 2020-07-27 ENCOUNTER — Other Ambulatory Visit: Payer: Self-pay | Admitting: Internal Medicine

## 2020-07-28 ENCOUNTER — Encounter: Payer: Self-pay | Admitting: Family Medicine

## 2020-07-29 NOTE — Telephone Encounter (Signed)
Please advise 

## 2020-07-29 NOTE — Telephone Encounter (Signed)
FYI

## 2020-07-29 NOTE — Telephone Encounter (Signed)
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

## 2020-07-30 NOTE — Telephone Encounter (Signed)
I never said it was life long--- neuropathy is numbness  Dm is life long

## 2020-08-04 ENCOUNTER — Ambulatory Visit: Payer: 59 | Admitting: Neurology

## 2020-08-04 ENCOUNTER — Encounter: Payer: Self-pay | Admitting: Neurology

## 2020-08-04 VITALS — BP 123/74 | HR 93 | Ht 63.0 in | Wt 150.0 lb

## 2020-08-04 DIAGNOSIS — G510 Bell's palsy: Secondary | ICD-10-CM | POA: Diagnosis not present

## 2020-08-04 NOTE — Progress Notes (Signed)
PATIENT: Dominique [name] DOB: February 08, 1961  REASON FOR VISIT: follow up HISTORY FROM: patient  HISTORY OF PRESENT ILLNESS: Today 08/04/20  Dominique [name] is a 60 year old female with history of right-sided Bell's palsy in 2014.  She remains on gabapentin for the prior Bell's Palsy discomfort/swelling/hypersensitivity usually flares during times of stress. She initially had right-sided Bell's palsy November 2014, never regained full function of the right face, had another episode October 2016, and again in September 2018.  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 Her initial Bell's palsy in 2014, occurred at a time when she had undiagnosed diabetes with an A1c around 13, and she had just undergone surgery for a broken radial head.   She had left foot surgery back in April 2021.  Since then, has had continued pain.  Reports sensation bottom of the foot is swollen when she walks, makes her feel unsteady.  Has numbness to the lateral aspect of the left foot.  The whole foot is hypersensitive.  There is no back pain, or pain down the left left.  No pain to the right foot.  She did not have any of these issues prior surgery.  At some point was told she had neuropathy, as result, she was denied life insurance.  We have prescribed gabapentin 100 mg twice daily for her prior Bell's palsy, during the last year, her dose has been increased due to to the left foot pain.  Here today for evaluation accompanied by her husband.  Of note, her last A1c in January 2022 was 6.7.  HISTORY  07/31/2019 SS: Dominique [name] is a 60 year old female with history of right-sided Bell's palsy in 2014.  She remains on gabapentin with benefit. CT scan of the brain in April 2019 was unremarkable.  She does have a sensation of right-sided swelling, like a brick, specifically whenever she gets stressed or upset.  Can also be triggered if she has been talking a lot.  She initially had right-sided Bell's palsy in  November 2014, never regained full function of the right face, had another episode October 2016, and again in September 2018.  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  She is planning to have surgery on her left foot for bunion with Dr. Doran Durand coming up. Her initial Bell's palsy in 2014, occurred at a time when she had undiagnosed diabetes with an A1c around 13, and she had just undergone surgery for a broken radial head.  She presents today for evaluation via virtual visit.  REVIEW OF SYSTEMS: Out of a complete 14 system review of symptoms, the patient complains only of the following symptoms, and all other reviewed systems are negative.  See HPI  ALLERGIES: No Known Allergies  HOME MEDICATIONS: Outpatient Medications Prior to Visit  Medication Sig Dispense Refill  . albuterol (PROAIR HFA) 108 (90 Base) MCG/ACT inhaler Inhale 2 puffs into the lungs every 6 (six) hours as needed for wheezing or shortness of breath. 18 g 3  . atorvastatin (LIPITOR) 10 MG tablet TAKE 1 TABLET(10 MG) BY MOUTH EVERY OTHER DAY 45 tablet 1  . blood glucose meter kit and supplies Dispense based on patient and insurance preference. Use up to four times daily as directed. (FOR ICD-10 E10.9, E11.9). 1 each 0  . Blood Glucose Monitoring Suppl (CONTOUR NEXT ONE) KIT 1 kit by Does not apply route See admin instructions. For checking blood sugar 2 times a day 1 kit 0  .  fenofibrate 160 MG tablet TAKE 1 TABLET(160 MG) BY MOUTH DAILY. 90 tablet 1  . fluticasone (FLONASE) 50 MCG/ACT nasal spray Place 2 sprays into both nostrils daily. 48 g 3  . fluticasone (FLOVENT HFA) 110 MCG/ACT inhaler Inhale 2 puffs into the lungs 2 (two) times daily. 36 g 1  . gabapentin (NEURONTIN) 100 MG capsule 2 po bid (Patient taking differently: Taking 200 MG 3 times a day) 360 capsule 3  . glipiZIDE (GLUCOTROL XL) 5 MG 24 hr tablet TAKE 1 TABLET(5 MG) BY MOUTH DAILY WITH BREAKFAST 90 tablet 3  . glucose blood (CONTOUR NEXT  TEST) test strip Use to check blood sugar 2 times a day. 200 each 12  . levalbuterol (XOPENEX) 1.25 MG/3ML nebulizer solution USE ONE VIAL BY NEBULIZATION EVERY 6 HOURS AS NEEDED FOR WHEEZING OR SHORTNESS OF BREATH 675 mL 1  . losartan (COZAAR) 100 MG tablet TAKE 1 TABLET(100 MG) BY MOUTH DAILY. 90 tablet 1  . metFORMIN (GLUCOPHAGE-XR) 500 MG 24 hr tablet TAKE 2 TABLETS BY MOUTH WITH DINNER 180 tablet 3  . montelukast (SINGULAIR) 10 MG tablet TAKE 1 TABLET(10 MG) BY MOUTH AT BEDTIME. 90 tablet 1  . NONFORMULARY OR COMPOUNDED ITEM Cmp, lipid   Dx hyperlipidemia 1 each 0  . omeprazole (PRILOSEC) 40 MG capsule TAKE 1 CAPSULE(40 MG) BY MOUTH DAILY 30 capsule 3  . OZEMPIC, 1 MG/DOSE, 4 MG/3ML SOPN INJECT 1 MG UNDER THE SKIN ONCE A WEEK 9 mL 0  . Lifitegrast (XIIDRA) 5 % SOLN Xiidra 5 % eye drops in a dropperette  INSTILL 1 DROP IN BOTH EYES TWICE DAILY 12 HOURS APART     No facility-administered medications prior to visit.    PAST MEDICAL HISTORY: Past Medical History:  Diagnosis Date  . Arthritis    left foot  . Asthma   . Bell palsy 2015  . Diabetes mellitus without complication ([place])    type 2  . Endometriosis   . GERD (gastroesophageal reflux disease)   . H/O hiatal hernia   . 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 Hypertension   . Kidney stone   . Kidney stones     PAST SURGICAL HISTORY: Past Surgical History:  Procedure Laterality Date  . FOOT ARTHRODESIS Left 09/05/2019   Procedure: Left Modified McBride, Left First and Second Tarsometatarsal Joint Arthrodesis;  Surgeon: Wylene Simmer, MD;  Location: [facility];  Service: Orthopedics;  Laterality: Left;  . laparatomy  1985  . RADIAL HEAD ARTHROPLASTY Left 02/23/2013   Procedure: LEFT RADIAL HEAD ARTHROPLASTY qwith ligament reconstruction;  Surgeon: Roseanne Kaufman, MD;  Location: [facility];  Service: Orthopedics;  Laterality: Left;  . TONSILLECTOMY AND ADENOIDECTOMY  1967  . uterine lining ablation   2002    FAMILY HISTORY: Family History  Problem Relation Age of Onset  . Diabetes Mother   . Hyperlipidemia Mother   . Hypertension Mother   . Diabetes Father   . Hyperlipidemia Father   . Hypertension Father     SOCIAL HISTORY: Social History   Socioeconomic History  . Marital status: Married    Spouse name: Not on file  . Number of children: Not on file  . Years of education: Not on file  . Highest education level: Not on file  Occupational History    Comment: unemployed  Tobacco Use  . Smoking status: Never Smoker  . Smokeless tobacco: Never Used  Substance and Sexual Activity  . Alcohol use: No  . Drug  use: No  . Sexual activity: Yes    Partners: Male  Other Topics Concern  . Not on file  Social History Narrative   Exercise-- no   Social Determinants of Health   Financial Resource Strain: Not on file  Food Insecurity: Not on file  Transportation Needs: Not on file  Physical Activity: Not on file  Stress: Not on file  Social Connections: Not on file  Intimate Partner Violence: Not on file   PHYSICAL EXAM  Vitals:   08/04/20 1004  BP: 123/74  Pulse: 93  Weight: 150 lb (68 kg)  Height: _0  (1.6 m)   Body mass index is 26.57 kg/m.  Generalized: Well developed, in no acute distress   Neurological examination  Mentation: Alert oriented to time, place, history taking. Follows all commands speech and language fluent Cranial nerve II-XII: Pupils were equal round reactive to light. Extraocular movements were full, visual field were full on confrontational test. Facial sensation and strength were normal. Head turning and shoulder shrug  were normal and symmetric. Motor: The motor testing reveals 5 over 5 strength of all 4 extremities. Good symmetric motor tone is noted throughout.  Sensory: Sensory testing is intact to soft touch on all 4 extremities. No evidence of extinction is noted.  Examination of the left foot, soft touch, pinprick, and vibration  sensation is intact, is hypersensitive to touch at surgical sites. Strong left dorsiflexion/plantar flexion. Coordination: Cerebellar testing reveals good finger-nose-finger and heel-to-shin bilaterally.  Gait and station: Gait is normal, but slight limp on the left. Tandem gait is normal. Romberg is negative. No drift is seen.  Reflexes: Deep tendon reflexes are symmetric and normal bilaterally.   DIAGNOSTIC DATA (LABS, IMAGING, TESTING) - I reviewed patient records, labs, notes, testing and imaging myself where available.  Lab Results  Component Value Date   WBC 4.9 03/24/2014   HGB 16.5 (A) 03/24/2014   HCT 48 (A) 03/24/2014   MCV 86.4 02/21/2013   PLT 166 03/24/2014      Component Value Date/Time   NA 141 02/20/2020 0813   K 4.4 02/20/2020 0813   CL 104 02/20/2020 0813   CO2 24 02/20/2020 0813   GLUCOSE 143 (H) 02/20/2020 0813   GLUCOSE 211 (H) 04/03/2013 0750   BUN 12 02/20/2020 0813   CREATININE 0.75 02/20/2020 0813   CALCIUM 9.7 02/20/2020 0813   PROT 6.9 02/20/2020 0813   ALBUMIN 4.4 02/20/2020 0813   ALBUMIN 4.6 03/30/2015 1128   AST 34 02/20/2020 0813   AST 37 03/30/2015 1128   ALT 44 (H) 02/20/2020 0813   ALT 52 03/30/2015 1128   ALKPHOS 89 02/20/2020 0813   ALKPHOS 83 03/30/2015 1128   BILITOT 0.8 02/20/2020 0813   GFRNONAA 86 02/20/2020 0813   GFRAA 99 02/20/2020 0813   Lab Results  Component Value Date   CHOL 129 02/20/2020   HDL 36 (L) 02/20/2020   LDLCALC 71 02/20/2020   TRIG 121 02/20/2020   CHOLHDL 3.2 08/09/2019   Lab Results  Component Value Date   HGBA1C 6.7 (A) 06/04/2020   No results found for: VITAMINB12 Lab Results  Component Value Date   TSH 1.56 03/24/2014    ASSESSMENT AND PLAN 60 y.o. year old female  has a past medical history of Arthritis, Asthma, Bell palsy (2015), Diabetes mellitus without complication ([place]), Endometriosis, GERD (gastroesophageal reflux disease), H/O hiatal hernia, Histoplasmosis (2002), Hypertension, Kidney  stone, and Kidney stones. here with:  1.  History of right-sided Bell's palsy  2.  Left foot pain, post-op April 2021 -Present since surgery in April 2021, seems a post-surgical issue -Offered EMG/NCV to determine if nerve issue, symptoms were not present prior to surgery, of note, does have DM, but fairly well controlled 6.7 A1C, possible neuropathy, but only in the left since surgery -Can continue gabapentin, PCP has been adjusting dosing to cover the foot pain, was previously taking 100 mg twice a day when we prescribed for Bell's palsy -She has decided to give the left foot more time to heal, has follow-up with surgeon again in May, doesn't think she could tolerate EMG/NCV at this point, encouraged her to let me know, can do new consult with Dr. Jannifer Franklin if needed  -Follow-up here in 1 year or sooner if needed  I spent 30 minutes of face-to-face and non-face-to-face time with patient.  This included previsit chart review, lab review, study review, order entry, electronic health record documentation, patient education.  Butler Denmark, AGNP-C, DNP 08/04/2020, 10:55 AM [facility] [address], [place], [place] [address] [phone]

## 2020-08-04 NOTE — Patient Instructions (Signed)
Continue to see your primary doctor See Dr. Victorino Dike for follow-up  See you back in 1 year

## 2020-08-05 ENCOUNTER — Encounter: Payer: Self-pay | Admitting: Family Medicine

## 2020-08-05 NOTE — Progress Notes (Signed)
I have read the note, and I agree with the clinical assessment and plan.  Shaela Boer K Jahna Liebert   

## 2020-10-07 ENCOUNTER — Other Ambulatory Visit: Payer: Self-pay

## 2020-10-07 ENCOUNTER — Ambulatory Visit: Payer: 59 | Admitting: Internal Medicine

## 2020-10-07 ENCOUNTER — Encounter: Payer: Self-pay | Admitting: Internal Medicine

## 2020-10-07 VITALS — BP 120/72 | HR 89 | Ht 63.0 in | Wt 150.8 lb

## 2020-10-07 DIAGNOSIS — E1165 Type 2 diabetes mellitus with hyperglycemia: Secondary | ICD-10-CM

## 2020-10-07 DIAGNOSIS — E785 Hyperlipidemia, unspecified: Secondary | ICD-10-CM | POA: Diagnosis not present

## 2020-10-07 LAB — POCT GLYCOSYLATED HEMOGLOBIN (HGB A1C): Hemoglobin A1C: 6.7 % — AB (ref 4.0–5.6)

## 2020-10-07 MED ORDER — METFORMIN HCL ER 500 MG PO TB24: ORAL_TABLET | ORAL | 3 refills | Status: DC

## 2020-10-07 MED ORDER — OZEMPIC (1 MG/DOSE) 4 MG/3ML ~~LOC~~ SOPN: PEN_INJECTOR | SUBCUTANEOUS | 3 refills | Status: DC

## 2020-10-07 NOTE — Addendum Note (Signed)
Addended by: Kenyon Ana on: 10/07/2020 03:06 PM   Modules accepted: Orders

## 2020-10-07 NOTE — Progress Notes (Signed)
Patient ID: Dominique [name], female   DOB: [birth year], 60 y.o.   MRN: [id number]   This visit occurred during the SARS-CoV-2 public health emergency.  Safety protocols were in place, including screening questions prior to the visit, additional usage of staff PPE, and extensive cleaning of exam room while observing appropriate contact time as indicated for disinfecting solutions.   HPI: Dominique [name] is a 60 y.o.-year-old female, returning for follow-up for DM2, dx in 2016, but GDM in 2000, non-insulin-dependent, uncontrolled, without long term complications.  She previously saw Dr. Loanne Drilling.  Last visit with me 4 months ago.  Interim history: No increased urination, blurry vision, nausea. She feels more tired, does not sleep well.  Increased stress at home. She saw neurology recently >> no diabetic peripheral neuropathy. She had bunion sx 08/2019 >> still has a little decreased sensation on dorsum of the foot at the surgical scar site.   Reviewed HbA1c levels: Lab Results  Component Value Date   HGBA1C 6.7 (A) 06/04/2020   HGBA1C 6.0 (H) 11/25/2019   HGBA1C 6.3 (A) 06/19/2019   HGBA1C 7.1 (A) 02/12/2019   HGBA1C 6.6 (A) 10/12/2018   HGBA1C 5.9 (A) 06/13/2018   HGBA1C 6.5 (A) 02/22/2018   HGBA1C 7.5 06/20/2017   HGBA1C 6.4 (A) 12/09/2014   HGBA1C 11.9 (A) 03/24/2014  HbA1c 8.9%  She is on: - Metformin ER 1000 mg with dinner  - added 01/2019 - Glipizide XL 5 mg before breakfast - added 07/2018 - Ozempic 0.5 >> 1 mg weekly We have to stop Jardiance 07/2018 due to persistent yeast infections. She stopped metformin due to lack of effect. She developed a rash with Trulicity She was previously on nateglinide and also on repaglinide.  Pt checks her sugars once a day: - am: 116, 125-130, 160 >> 119, 123-159 >> 121-152 - 2h after b'fast: 78, 152 >> n/c >> 135 >> n/c >> 198 >> n/c - before lunch: 97 >> 105 >> n/c - 2h after lunch: 99, 161 >> n/c >> 170 >> n/c >> 143, 200 - before  dinner: 147 >> n/c - 2h after dinner: 132-175, 199 >> n/c >> 211 >> n/c >> 205 - bedtime: n/c - nighttime: n/c Lowest sugar was 78 ... 116 >> 119 >> 121; she has hypoglycemia awareness in the 90s. Highest sugar was 200 >> 160 >> 159 >> 205.  Glucometer: Accu-Chek guide  Pt's meals are: - Breakfast: 1-2 egg, 1-2 toast; egg + bacon - Lunch: eating out, salad, meat + veggies - Dinner: eating out, salad, meat + veggies - Dessert: apple pie, cake - Snacks: chips  -No CKD, last BUN/creatinine:  Lab Results  Component Value Date   BUN 12 02/20/2020   BUN 13 08/09/2019   CREATININE 0.75 02/20/2020   CREATININE 0.79 08/09/2019  On losartan 100.  -+ HL; last set of lipids: Lab Results  Component Value Date   CHOL 129 02/20/2020   HDL 36 (L) 02/20/2020   LDLCALC 71 02/20/2020   TRIG 121 02/20/2020   CHOLHDL 3.2 08/09/2019  On Lipitor 10, fenofibrate 160, fish oil 1000 mg 2x a day.  - last eye exam was in 07/2020: No DR reportedly; Dr. Sabra Heck. Sees an ophthalmic surgeon >> contemplates palpebral surgery. On Xiidra and fish oil for dry eyes.  -No numbness and tingling in her feet  On Gabapentin for Bell's palsy. She has a history of a kidney stone in 2019.  Pt has FH of DM in mother and father.  ROS: Constitutional: no weight gain/no weight loss, + fatigue, no subjective hyperthermia, no subjective hypothermia Eyes: no blurry vision, + xerophthalmia ENT: no sore throat, no nodules palpated in neck, no dysphagia, no odynophagia, no hoarseness Cardiovascular: no CP/no SOB/no palpitations/no leg swelling Respiratory: no cough/no SOB/no wheezing Gastrointestinal: no N/no V/no D/no C/no acid reflux Musculoskeletal: no muscle aches/no joint aches Skin: no rashes, no hair loss Neurological: no tremors/+ numbness - top of L foot/no tingling/no dizziness  I reviewed pt's medications, allergies, PMH, social hx, family hx, and changes were documented in the history of present illness.  Otherwise, unchanged from my initial visit note.  Past Medical History:  Diagnosis Date  . Arthritis    left foot  . Asthma   . Bell palsy 2015  . Diabetes mellitus without complication ([place])    type 2  . Endometriosis   . GERD (gastroesophageal reflux disease)   . H/O hiatal hernia   . 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 Hypertension   . Kidney stone   . Kidney stones    Past Surgical History:  Procedure Laterality Date  . FOOT ARTHRODESIS Left 09/05/2019   Procedure: Left Modified McBride, Left First and Second Tarsometatarsal Joint Arthrodesis;  Surgeon: Wylene Simmer, MD;  Location: [facility];  Service: Orthopedics;  Laterality: Left;  . laparatomy  1985  . RADIAL HEAD ARTHROPLASTY Left 02/23/2013   Procedure: LEFT RADIAL HEAD ARTHROPLASTY qwith ligament reconstruction;  Surgeon: Roseanne Kaufman, MD;  Location: [place] [facility];  Service: Orthopedics;  Laterality: Left;  . TONSILLECTOMY AND ADENOIDECTOMY  1967  . uterine lining ablation  2002   Social History   Socioeconomic History  . Marital status: Married    Spouse name: Not on file  . Number of children: Not on file  . Years of education: Not on file  . Highest education level: Not on file  Occupational History    Comment: unemployed  Tobacco Use  . Smoking status: Never Smoker  . Smokeless tobacco: Never Used  Substance and Sexual Activity  . Alcohol use: No  . Drug use: No  . Sexual activity: Yes    Partners: Male  Other Topics Concern  . Not on file  Social History Narrative   Exercise-- no   Social Determinants of Health   Financial Resource Strain: Not on file  Food Insecurity: Not on file  Transportation Needs: Not on file  Physical Activity: Not on file  Stress: Not on file  Social Connections: Not on file  Intimate Partner Violence: Not on file   Current Outpatient Medications on File Prior to Visit  Medication Sig Dispense Refill  . albuterol (PROAIR HFA) 108 (90  Base) MCG/ACT inhaler Inhale 2 puffs into the lungs every 6 (six) hours as needed for wheezing or shortness of breath. 18 g 3  . atorvastatin (LIPITOR) 10 MG tablet TAKE 1 TABLET(10 MG) BY MOUTH EVERY OTHER DAY 45 tablet 1  . blood glucose meter kit and supplies Dispense based on patient and insurance preference. Use up to four times daily as directed. (FOR ICD-10 E10.9, E11.9). 1 each 0  . Blood Glucose Monitoring Suppl (CONTOUR NEXT ONE) KIT 1 kit by Does not apply route See admin instructions. For checking blood sugar 2 times a day 1 kit 0  . fenofibrate 160 MG tablet TAKE 1 TABLET(160 MG) BY MOUTH DAILY. 90 tablet 1  . fluticasone (FLONASE) 50 MCG/ACT nasal spray Place 2 sprays into both nostrils daily. [place]  g 3  . fluticasone (FLOVENT HFA) 110 MCG/ACT inhaler Inhale 2 puffs into the lungs 2 (two) times daily. 36 g 1  . gabapentin (NEURONTIN) 100 MG capsule 2 po bid (Patient taking differently: Taking 200 MG 3 times a day) 360 capsule 3  . glipiZIDE (GLUCOTROL XL) 5 MG 24 hr tablet TAKE 1 TABLET(5 MG) BY MOUTH DAILY WITH BREAKFAST 90 tablet 3  . glucose blood (CONTOUR NEXT TEST) test strip Use to check blood sugar 2 times a day. 200 each 12  . levalbuterol (XOPENEX) 1.25 MG/3ML nebulizer solution USE ONE VIAL BY NEBULIZATION EVERY 6 HOURS AS NEEDED FOR WHEEZING OR SHORTNESS OF BREATH 675 mL 1  . losartan (COZAAR) 100 MG tablet TAKE 1 TABLET(100 MG) BY MOUTH DAILY. 90 tablet 1  . metFORMIN (GLUCOPHAGE-XR) 500 MG 24 hr tablet TAKE 2 TABLETS BY MOUTH WITH DINNER 180 tablet 3  . montelukast (SINGULAIR) 10 MG tablet TAKE 1 TABLET(10 MG) BY MOUTH AT BEDTIME. 90 tablet 1  . NONFORMULARY OR COMPOUNDED ITEM Cmp, lipid   Dx hyperlipidemia 1 each 0  . omeprazole (PRILOSEC) 40 MG capsule TAKE 1 CAPSULE(40 MG) BY MOUTH DAILY 30 capsule 3  . OZEMPIC, 1 MG/DOSE, 4 MG/3ML SOPN INJECT 1 MG UNDER THE SKIN ONCE A WEEK 9 mL 0   No current facility-administered medications on file prior to visit.   No Known  Allergies Family History  Problem Relation Age of Onset  . Diabetes Mother   . Hyperlipidemia Mother   . Hypertension Mother   . Diabetes Father   . Hyperlipidemia Father   . Hypertension Father     PE: BP 120/72 (BP Location: Right Arm, Patient Position: Sitting, Cuff Size: Normal)   Pulse 89   Ht $R'5\' 3"'oL$  (1.6 m)   Wt 150 lb 12.8 oz (68.4 kg)   SpO2 98%   BMI 26.71 kg/m  Wt Readings from Last 3 Encounters:  10/07/20 150 lb 12.8 oz (68.4 kg)  08/04/20 150 lb (68 kg)  06/04/20 152 lb 3.2 oz (69 kg)   Constitutional: normal weight, in NAD Eyes: PERRLA, EOMI, no exophthalmos ENT: moist mucous membranes, no thyromegaly, no cervical lymphadenopathy Cardiovascular: RRR, No MRG Respiratory: CTA B Gastrointestinal: abdomen soft, NT, ND, BS+ Musculoskeletal: no deformities, strength intact in all 4 Skin: moist, warm, no rashes Neurological: no tremor with outstretched hands, DTR normal in all 4  ASSESSMENT: 1. DM2, non-insulin-dependent, uncontrolled, without long-term complications, but with hyperglycemia  2. HL  PLAN:  1. Patient with longstanding, uncontrolled, type 2 diabetes, on metformin, sulfonylurea, and also weekly GLP-1 receptor agonist.  We also had her on an SGLT2 inhibitor in the past but we had to stop this due to yeast infections.  We added glipizide after stopping Jardiance.  At last visit, sugars were at or higher than target but she was only checking in the morning and I advised her to check later in the day, also.  We did not change her regimen at that time.  HbA1c was slightly higher, at 6.7%. -At today's visit, sugars are almost all higher than target in the morning and later in the day she has blood sugars in the 200s. - we checked her HbA1c: 6.7% (stable), but this is lower than expected from her meter downloads. -Therefore, for now, I suggested to increase the dose of metformin to 2000 mg with dinner.  I advised him to split the dose if she cannot tolerate the  entire dose with dinner. - I suggested to:  Patient Instructions  Please increase: - Metformin ER to 2000 mg with dinner (can split the dose in 2 if needed)  Continue: - Glipizide XL 5 mg before b'fast - Ozempic 1 mg weekly  Check sugars 1x a day, rotating check times.  Please come back for a follow-up appointment in 4 months.  - advised to check sugars at different times of the day - 1x a day, rotating check times - advised for yearly eye exams >> she is UTD - return to clinic in 4 months  2. HL -Reviewed latest lipid panel from 02/2020: Fractions at goal with exception of a slightly low HDL Lab Results  Component Value Date   CHOL 129 02/20/2020   HDL 36 (L) 02/20/2020   LDLCALC 71 02/20/2020   TRIG 121 02/20/2020   CHOLHDL 3.2 08/09/2019  -Continues Lipitor 10, fenofibrate 160 without side effects.  She also added fish oil 1000 mg twice daily for dry eyes.  LABS NEED TO GO TO [facility] -patient's husband works there.  Philemon Kingdom, MD PhD [facility] Endocrinology

## 2020-10-07 NOTE — Patient Instructions (Signed)
Please increase: - Metformin ER to 2000 mg with dinner (can split the dose in 2 if needed)  Continue: - Glipizide XL 5 mg before b'fast - Ozempic 1 mg weekly  Check sugars 1x a day, rotating check times.  Please come back for a follow-up appointment in 4 months.

## 2020-10-24 ENCOUNTER — Other Ambulatory Visit: Payer: Self-pay | Admitting: Family Medicine

## 2020-10-24 DIAGNOSIS — E785 Hyperlipidemia, unspecified: Secondary | ICD-10-CM

## 2020-10-29 ENCOUNTER — Encounter: Payer: Self-pay | Admitting: Internal Medicine

## 2020-11-05 ENCOUNTER — Encounter: Payer: Self-pay | Admitting: Internal Medicine

## 2020-11-10 ENCOUNTER — Other Ambulatory Visit: Payer: Self-pay | Admitting: Family Medicine

## 2020-11-10 DIAGNOSIS — I10 Essential (primary) hypertension: Secondary | ICD-10-CM

## 2020-11-11 ENCOUNTER — Encounter: Payer: Self-pay | Admitting: Family Medicine

## 2020-11-12 ENCOUNTER — Other Ambulatory Visit: Payer: Self-pay | Admitting: Internal Medicine

## 2020-11-13 ENCOUNTER — Ambulatory Visit: Payer: 59 | Admitting: Family Medicine

## 2020-11-13 ENCOUNTER — Encounter: Payer: Self-pay | Admitting: Family Medicine

## 2020-11-13 ENCOUNTER — Other Ambulatory Visit: Payer: Self-pay

## 2020-11-13 ENCOUNTER — Encounter: Payer: Self-pay | Admitting: *Deleted

## 2020-11-13 VITALS — BP 130/80 | HR 92 | Temp 98.5°F | Resp 18 | Ht 63.0 in | Wt 148.6 lb

## 2020-11-13 DIAGNOSIS — J4520 Mild intermittent asthma, uncomplicated: Secondary | ICD-10-CM

## 2020-11-13 DIAGNOSIS — M792 Neuralgia and neuritis, unspecified: Secondary | ICD-10-CM

## 2020-11-13 DIAGNOSIS — J302 Other seasonal allergic rhinitis: Secondary | ICD-10-CM

## 2020-11-13 DIAGNOSIS — E1165 Type 2 diabetes mellitus with hyperglycemia: Secondary | ICD-10-CM

## 2020-11-13 DIAGNOSIS — K219 Gastro-esophageal reflux disease without esophagitis: Secondary | ICD-10-CM | POA: Diagnosis not present

## 2020-11-13 DIAGNOSIS — I10 Essential (primary) hypertension: Secondary | ICD-10-CM | POA: Diagnosis not present

## 2020-11-13 DIAGNOSIS — Z23 Encounter for immunization: Secondary | ICD-10-CM | POA: Diagnosis not present

## 2020-11-13 DIAGNOSIS — J45909 Unspecified asthma, uncomplicated: Secondary | ICD-10-CM

## 2020-11-13 DIAGNOSIS — Z1211 Encounter for screening for malignant neoplasm of colon: Secondary | ICD-10-CM

## 2020-11-13 DIAGNOSIS — E785 Hyperlipidemia, unspecified: Secondary | ICD-10-CM | POA: Diagnosis not present

## 2020-11-13 MED ORDER — LOSARTAN POTASSIUM 100 MG PO TABS: ORAL_TABLET | ORAL | 1 refills | Status: DC

## 2020-11-13 MED ORDER — LEVALBUTEROL HCL 1.25 MG/3ML IN NEBU: INHALATION_SOLUTION | RESPIRATORY_TRACT | 1 refills | Status: DC

## 2020-11-13 MED ORDER — MONTELUKAST SODIUM 10 MG PO TABS: ORAL_TABLET | ORAL | 1 refills | Status: DC

## 2020-11-13 MED ORDER — FENOFIBRATE 160 MG PO TABS: ORAL_TABLET | ORAL | 1 refills | Status: DC

## 2020-11-13 MED ORDER — NONFORMULARY OR COMPOUNDED ITEM: 0 refills | Status: AC

## 2020-11-13 MED ORDER — OMEPRAZOLE 40 MG PO CPDR: DELAYED_RELEASE_CAPSULE | ORAL | 3 refills | Status: DC

## 2020-11-13 MED ORDER — FLUTICASONE PROPIONATE HFA 110 MCG/ACT IN AERO: 2.0000 | INHALATION_SPRAY | Freq: Two times a day (BID) | RESPIRATORY_TRACT | 1 refills | Status: DC

## 2020-11-13 MED ORDER — FLUTICASONE PROPIONATE 50 MCG/ACT NA SUSP: 2.0000 | Freq: Every day | NASAL | 3 refills | Status: DC

## 2020-11-13 NOTE — Patient Instructions (Signed)
https://www.nhlbi.nih.gov/files/docs/public/heart/dash_brief.pdf">  DASH Eating Plan DASH stands for Dietary Approaches to Stop Hypertension. The DASH eating plan is a healthy eating plan that has been shown to: Reduce high blood pressure (hypertension). Reduce your risk for type 2 diabetes, heart disease, and stroke. Help with weight loss. What are tips for following this plan? Reading food labels Check food labels for the amount of salt (sodium) per serving. Choose foods with less than 5 percent of the Daily Value of sodium. Generally, foods with less than 300 milligrams (mg) of sodium per serving fit into this eating plan. To find whole grains, look for the word "whole" as the first word in the ingredient list. Shopping Buy products labeled as "low-sodium" or "no salt added." Buy fresh foods. Avoid canned foods and pre-made or frozen meals. Cooking Avoid adding salt when cooking. Use salt-free seasonings or herbs instead of table salt or sea salt. Check with your health care provider or pharmacist before using salt substitutes. Do not fry foods. Cook foods using healthy methods such as baking, boiling, grilling, roasting, and broiling instead. Cook with heart-healthy oils, such as olive, canola, avocado, soybean, or sunflower oil. Meal planning  Eat a balanced diet that includes: 4 or more servings of fruits and 4 or more servings of vegetables each day. Try to fill one-half of your plate with fruits and vegetables. 6-8 servings of whole grains each day. Less than 6 oz (170 g) of lean meat, poultry, or fish each day. A 3-oz (85-g) serving of meat is about the same size as a deck of cards. One egg equals 1 oz (28 g). 2-3 servings of low-fat dairy each day. One serving is 1 cup (237 mL). 1 serving of nuts, seeds, or beans 5 times each week. 2-3 servings of heart-healthy fats. Healthy fats called omega-3 fatty acids are found in foods such as walnuts, flaxseeds, fortified milks, and eggs.  These fats are also found in cold-water fish, such as sardines, salmon, and mackerel. Limit how much you eat of: Canned or prepackaged foods. Food that is high in trans fat, such as some fried foods. Food that is high in saturated fat, such as fatty meat. Desserts and other sweets, sugary drinks, and other foods with added sugar. Full-fat dairy products. Do not salt foods before eating. Do not eat more than 4 egg yolks a week. Try to eat at least 2 vegetarian meals a week. Eat more home-cooked food and less restaurant, buffet, and fast food.  Lifestyle When eating at a restaurant, ask that your food be prepared with less salt or no salt, if possible. If you drink alcohol: Limit how much you use to: 0-1 drink a day for women who are not pregnant. 0-2 drinks a day for men. Be aware of how much alcohol is in your drink. In the [place] one drink equals one 12 oz bottle of beer (355 mL), one 5 oz glass of wine (148 mL), or one 1 oz glass of hard liquor (44 mL). General information Avoid eating more than 2,300 mg of salt a day. If you have hypertension, you may need to reduce your sodium intake to 1,500 mg a day. Work with your health care provider to maintain a healthy body weight or to lose weight. Ask what an ideal weight is for you. Get at least 30 minutes of exercise that causes your heart to beat faster (aerobic exercise) most days of the week. Activities may include walking, swimming, or biking. Work with your health care provider   or dietitian to adjust your eating plan to your individual calorie needs. What foods should I eat? Fruits All fresh, dried, or frozen fruit. Canned fruit in natural juice (without addedsugar). Vegetables Fresh or frozen vegetables (raw, steamed, roasted, or grilled). Low-sodium or reduced-sodium tomato and vegetable juice. Low-sodium or reduced-sodium tomatosauce and tomato paste. Low-sodium or reduced-sodium canned vegetables. Grains Whole-grain or  whole-wheat bread. Whole-grain or whole-wheat pasta. Brown rice. Oatmeal. Quinoa. Bulgur. Whole-grain and low-sodium cereals. Pita bread.Low-fat, low-sodium crackers. Whole-wheat flour tortillas. Meats and other proteins Skinless chicken or turkey. Ground chicken or turkey. Pork with fat trimmed off. Fish and seafood. Egg whites. Dried beans, peas, or lentils. Unsalted nuts, nut butters, and seeds. Unsalted canned beans. Lean cuts of beef with fat trimmed off. Low-sodium, lean precooked or cured meat, such as sausages or meatloaves. Dairy Low-fat (1%) or fat-free (skim) milk. Reduced-fat, low-fat, or fat-free cheeses. Nonfat, low-sodium ricotta or cottage cheese. Low-fat or nonfatyogurt. Low-fat, low-sodium cheese. Fats and oils Soft margarine without trans fats. Vegetable oil. Reduced-fat, low-fat, or light mayonnaise and salad dressings (reduced-sodium). Canola, safflower, olive, avocado, soybean, andsunflower oils. Avocado. Seasonings and condiments Herbs. Spices. Seasoning mixes without salt. Other foods Unsalted popcorn and pretzels. Fat-free sweets. The items listed above may not be a complete list of foods and beverages you can eat. Contact a dietitian for more information. What foods should I avoid? Fruits Canned fruit in a light or heavy syrup. Fried fruit. Fruit in cream or buttersauce. Vegetables Creamed or fried vegetables. Vegetables in a cheese sauce. Regular canned vegetables (not low-sodium or reduced-sodium). Regular canned tomato sauce and paste (not low-sodium or reduced-sodium). Regular tomato and vegetable juice(not low-sodium or reduced-sodium). Pickles. Olives. Grains Baked goods made with fat, such as croissants, muffins, or some breads. Drypasta or rice meal packs. Meats and other proteins Fatty cuts of meat. Ribs. Fried meat. Bacon. Bologna, salami, and other precooked or cured meats, such as sausages or meat loaves. Fat from the back of a pig (fatback). Bratwurst.  Salted nuts and seeds. Canned beans with added salt. Canned orsmoked fish. Whole eggs or egg yolks. Chicken or turkey with skin. Dairy Whole or 2% milk, cream, and half-and-half. Whole or full-fat cream cheese. Whole-fat or sweetened yogurt. Full-fat cheese. Nondairy creamers. Whippedtoppings. Processed cheese and cheese spreads. Fats and oils Butter. Stick margarine. Lard. Shortening. Ghee. Bacon fat. Tropical oils, suchas coconut, palm kernel, or palm oil. Seasonings and condiments Onion salt, garlic salt, seasoned salt, table salt, and sea salt. Worcestershire sauce. Tartar sauce. Barbecue sauce. Teriyaki sauce. Soy sauce, including reduced-sodium. Steak sauce. Canned and packaged gravies. Fish sauce. Oyster sauce. Cocktail sauce. Store-bought horseradish. Ketchup. Mustard. Meat flavorings and tenderizers. Bouillon cubes. Hot sauces. Pre-made or packaged marinades. Pre-made or packaged taco seasonings. Relishes. Regular saladdressings. Other foods Salted popcorn and pretzels. The items listed above may not be a complete list of foods and beverages you should avoid. Contact a dietitian for more information. Where to find more information National Heart, Lung, and Blood Institute: www.nhlbi.nih.gov American Heart Association: www.heart.org Academy of Nutrition and Dietetics: www.eatright.org National Kidney Foundation: www.kidney.org Summary The DASH eating plan is a healthy eating plan that has been shown to reduce high blood pressure (hypertension). It may also reduce your risk for type 2 diabetes, heart disease, and stroke. When on the DASH eating plan, aim to eat more fresh fruits and vegetables, whole grains, lean proteins, low-fat dairy, and heart-healthy fats. With the DASH eating plan, you should limit salt (sodium) intake to 2,300   mg a day. If you have hypertension, you may need to reduce your sodium intake to 1,500 mg a day. Work with your health care provider or dietitian to adjust  your eating plan to your individual calorie needs. This information is not intended to replace advice given to you by your health care provider. Make sure you discuss any questions you have with your healthcare provider. Document Revised: 04/05/2019 Document Reviewed: 04/05/2019 Elsevier Patient Education  2022 Elsevier Inc.  

## 2020-11-13 NOTE — Assessment & Plan Note (Signed)
Per neuro 

## 2020-11-13 NOTE — Assessment & Plan Note (Signed)
con't inhalers

## 2020-11-13 NOTE — Assessment & Plan Note (Signed)
Per endo °

## 2020-11-13 NOTE — Progress Notes (Signed)
Patient ID: Dominique [name], female    DOB: December 07, 1960  Age: 60 y.o. MRN: [id number]    Subjective:  Subjective  HPI ADYN HOES presents for an office visit and medication management today. She reports feeling well and has no complaints. She notes that she no longer takes 500 mg metformin PO Daily for T2DM since she is unable to tolerate the dosage. She endorses taking 5 mg of glipizide PO Daily for her dx of T2DM. She states that her blood sugar level for the past year and a half is around 6.4-6.7.  Lab Results  Component Value Date   HGBA1C 6.7 (A) 10/07/2020  She notes that she no longer takes 300 mg gabapentin BID Daily for her dx of R-sided Bell's palsy. She states that she has neuritis and her L top of her foot is extra sensitive. Pt has surgery on her L foot on 09/05/2019 and she is recovering well. She endorses using Albuterol inhaler as needed. She states that she has been under a lot of stress. She denies any chest pain, SOB, fever, abdominal pain, cough, chills, sore throat, dysuria, urinary incontinence, back pain, HA, or N/V/D at this time. Pt doesn't wished to receive a colonoscopy due to her being scared of it, however she agreed to cologuard test. Pt is UTD with neuro and optometrist. Pt agrees to tetanus vaccine today.    Review of Systems  Constitutional:  Negative for chills, fatigue and fever.  HENT:  Negative for ear pain, rhinorrhea, sinus pressure, sinus pain, sore throat and tinnitus.   Eyes:  Negative for pain.  Respiratory:  Negative for cough, shortness of breath and wheezing.   Cardiovascular:  Negative for chest pain.  Gastrointestinal:  Negative for abdominal pain, anal bleeding, constipation, diarrhea, nausea and vomiting.  Genitourinary:  Negative for flank pain.  Musculoskeletal:  Negative for back pain and neck pain.  Skin:  Negative for rash.  Neurological:  Negative for seizures, weakness, light-headedness, numbness and headaches.    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   Her family history includes Diabetes in her father and mother; Hyperlipidemia in her father and mother; Hypertension in her father and mother.She reports that she has never smoked. She has never used smokeless tobacco. She reports that she does not drink alcohol and does not use drugs.  Current Outpatient Medications on File Prior to Visit  Medication Sig Dispense Refill   albuterol (PROAIR HFA) 108 (90 Base) MCG/ACT inhaler Inhale 2 puffs into the lungs every 6 (six) hours as needed for wheezing or shortness of breath. 18 g 3   atorvastatin (LIPITOR) 10 MG tablet TAKE 1 TABLET(10 MG) BY MOUTH EVERY OTHER DAY 45 tablet 1   blood glucose meter kit and supplies Dispense based on patient and insurance preference. Use up to four times daily as directed. (FOR ICD-10 E10.9, E11.9). 1 each 0   Blood Glucose Monitoring  Suppl (CONTOUR NEXT ONE) KIT 1 kit by Does not apply route See admin instructions. For checking blood sugar 2 times a day 1 kit 0   glipiZIDE (GLUCOTROL XL) 5 MG 24 hr tablet TAKE 1 TABLET(5 MG) BY MOUTH DAILY WITH BREAKFAST 90 tablet 3   glucose blood (CONTOUR NEXT TEST) test strip Use to check blood sugar 2 times a day. 200 each 12   NONFORMULARY OR COMPOUNDED ITEM Cmp, lipid   Dx hyperlipidemia 1 each 0   Semaglutide, 1 MG/DOSE, (OZEMPIC, 1 MG/DOSE,) 4 MG/3ML SOPN INJECT 1 MG UNDER THE SKIN ONCE A WEEK 9 mL 3    gabapentin (NEURONTIN) 100 MG capsule 2 po bid (Patient not taking: Reported on 11/13/2020) 360 capsule 3   No current facility-administered medications on file prior to visit.     Objective:  Objective  Physical Exam Vitals and nursing note reviewed.  Constitutional:      General: She is not in acute distress.    Appearance: Normal appearance. She is well-developed. She is not ill-appearing.  HENT:     Head: Normocephalic and atraumatic.     Right Ear: External ear normal.     Left Ear: External ear normal.     Nose: Nose normal.  Eyes:     General:        Right eye: No discharge.        Left eye: No discharge.     Extraocular Movements: Extraocular movements intact.     Pupils: Pupils are equal, round, and reactive to light.  Cardiovascular:     Rate and Rhythm: Normal rate and regular rhythm.     Pulses: Normal pulses.     Heart sounds: Normal heart sounds. No murmur heard.   No friction rub. No gallop.  Pulmonary:     Effort: Pulmonary effort is normal. No respiratory distress.     Breath sounds: Normal breath sounds. No stridor. No wheezing, rhonchi or rales.  Chest:     Chest wall: No tenderness.  Abdominal:     General: Bowel sounds are normal. There is no distension.     Palpations: Abdomen is soft. There is no mass.     Tenderness: There is no abdominal tenderness. There is no guarding or rebound.     Hernia: No hernia is present.  Musculoskeletal:        General: Normal range of motion.     Cervical back: Normal range of motion and neck supple.     Right lower leg: No edema.     Left lower leg: No edema.  Skin:    General: Skin is warm and dry.  Neurological:     Mental Status: She is alert and oriented to person, place, and time.  Psychiatric:        Behavior: Behavior normal.        Thought Content: Thought content normal.   BP 130/80 (BP Location: Right Arm, Patient Position: Sitting, Cuff Size: Normal)   Pulse 92   Temp 98.5 F (36.9 C) (Oral)   Resp  18   Ht $R'5\' 3"'Ly$  (1.6 m)   Wt 148 lb 9.6 oz (67.4 kg)   SpO2 98%   BMI 26.32 kg/m  Wt Readings from Last 3 Encounters:  11/13/20 148 lb 9.6 oz (67.4 kg)  10/07/20 150 lb 12.8 oz (68.4 kg)  08/04/20 150 lb (68 kg)     Lab Results  Component Value Date   WBC 4.9 03/24/2014   HGB 16.5 (  A) 03/24/2014   HCT 48 (A) 03/24/2014   PLT 166 03/24/2014   GLUCOSE 143 (H) 02/20/2020   CHOL 129 02/20/2020   TRIG 121 02/20/2020   HDL 36 (L) 02/20/2020   LDLCALC 71 02/20/2020   ALT 44 (H) 02/20/2020   AST 34 02/20/2020   NA 141 02/20/2020   K 4.4 02/20/2020   CL 104 02/20/2020   CREATININE 0.75 02/20/2020   BUN 12 02/20/2020   CO2 24 02/20/2020   TSH 1.56 03/24/2014   HGBA1C 6.7 (A) 10/07/2020    No results found.   Assessment & Plan:  Plan   Meds ordered this encounter  Medications   fluticasone (FLOVENT HFA) 110 MCG/ACT inhaler    Sig: Inhale 2 puffs into the lungs 2 (two) times daily.    Dispense:  36 g    Refill:  1   fenofibrate 160 MG tablet    Sig: TAKE 1 TABLET(160 MG) BY MOUTH DAILY.    Dispense:  90 tablet    Refill:  1   omeprazole (PRILOSEC) 40 MG capsule    Sig: TAKE 1 CAPSULE(40 MG) BY MOUTH DAILY    Dispense:  90 capsule    Refill:  3    ZERO refills remain on this prescription. Your patient is requesting advance approval of refills for this medication to PREVENT ANY MISSED DOSES   losartan (COZAAR) 100 MG tablet    Sig: TAKE 1 TABLET(100 MG) BY MOUTH DAILY.    Dispense:  90 tablet    Refill:  1   levalbuterol (XOPENEX) 1.25 MG/3ML nebulizer solution    Sig: USE ONE VIAL BY NEBULIZATION EVERY 6 HOURS AS NEEDED FOR WHEEZING OR SHORTNESS OF BREATH    Dispense:  675 mL    Refill:  1    **Patient requests 90 days supply**   fluticasone (FLONASE) 50 MCG/ACT nasal spray    Sig: Place 2 sprays into both nostrils daily.    Dispense:  48 g    Refill:  3   montelukast (SINGULAIR) 10 MG tablet    Sig: TAKE 1 TABLET(10 MG) BY MOUTH AT BEDTIME.    Dispense:  90  tablet    Refill:  1   NONFORMULARY OR COMPOUNDED ITEM    Sig: Cmp, lipid--- dx htn and hyperlipidemia Microalbumin---  dx DM II    Dispense:  1 each    Refill:  0   NONFORMULARY OR COMPOUNDED ITEM    Sig: Hep C antibody--- need for hep c screen    Dispense:  1 each    Refill:  0    Problem List Items Addressed This Visit       Unprioritized   GERD   Relevant Medications   omeprazole (PRILOSEC) 40 MG capsule   Essential hypertension    Well controlled, no changes to meds. Encouraged heart healthy diet such as the DASH diet and exercise as tolerated.        Relevant Medications   fenofibrate 160 MG tablet   losartan (COZAAR) 100 MG tablet   Other Relevant Orders   Comprehensive metabolic panel   Hyperlipidemia    Encourage heart healthy diet such as MIND or DASH diet, increase exercise, avoid trans fats, simple carbohydrates and processed foods, consider a krill or fish or flaxseed oil cap daily.        Relevant Medications   fenofibrate 160 MG tablet   losartan (COZAAR) 100 MG tablet   Other Relevant Orders   Lipid panel  Mild intermittent asthma without complication    con't inhalers        Relevant Medications   fluticasone (FLOVENT HFA) 110 MCG/ACT inhaler   levalbuterol (XOPENEX) 1.25 MG/3ML nebulizer solution   montelukast (SINGULAIR) 10 MG tablet   Neuritis    Per neuro       Type 2 diabetes mellitus with hyperglycemia, without long-term current use of insulin (HCC)    Per endo       Relevant Medications   losartan (COZAAR) 100 MG tablet   Other Visit Diagnoses     Need for tetanus booster    -  Primary   Relevant Orders   Tdap vaccine greater than or equal to 7yo IM (Completed)   Seasonal allergies       Relevant Medications   fluticasone (FLOVENT HFA) 110 MCG/ACT inhaler   fluticasone (FLONASE) 50 MCG/ACT nasal spray   montelukast (SINGULAIR) 10 MG tablet   Asthma, chronic, unspecified asthma severity, uncomplicated       Relevant  Medications   fluticasone (FLOVENT HFA) 110 MCG/ACT inhaler   levalbuterol (XOPENEX) 1.25 MG/3ML nebulizer solution   montelukast (SINGULAIR) 10 MG tablet   Screening for colon cancer       Relevant Orders   Cologuard       Follow-up: Return in about 6 months (around 05/16/2021), or if symptoms worsen or fail to improve, for annual exam, fasting.   I,Gordon Zheng,acting as a Education administrator for [employer], DO.,have documented all relevant documentation on the behalf of Ann Held, DO,as directed by  Ann Held, DO while in the presence of [place], DO, have reviewed all documentation for this visit. The documentation on 11/13/20 for the exam, diagnosis, procedures, and orders are all accurate and complete.

## 2020-11-13 NOTE — Assessment & Plan Note (Signed)
Well controlled, no changes to meds. Encouraged heart healthy diet such as the DASH diet and exercise as tolerated.  °

## 2020-11-13 NOTE — Assessment & Plan Note (Signed)
Encourage heart healthy diet such as MIND or DASH diet, increase exercise, avoid trans fats, simple carbohydrates and processed foods, consider a krill or fish or flaxseed oil cap daily.  °

## 2020-11-20 ENCOUNTER — Encounter: Payer: Self-pay | Admitting: Family Medicine

## 2020-11-20 ENCOUNTER — Encounter: Payer: Self-pay | Admitting: Internal Medicine

## 2020-11-20 ENCOUNTER — Other Ambulatory Visit: Payer: Self-pay | Admitting: Family Medicine

## 2020-11-20 DIAGNOSIS — R7989 Other specified abnormal findings of blood chemistry: Secondary | ICD-10-CM

## 2020-11-20 LAB — COMPREHENSIVE METABOLIC PANEL
ALT: 66 IU/L — ABNORMAL HIGH (ref 0–32)
AST: 46 IU/L — ABNORMAL HIGH (ref 0–40)
Albumin/Globulin Ratio: 2.3 — ABNORMAL HIGH (ref 1.2–2.2)
Albumin: 4.8 g/dL (ref 3.8–4.8)
Alkaline Phosphatase: 90 IU/L (ref 44–121)
BUN/Creatinine Ratio: 12 (ref 12–28)
BUN: 10 mg/dL (ref 8–27)
Bilirubin Total: 0.9 mg/dL (ref 0.0–1.2)
CO2: 24 mmol/L (ref 20–29)
Calcium: 10.2 mg/dL (ref 8.7–10.3)
Chloride: 102 mmol/L (ref 96–106)
Creatinine, Ser: 0.82 mg/dL (ref 0.57–1.00)
Globulin, Total: 2.1 g/dL (ref 1.5–4.5)
Glucose: 153 mg/dL — ABNORMAL HIGH (ref 65–99)
Potassium: 4.8 mmol/L (ref 3.5–5.2)
Sodium: 141 mmol/L (ref 134–144)
Total Protein: 6.9 g/dL (ref 6.0–8.5)
eGFR: 81 mL/min/{1.73_m2} (ref >59)

## 2020-11-20 LAB — LIPID PANEL
Chol/HDL Ratio: 3.1 ratio (ref 0.0–4.4)
Cholesterol, Total: 129 mg/dL (ref 100–199)
HDL: 42 mg/dL (ref >39)
LDL Chol Calc (NIH): 66 mg/dL (ref 0–99)
Triglycerides: 115 mg/dL (ref 0–149)
VLDL Cholesterol Cal: 21 mg/dL (ref 5–40)

## 2020-11-20 MED ORDER — EZETIMIBE 10 MG PO TABS: 10.0000 mg | ORAL_TABLET | Freq: Every day | ORAL | 3 refills | Status: DC

## 2020-11-20 NOTE — Addendum Note (Signed)
Addended by: Maximino Sarin on: 11/20/2020 02:57 PM   Modules accepted: Orders

## 2020-11-24 ENCOUNTER — Encounter: Payer: Self-pay | Admitting: Internal Medicine

## 2020-11-26 ENCOUNTER — Other Ambulatory Visit: Payer: Self-pay | Admitting: Internal Medicine

## 2020-11-26 MED ORDER — OZEMPIC (2 MG/DOSE) 8 MG/3ML ~~LOC~~ SOPN: 2.0000 mg | PEN_INJECTOR | SUBCUTANEOUS | 3 refills | Status: DC

## 2020-12-05 ENCOUNTER — Other Ambulatory Visit: Payer: Self-pay | Admitting: Internal Medicine

## 2020-12-05 LAB — COMPREHENSIVE METABOLIC PANEL
ALT: 49 IU/L — ABNORMAL HIGH (ref 0–32)
AST: 41 IU/L — ABNORMAL HIGH (ref 0–40)
Albumin/Globulin Ratio: 2.3 — ABNORMAL HIGH (ref 1.2–2.2)
Albumin: 4.8 g/dL (ref 3.8–4.8)
Alkaline Phosphatase: 82 IU/L (ref 44–121)
BUN/Creatinine Ratio: 14 (ref 12–28)
BUN: 11 mg/dL (ref 8–27)
Bilirubin Total: 1 mg/dL (ref 0.0–1.2)
CO2: 23 mmol/L (ref 20–29)
Calcium: 9.8 mg/dL (ref 8.7–10.3)
Chloride: 105 mmol/L (ref 96–106)
Creatinine, Ser: 0.78 mg/dL (ref 0.57–1.00)
Globulin, Total: 2.1 g/dL (ref 1.5–4.5)
Glucose: 146 mg/dL — ABNORMAL HIGH (ref 65–99)
Potassium: 4.9 mmol/L (ref 3.5–5.2)
Sodium: 142 mmol/L (ref 134–144)
Total Protein: 6.9 g/dL (ref 6.0–8.5)
eGFR: 86 mL/min/{1.73_m2} (ref >59)

## 2020-12-06 ENCOUNTER — Other Ambulatory Visit: Payer: Self-pay | Admitting: Family Medicine

## 2020-12-06 DIAGNOSIS — E785 Hyperlipidemia, unspecified: Secondary | ICD-10-CM

## 2020-12-07 ENCOUNTER — Encounter: Payer: Self-pay | Admitting: Internal Medicine

## 2020-12-15 ENCOUNTER — Ambulatory Visit
Admission: EM | Admit: 2020-12-15 | Discharge: 2020-12-15 | Disposition: A | Discharge status: Home / Self Care | Payer: 59

## 2020-12-15 ENCOUNTER — Other Ambulatory Visit: Payer: Self-pay

## 2020-12-15 DIAGNOSIS — H9202 Otalgia, left ear: Secondary | ICD-10-CM

## 2020-12-15 DIAGNOSIS — R519 Headache, unspecified: Secondary | ICD-10-CM

## 2020-12-15 MED ORDER — AZELASTINE HCL 0.1 % NA SOLN: 1.0000 | Freq: Two times a day (BID) | NASAL | 0 refills | Status: DC

## 2020-12-15 MED ORDER — IBUPROFEN 800 MG PO TABS: 800.0000 mg | ORAL_TABLET | Freq: Three times a day (TID) | ORAL | 0 refills | Status: DC

## 2020-12-15 NOTE — ED Provider Notes (Signed)
[facility]    CSN: [id number] Arrival date & time: 12/15/20  1425      History   Chief Complaint Chief Complaint  Patient presents with   Otalgia    HPI Dominique [name] is a 60 y.o. female presenting today for left ear pain.  Reports headache for the past few days.  Over the past 1 days she has developed pain in her left ear.  Slight postnasal drainage noted today.  Denies significant sinus congestion or drainage.  Denies fevers.  Denies associated vision changes, photophobia, nausea or vomiting with headaches.  HPI  Past Medical History:  Diagnosis Date   Arthritis    left foot   Asthma    Bell palsy 2015   Diabetes mellitus without complication ([place])    type 2   Endometriosis    GERD (gastroesophageal reflux disease)    H/O hiatal hernia    Histoplasmosis 2002   history of, [place] raised   Hypertension    Kidney stone    Kidney stones     Patient Active Problem List   Diagnosis Date Noted   Neuritis 02/14/2020   Mild intermittent asthma without complication 27/78/2423   Dermatitis 11/08/2019   Type 2 diabetes mellitus with hyperglycemia, without long-term current use of insulin ([place]) 02/22/2018   BPV (benign positional vertigo) 01/04/2016   Asthma with exacerbation 09/22/2014   Acute bronchitis 04/24/2014   Hyperlipidemia 03/27/2014   Right-sided Bell's palsy 04/05/2013   ALLERGIC RHINITIS 07/08/2010   GERD 07/08/2010   WEIGHT GAIN 01/13/2010   KERATOSIS 10/09/2008   BURSITIS, RIGHT SHOULDER 10/09/2008   HOARSENESS, CHRONIC 03/24/2008   OTITIS MEDIA, SEROUS, CHRONIC, RIGHT 03/26/2007   Essential hypertension 03/14/2007    Past Surgical History:  Procedure Laterality Date   FOOT ARTHRODESIS Left 09/05/2019   Procedure: Left Modified [place], Left First and Second Tarsometatarsal Joint Arthrodesis;  Surgeon: Wylene Simmer, MD;  Location: [facility];  Service: Orthopedics;  Laterality: Left;   laparatomy  1985   RADIAL HEAD  ARTHROPLASTY Left 02/23/2013   Procedure: LEFT RADIAL HEAD ARTHROPLASTY qwith ligament reconstruction;  Surgeon: Roseanne Kaufman, MD;  Location: [facility];  Service: Orthopedics;  Laterality: Left;   TONSILLECTOMY AND ADENOIDECTOMY  1967   uterine lining ablation  2002    OB History   No obstetric history on file.      Home Medications    Prior to Admission medications   Medication Sig Start Date End Date Taking? Authorizing Provider  azelastine (ASTELIN) 0.1 % nasal spray Place 1-2 sprays into both nostrils 2 (two) times daily. Use in each nostril as directed 12/15/20  Yes Jakyah Bradby C, PA-C  ibuprofen (ADVIL) 800 MG tablet Take 1 tablet (800 mg total) by mouth 3 (three) times daily. 12/15/20  Yes Sebrena Engh C, PA-C  Omega-3 Fatty Acids (FISH OIL) 1000 MG CAPS Take by mouth.   Yes [provider]  albuterol (PROAIR HFA) 108 (90 Base) MCG/ACT inhaler Inhale 2 puffs into the lungs every 6 (six) hours as needed for wheezing or shortness of breath. 03/27/20   Roma Schanz R, DO  atorvastatin (LIPITOR) 10 MG tablet TAKE 1 TABLET(10 MG) BY MOUTH EVERY OTHER DAY Patient not taking: Reported on 12/15/2020 10/26/20   Roma Schanz R, DO  blood glucose meter kit and supplies Dispense based on patient and insurance preference. Use up to four times daily as directed. (FOR ICD-10 E10.9, E11.9). 08/06/19   Ann Held, DO  Blood Glucose Monitoring Suppl (CONTOUR NEXT ONE) KIT 1 kit by Does not apply route See admin instructions. For checking blood sugar 2 times a day 08/15/19   Philemon Kingdom, MD  ezetimibe (ZETIA) 10 MG tablet Take 1 tablet (10 mg total) by mouth daily. 11/20/20   Carollee Herter, Yvonne R, DO  fenofibrate 160 MG tablet TAKE 1 TABLET(160 MG) BY MOUTH DAILY. 11/13/20   Roma Schanz R, DO  fluticasone (FLONASE) 50 MCG/ACT nasal spray Place 2 sprays into both nostrils daily. 11/13/20   Roma Schanz R, DO  fluticasone (FLOVENT HFA) 110 MCG/ACT inhaler  Inhale 2 puffs into the lungs 2 (two) times daily. 11/13/20   Ann Held, DO  gabapentin (NEURONTIN) 100 MG capsule 2 po bid Patient not taking: Reported on 11/13/2020 02/14/20   Carollee Herter, Alferd Apa, DO  glipiZIDE (GLUCOTROL XL) 5 MG 24 hr tablet TAKE 1 TABLET(5 MG) BY MOUTH DAILY WITH BREAKFAST 07/20/20   Philemon Kingdom, MD  glucose blood (CONTOUR NEXT TEST) test strip Use to check blood sugar 2 times a day. 06/04/20   Philemon Kingdom, MD  levalbuterol (XOPENEX) 1.25 MG/3ML nebulizer solution USE ONE VIAL BY NEBULIZATION EVERY 6 HOURS AS NEEDED FOR WHEEZING OR SHORTNESS OF BREATH 11/13/20   Carollee Herter, Yvonne R, DO  losartan (COZAAR) 100 MG tablet TAKE 1 TABLET(100 MG) BY MOUTH DAILY. 11/13/20   Ann Held, DO  Microlet Lancets [place] TEST BLOOD SUGAR TWICE DAILY 12/07/20   Philemon Kingdom, MD  montelukast (SINGULAIR) 10 MG tablet TAKE 1 TABLET(10 MG) BY MOUTH AT BEDTIME. 11/13/20   Carollee Herter, Kendrick Fries R, DO  NONFORMULARY OR COMPOUNDED ITEM Cmp, lipid   Dx hyperlipidemia 02/14/20   Carollee Herter, Alferd Apa, DO  NONFORMULARY OR COMPOUNDED ITEM Cmp, lipid--- dx htn and hyperlipidemia Microalbumin---  dx DM II 11/13/20   Ann Held, DO  NONFORMULARY OR COMPOUNDED ITEM Hep C antibody--- need for hep c screen 11/13/20   Carollee Herter, Alferd Apa, DO  omeprazole (PRILOSEC) 40 MG capsule TAKE 1 CAPSULE(40 MG) BY MOUTH DAILY 11/13/20   Carollee Herter, Alferd Apa, DO  Semaglutide, 2 MG/DOSE, (OZEMPIC, 2 MG/DOSE,) 8 MG/3ML SOPN Inject 2 mg into the skin once a week. 11/26/20   Philemon Kingdom, MD    Family History Family History  Problem Relation Age of Onset   Diabetes Mother    Hyperlipidemia Mother    Hypertension Mother    Diabetes Father    Hyperlipidemia Father    Hypertension Father     Social History Social History   Tobacco Use   Smoking status: Never   Smokeless tobacco: Never  Substance Use Topics   Alcohol use: No   Drug use: No     Allergies   Patient has no known  allergies.   Review of Systems Review of Systems  Constitutional:  Negative for fatigue and fever.  HENT:  Positive for ear pain. Negative for mouth sores.   Eyes:  Negative for visual disturbance.  Respiratory:  Negative for shortness of breath.   Cardiovascular:  Negative for chest pain.  Gastrointestinal:  Negative for abdominal pain, nausea and vomiting.  Genitourinary:  Negative for genital sores.  Musculoskeletal:  Negative for arthralgias and joint swelling.  Skin:  Negative for color change, rash and wound.  Neurological:  Positive for headaches. Negative for dizziness, weakness and light-headedness.    Physical Exam Triage Vital Signs ED Triage Vitals  Enc Vitals Group  BP      Pulse      Resp      Temp      Temp src      SpO2      Weight      Height      Head Circumference      Peak Flow      Pain Score      Pain Loc      Pain Edu?      Excl. in [place]?    No data found.  Updated Vital Signs BP 112/71 (BP Location: Left Arm)   Pulse 87   Temp 98.5 F (36.9 C) (Oral)   Resp 12   Ht $R'5\' 3"'gQ$  (1.6 m)   Wt 149 lb (67.6 kg)   SpO2 97%   BMI 26.39 kg/m   Visual Acuity Right Eye Distance:   Left Eye Distance:   Bilateral Distance:    Right Eye Near:   Left Eye Near:    Bilateral Near:     Physical Exam Vitals and nursing note reviewed.  Constitutional:      Appearance: She is well-developed.     Comments: No acute distress  HENT:     Head: Normocephalic and atraumatic.     Ears:     Comments: Bilateral ears without tenderness to palpation of external auricle, tragus and mastoid, EAC's without erythema or swelling, TM's with good bony landmarks and cone of light. Non erythematous.      Nose: Nose normal.     Mouth/Throat:     Comments: Oral mucosa pink and moist, no tonsillar enlargement or exudate. Posterior pharynx patent and nonerythematous, no uvula deviation or swelling. Normal phonation.  Eyes:     Conjunctiva/sclera: Conjunctivae normal.   Cardiovascular:     Rate and Rhythm: Normal rate.  Pulmonary:     Effort: Pulmonary effort is normal. No respiratory distress.     Comments: Breathing comfortably at rest, CTABL, no wheezing, rales or other adventitious sounds auscultated  Abdominal:     General: There is no distension.  Musculoskeletal:        General: Normal range of motion.     Cervical back: Neck supple.  Skin:    General: Skin is warm and dry.  Neurological:     Mental Status: She is alert and oriented to person, place, and time.     UC Treatments / Results  Labs (all labs ordered are listed, but only abnormal results are displayed) Labs Reviewed - No data to display  EKG   Radiology No results found.  Procedures Procedures (including critical care time)  Medications Ordered in UC Medications - No data to display  Initial Impression / Assessment and Plan / UC Course  I have reviewed the triage vital signs and the nursing notes.  Pertinent labs & imaging results that were available during my care of the patient were reviewed by me and considered in my medical decision making (see chart for details).     Ear exam unremarkable, no sign of otitis media, possible underlying eustachian tube dysfunction contributing to symptoms and recommending antihistamine/nasal sprays to help with symptoms, anti-inflammatories for headache and ear pain and close monitoring.  Possible early URI given reported postnasal drainage.  Discussed strict return precautions. Patient verbalized understanding and is agreeable with plan.  Final Clinical Impressions(s) / UC Diagnoses   Final diagnoses:  Acute nonintractable headache, unspecified headache type  Acute otalgia, left     Discharge Instructions  No sign of ear infection Please begin daily cetirizine/Zyrtec or loratadine/Claritin Continue Flonase, may use Astelin as alternative to help with any underlying fluid on ears contributing to ear pain Tylenol  and ibuprofen for headache/ear pain Drink plenty of water and fluids Follow-up if not improving or worsening     ED Prescriptions     Medication Sig Dispense Auth. Provider   ibuprofen (ADVIL) 800 MG tablet Take 1 tablet (800 mg total) by mouth 3 (three) times daily. 21 tablet Fatimata Talsma C, PA-C   azelastine (ASTELIN) 0.1 % nasal spray Place 1-2 sprays into both nostrils 2 (two) times daily. Use in each nostril as directed 30 mL Lilly Gasser C, PA-C      PDMP not reviewed this encounter.   Janith Lima, [place] 12/15/20 1453

## 2020-12-15 NOTE — Discharge Instructions (Addendum)
No sign of ear infection Please begin daily cetirizine/Zyrtec or loratadine/Claritin Continue Flonase, may use Astelin as alternative to help with any underlying fluid on ears contributing to ear pain Tylenol and ibuprofen for headache/ear pain Drink plenty of water and fluids Follow-up if not improving or worsening

## 2020-12-15 NOTE — ED Triage Notes (Signed)
Pt seen in UC w/ c/o HA that has been off and on x 1 week and otalgia in LT ear that began last night.

## 2020-12-30 ENCOUNTER — Encounter: Payer: Self-pay | Admitting: Internal Medicine

## 2021-02-07 ENCOUNTER — Encounter: Payer: Self-pay | Admitting: Family Medicine

## 2021-02-08 MED ORDER — EZETIMIBE 10 MG PO TABS: 10.0000 mg | ORAL_TABLET | Freq: Every day | ORAL | 1 refills | Status: DC

## 2021-02-12 ENCOUNTER — Encounter: Payer: Self-pay | Admitting: Internal Medicine

## 2021-02-12 ENCOUNTER — Other Ambulatory Visit: Payer: Self-pay

## 2021-02-12 ENCOUNTER — Ambulatory Visit: Payer: 59 | Admitting: Internal Medicine

## 2021-02-12 VITALS — BP 128/60 | HR 93 | Ht 63.0 in | Wt 148.0 lb

## 2021-02-12 DIAGNOSIS — E785 Hyperlipidemia, unspecified: Secondary | ICD-10-CM

## 2021-02-12 DIAGNOSIS — E1165 Type 2 diabetes mellitus with hyperglycemia: Secondary | ICD-10-CM

## 2021-02-12 LAB — POCT GLYCOSYLATED HEMOGLOBIN (HGB A1C): Hemoglobin A1C: 6.6 % — AB (ref 4.0–5.6)

## 2021-02-12 MED ORDER — GLIPIZIDE ER 5 MG PO TB24: ORAL_TABLET | ORAL | 3 refills | Status: DC

## 2021-02-12 MED ORDER — OZEMPIC (1 MG/DOSE) 4 MG/3ML ~~LOC~~ SOPN: 1.0000 mg | PEN_INJECTOR | SUBCUTANEOUS | 3 refills | Status: DC

## 2021-02-12 NOTE — Patient Instructions (Addendum)
Please increase: - Glipizide XL 10 mg before b'fast  Please continue: - Ozempic 1 mg weekly  Please come back for a follow-up appointment in 4 months.

## 2021-02-12 NOTE — Progress Notes (Signed)
Patient ID: Dominique [name], female   DOB: 1961/04/17, 60 y.o.   MRN: [id number]   This visit occurred during the SARS-CoV-2 public health emergency.  Safety protocols were in place, including screening questions prior to the visit, additional usage of staff PPE, and extensive cleaning of exam room while observing appropriate contact time as indicated for disinfecting solutions.   HPI: Dominique [name] is a 60 y.o.-year-old female, returning for follow-up for DM2, dx in 2016, but GDM in 2000, non-insulin-dependent, uncontrolled, without long term complications.  She previously saw Dr. Loanne [name].  Last visit with me 4 months ago.  Interim history: No increased urination, blurry vision, chest pain.  She still does not sleep well and has fatigue. She had nausea, constipation >> resolved after stopped Metformin and backing off Ozempic. She has L foot neuritis after her foot surgery >1 year ago.  Reviewed HbA1c levels: Lab Results  Component Value Date   HGBA1C 6.7 (A) 10/07/2020   HGBA1C 6.7 (A) 06/04/2020   HGBA1C 6.0 (H) 11/25/2019   HGBA1C 6.3 (A) 06/19/2019   HGBA1C 7.1 (A) 02/12/2019   HGBA1C 6.6 (A) 10/12/2018   HGBA1C 5.9 (A) 06/13/2018   HGBA1C 6.5 (A) 02/22/2018   HGBA1C 7.5 06/20/2017   HGBA1C 6.4 (A) 12/09/2014  HbA1c 8.9%  She is on: - Metformin ER 1000 mg with dinner  - added 01/2019 >> 2000 mg with dinner >> stopped due to nausea - Glipizide XL 5 mg before breakfast - added 07/2018 - Ozempic 0.5 >> 1 >> 2 mg weekly >> decreased due to nausea We have to stop Jardiance 07/2018 due to persistent yeast infections. She stopped metformin IR due to lack of effect. She developed a rash with Trulicity She was previously on nateglinide and also on repaglinide.  Pt checks her sugars once a day: - am: 116, 125-130, 160 >> 119, 123-159 >> 121-152 >> 131-154, 165 - 2h after b'fast: 78, 152 >> n/c >> 135 >> n/c >> 198 >> n/c - before lunch: 97 >> 105 >> n/c - 2h after lunch: 99,  161 >> n/c >> 170 >> n/c >> 143, 200 >> n/c - before dinner: 147 >> n/c - 2h after dinner: 132-175, 199 >> n/c >> 211 >> n/c >> 205 >> 165-198 - bedtime: n/c - nighttime: n/c Lowest sugar was 78 ...  >> 131; she has hypoglycemia awareness in the 90s. Highest sugar was 205 >> 198.  Glucometer: Accu-Chek guide  Pt's meals are: - Breakfast: 1-2 egg, 1-2 toast; egg + bacon - Lunch: eating out, salad, meat + veggies - Dinner: eating out, salad, meat + veggies - Dessert: apple pie, cake - Snacks: chips  -No CKD, last BUN/creatinine:  Lab Results  Component Value Date   BUN 11 12/04/2020   BUN 10 11/19/2020   CREATININE 0.78 12/04/2020   CREATININE 0.82 11/19/2020  On losartan 100.  -+ HL; last set of lipids: Lab Results  Component Value Date   CHOL 129 11/19/2020   HDL 42 11/19/2020   LDLCALC 66 11/19/2020   TRIG 115 11/19/2020   CHOLHDL 3.1 11/19/2020  On Lipitor 10, fenofibrate 160, fish oil 1000 mg 2x a day.  - last eye exam was in 07/2020: No DR reportedly; Dr. Sabra [name]. Sees an ophthalmic surgeon >> contemplates palpebral surgery. On Xiidra and fish oil for dry eyes.  -No numbness and tingling in her feet.  She saw neurology before last visit-no peripheral neuropathy.  Off Gabapentin - prev. For Bell's palsy. She  has a history of a kidney stone in 2019.  Pt has FH of DM in mother and father.  ROS: + see HPI Neurological: no tremors/+ numbness - top of L foot/no tingling/no dizziness  I reviewed pt's medications, allergies, PMH, social hx, family hx, and changes were documented in the history of present illness. Otherwise, unchanged from my initial visit note.  Past Medical History:  Diagnosis Date   Arthritis    left foot   Asthma    Bell palsy 2015   Diabetes mellitus without complication ([place])    type 2   Endometriosis    GERD (gastroesophageal reflux disease)    H/O hiatal hernia    Histoplasmosis 2002   history of, [place] raised   Hypertension     Kidney stone    Kidney stones    Past Surgical History:  Procedure Laterality Date   FOOT ARTHRODESIS Left 09/05/2019   Procedure: Left Modified Mc[place]ride, Left First and Second Tarsometatarsal Joint Arthrodesis;  Surgeon: Dominique Simmer, MD;  Location: [facility] [place];  Service: Orthopedics;  Laterality: Left;   laparatomy  1985   RADIAL HEAD ARTHROPLASTY Left 02/23/2013   Procedure: LEFT RADIAL HEAD ARTHROPLASTY qwith ligament reconstruction;  Surgeon: Dominique Kaufman, MD;  Location: [facility];  Service: Orthopedics;  Laterality: Left;   TONSILLECTOMY AND ADENOIDECTOMY  1967   uterine lining ablation  2002   Social History   Socioeconomic History   Marital status: Married    Spouse name: Not on file   Number of children: Not on file   Years of education: Not on file   Highest education level: Not on file  Occupational History    Comment: unemployed  Tobacco Use   Smoking status: Never   Smokeless tobacco: Never  Substance and Sexual Activity   Alcohol use: No   Drug use: No   Sexual activity: Yes    Partners: Male  Other Topics Concern   Not on file  Social History Narrative   Exercise-- no   Social Determinants of Health   Financial Resource Strain: Not on file  Food Insecurity: Not on file  Transportation Needs: Not on file  Physical Activity: Not on file  Stress: Not on file  Social Connections: Not on file  Intimate Partner Violence: Not on file   Current Outpatient Medications on File Prior to Visit  Medication Sig Dispense Refill   albuterol (PROAIR HFA) 108 (90 Base) MCG/ACT inhaler Inhale 2 puffs into the lungs every 6 (six) hours as needed for wheezing or shortness of breath. 18 g 3   atorvastatin (LIPITOR) 10 MG tablet TAKE 1 TABLET(10 MG) BY MOUTH EVERY OTHER DAY (Patient not taking: Reported on 12/15/2020) 45 tablet 1   azelastine (ASTELIN) 0.1 % nasal spray Place 1-2 sprays into both nostrils 2 (two) times daily. Use in each nostril as directed 30 mL 0    blood glucose meter kit and supplies Dispense based on patient and insurance preference. Use up to four times daily as directed. (FOR ICD-10 E10.9, E11.9). 1 each 0   Blood Glucose Monitoring Suppl (CONTOUR NEXT ONE) KIT 1 kit by Does not apply route See admin instructions. For checking blood sugar 2 times a day 1 kit 0   ezetimibe (ZETIA) 10 MG tablet Take 1 tablet (10 mg total) by mouth daily. 90 tablet 1   fenofibrate 160 MG tablet TAKE 1 TABLET(160 MG) BY MOUTH DAILY. 90 tablet 1   fluticasone (FLONASE) 50 MCG/ACT nasal spray Place 2  sprays into both nostrils daily. 48 g 3   fluticasone (FLOVENT HFA) 110 MCG/ACT inhaler Inhale 2 puffs into the lungs 2 (two) times daily. 36 g 1   gabapentin (NEURONTIN) 100 MG capsule 2 po bid (Patient not taking: Reported on 11/13/2020) 360 capsule 3   glipiZIDE (GLUCOTROL XL) 5 MG 24 hr tablet TAKE 1 TABLET(5 MG) BY MOUTH DAILY WITH BREAKFAST 90 tablet 3   glucose blood (CONTOUR NEXT TEST) test strip Use to check blood sugar 2 times a day. 200 each 12   ibuprofen (ADVIL) 800 MG tablet Take 1 tablet (800 mg total) by mouth 3 (three) times daily. 21 tablet 0   levalbuterol (XOPENEX) 1.25 MG/3ML nebulizer solution USE ONE VIAL BY NEBULIZATION EVERY 6 HOURS AS NEEDED FOR WHEEZING OR SHORTNESS OF BREATH 675 mL 1   losartan (COZAAR) 100 MG tablet TAKE 1 TABLET(100 MG) BY MOUTH DAILY. 90 tablet 1   Microlet Lancets MISC TEST BLOOD SUGAR TWICE DAILY 200 each 3   montelukast (SINGULAIR) 10 MG tablet TAKE 1 TABLET(10 MG) BY MOUTH AT BEDTIME. 90 tablet 1   NONFORMULARY OR COMPOUNDED ITEM Cmp, lipid   Dx hyperlipidemia 1 each 0   NONFORMULARY OR COMPOUNDED ITEM Cmp, lipid--- dx htn and hyperlipidemia Microalbumin---  dx DM II 1 each 0   NONFORMULARY OR COMPOUNDED ITEM Hep C antibody--- need for hep c screen 1 each 0   Omega-3 Fatty Acids (FISH OIL) 1000 MG CAPS Take by mouth.     omeprazole (PRILOSEC) 40 MG capsule TAKE 1 CAPSULE(40 MG) BY MOUTH DAILY 90 capsule 3    Semaglutide, 2 MG/DOSE, (OZEMPIC, 2 MG/DOSE,) 8 MG/3ML SOPN Inject 2 mg into the skin once a week. 9 mL 3   No current facility-administered medications on file prior to visit.   No Known Allergies Family History  Problem Relation Age of Onset   Diabetes Mother    Hyperlipidemia Mother    Hypertension Mother    Diabetes Father    Hyperlipidemia Father    Hypertension Father     PE: BP 128/60 (BP Location: Left Arm, Patient Position: Sitting, Cuff Size: Normal)   Pulse 93   Ht $R'5\' 3"'ZW$  (1.6 m)   Wt 148 lb (67.1 kg)   SpO2 96%   BMI 26.22 kg/m  Wt Readings from Last 3 Encounters:  02/12/21 148 lb (67.1 kg)  12/15/20 149 lb (67.6 kg)  11/13/20 148 lb 9.6 oz (67.4 kg)   Constitutional: normal weight, in NAD Eyes: PERRLA, EOMI, no exophthalmos ENT: moist mucous membranes, no thyromegaly, no cervical lymphadenopathy Cardiovascular: RRR, No MRG Respiratory: CTA B Gastrointestinal: abdomen soft, NT, ND, BS+ Musculoskeletal: no deformities, strength intact in all 4 Skin: moist, warm, no rashes Neurological: no tremor with outstretched hands, DTR normal in all 4 Diabetic Foot Exam - Simple   Simple Foot Form Diabetic Foot exam was performed with the following findings: Yes 02/12/2021  8:54 AM  Visual Inspection No deformities, no ulcerations, no other skin breakdown bilaterally: Yes Sensation Testing Intact to touch and monofilament testing bilaterally: Yes Pulse Check Posterior Tibialis and Dorsalis pulse intact bilaterally: Yes Comments Surgical scar healed on left foot    ASSESSMENT: 1. DM2, non-insulin-dependent, uncontrolled, without long-term complications, but with hyperglycemia  2. HL  PLAN:  1. Patient with longstanding, uncontrolled, type 2 diabetes, on metformin, sulfonylurea, and also weekly GLP-1 receptor agonist.  We also had her on an SGLT2 present in the past we had to stop this due to yeast infections.  We added glipizide after stopping Jardiance.  At last  visit HbA1c was stable, at 6.7%, but this was lower than expected from her meter downloads.  Sugars were almost all higher than target in the morning and later in the day she had blood sugars in the 200s.  At that time, I suggested to increase the metformin dose to 2000 mg with dinner and advised her to split the dose if she could not tolerate entire dose with dinner.  Afterwards, she contacted me with nausea and she had to stop metformin completely.  She is now also taking a little less than 2 mg of Ozempic every week.  I recommended Mylanta. -At today's visit, sugars are above target in the morning and also when she checks it after dinner.  She would not want to restart metformin but it is she is okay with continuing Ozempic at a lower dose, of 1 mg.  Therefore, I suggested to increase glipizide to 10 before breakfast.  I did advise her that if she is more active that day, she can take only - I suggested to:  Patient Instructions  Please increase: - Glipizide XL 10 mg before b'fast  Please continue: - Ozempic 1 mg weekly  Please come back for a follow-up appointment in 4 months.  - we checked her HbA1c: 6.6% (lower) - advised to check sugars at different times of the day - 1x a day, rotating check times - advised for yearly eye exams >> she is UTD - return to clinic in 4 months  2. HL -Reviewed latest lipid panel from 11/2020: All fractions at goal: Lab Results  Component Value Date   CHOL 129 11/19/2020   HDL 42 11/19/2020   LDLCALC 66 11/19/2020   TRIG 115 11/19/2020   CHOLHDL 3.1 11/19/2020  -She continues on Lipitor 10 mg daily, fenofibrate 160 mg daily without side effects.  She is also on fish oil 1000 mg twice a day-for dry eyes.  LABS NEED TO GO TO [facility] -patient's husband works there.  Philemon Kingdom, MD PhD [facility] End[facility]

## 2021-03-16 ENCOUNTER — Encounter: Payer: Self-pay | Admitting: Internal Medicine

## 2021-03-16 DIAGNOSIS — E1165 Type 2 diabetes mellitus with hyperglycemia: Secondary | ICD-10-CM

## 2021-03-16 MED ORDER — GLIPIZIDE ER 5 MG PO TB24: ORAL_TABLET | ORAL | 3 refills | Status: DC

## 2021-04-26 ENCOUNTER — Encounter: Payer: Self-pay | Admitting: Internal Medicine

## 2021-04-26 DIAGNOSIS — E1165 Type 2 diabetes mellitus with hyperglycemia: Secondary | ICD-10-CM

## 2021-04-27 MED ORDER — FREESTYLE LIBRE 2 SENSOR MISC: 1 refills | Status: DC

## 2021-04-27 MED ORDER — DEXCOM G6 SENSOR MISC: 3 refills | Status: DC

## 2021-04-27 MED ORDER — DEXCOM G6 RECEIVER DEVI: 0 refills | Status: DC

## 2021-04-27 MED ORDER — DEXCOM G6 TRANSMITTER MISC: 3 refills | Status: DC

## 2021-04-27 NOTE — Addendum Note (Signed)
Addended by: Kenyon Ana on: 04/27/2021 05:00 PM   Modules accepted: Orders

## 2021-04-28 ENCOUNTER — Telehealth: Payer: Self-pay | Admitting: Pharmacy Technician

## 2021-04-28 ENCOUNTER — Other Ambulatory Visit (HOSPITAL_COMMUNITY): Payer: Self-pay

## 2021-04-28 NOTE — Telephone Encounter (Addendum)
Patient Advocate Encounter  Received notification from [employer] [facility]) that prior authorization for [facility] G6 TRANSMITTER is required.   PA submitted on 12.14.22 TRANSMITTER Key [id number] RECEIVER Key [id number] Status is pending   [facility] will continue to follow  Ricke Hey, CPhT Patient Advocate [facility] Phone: [phone] Fax:  [phone]

## 2021-04-30 NOTE — Telephone Encounter (Signed)
Pt contacted and advised of denial. Pt will contact insurance for further clarification.

## 2021-04-30 NOTE — Telephone Encounter (Signed)
She is not on insulin so she does not meet the criteria... Unfortunately, I am not quite sure I am not sure how to change this.

## 2021-04-30 NOTE — Telephone Encounter (Signed)
Patient Advocate Encounter  Received notification from [employer] that the request for prior authorization for Dexcom Transmitter has been denied due to (1) You are following a treatment plan and an education program on your condition (current diabetes treatment plan and diabetes education and support). (2) You are treated with multiple (three or more) daily injections of insulin or with an insulin pump (continuous subcutaneous insulin infusion pump). (3) One of the following: (A) You need to adjust your insulin dose often each day [on basis of your blood glucose monitor or continuous glucose monitor (CGM) testing results]. (B) You need immediate access to your blood glucose levels to avoid uncontrolled glucose changes.   Specialty Pharmacy Patient Advocate Fax:  [phone]

## 2021-05-04 ENCOUNTER — Other Ambulatory Visit: Payer: Self-pay | Admitting: Family Medicine

## 2021-05-04 DIAGNOSIS — I10 Essential (primary) hypertension: Secondary | ICD-10-CM

## 2021-05-04 DIAGNOSIS — E785 Hyperlipidemia, unspecified: Secondary | ICD-10-CM

## 2021-05-04 DIAGNOSIS — J302 Other seasonal allergic rhinitis: Secondary | ICD-10-CM

## 2021-05-06 ENCOUNTER — Encounter: Payer: Self-pay | Admitting: Internal Medicine

## 2021-05-11 NOTE — Telephone Encounter (Signed)
Pt came and picked up to 2 boxes of Ozempic.

## 2021-05-17 ENCOUNTER — Other Ambulatory Visit (HOSPITAL_COMMUNITY): Payer: Self-pay

## 2021-05-17 ENCOUNTER — Telehealth: Payer: Self-pay

## 2021-05-17 NOTE — Telephone Encounter (Signed)
Patient Advocate Encounter  Prior Authorization for Ozempic has been approved.    PA# [id number]   Effective dates: 06/12/20 through 06/12/21   Spoke with Pharmacy to Process.  Patient Advocate Fax:  [phone]

## 2021-05-17 NOTE — Telephone Encounter (Signed)
Patient Advocate Encounter   Received notification from [facility] that prior authorization renewal for Ozempic is required by his/her insurance [employer].   PA submitted on 05/17/21  Key#: [id number]  Status is pending    [facility] will continue to follow:  Patient Advocate Fax:  [phone]

## 2021-05-19 ENCOUNTER — Encounter: Payer: Self-pay | Admitting: Internal Medicine

## 2021-05-19 ENCOUNTER — Other Ambulatory Visit (HOSPITAL_COMMUNITY): Payer: Self-pay

## 2021-05-19 NOTE — Telephone Encounter (Signed)
Resubmitted the PA for the patient's Dexcom transmitter & supplies via CMM.  New Key#: [id number]

## 2021-05-27 ENCOUNTER — Other Ambulatory Visit (HOSPITAL_COMMUNITY): Payer: Self-pay

## 2021-05-28 ENCOUNTER — Encounter: Payer: Self-pay | Admitting: Internal Medicine

## 2021-05-28 NOTE — Telephone Encounter (Signed)
Unfortunately, if she is not on insulin, they will not approve her Dexcom from previously experience.  I will not do a peer to peer appeal.

## 2021-05-28 NOTE — Telephone Encounter (Signed)
Patient Advocate Encounter  Received notification from [employer] that the request for prior authorization for Dexcom G6 supplies & transmitter has been denied due to the patient not taking insulin.  Also, the CGM not medically necessary.   Dr. Elvera Lennox can call [employer] to do a peer to peer appeal.  The number is: [phone]   Specialty Pharmacy Patient Advocate Fax: [phone]

## 2021-05-30 ENCOUNTER — Encounter: Payer: Self-pay | Admitting: Family Medicine

## 2021-05-30 DIAGNOSIS — J4520 Mild intermittent asthma, uncomplicated: Secondary | ICD-10-CM

## 2021-05-31 MED ORDER — ALBUTEROL SULFATE HFA 108 (90 BASE) MCG/ACT IN AERS
2.0000 | INHALATION_SPRAY | Freq: Four times a day (QID) | RESPIRATORY_TRACT | 2 refills | Status: DC | PRN
Start: 2021-05-31 — End: 2021-08-09

## 2021-06-17 ENCOUNTER — Ambulatory Visit: Payer: 59 | Admitting: Internal Medicine

## 2021-06-17 ENCOUNTER — Encounter: Payer: Self-pay | Admitting: Internal Medicine

## 2021-06-17 ENCOUNTER — Other Ambulatory Visit: Payer: Self-pay

## 2021-06-17 VITALS — BP 124/78 | HR 82 | Ht 63.0 in | Wt 150.8 lb

## 2021-06-17 DIAGNOSIS — E1165 Type 2 diabetes mellitus with hyperglycemia: Secondary | ICD-10-CM | POA: Diagnosis not present

## 2021-06-17 DIAGNOSIS — E785 Hyperlipidemia, unspecified: Secondary | ICD-10-CM | POA: Diagnosis not present

## 2021-06-17 LAB — POCT GLYCOSYLATED HEMOGLOBIN (HGB A1C): Hemoglobin A1C: 7.1 % — AB (ref 4.0–5.6)

## 2021-06-17 MED ORDER — PEN NEEDLES 33G X 4 MM MISC: 3 refills | Status: DC

## 2021-06-17 MED ORDER — LANTUS SOLOSTAR 100 UNIT/ML ~~LOC~~ SOPN: 10.0000 [IU] | PEN_INJECTOR | Freq: Every day | SUBCUTANEOUS | 5 refills | Status: DC

## 2021-06-17 NOTE — Progress Notes (Signed)
Patient ID: Dominique [name], female   DOB: 10-31-1960, 61 y.o.   MRN: [id number]   This visit occurred during the SARS-CoV-2 public health emergency.  Safety protocols were in place, including screening questions prior to the visit, additional usage of staff PPE, and extensive cleaning of exam room while observing appropriate contact time as indicated for disinfecting solutions.   HPI: Dominique [name] is a 61 y.o.-year-old female, returning for follow-up for DM2, dx in 2016, but GDM in 2000, non-insulin-dependent, uncontrolled, without long term complications.  She previously saw Dr. Loanne Drilling.  Last visit with me 4 months ago.  Interim history: No increased urination, blurry vision, chest pain.  She continues to not sleep well (has hot flushes) and has fatigue. Also has diarrhea after meals occasionally. We were not able to obtain a Dexcom CGM for her as this was declined by her insurance despite the preauthorization.  Reviewed HbA1c levels: Lab Results  Component Value Date   HGBA1C 6.6 (A) 02/12/2021   HGBA1C 6.7 (A) 10/07/2020   HGBA1C 6.7 (A) 06/04/2020   HGBA1C 6.0 (H) 11/25/2019   HGBA1C 6.3 (A) 06/19/2019   HGBA1C 7.1 (A) 02/12/2019   HGBA1C 6.6 (A) 10/12/2018   HGBA1C 5.9 (A) 06/13/2018   HGBA1C 6.5 (A) 02/22/2018   HGBA1C 7.5 06/20/2017  HbA1c 8.9%  She is on: - Glipizide XL 5 >> 10 mg before breakfast  - Ozempic 0.5 >> 1 mg weekly  We have to stop Jardiance 07/2018 due to persistent yeast infections. She stopped metformin IR due to lack of effect.  We had to stop metformin ER due to nausea. She developed a rash with Trulicity She was previously on nateglinide and also on repaglinide.  Pt checks her sugars once a day: - am: 119, 123-159 >> 121-152 >> 131-154, 165 >> 135-205, 232 - 2h after b'fast: 78, 152 >> n/c >> 135 >> n/c >> 198 >> n/c - before lunch: 97 >> 105 >> n/c >> 138 - 2h after lunch:  170 >> n/c >> 143, 200 >> n/c >> 151 - before dinner: 147 >> n/c -  2h after dinner: 211 >> n/c >> 205 >> 165-198 >> 207, 251 - bedtime: n/c - nighttime: n/c Lowest sugar was 78 ...  >> 131 >> 135; she has hypoglycemia awareness in the 90s. Highest sugar was 205 >> 198 >> 251.  Glucometer: Accu-Chek guide  Pt's meals are: - Breakfast: 1-2 egg, 1-2 toast; egg + bacon - Lunch: eating out, salad, meat + veggies - Dinner: eating out, salad, meat + veggies - Dessert: apple pie, cake - Snacks: chips  -No CKD, last BUN/creatinine:  Lab Results  Component Value Date   BUN 11 12/04/2020   BUN 10 11/19/2020   CREATININE 0.78 12/04/2020   CREATININE 0.82 11/19/2020  On losartan 100.  -+ HL; last set of lipids: Lab Results  Component Value Date   CHOL 129 11/19/2020   HDL 42 11/19/2020   LDLCALC 66 11/19/2020   TRIG 115 11/19/2020   CHOLHDL 3.1 11/19/2020  On Lipitor 10, fenofibrate 160, fish oil 1000 mg 2x a day.  - last eye exam was in 07/2020: No DR reportedly; Dr. Sabra Heck. Sees an ophthalmic surgeon >> contemplates palpebral surgery. On Xiidra and fish oil for dry eyes.  -No numbness and tingling in her feet.  She saw neurology -no peripheral neuropathy.  Off Gabapentin - prev. For Bell's palsy. She has a history of a kidney stone in 2019.  Pt has [place]  of DM in mother and father.  ROS: + see HPI Neurological: no tremors/+ numbness - top of L foot/no tingling/no dizziness  I reviewed pt's medications, allergies, PMH, social hx, family hx, and changes were documented in the history of present illness. Otherwise, unchanged from my initial visit note.  Past Medical History:  Diagnosis Date   Arthritis    left foot   Asthma    Bell palsy 2015   Diabetes mellitus without complication ([place])    type 2   Endometriosis    GERD (gastroesophageal reflux disease)    H/O hiatal hernia    Histoplasmosis 2002   history of, [place] raised   Hypertension    Kidney stone    Kidney stones    Past Surgical History:  Procedure Laterality Date    FOOT ARTHRODESIS Left 09/05/2019   Procedure: Left Modified McBride, Left First and Second Tarsometatarsal Joint Arthrodesis;  Surgeon: Wylene Simmer, MD;  Location: [facility];  Service: Orthopedics;  Laterality: Left;   laparatomy  1985   RADIAL HEAD ARTHROPLASTY Left 02/23/2013   Procedure: LEFT RADIAL HEAD ARTHROPLASTY qwith ligament reconstruction;  Surgeon: Roseanne Kaufman, MD;  Location: [facility];  Service: Orthopedics;  Laterality: Left;   TONSILLECTOMY AND ADENOIDECTOMY  1967   uterine lining ablation  2002   Social History   Socioeconomic History   Marital status: Married    Spouse name: Not on file   Number of children: Not on file   Years of education: Not on file   Highest education level: Not on file  Occupational History    Comment: unemployed  Tobacco Use   Smoking status: Never   Smokeless tobacco: Never  Substance and Sexual Activity   Alcohol use: No   Drug use: No   Sexual activity: Yes    Partners: Male  Other Topics Concern   Not on file  Social History Narrative   Exercise-- no   Social Determinants of Health   Financial Resource Strain: Not on file  Food Insecurity: Not on file  Transportation Needs: Not on file  Physical Activity: Not on file  Stress: Not on file  Social Connections: Not on file  Intimate Partner Violence: Not on file   Current Outpatient Medications on File Prior to Visit  Medication Sig Dispense Refill   albuterol (PROAIR HFA) 108 (90 Base) MCG/ACT inhaler Inhale 2 puffs into the lungs every 6 (six) hours as needed for wheezing or shortness of breath. 18 g 2   atorvastatin (LIPITOR) 10 MG tablet TAKE 1 TABLET(10 MG) BY MOUTH EVERY OTHER DAY (Patient not taking: Reported on 12/15/2020) 45 tablet 1   azelastine (ASTELIN) 0.1 % nasal spray Place 1-2 sprays into both nostrils 2 (two) times daily. Use in each nostril as directed 30 mL 0   blood glucose meter kit and supplies Dispense based on patient and insurance preference.  Use up to four times daily as directed. (FOR ICD-10 E10.9, E11.9). 1 each 0   Blood Glucose Monitoring Suppl (CONTOUR NEXT ONE) KIT 1 kit by Does not apply route See admin instructions. For checking blood sugar 2 times a day 1 kit 0   Continuous Blood Gluc Receiver (DEXCOM G6 RECEIVER) DEVI Use as instructed to check blood sugar 1 each 0   Continuous Blood Gluc Sensor (DEXCOM G6 SENSOR) MISC Use as instructed, change every 10 days 9 each 3   Continuous Blood Gluc Transmit (DEXCOM G6 TRANSMITTER) MISC Use as instructed, change every 90 days 1  each 3   ezetimibe (ZETIA) 10 MG tablet Take 1 tablet (10 mg total) by mouth daily. 90 tablet 1   fenofibrate 160 MG tablet TAKE 1 TABLET(160 MG) BY MOUTH DAILY 90 tablet 1   fluticasone (FLONASE) 50 MCG/ACT nasal spray Place 2 sprays into both nostrils daily. 48 g 3   fluticasone (FLOVENT HFA) 110 MCG/ACT inhaler Inhale 2 puffs into the lungs 2 (two) times daily. 36 g 1   glipiZIDE (GLUCOTROL XL) 5 MG 24 hr tablet TAKE 2 TABLET(5 MG) BY MOUTH DAILY BEFORE BREAKFAST 180 tablet 3   glucose blood (CONTOUR NEXT TEST) test strip Use to check blood sugar 2 times a day. 200 each 12   ibuprofen (ADVIL) 800 MG tablet Take 1 tablet (800 mg total) by mouth 3 (three) times daily. 21 tablet 0   levalbuterol (XOPENEX) 1.25 MG/3ML nebulizer solution USE ONE VIAL BY NEBULIZATION EVERY 6 HOURS AS NEEDED FOR WHEEZING OR SHORTNESS OF BREATH 675 mL 1   losartan (COZAAR) 100 MG tablet TAKE 1 TABLET(100 MG) BY MOUTH DAILY 90 tablet 1   Microlet Lancets MISC TEST BLOOD SUGAR TWICE DAILY 200 each 3   montelukast (SINGULAIR) 10 MG tablet TAKE 1 TABLET(10 MG) BY MOUTH AT BEDTIME 90 tablet 1   NONFORMULARY OR COMPOUNDED ITEM Cmp, lipid   Dx hyperlipidemia 1 each 0   NONFORMULARY OR COMPOUNDED ITEM Cmp, lipid--- dx htn and hyperlipidemia Microalbumin---  dx DM II 1 each 0   NONFORMULARY OR COMPOUNDED ITEM Hep C antibody--- need for hep c screen 1 each 0   Omega-3 Fatty Acids (FISH OIL)  1000 MG CAPS Take by mouth.     omeprazole (PRILOSEC) 40 MG capsule TAKE 1 CAPSULE(40 MG) BY MOUTH DAILY 90 capsule 3   Semaglutide, 1 MG/DOSE, (OZEMPIC, 1 MG/DOSE,) 4 MG/3ML SOPN Inject 1 mg into the skin once a week. 9 mL 3   No current facility-administered medications on file prior to visit.   No Known Allergies Family History  Problem Relation Age of Onset   Diabetes Mother    Hyperlipidemia Mother    Hypertension Mother    Diabetes Father    Hyperlipidemia Father    Hypertension Father     PE: There were no vitals taken for this visit. Wt Readings from Last 3 Encounters:  02/12/21 148 lb (67.1 kg)  12/15/20 149 lb (67.6 kg)  11/13/20 148 lb 9.6 oz (67.4 kg)   Constitutional: normal weight, in NAD Eyes: PERRLA, EOMI, no exophthalmos ENT: moist mucous membranes, no thyromegaly, no cervical lymphadenopathy Cardiovascular: RRR, No MRG Respiratory: CTA B Musculoskeletal: no deformities, strength intact in all 4 Skin: moist, warm, no rashes Neurological: no tremor with outstretched hands, DTR normal in all 4  ASSESSMENT: 1. DM2, non-insulin-dependent, uncontrolled, without long-term complications, but with hyperglycemia  2. HL  PLAN:  1. Patient with longstanding, uncontrolled, type 2 diabetes, on sulfonylurea and weekly GLP-1 receptor agonist.  We also had her on metformin but she developed nausea and we had to stop it completely.  Also, we had her on SGLT2 inhibitors but we had to stop due to yeast infections.  We added glipizide after stopping Jardiance.  At last visit, blood sugars were above target in the morning and also when she was checking them after dinner.  She did not want to restart metformin but continued Ozempic at 1 mg weekly despite slight nausea.  I recommended Mylanta.  I also suggested to increase glipizide to 10 mg before breakfast.  I  advised her to take only 5 mg if she plans to be more active during the day.  HbA1c at last visit was lower, at 6.6%. -At  this visit, sugars are higher, up to 200s.  No lows.  Due to previous intolerances, at this visit, I suggested to add Lantus at a low dose and increase as tolerated.  Explained how to do the injection correctly and how to titrate the dose.  For now we will continue Ozempic and glipizide at the current doses. -We will resubmit the preauthorization form for the Dexcom CGM since she is now on insulin. - I suggested to:  Patient Instructions  Please continue: - Glipizide XL 10 mg before b'fast - Ozempic 1 mg weekly  Please start: - Lantus 10 units at bedtime. Increase by 2 units every 4 days until sugars in am are <130 consistently or you get to 26 units.  Please come back for a follow-up appointment in 3 months.  - we checked her HbA1c: 7.1% (higher) - advised to check sugars at different times of the day - 4x a day, rotating check times - advised for yearly eye exams >> she is UTD - return to clinic in 4 months  2. HL -Reviewed latest lipid panel from 11/2020: All fractions at goal: Lab Results  Component Value Date   CHOL 129 11/19/2020   HDL 42 11/19/2020   LDLCALC 66 11/19/2020   TRIG 115 11/19/2020   CHOLHDL 3.1 11/19/2020  -She is on Lipitor 10 mg daily, fenofibrate 160 mg daily without side effects.  She also continues on 1000 mg fish oil twice a day (for dry eyes)  LABS NEED TO GO TO [facility] -patient's husband works there.  Philemon Kingdom, MD PhD [facility]

## 2021-06-17 NOTE — Patient Instructions (Addendum)
Please continue: - Glipizide XL 10 mg before b'fast - Ozempic 1 mg weekly  Please start: - Lantus 10 units at bedtime. Increase by 2 units every 4 days until sugars in am are <130 consistently or you get to 26 units.  Please come back for a follow-up appointment in 3 months.

## 2021-07-02 ENCOUNTER — Encounter: Payer: Self-pay | Admitting: Internal Medicine

## 2021-07-07 ENCOUNTER — Other Ambulatory Visit (HOSPITAL_COMMUNITY): Payer: Self-pay

## 2021-07-07 ENCOUNTER — Telehealth: Payer: Self-pay

## 2021-07-07 NOTE — Telephone Encounter (Signed)
Patient Advocate Encounter  Received notification from [employer] that the request for prior authorization for Dexcom G6 supplies has been denied due to the patient not having 3 or more daily insulin injections.  See PA denial letter from 05/27/21.   Specialty Pharmacy Patient Advocate Fax: [phone]

## 2021-07-07 NOTE — Telephone Encounter (Signed)
Patient Advocate Encounter   Received notification from patient calls that prior authorization for Dexcom G6 transmitter, sensors & receiver is required by his/her insurance [employer].   PA submitted on 07/07/21  Key#: [id number] (Sensors) Key#: [employer] Barrister's clerk) Key#: [id number] (Receiver)  Status is pending    [facility] will continue to follow:  Patient Advocate Fax: [phone]

## 2021-07-08 NOTE — Telephone Encounter (Signed)
I am sorry to hear that.  I think that the only option in that case is for her to pay for freestyle libre out-of-pocket.

## 2021-07-19 ENCOUNTER — Encounter: Payer: Self-pay | Admitting: Internal Medicine

## 2021-07-20 ENCOUNTER — Encounter: Payer: Self-pay | Admitting: Internal Medicine

## 2021-08-04 ENCOUNTER — Ambulatory Visit: Payer: 59 | Admitting: Neurology

## 2021-08-04 ENCOUNTER — Other Ambulatory Visit: Payer: Self-pay

## 2021-08-04 ENCOUNTER — Encounter: Payer: Self-pay | Admitting: Neurology

## 2021-08-04 VITALS — BP 131/78 | HR 89 | Ht 63.0 in | Wt 153.5 lb

## 2021-08-04 DIAGNOSIS — G510 Bell's palsy: Secondary | ICD-10-CM | POA: Diagnosis not present

## 2021-08-04 NOTE — Progress Notes (Signed)
? ? ?PATIENT: Dominique [name] ?DOB: Apr 09, 1961 ? ?REASON FOR VISIT: follow up for Bell's Palsy 2014, 2016, 2018 ?HISTORY FROM: patient, husband ?PRIMARY NEUROLOGIST: Dr. Jannifer Franklin  ? ?HISTORY OF PRESENT ILLNESS: ?Today 08/04/21  ?[name] here today for follow-up. Last A1C 7.1 in Feb 2023. She stopped the gabapentin on her own 1 year ago, didn't feel it helped, was on 100 mg BID. Still has right sided sensation of heaviness of the eyelid, smiling is asymmetric, may drool at times. Started some twitching again to right sided face. Seeing eye doctor next week, reports changes to vision. Still paresthesia to left foot, surgeon reported neuritis. Did have botox done to left eyebrow to make more symmetric, only did it once. Symptoms all worsen when she is upset. Can having tingling that comes and goes to right side of face. Symptoms are no worse. ? ?Update 08/04/2020 SS: Ms. Dominique [name] is a 61 year old female with history of right-sided Bell's palsy in 2014.  She remains on gabapentin for the prior Bell's Palsy discomfort/swelling/hypersensitivity usually flares during times of stress. She initially had right-sided Bell's palsy November 2014, never regained full function of the right face, had another episode October 2016, and again in September 2018.  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 Her initial Bell's palsy in 2014, occurred at a time when she had undiagnosed diabetes with an A1c around 13, and she had just undergone surgery for a broken radial head.  ? ?She had left foot surgery back in April 2021.  Since then, has had continued pain.  Reports sensation bottom of the foot is swollen when she walks, makes her feel unsteady.  Has numbness to the lateral aspect of the left foot.  The whole foot is hypersensitive.  There is no back pain, or pain down the left left.  No pain to the right foot.  She did not have any of these issues prior surgery.  At some point was told she had neuropathy, as  result, she was denied life insurance.  We have prescribed gabapentin 100 mg twice daily for her prior Bell's palsy, during the last year, her dose has been increased due to to the left foot pain.  Here today for evaluation accompanied by her husband.  Of note, her last A1c in January 2022 was 6.7. ? ?HISTORY  ?07/31/2019 SS: [name] is a 61 year old female with history of right-sided Bell's palsy in 2014.  She remains on gabapentin with benefit. CT scan of the brain in April 2019 was unremarkable.  She does have a sensation of right-sided swelling, like a brick, specifically whenever she gets stressed or upset.  Can also be triggered if she has been talking a lot.  She initially had right-sided Bell's palsy in November 2014, never regained full function of the right face, had another episode October 2016, and again in September 2018.  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  She is planning to have surgery on her left foot for bunion with Dr. Doran Durand coming up. Her initial Bell's palsy in 2014, occurred at a time when she had undiagnosed diabetes with an A1c around 13, and she had just undergone surgery for a broken radial head.  She presents today for evaluation via virtual visit. ? ?REVIEW OF SYSTEMS: Out of a complete 14 system review of symptoms, the patient complains only of the following symptoms, and all other reviewed systems are negative. ? ?See HPI ? ?ALLERGIES: ?No Known Allergies ? ?  HOME MEDICATIONS: ?Outpatient Medications Prior to Visit  ?Medication Sig Dispense Refill  ? albuterol (PROAIR HFA) 108 (90 Base) MCG/ACT inhaler Inhale 2 puffs into the lungs every 6 (six) hours as needed for wheezing or shortness of breath. 18 g 2  ? azelastine (ASTELIN) 0.1 % nasal spray Place 1-2 sprays into both nostrils 2 (two) times daily. Use in each nostril as directed 30 mL 0  ? blood glucose meter kit and supplies Dispense based on patient and insurance preference. Use up to four times  daily as directed. (FOR ICD-10 E10.9, E11.9). 1 each 0  ? Blood Glucose Monitoring Suppl (CONTOUR NEXT ONE) KIT 1 kit by Does not apply route See admin instructions. For checking blood sugar 2 times a day 1 kit 0  ? Cholecalciferol (VITAMIN D3 PO) Take 50 mcg by mouth daily.    ? Continuous Blood Gluc Receiver (DEXCOM G6 RECEIVER) DEVI Use as instructed to check blood sugar 1 each 0  ? ezetimibe (ZETIA) 10 MG tablet Take 1 tablet (10 mg total) by mouth daily. 90 tablet 1  ? fenofibrate 160 MG tablet TAKE 1 TABLET(160 MG) BY MOUTH DAILY 90 tablet 1  ? fluticasone (FLONASE) 50 MCG/ACT nasal spray Place 2 sprays into both nostrils daily. 48 g 3  ? fluticasone (FLOVENT HFA) 110 MCG/ACT inhaler Inhale 2 puffs into the lungs 2 (two) times daily. 36 g 1  ? glipiZIDE (GLUCOTROL XL) 5 MG 24 hr tablet TAKE 2 TABLET(5 MG) BY MOUTH DAILY BEFORE BREAKFAST 180 tablet 3  ? glucose blood (CONTOUR NEXT TEST) test strip Use to check blood sugar 2 times a day. 200 each 12  ? insulin glargine (LANTUS SOLOSTAR) 100 UNIT/ML Solostar Pen Inject 10-26 Units into the skin at bedtime. 15 mL 5  ? Insulin Pen Needle (PEN NEEDLES) 33G X 4 MM MISC Use 1x a day 100 each 3  ? levalbuterol (XOPENEX) 1.25 MG/3ML nebulizer solution USE ONE VIAL BY NEBULIZATION EVERY 6 HOURS AS NEEDED FOR WHEEZING OR SHORTNESS OF BREATH 675 mL 1  ? losartan (COZAAR) 100 MG tablet TAKE 1 TABLET(100 MG) BY MOUTH DAILY 90 tablet 1  ? Microlet Lancets MISC TEST BLOOD SUGAR TWICE DAILY 200 each 3  ? montelukast (SINGULAIR) 10 MG tablet TAKE 1 TABLET(10 MG) BY MOUTH AT BEDTIME 90 tablet 1  ? NONFORMULARY OR COMPOUNDED ITEM Cmp, lipid   Dx hyperlipidemia 1 each 0  ? NONFORMULARY OR COMPOUNDED ITEM Cmp, lipid--- dx htn and hyperlipidemia ?Microalbumin---  dx DM II 1 each 0  ? NONFORMULARY OR COMPOUNDED ITEM Hep C antibody--- need for hep c screen 1 each 0  ? Omega-3 Fatty Acids (FISH OIL) 1000 MG CAPS Take by mouth.    ? omeprazole (PRILOSEC) 40 MG capsule TAKE 1 CAPSULE(40  MG) BY MOUTH DAILY 90 capsule 3  ? Semaglutide, 1 MG/DOSE, (OZEMPIC, 1 MG/DOSE,) 4 MG/3ML SOPN Inject 1 mg into the skin once a week. 9 mL 3  ? vitamin C (ASCORBIC ACID) 500 MG tablet Take 500 mg by mouth daily.    ? atorvastatin (LIPITOR) 10 MG tablet TAKE 1 TABLET(10 MG) BY MOUTH EVERY OTHER DAY (Patient not taking: Reported on 12/15/2020) 45 tablet 1  ? ?No facility-administered medications prior to visit.  ? ? ?PAST MEDICAL HISTORY: ?Past Medical History:  ?Diagnosis Date  ? Arthritis   ? left foot  ? Asthma   ? Bell palsy 2015  ? Diabetes mellitus without complication ([facility])   ? type 2  ? Endometriosis   ?   GERD (gastroesophageal reflux disease)   ? H/O hiatal hernia   ? Histoplasmosis 2002  ? history of, [place] raised  ? Hypertension   ? Kidney stone   ? Kidney stones   ? ? ?PAST SURGICAL HISTORY: ?Past Surgical History:  ?Procedure Laterality Date  ? FOOT ARTHRODESIS Left 09/05/2019  ? Procedure: Left Modified McBride, Left First and Second Tarsometatarsal Joint Arthrodesis;  Surgeon: Hewitt, John, MD;  Location: [facility];  Service: Orthopedics;  Laterality: Left;  ? laparatomy  1985  ? RADIAL HEAD ARTHROPLASTY Left 02/23/2013  ? Procedure: LEFT RADIAL HEAD ARTHROPLASTY qwith ligament reconstruction;  Surgeon: William Gramig, MD;  Location: [facility];  Service: Orthopedics;  Laterality: Left;  ? TONSILLECTOMY AND ADENOIDECTOMY  1967  ? uterine lining ablation  2002  ? ? ?FAMILY HISTORY: ?Family History  ?Problem Relation Age of Onset  ? Diabetes Mother   ? Hyperlipidemia Mother   ? Hypertension Mother   ? Diabetes Father   ? Hyperlipidemia Father   ? Hypertension Father   ? ? ?SOCIAL HISTORY: ?Social History  ? ?Socioeconomic History  ? Marital status: Married  ?  Spouse name: Not on file  ? Number of children: Not on file  ? Years of education: Not on file  ? Highest education level: Not on file  ?Occupational History  ?  Comment: unemployed  ?Tobacco Use  ? Smoking status: Never  ? Smokeless  tobacco: Never  ?Substance and Sexual Activity  ? Alcohol use: No  ? Drug use: No  ? Sexual activity: Yes  ?  Partners: Male  ?Other Topics Concern  ? Not on file  ?Social History Narrative  ? Exercise-- no  ? ?So

## 2021-08-07 ENCOUNTER — Other Ambulatory Visit: Payer: Self-pay | Admitting: Family Medicine

## 2021-08-07 DIAGNOSIS — K219 Gastro-esophageal reflux disease without esophagitis: Secondary | ICD-10-CM

## 2021-08-07 DIAGNOSIS — J302 Other seasonal allergic rhinitis: Secondary | ICD-10-CM

## 2021-08-07 DIAGNOSIS — J4520 Mild intermittent asthma, uncomplicated: Secondary | ICD-10-CM

## 2021-08-09 ENCOUNTER — Other Ambulatory Visit: Payer: Self-pay | Admitting: Family Medicine

## 2021-08-09 DIAGNOSIS — J4520 Mild intermittent asthma, uncomplicated: Secondary | ICD-10-CM

## 2021-08-10 LAB — HM DIABETES EYE EXAM

## 2021-08-11 ENCOUNTER — Encounter: Payer: Self-pay | Admitting: Internal Medicine

## 2021-08-18 ENCOUNTER — Encounter: Payer: Self-pay | Admitting: Internal Medicine

## 2021-08-23 ENCOUNTER — Other Ambulatory Visit: Payer: Self-pay | Admitting: Internal Medicine

## 2021-08-24 ENCOUNTER — Encounter: Payer: Self-pay | Admitting: Internal Medicine

## 2021-09-02 ENCOUNTER — Encounter: Payer: Self-pay | Admitting: Internal Medicine

## 2021-09-03 ENCOUNTER — Encounter: Payer: Self-pay | Admitting: Internal Medicine

## 2021-09-06 ENCOUNTER — Other Ambulatory Visit: Payer: Self-pay | Admitting: Internal Medicine

## 2021-09-06 MED ORDER — DEXCOM G7 SENSOR MISC: 3.0000 | 4 refills | Status: DC

## 2021-09-06 MED ORDER — DEXCOM G7 RECEIVER DEVI: 1.0000 | Freq: Once | 0 refills | Status: DC

## 2021-09-07 ENCOUNTER — Other Ambulatory Visit (HOSPITAL_COMMUNITY): Payer: Self-pay

## 2021-09-07 NOTE — Telephone Encounter (Signed)
I am hearing that soon Medicare will not require to be on insulin to cover CGM's.  I am thinking the other insurances will take after Medicare.  I hope. ?

## 2021-09-07 NOTE — Telephone Encounter (Signed)
Can you please order this for her. ?

## 2021-09-10 ENCOUNTER — Encounter: Payer: Self-pay | Admitting: Internal Medicine

## 2021-09-13 ENCOUNTER — Encounter: Payer: Self-pay | Admitting: Internal Medicine

## 2021-09-14 ENCOUNTER — Encounter: Payer: Self-pay | Admitting: Internal Medicine

## 2021-09-16 ENCOUNTER — Encounter: Payer: Self-pay | Admitting: Internal Medicine

## 2021-09-20 NOTE — Telephone Encounter (Signed)
No,

## 2021-09-21 ENCOUNTER — Encounter: Payer: Self-pay | Admitting: Internal Medicine

## 2021-09-22 NOTE — Telephone Encounter (Signed)
Pt does not meet criteria for the Dexcom G7. Will see if we can appeal this decision.

## 2021-09-29 ENCOUNTER — Other Ambulatory Visit (HOSPITAL_COMMUNITY): Payer: Self-pay

## 2021-09-30 ENCOUNTER — Other Ambulatory Visit (HOSPITAL_COMMUNITY): Payer: Self-pay

## 2021-09-30 NOTE — Telephone Encounter (Signed)
Expedited appeal has been entered for Dexcom G7. Per insurance an independent external review will occur and decision will be within 45 calendar days.

## 2021-10-04 ENCOUNTER — Other Ambulatory Visit (HOSPITAL_COMMUNITY): Payer: Self-pay

## 2021-10-07 ENCOUNTER — Other Ambulatory Visit (HOSPITAL_COMMUNITY): Payer: Self-pay

## 2021-10-07 NOTE — Telephone Encounter (Signed)
Received a letter from [employer] that the [facility] G6 Receiver has been approved. Per test claim for sensors, it appears they were approved as well. The pharmacy filled them on 10/01/21

## 2021-10-14 ENCOUNTER — Encounter: Payer: Self-pay | Admitting: *Deleted

## 2021-10-20 ENCOUNTER — Ambulatory Visit (INDEPENDENT_AMBULATORY_CARE_PROVIDER_SITE_OTHER): Payer: 59 | Admitting: Internal Medicine

## 2021-10-20 ENCOUNTER — Encounter: Payer: Self-pay | Admitting: Internal Medicine

## 2021-10-20 VITALS — BP 130/82 | HR 86 | Ht 63.0 in | Wt 153.0 lb

## 2021-10-20 DIAGNOSIS — E1165 Type 2 diabetes mellitus with hyperglycemia: Secondary | ICD-10-CM

## 2021-10-20 DIAGNOSIS — E785 Hyperlipidemia, unspecified: Secondary | ICD-10-CM

## 2021-10-20 LAB — POCT GLYCOSYLATED HEMOGLOBIN (HGB A1C): Hemoglobin A1C: 7.1 % — AB (ref 4.0–5.6)

## 2021-10-20 MED ORDER — PEN NEEDLES 33G X 4 MM MISC
3 refills | Status: DC
Start: 2021-10-20 — End: 2022-12-01

## 2021-10-20 MED ORDER — LANTUS SOLOSTAR 100 UNIT/ML ~~LOC~~ SOPN: 40.0000 [IU] | PEN_INJECTOR | Freq: Every day | SUBCUTANEOUS | 3 refills | Status: DC

## 2021-10-20 MED ORDER — LYUMJEV KWIKPEN 100 UNIT/ML ~~LOC~~ SOPN: 6.0000 [IU] | PEN_INJECTOR | Freq: Two times a day (BID) | SUBCUTANEOUS | 11 refills | Status: DC

## 2021-10-20 NOTE — Patient Instructions (Addendum)
Please continue: - Ozempic 1 mg weekly  Please increase: - Lantus 40 units at bedtime  Please schedule an appt with Oran Rein with nutrition.  Please start: - Lyumjev 6-10 units before b'fast and dinner  Try to stop Glipizide.  Please come back for a follow-up appointment in 3-4 months.

## 2021-10-20 NOTE — Progress Notes (Signed)
Patient ID: [name], female   DOB: 29-Sep-1960, 61 y.o.   MRN: [id number]   HPI: Dominique [name] is a 61 y.o.-year-old female, returning for follow-up for DM2, dx in 2016, but GDM in 2000, non-insulin-dependent, uncontrolled, without long term complications.  She previously saw Dr. Loanne Drilling.  Last visit with me 4 months ago.  Interim history: No increased urination, blurry vision, chest pain.  She continues to not sleep well (has hot flushes) and has fatigue.   Her nausea resolved after backing off the Ozempic dose.    Reviewed HbA1c levels: Lab Results  Component Value Date   HGBA1C 7.1 (A) 06/17/2021   HGBA1C 6.6 (A) 02/12/2021   HGBA1C 6.7 (A) 10/07/2020   HGBA1C 6.7 (A) 06/04/2020   HGBA1C 6.0 (H) 11/25/2019   HGBA1C 6.3 (A) 06/19/2019   HGBA1C 7.1 (A) 02/12/2019   HGBA1C 6.6 (A) 10/12/2018   HGBA1C 5.9 (A) 06/13/2018   HGBA1C 6.5 (A) 02/22/2018  HbA1c 8.9%  She is on: - Glipizide XL 5 >> 10 mg >> 5 mg + 5 mg crushed before breakfast  - Ozempic 0.5 >> 1 mg weekly  (had nausea with 2 mg dose) - Lantus 10 units in HS - added 06/2021 >> 34 units in HS We have to stop Jardiance 07/2018 due to persistent yeast infections. She stopped metformin IR due to lack of effect.  We had to stop metformin ER due to nausea. She developed a rash with Trulicity She was previously on nateglinide and also on repaglinide.  Pt checks her sugars  >4x a day with her Dexcom G6 CGM, obtained after last visit:     Prev.: - am: 119, 123-159 >> 121-152 >> 131-154, 165 >> 135-205, 232 - 2h after b'fast: 78, 152 >> n/c >> 135 >> n/c >> 198 >> n/c - before lunch: 97 >> 105 >> n/c >> 138 - 2h after lunch:  170 >> n/c >> 143, 200 >> n/c >> 151 - before dinner: 147 >> n/c - 2h after dinner: 211 >> n/c >> 205 >> 165-198 >> 207, 251 - bedtime: n/c - nighttime: n/c Lowest sugar was 78 ...  >> 131 >> 135 >> 125; she has hypoglycemia awareness in the 90s. Highest sugar was 205 >> 198 >> 251 >>  300.  Glucometer: Accu-Chek guide  Pt's meals are: - Breakfast: 1-2 egg, 1-2 toast; egg + bacon - Lunch: eating out, salad, meat + veggies - Dinner: eating out, salad, meat + veggies - Dessert: apple pie, cake - Snacks: chips  -No CKD, last BUN/creatinine:  Lab Results  Component Value Date   BUN 11 12/04/2020   BUN 10 11/19/2020   CREATININE 0.78 12/04/2020   CREATININE 0.82 11/19/2020  On losartan 100.  -+ HL; last set of lipids: Lab Results  Component Value Date   CHOL 129 11/19/2020   HDL 42 11/19/2020   LDLCALC 66 11/19/2020   TRIG 115 11/19/2020   CHOLHDL 3.1 11/19/2020  On Lipitor 10, fenofibrate 160, fish oil 1000 mg 2x a day.  - last eye exam was in 07/2021: No DR; Dr. Sabra Heck. Sees an ophthalmic surgeon >> contemplates palpebral surgery. On Xiidra and fish oil for dry eyes.  -No numbness and tingling in her feet.  She saw neurology -no peripheral neuropathy.  Last foot exam 02/12/2021.  Off Gabapentin - prev. For Bell's palsy. She has a history of a kidney stone in 2019.  Pt has FH of DM in mother and father.  ROS: +  see HPI Neurological: no tremors/+ numbness - top of L foot/no tingling/no dizziness  I reviewed pt's medications, allergies, PMH, social hx, family hx, and changes were documented in the history of present illness. Otherwise, unchanged from my initial visit note.  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   Hypertension    Kidney stone    Kidney stones    Past Surgical History:  Procedure Laterality Date   FOOT ARTHRODESIS Left 09/05/2019   Procedure: Left Modified McBride, Left First and Second Tarsometatarsal Joint Arthrodesis;  Surgeon: Wylene Simmer, MD;  Location: [facility];   Service: Orthopedics;  Laterality: Left;   laparatomy  1985   RADIAL HEAD ARTHROPLASTY Left 02/23/2013   Procedure: LEFT RADIAL HEAD ARTHROPLASTY qwith ligament reconstruction;  Surgeon: Roseanne Kaufman, MD;  Location: [facility];  Service: Orthopedics;  Laterality: Left;   TONSILLECTOMY AND ADENOIDECTOMY  1967   uterine lining ablation  2002   Social History   Socioeconomic History   Marital status: Married    Spouse name: Not on file   Number of children: Not on file   Years of education: Not on file   Highest education level: Not on file  Occupational History    Comment: unemployed  Tobacco Use   Smoking status: Never   Smokeless tobacco: Never  Substance and Sexual Activity   Alcohol use: No   Drug use: No   Sexual activity: Yes    Partners: Male  Other Topics Concern   Not on file  Social History Narrative   Exercise-- no   Social Determinants of Health   Financial Resource Strain: Not on file  Food Insecurity: Not on file  Transportation Needs: Not on file  Physical Activity: Not on file  Stress: Not on file  Social Connections: Not on file  Intimate Partner Violence: Not on file   Current Outpatient Medications on File Prior to Visit  Medication Sig Dispense Refill   albuterol (VENTOLIN HFA) 108 (90 Base) MCG/ACT inhaler INHALE 2 PUFFS INTO THE LUNGS EVERY 6 HOURS AS NEEDED FOR WHEEZING OR SHORTNESS OF BREATH 20.1 g 1   azelastine (ASTELIN) 0.1 % nasal spray Place 1-2 sprays into both nostrils 2 (two) times daily. Use in each nostril as directed 30 mL 0   blood glucose meter kit and supplies Dispense based on patient and insurance preference. Use up to four times daily as directed. (FOR ICD-10 E10.9, E11.9). 1 each 0   Blood Glucose Monitoring Suppl (CONTOUR NEXT ONE) KIT 1 kit by Does not apply route See admin instructions. For checking blood sugar 2 times a day 1 kit 0   Cholecalciferol (VITAMIN D3 PO) Take 50 mcg by mouth daily.     Continuous Blood Gluc Receiver  ([place]) [place] 1 each by Does not apply route once for 1 dose. 1 each 0   Continuous Blood Gluc Sensor (DEXCOM G7 SENSOR) MISC 3 each by Does not apply route every 30 (thirty) days. Apply 1 sensor every 10 days 9 each 4   ezetimibe (ZETIA) 10 MG tablet Take 1 tablet (10 mg total) by mouth daily. 90 tablet 1   fenofibrate 160 MG tablet  TAKE 1 TABLET(160 MG) BY MOUTH DAILY 90 tablet 1   fluticasone (FLONASE) 50 MCG/ACT nasal spray SHAKE LIQUID AND USE 2 SPRAYS IN EACH NOSTRIL DAILY 48 g 3   fluticasone (FLOVENT HFA) 110 MCG/ACT inhaler Inhale 2 puffs into the lungs 2 (two) times daily. 36 g 1   glipiZIDE (GLUCOTROL XL) 5 MG 24 hr tablet TAKE 2 TABLET(5 MG) BY MOUTH DAILY BEFORE BREAKFAST 180 tablet 3   glucose blood (CONTOUR NEXT TEST) test strip USE TO CHECK BLOOD SUGAR TWICE DAILY 200 strip 2   insulin glargine (LANTUS SOLOSTAR) 100 UNIT/ML Solostar Pen Inject 10-26 Units into the skin at bedtime. 15 mL 5   Insulin Pen Needle (PEN NEEDLES) 33G X 4 MM MISC Use 1x a day 100 each 3   levalbuterol (XOPENEX) 1.25 MG/3ML nebulizer solution USE ONE VIAL BY NEBULIZATION EVERY 6 HOURS AS NEEDED FOR WHEEZING OR SHORTNESS OF BREATH 675 mL 1   losartan (COZAAR) 100 MG tablet TAKE 1 TABLET(100 MG) BY MOUTH DAILY 90 tablet 1   Microlet Lancets MISC TEST BLOOD SUGAR TWICE DAILY 200 each 3   montelukast (SINGULAIR) 10 MG tablet TAKE 1 TABLET(10 MG) BY MOUTH AT BEDTIME 90 tablet 1   NONFORMULARY OR COMPOUNDED ITEM Cmp, lipid   Dx hyperlipidemia 1 each 0   NONFORMULARY OR COMPOUNDED ITEM Cmp, lipid--- dx htn and hyperlipidemia Microalbumin---  dx DM II 1 each 0   NONFORMULARY OR COMPOUNDED ITEM Hep C antibody--- need for hep c screen 1 each 0   Omega-3 Fatty Acids (FISH OIL) 1000 MG CAPS Take by mouth.     omeprazole (PRILOSEC) 40 MG capsule TAKE 1 CAPSULE(40 MG) BY MOUTH DAILY 90 capsule 3   Semaglutide, 1 MG/DOSE, (OZEMPIC, 1 MG/DOSE,) 4 MG/3ML SOPN Inject 1 mg into the skin once a week. 9 mL 3    vitamin C (ASCORBIC ACID) 500 MG tablet Take 500 mg by mouth daily.     No current facility-administered medications on file prior to visit.   No Known Allergies Family History  Problem Relation Age of Onset   Diabetes Mother    Hyperlipidemia Mother    Hypertension Mother    Diabetes Father    Hyperlipidemia Father    Hypertension Father     PE: BP 130/82 (BP Location: Right Arm, Patient Position: Sitting, Cuff Size: Normal)   Pulse 86   Ht $R'5\' 3"'MM$  (1.6 m)   Wt 153 lb (69.4 kg)   SpO2 98%   BMI 27.10 kg/m  Wt Readings from Last 3 Encounters:  10/20/21 153 lb (69.4 kg)  08/04/21 153 lb 8 oz (69.6 kg)  06/17/21 150 lb 12.8 oz (68.4 kg)   Constitutional: normal weight, in NAD Eyes: EOMI, no exophthalmos ENT: moist mucous membranes, no thyromegaly, no cervical lymphadenopathy Cardiovascular: RRR, No MRG Respiratory: CTA B Musculoskeletal: no deformities Skin: moist, warm, no rashes Neurological: no tremor with outstretched hands  ASSESSMENT: 1. DM2, non-insulin-dependent, uncontrolled, without long-term complications, but with hyperglycemia  2. HL  PLAN:  1. Patient with longstanding, uncontrolled, type 2 diabetes, on sulfonylurea, weekly GLP-1 receptor agonist and now basal insulin added at last visit.  She could not tolerate metformin due to nausea.  She could not tolerate SGLT2 inhibitors due to yeast infections.  At last visit, HbA1c was 7.1%, higher.  Sugars were higher, up to 200s.  No lows.  Due to previous intolerances, we added a low-dose of Lantus.  We also resubmitted a prescription for the Dexcom CGM, which  was previously denied by her insurance since she was not on insulin.  She was able to obtain the Dexcom G6.  We are trying to get her the [place]. CGM interpretation: -At today's visit, we reviewed her CGM downloads: It appears that 34% of values are in target range (goal >70%), while 66% are higher than 180 (goal <25%), and 0% are lower than 70 (goal <4%).  The  calculated average blood sugar is 202.  The projected HbA1c for the next 3 months (GMI) is 8.1%.  The sugars appear to have been worsening in the last month -Reviewing the CGM trends, it appears that her sugars are fluctuating around the upper limit of the target range, worse in the last 2 weeks.  Increase significantly after breakfast and then again after dinner.  Upon questioning, she is eating a high-fat breakfast, with eggs and bacon.  I expect that her insulin sensitivity continues to worsen and we discussed about decreasing fat in her diet to improve her insulin sensitivity.  Given examples of healthier breakfast.  She agrees with a referral to nutrition. -For now, I advised her to increase her Lantus dose and we also need to start mealtime insulin before breakfast and dinner, after which her sugars are very high, mostly in the 200s.  I am hoping that we will need to continue with this for long, but she absolutely needs an improvement in the diet to be able to come off.  We will also try to stop glipizide as we are adding Lyumjev. -At next visit, we need to check her for type 1 diabetes.  We could not do this today, since sugars are above 250 while in the office. - I suggested to:  Patient Instructions  Please continue: - Ozempic 1 mg weekly  Please increase: - Lantus 40 units at bedtime  Please schedule an appt with Antonieta Iba with nutrition.  Please start: - Lyumjev 6-10 units before b'fast and dinner  Try to stop Glipizide.  Please come back for a follow-up appointment in 3-4 months.  - we checked her HbA1c: 7.1% (stable, higher than expected from the CGM) - advised to check sugars at different times of the day - 4x a day, rotating check times - advised for yearly eye exams >> she is UTD - return to clinic in 3-4 months  2. HL -Reviewed latest lipid panel from 11/2020: All fractions at goal: Lab Results  Component Value Date   CHOL 129 11/19/2020   HDL 42 11/19/2020   LDLCALC  66 11/19/2020   TRIG 115 11/19/2020   CHOLHDL 3.1 11/19/2020  -She is on Lipitor 10 mg daily, fenofibrate 160 mg daily without side effects.  He is also on fish oil 1000 mg twice a day-for dry eyes.  LABS NEED TO GO TO [facility] -patient's husband works there.  Philemon Kingdom, MD PhD [facility]

## 2021-10-26 ENCOUNTER — Ambulatory Visit: Payer: 59 | Admitting: Family Medicine

## 2021-10-26 ENCOUNTER — Encounter: Payer: Self-pay | Admitting: Family Medicine

## 2021-10-26 VITALS — BP 122/80 | HR 96 | Temp 98.4°F | Resp 18 | Ht 63.0 in | Wt 154.2 lb

## 2021-10-26 DIAGNOSIS — J4520 Mild intermittent asthma, uncomplicated: Secondary | ICD-10-CM

## 2021-10-26 DIAGNOSIS — E785 Hyperlipidemia, unspecified: Secondary | ICD-10-CM

## 2021-10-26 DIAGNOSIS — J302 Other seasonal allergic rhinitis: Secondary | ICD-10-CM | POA: Diagnosis not present

## 2021-10-26 DIAGNOSIS — J45909 Unspecified asthma, uncomplicated: Secondary | ICD-10-CM

## 2021-10-26 DIAGNOSIS — E1165 Type 2 diabetes mellitus with hyperglycemia: Secondary | ICD-10-CM

## 2021-10-26 DIAGNOSIS — I10 Essential (primary) hypertension: Secondary | ICD-10-CM

## 2021-10-26 MED ORDER — EZETIMIBE 10 MG PO TABS: 10.0000 mg | ORAL_TABLET | Freq: Every day | ORAL | 1 refills | Status: DC

## 2021-10-26 MED ORDER — FLUTICASONE PROPIONATE 50 MCG/ACT NA SUSP
NASAL | 3 refills | Status: DC
Start: 2021-10-26 — End: 2022-03-10

## 2021-10-26 MED ORDER — MONTELUKAST SODIUM 10 MG PO TABS: ORAL_TABLET | ORAL | 1 refills | Status: DC

## 2021-10-26 MED ORDER — ALBUTEROL SULFATE HFA 108 (90 BASE) MCG/ACT IN AERS: 2.0000 | INHALATION_SPRAY | Freq: Four times a day (QID) | RESPIRATORY_TRACT | 1 refills | Status: DC | PRN

## 2021-10-26 MED ORDER — FENOFIBRATE 160 MG PO TABS: ORAL_TABLET | ORAL | 1 refills | Status: DC

## 2021-10-26 MED ORDER — NONFORMULARY OR COMPOUNDED ITEM: 0 refills | Status: AC

## 2021-10-26 MED ORDER — LEVALBUTEROL HCL 1.25 MG/3ML IN NEBU: INHALATION_SOLUTION | RESPIRATORY_TRACT | 1 refills | Status: DC

## 2021-10-26 MED ORDER — LOSARTAN POTASSIUM 100 MG PO TABS: ORAL_TABLET | ORAL | 1 refills | Status: DC

## 2021-10-26 MED ORDER — FLUTICASONE PROPIONATE HFA 110 MCG/ACT IN AERO: 2.0000 | INHALATION_SPRAY | Freq: Two times a day (BID) | RESPIRATORY_TRACT | 1 refills | Status: DC

## 2021-10-26 NOTE — Assessment & Plan Note (Signed)
Tolerating statin, encouraged heart healthy diet, avoid trans fats, minimize simple carbs and saturated fats. Increase exercise as tolerated 

## 2021-10-26 NOTE — Assessment & Plan Note (Signed)
Well controlled, no changes to meds. Encouraged heart healthy diet such as the DASH diet and exercise as tolerated.  °

## 2021-10-26 NOTE — Assessment & Plan Note (Signed)
Refill inhalers  stable

## 2021-10-26 NOTE — Assessment & Plan Note (Signed)
Per endo °

## 2021-10-26 NOTE — Progress Notes (Signed)
Subjective:   By signing my name below, I, Shehryar Baig, attest that this documentation has been prepared under the direction and in the presence of Ann Held, DO  10/26/2021    Patient ID: Dominique [name], female    DOB: 07-19-1960, 61 y.o.   MRN: [id number]  Chief Complaint  Patient presents with   Hypertension   Hyperlipidemia   Follow-up    Hypertension  Hyperlipidemia   Patient is in today for a follow up visit.   She is no longer receiving Botox injection in her eyebrows to correct the asymmetry from her bell palsy.  She has a dexcom device to measure her blood sugar. She started using Lantus insulin. She reports having constipation while taking jardiance.   Lab Results  Component Value Date   HGBA1C 7.1 (A) 10/20/2021   Her blood pressure is doing well during this visit. She continues taking 100 mg losartan daily PO and reports no new issues while taking it. She is requesting a refill on it as well.  BP Readings from Last 3 Encounters:  10/26/21 122/80  10/20/21 130/82  08/04/21 131/78   Pulse Readings from Last 3 Encounters:  10/26/21 96  10/20/21 86  08/04/21 89   She continues taking 160 mg fenofibrate daily PO, 10 mg Zetia daily PO and reports no new issues while taking it. She is requesting a refill on both of them as well.  Lab Results  Component Value Date   CHOL 129 11/19/2020   HDL 42 11/19/2020   LDLCALC 66 11/19/2020   TRIG 115 11/19/2020   CHOLHDL 3.1 11/19/2020   She continues using azelastine nasal spray, Flonase nasal spray, fluticasone inhaler, albuterol inhaler and reports no new issues while using them. She is requesting a refill on albuterol, fluticasone and Flonase as well.  She continues taking 10 mg Singulair and reports no new issues while taking it. She is requesting a refill on it as well.  She is willing to complete lab work and is requesting to [facility] it at [facility]    Past Medical History:  Diagnosis Date    Arthritis    left foot   Asthma    Bell palsy 2015   Diabetes mellitus without complication ([place])    type 2   Endometriosis    GERD (gastroesophageal reflux disease)    H/O hiatal hernia    Histoplasmosis 2002   history of, [place] raised   Hypertension    Kidney stone    Kidney stones     Past Surgical History:  Procedure Laterality Date   FOOT ARTHRODESIS Left 09/05/2019   Procedure: Left Modified McBride, Left First and Second Tarsometatarsal Joint Arthrodesis;  Surgeon: Wylene Simmer, MD;  Location: [facility];  Service: Orthopedics;  Laterality: Left;   laparatomy  1985   RADIAL HEAD ARTHROPLASTY Left 02/23/2013   Procedure: LEFT RADIAL HEAD ARTHROPLASTY qwith ligament reconstruction;  Surgeon: Roseanne Kaufman, MD;  Location: [facility];  Service: Orthopedics;  Laterality: Left;   TONSILLECTOMY AND ADENOIDECTOMY  1967   uterine lining ablation  2002    Family History  Problem Relation Age of Onset   Diabetes Mother    Hyperlipidemia Mother    Hypertension Mother    Diabetes Father    Hyperlipidemia Father    Hypertension Father     Social History   Socioeconomic History   Marital status: Married    Spouse name: Not on file   Number of children:  Not on file   Years of education: Not on file   Highest education level: Not on file  Occupational History    Comment: unemployed  Tobacco Use   Smoking status: Never   Smokeless tobacco: Never  Substance and Sexual Activity   Alcohol use: No   Drug use: No   Sexual activity: Yes    Partners: Male  Other Topics Concern   Not on file  Social History Narrative   Exercise-- no   Social Determinants of Health   Financial Resource Strain: Not on file  Food Insecurity: Not on file  Transportation Needs: Not on file  Physical Activity: Not on file  Stress: Not on file  Social Connections: Not on file  Intimate Partner Violence: Not on file    Outpatient Medications Prior to Visit  Medication Sig  Dispense Refill   albuterol (VENTOLIN HFA) 108 (90 Base) MCG/ACT inhaler INHALE 2 PUFFS INTO THE LUNGS EVERY 6 HOURS AS NEEDED FOR WHEEZING OR SHORTNESS OF BREATH 20.1 g 1   azelastine (ASTELIN) 0.1 % nasal spray Place 1-2 sprays into both nostrils 2 (two) times daily. Use in each nostril as directed 30 mL 0   blood glucose meter kit and supplies Dispense based on patient and insurance preference. Use up to four times daily as directed. (FOR ICD-10 E10.9, E11.9). 1 each 0   Blood Glucose Monitoring Suppl (CONTOUR NEXT ONE) KIT 1 kit by Does not apply route See admin instructions. For checking blood sugar 2 times a day 1 kit 0   Cholecalciferol (VITAMIN D3 PO) Take 50 mcg by mouth daily.     Continuous Blood Gluc Sensor (DEXCOM G7 SENSOR) MISC 3 each by Does not apply route every 30 (thirty) days. Apply 1 sensor every 10 days 9 each 4   ezetimibe (ZETIA) 10 MG tablet Take 1 tablet (10 mg total) by mouth daily. 90 tablet 1   fenofibrate 160 MG tablet TAKE 1 TABLET(160 MG) BY MOUTH DAILY 90 tablet 1   fluticasone (FLONASE) 50 MCG/ACT nasal spray SHAKE LIQUID AND USE 2 SPRAYS IN EACH NOSTRIL DAILY 48 g 3   fluticasone (FLOVENT HFA) 110 MCG/ACT inhaler Inhale 2 puffs into the lungs 2 (two) times daily. 36 g 1   glucose blood (CONTOUR NEXT TEST) test strip USE TO CHECK BLOOD SUGAR TWICE DAILY 200 strip 2   insulin glargine (LANTUS SOLOSTAR) 100 UNIT/ML Solostar Pen Inject 40 Units into the skin at bedtime. 30 mL 3   Insulin Lispro-aabc (LYUMJEV KWIKPEN) 100 UNIT/ML KwikPen Inject 6-10 Units into the skin 2 (two) times daily before a meal. 15 mL 11   Insulin Pen Needle (PEN NEEDLES) 33G X 4 MM MISC Use 3x a day 300 each 3   levalbuterol (XOPENEX) 1.25 MG/3ML nebulizer solution USE ONE VIAL BY NEBULIZATION EVERY 6 HOURS AS NEEDED FOR WHEEZING OR SHORTNESS OF BREATH 675 mL 1   losartan (COZAAR) 100 MG tablet TAKE 1 TABLET(100 MG) BY MOUTH DAILY 90 tablet 1   Microlet Lancets MISC TEST BLOOD SUGAR TWICE DAILY  200 each 3   montelukast (SINGULAIR) 10 MG tablet TAKE 1 TABLET(10 MG) BY MOUTH AT BEDTIME 90 tablet 1   NONFORMULARY OR COMPOUNDED ITEM Cmp, lipid   Dx hyperlipidemia 1 each 0   NONFORMULARY OR COMPOUNDED ITEM Cmp, lipid--- dx htn and hyperlipidemia Microalbumin---  dx DM II 1 each 0   NONFORMULARY OR COMPOUNDED ITEM Hep C antibody--- need for hep c screen 1 each 0   Omega-3  Fatty Acids (FISH OIL) 1000 MG CAPS Take by mouth.     omeprazole (PRILOSEC) 40 MG capsule TAKE 1 CAPSULE(40 MG) BY MOUTH DAILY 90 capsule 3   Semaglutide, 1 MG/DOSE, (OZEMPIC, 1 MG/DOSE,) 4 MG/3ML SOPN Inject 1 mg into the skin once a week. 9 mL 3   vitamin C (ASCORBIC ACID) 500 MG tablet Take 500 mg by mouth daily.     Continuous Blood Gluc Receiver ([place]) [place] 1 each by Does not apply route once for 1 dose. 1 each 0   No facility-administered medications prior to visit.    No Known Allergies  ROS     Objective:    Physical Exam Constitutional:      General: She is not in acute distress.    Appearance: Normal appearance. She is not ill-appearing.  HENT:     Head: Normocephalic and atraumatic.     Right Ear: External ear normal.     Left Ear: External ear normal.  Eyes:     Extraocular Movements: Extraocular movements intact.     Pupils: Pupils are equal, round, and reactive to light.  Cardiovascular:     Rate and Rhythm: Normal rate and regular rhythm.     Heart sounds: Normal heart sounds. No murmur heard.    No gallop.  Pulmonary:     Effort: Pulmonary effort is normal. No respiratory distress.     Breath sounds: Normal breath sounds. No wheezing or rales.  Skin:    General: Skin is warm and dry.  Neurological:     Mental Status: She is alert and oriented to person, place, and time.  Psychiatric:        Judgment: Judgment normal.     BP 122/80 (BP Location: Left Arm, Patient Position: Sitting, Cuff Size: Normal)   Pulse 96   Temp 98.4 F (36.9 C) (Oral)   Resp 18   Ht $R'5\' 3"'GR$   (1.6 m)   Wt 154 lb 3.2 oz (69.9 kg)   SpO2 97%   BMI 27.32 kg/m  Wt Readings from Last 3 Encounters:  10/26/21 154 lb 3.2 oz (69.9 kg)  10/20/21 153 lb (69.4 kg)  08/04/21 153 lb 8 oz (69.6 kg)    Diabetic Foot Exam - Simple   No data filed    Lab Results  Component Value Date   WBC 4.9 03/24/2014   HGB 16.5 (A) 03/24/2014   HCT 48 (A) 03/24/2014   PLT 166 03/24/2014   GLUCOSE 146 (H) 12/04/2020   CHOL 129 11/19/2020   TRIG 115 11/19/2020   HDL 42 11/19/2020   LDLCALC 66 11/19/2020   ALT 49 (H) 12/04/2020   AST 41 (H) 12/04/2020   NA 142 12/04/2020   K 4.9 12/04/2020   CL 105 12/04/2020   CREATININE 0.78 12/04/2020   BUN 11 12/04/2020   CO2 23 12/04/2020   TSH 1.56 03/24/2014   HGBA1C 7.1 (A) 10/20/2021    Lab Results  Component Value Date   TSH 1.56 03/24/2014   Lab Results  Component Value Date   WBC 4.9 03/24/2014   HGB 16.5 (A) 03/24/2014   HCT 48 (A) 03/24/2014   MCV 86.4 02/21/2013   PLT 166 03/24/2014   Lab Results  Component Value Date   NA 142 12/04/2020   K 4.9 12/04/2020   CO2 23 12/04/2020   GLUCOSE 146 (H) 12/04/2020   BUN 11 12/04/2020   CREATININE 0.78 12/04/2020   BILITOT 1.0 12/04/2020   ALKPHOS 82  12/04/2020   AST 41 (H) 12/04/2020   ALT 49 (H) 12/04/2020   PROT 6.9 12/04/2020   ALBUMIN 4.8 12/04/2020   CALCIUM 9.8 12/04/2020   EGFR 86 12/04/2020   Lab Results  Component Value Date   CHOL 129 11/19/2020   Lab Results  Component Value Date   HDL 42 11/19/2020   Lab Results  Component Value Date   LDLCALC 66 11/19/2020   Lab Results  Component Value Date   TRIG 115 11/19/2020   Lab Results  Component Value Date   CHOLHDL 3.1 11/19/2020   Lab Results  Component Value Date   HGBA1C 7.1 (A) 10/20/2021       Assessment & Plan:   Problem List Items Addressed This Visit       Cardiovascular and Mediastinum   Essential hypertension   Relevant Orders   CBC with Differential/Platelet   Comprehensive  metabolic panel   Lipid panel     Other   Hyperlipidemia - Primary   Relevant Orders   CBC with Differential/Platelet   Comprehensive metabolic panel   Lipid panel     No orders of the defined types were placed in this encounter.   I, Shehryar [place], personally preformed the services described in this documentation.  All medical record entries made by the scribe were at my direction and in my presence.  I have reviewed the chart and discharge instructions (if applicable) and agree that the record reflects my personal performance and is accurate and complete. 10/26/2021   I,Shehryar Baig,acting as a Education administrator for [employer], DO.,have documented all relevant documentation on the behalf of Ann Held, DO,as directed by  Ann Held, DO while in the presence of Ann Held, DO.   Shehryar [employer]

## 2021-10-26 NOTE — Patient Instructions (Signed)

## 2021-10-27 ENCOUNTER — Other Ambulatory Visit: Payer: Self-pay | Admitting: Family Medicine

## 2021-10-28 LAB — LIPID PANEL W/O CHOL/HDL RATIO
Cholesterol, Total: 136 mg/dL (ref 100–199)
HDL: 48 mg/dL (ref >39)
LDL Chol Calc (NIH): 69 mg/dL (ref 0–99)
Triglycerides: 102 mg/dL (ref 0–149)
VLDL Cholesterol Cal: 19 mg/dL (ref 5–40)

## 2021-10-28 LAB — CBC WITH DIFFERENTIAL/PLATELET
Basophils Absolute: 0 10*3/uL (ref 0.0–0.2)
Basos: 1 %
EOS (ABSOLUTE): 0.1 10*3/uL (ref 0.0–0.4)
Eos: 1 %
Hematocrit: 47 % — ABNORMAL HIGH (ref 34.0–46.6)
Hemoglobin: 15.9 g/dL (ref 11.1–15.9)
Immature Grans (Abs): 0 10*3/uL (ref 0.0–0.1)
Immature Granulocytes: 0 %
Lymphocytes Absolute: 1.8 10*3/uL (ref 0.7–3.1)
Lymphs: 34 %
MCH: 30.4 pg (ref 26.6–33.0)
MCHC: 33.8 g/dL (ref 31.5–35.7)
MCV: 90 fL (ref 79–97)
Monocytes Absolute: 0.4 10*3/uL (ref 0.1–0.9)
Monocytes: 7 %
Neutrophils Absolute: 3 10*3/uL (ref 1.4–7.0)
Neutrophils: 57 %
Platelets: 151 10*3/uL (ref 150–450)
RBC: 5.23 x10E6/uL (ref 3.77–5.28)
RDW: 12.7 % (ref 11.7–15.4)
WBC: 5.3 10*3/uL (ref 3.4–10.8)

## 2021-10-28 LAB — COMPREHENSIVE METABOLIC PANEL
ALT: 46 IU/L — ABNORMAL HIGH (ref 0–32)
AST: 35 IU/L (ref 0–40)
Albumin/Globulin Ratio: 2.1 (ref 1.2–2.2)
Albumin: 4.9 g/dL — ABNORMAL HIGH (ref 3.8–4.8)
Alkaline Phosphatase: 101 IU/L (ref 44–121)
BUN/Creatinine Ratio: 16 (ref 12–28)
BUN: 13 mg/dL (ref 8–27)
Bilirubin Total: 0.9 mg/dL (ref 0.0–1.2)
CO2: 22 mmol/L (ref 20–29)
Calcium: 10.4 mg/dL — ABNORMAL HIGH (ref 8.7–10.3)
Chloride: 101 mmol/L (ref 96–106)
Creatinine, Ser: 0.8 mg/dL (ref 0.57–1.00)
Globulin, Total: 2.3 g/dL (ref 1.5–4.5)
Glucose: 152 mg/dL — ABNORMAL HIGH (ref 70–99)
Potassium: 4.6 mmol/L (ref 3.5–5.2)
Sodium: 142 mmol/L (ref 134–144)
Total Protein: 7.2 g/dL (ref 6.0–8.5)
eGFR: 83 mL/min/{1.73_m2} (ref >59)

## 2021-11-02 ENCOUNTER — Encounter: Payer: Self-pay | Admitting: Family Medicine

## 2021-11-03 ENCOUNTER — Encounter: Payer: Self-pay | Admitting: Family Medicine

## 2021-11-10 ENCOUNTER — Encounter: Payer: Self-pay | Admitting: Internal Medicine

## 2021-11-10 MED ORDER — LANTUS SOLOSTAR 100 UNIT/ML ~~LOC~~ SOPN
48.0000 [IU] | PEN_INJECTOR | Freq: Every day | SUBCUTANEOUS | 3 refills | Status: DC
Start: 2021-11-10 — End: 2022-03-10

## 2021-11-11 ENCOUNTER — Other Ambulatory Visit: Payer: Self-pay | Admitting: Family Medicine

## 2021-11-11 DIAGNOSIS — E785 Hyperlipidemia, unspecified: Secondary | ICD-10-CM

## 2021-11-23 ENCOUNTER — Other Ambulatory Visit: Payer: Self-pay | Admitting: Family Medicine

## 2021-11-23 DIAGNOSIS — J302 Other seasonal allergic rhinitis: Secondary | ICD-10-CM

## 2021-11-29 ENCOUNTER — Encounter (INDEPENDENT_AMBULATORY_CARE_PROVIDER_SITE_OTHER): Payer: 59 | Admitting: Internal Medicine

## 2021-11-29 DIAGNOSIS — E1165 Type 2 diabetes mellitus with hyperglycemia: Secondary | ICD-10-CM

## 2021-11-30 NOTE — Telephone Encounter (Addendum)
    CGM interpretation: -At today's visit, we reviewed her CGM downloads: It appears that 30% of values are in target range (goal >70%), while 62% are higher than 180 (goal <25%), and 0% are lower than 70 (goal <4%).  The calculated average blood sugar is 192.  The projected HbA1c for the next 3 months (GMI) is 7.9%. -Reviewing the CGM trends, it appears that sugars appear to have improved significantly after meals, but there are still some increased blood sugars after breakfast and dinner.  Per my records, she is on: -Lantus 48 units daily -Lyumjev 6-8 units before breakfast; 10 to 12 units before supper -Based on the pattern of the sugars, she still appears to need more basal insulin.  I will advise her to increase the dose from 48 to 54 units daily. -Going forward, she would probably do well on an insulin pump.  We will discuss at next visit.  Please see the MyChart message reply(ies) for my assessment and plan.    This patient gave consent for this Medical Advice Message and is aware that it may result in a bill to [employer], as well as the possibility of receiving a bill for a co-payment or deductible. They are an established patient, but are not seeking medical advice exclusively about a problem treated during an in person or video visit in the last seven days. I did not recommend an in person or video visit within seven days of my reply.    I spent a total of 10 minutes cumulative time within 7 days through [employer].  Carlus Pavlov, MD

## 2021-11-30 NOTE — Telephone Encounter (Signed)
Addressed another encounter.

## 2021-12-09 ENCOUNTER — Encounter: Payer: Self-pay | Admitting: Internal Medicine

## 2021-12-09 DIAGNOSIS — E1165 Type 2 diabetes mellitus with hyperglycemia: Secondary | ICD-10-CM

## 2021-12-10 MED ORDER — DEXCOM G7 SENSOR MISC: 3.0000 | 4 refills | Status: DC

## 2021-12-15 ENCOUNTER — Encounter: Payer: Self-pay | Admitting: Internal Medicine

## 2021-12-26 ENCOUNTER — Other Ambulatory Visit: Payer: Self-pay | Admitting: Family Medicine

## 2021-12-26 DIAGNOSIS — J4520 Mild intermittent asthma, uncomplicated: Secondary | ICD-10-CM

## 2022-01-15 ENCOUNTER — Other Ambulatory Visit: Payer: Self-pay | Admitting: Internal Medicine

## 2022-01-23 ENCOUNTER — Other Ambulatory Visit: Payer: Self-pay | Admitting: Family Medicine

## 2022-01-23 DIAGNOSIS — J302 Other seasonal allergic rhinitis: Secondary | ICD-10-CM

## 2022-01-23 DIAGNOSIS — I10 Essential (primary) hypertension: Secondary | ICD-10-CM

## 2022-02-07 ENCOUNTER — Encounter: Payer: Self-pay | Admitting: Internal Medicine

## 2022-02-07 ENCOUNTER — Ambulatory Visit: Payer: 59 | Admitting: Internal Medicine

## 2022-02-07 VITALS — BP 146/78 | HR 92 | Ht 63.0 in | Wt 155.8 lb

## 2022-02-07 DIAGNOSIS — Z794 Long term (current) use of insulin: Secondary | ICD-10-CM | POA: Diagnosis not present

## 2022-02-07 DIAGNOSIS — E1165 Type 2 diabetes mellitus with hyperglycemia: Secondary | ICD-10-CM

## 2022-02-07 DIAGNOSIS — E785 Hyperlipidemia, unspecified: Secondary | ICD-10-CM | POA: Diagnosis not present

## 2022-02-07 LAB — POCT GLYCOSYLATED HEMOGLOBIN (HGB A1C): Hemoglobin A1C: 7.1 % — AB (ref 4.0–5.6)

## 2022-02-07 NOTE — Progress Notes (Addendum)
Patient ID: Dominique [name], female   DOB: 09-04-1960, 61 y.o.   MRN: [id number]   HPI: Dominique [name] is a 61 y.o.-year-old female, returning for follow-up for DM2, dx in 2016, but GDM in 2000, insulin-dependent, uncontrolled, without long term complications.  She previously saw Dr. Loanne [name].  Last visit with me 3.5 months ago.  Interim history: She has increased urination and blurry vision.  She also has weight gain (per our scale, she gained 2 pounds since last visit).  She continues to not sleep well and has fatigue.   When sugars are high, she has dizziness and nausea.  Reviewed HbA1c levels: Lab Results  Component Value Date   HGBA1C 7.1 (A) 10/20/2021   HGBA1C 7.1 (A) 06/17/2021   HGBA1C 6.6 (A) 02/12/2021   HGBA1C 6.7 (A) 10/07/2020   HGBA1C 6.7 (A) 06/04/2020   HGBA1C 6.0 (H) 11/25/2019   HGBA1C 6.3 (A) 06/19/2019   HGBA1C 7.1 (A) 02/12/2019   HGBA1C 6.6 (A) 10/12/2018   HGBA1C 5.9 (A) 06/13/2018  HbA1c 8.9%  She is on: -  >> stopped 10/2021 - Ozempic 0.5 >> 1 mg weekly  (had nausea with 2 mg dose) - Lantus 10 units in HS - added 06/2021 >> 34 >> 40 >> 48 units in HS - Lyumjev 6-10 units before B and D - added 10/2021 We had to stop Jardiance 07/2018 due to persistent yeast infections. She stopped metformin IR due to lack of effect.  We had to stop metformin ER due to nausea. She developed a rash with Trulicity She was previously on nateglinide and also on repaglinide.  Pt checks her sugars  >4x a day with her Dexcom G7 CGM:  Prev.:   Prev.: - am: 119, 123-159 >> 121-152 >> 131-154, 165 >> 135-205, 232 - 2h after b'fast: 78, 152 >> n/c >> 135 >> n/c >> 198 >> n/c - before lunch: 97 >> 105 >> n/c >> 138 - 2h after lunch:  170 >> n/c >> 143, 200 >> n/c >> 151 - before dinner: 147 >> n/c - 2h after dinner: 211 >> n/c >> 205 >> 165-198 >> 207, 251 - bedtime: n/c - nighttime: n/c Lowest sugar was 78 ...  >> 131 >> 135 >> 125 >> 82; she has hypoglycemia  awareness in the 90s. Highest sugar was 205 >> 198 >> 251 >> 300 >> 320  Glucometer: Accu-Chek guide  Pt's meals are: - Breakfast: 1-2 egg, 1-2 toast; egg + bacon - Lunch: eating out, salad, meat + veggies - Dinner: eating out, salad, meat + veggies - Dessert: apple pie, cake - Snacks: chips  -No CKD, last BUN/creatinine:  Lab Results  Component Value Date   BUN 13 10/27/2021   BUN 11 12/04/2020   CREATININE 0.80 10/27/2021   CREATININE 0.78 12/04/2020  On losartan 100.  -+ HL; last set of lipids: Lab Results  Component Value Date   CHOL 136 10/27/2021   HDL 48 10/27/2021   LDLCALC 69 10/27/2021   TRIG 102 10/27/2021   CHOLHDL 3.1 11/19/2020  On Lipitor 10, fenofibrate 160, fish oil 1000 mg 2x a day.  - last eye exam was in 07/2021: No DR; Dr. Sabra [name]. Sees an ophthalmic surgeon >> contemplates palpebral surgery. On Xiidra and fish oil for dry eyes.  -No numbness and tingling in her feet.  She saw neurology -no peripheral neuropathy.  Last foot exam 02/12/2021.  Off Gabapentin - prev. For Bell's palsy. She has a history of a kidney stone  in 2019.  Pt has FH of DM in mother and father.  ROS: + see HPI Neurological: no tremors/+ numbness - top of L foot/no tingling/no dizziness  I reviewed pt's medications, allergies, PMH, social hx, family hx, and changes were documented in the history of present illness. Otherwise, unchanged from my initial visit note.  Past Medical History:  Diagnosis Date   Arthritis    left foot   Asthma    Bell palsy 2015   Diabetes mellitus without complication ([place])    type 2   Endometriosis    GERD (gastroesophageal reflux disease)    H/O hiatal hernia    Histoplasmosis 2002   history of, [place] raised   Hypertension    Kidney stone    Kidney stones    Past Surgical History:  Procedure Laterality Date   FOOT ARTHRODESIS Left 09/05/2019   Procedure: Left Modified [place], Left First and Second Tarsometatarsal Joint Arthrodesis;   Surgeon: Dominique Simmer, MD;  Location: [facility];  Service: Orthopedics;  Laterality: Left;   laparatomy  1985   RADIAL HEAD ARTHROPLASTY Left 02/23/2013   Procedure: LEFT RADIAL HEAD ARTHROPLASTY qwith ligament reconstruction;  Surgeon: Roseanne Kaufman, MD;  Location: [facility];  Service: Orthopedics;  Laterality: Left;   TONSILLECTOMY AND ADENOIDECTOMY  1967   uterine lining ablation  2002   Social History   Socioeconomic History   Marital status: Married    Spouse name: Not on file   Number of children: Not on file   Years of education: Not on file   Highest education level: Not on file  Occupational History    Comment: unemployed  Tobacco Use   Smoking status: Never   Smokeless tobacco: Never  Substance and Sexual Activity   Alcohol use: No   Drug use: No   Sexual activity: Yes    Partners: Male  Other Topics Concern   Not on file  Social History Narrative   Exercise-- no   Social Determinants of Health   Financial Resource Strain: Not on file  Food Insecurity: Not on file  Transportation Needs: Not on file  Physical Activity: Not on file  Stress: Not on file  Social Connections: Not on file  Intimate Partner Violence: Not on file   Current Outpatient Medications on File Prior to Visit  Medication Sig Dispense Refill   albuterol (VENTOLIN HFA) 108 (90 Base) MCG/ACT inhaler INHALE 2 PUFFS INTO THE LUNGS EVERY 6 HOURS AS NEEDED FOR WHEEZING OR SHORTNESS OF BREATH 20.1 g 1   blood glucose meter kit and supplies Dispense based on patient and insurance preference. Use up to four times daily as directed. (FOR ICD-10 E10.9, E11.9). 1 each 0   Blood Glucose Monitoring Suppl (CONTOUR NEXT ONE) KIT 1 kit by Does not apply route See admin instructions. For checking blood sugar 2 times a day 1 kit 0   Cholecalciferol (VITAMIN D3 PO) Take 50 mcg by mouth daily.     Continuous Blood Gluc Receiver ([place]) Dominique [name] 1 each by Does not apply route once for 1 dose. 1  each 0   Continuous Blood Gluc Sensor (DEXCOM G7 SENSOR) MISC 3 each by Does not apply route every 30 (thirty) days. Apply 1 sensor every 10 days 9 each 4   ezetimibe (ZETIA) 10 MG tablet TAKE 1 TABLET(10 MG) BY MOUTH DAILY 90 tablet 1   fenofibrate 160 MG tablet 1 po qd 90 tablet 1   FLOVENT HFA 110 MCG/ACT inhaler INHALE  2 PUFFS INTO THE LUNGS TWICE DAILY 12 g 1   fluticasone (FLONASE) 50 MCG/ACT nasal spray SHAKE LIQUID AND USE 2 SPRAYS IN EACH NOSTRIL DAILY 48 g 3   glucose blood (CONTOUR NEXT TEST) test strip USE TO CHECK BLOOD SUGAR TWICE DAILY 200 strip 2   insulin glargine (LANTUS SOLOSTAR) 100 UNIT/ML Solostar Pen Inject 48 Units into the skin at bedtime. 45 mL 3   Insulin Lispro-aabc (LYUMJEV KWIKPEN) 100 UNIT/ML KwikPen Inject 6-10 Units into the skin 2 (two) times daily before a meal. 15 mL 11   Insulin Pen Needle (PEN NEEDLES) 33G X 4 MM MISC Use 3x a day 300 each 3   levalbuterol (XOPENEX) 1.25 MG/3ML nebulizer solution USE ONE VIAL BY NEBULIZATION EVERY 6 HOURS AS NEEDED FOR WHEEZING OR SHORTNESS OF BREATH 675 mL 1   losartan (COZAAR) 100 MG tablet Take 1 tablet (100 mg total) by mouth daily. 90 tablet 1   Microlet Lancets MISC TEST BLOOD SUGAR TWICE DAILY 200 each 3   montelukast (SINGULAIR) 10 MG tablet Take 1 tablet (10 mg total) by mouth at bedtime. 90 tablet 1   NONFORMULARY OR COMPOUNDED ITEM Cmp, lipid   Dx hyperlipidemia 1 each 0   NONFORMULARY OR COMPOUNDED ITEM Cmp, lipid--- dx htn and hyperlipidemia Microalbumin---  dx DM II 1 each 0   NONFORMULARY OR COMPOUNDED ITEM Hep C antibody--- need for hep c screen 1 each 0   NONFORMULARY OR COMPOUNDED ITEM Cbcd , cmp, lipid----- dx hyperlipidemia, htn, dm II 1 each 0   Omega-3 Fatty Acids (FISH OIL) 1000 MG CAPS Take by mouth.     omeprazole (PRILOSEC) 40 MG capsule TAKE 1 CAPSULE(40 MG) BY MOUTH DAILY 90 capsule 3   OZEMPIC, 1 MG/DOSE, 4 MG/3ML SOPN INJECT 1MG INTO THE SKIN ONCE A WEEK 9 mL 3   vitamin C (ASCORBIC ACID) 500  MG tablet Take 500 mg by mouth daily.     No current facility-administered medications on file prior to visit.   No Known Allergies Family History  Problem Relation Age of Onset   Diabetes Mother    Hyperlipidemia Mother    Hypertension Mother    Diabetes Father    Hyperlipidemia Father    Hypertension Father     PE: BP (!) 146/78 (BP Location: Left Arm, Patient Position: Sitting, Cuff Size: Normal)   Pulse 92   Ht 5' 3" (1.6 m)   Wt 155 lb 12.8 oz (70.7 kg)   SpO2 97%   BMI 27.60 kg/m  Wt Readings from Last 3 Encounters:  02/07/22 155 lb 12.8 oz (70.7 kg)  10/26/21 154 lb 3.2 oz (69.9 kg)  10/20/21 153 lb (69.4 kg)   Constitutional: normal weight but truncal adipose disposition, in NAD Eyes: EOMI, no exophthalmos ENT: no thyromegaly, no cervical lymphadenopathy Cardiovascular: RRR, No MRG Respiratory: CTA B Musculoskeletal: no deformities Skin: moist, warm, no rashes Neurological: no tremor with outstretched hands Diabetic Foot Exam - Simple   Simple Foot Form Diabetic Foot exam was performed with the following findings: Yes 02/07/2022  8:45 AM  Visual Inspection No deformities, no ulcerations, no other skin breakdown bilaterally: Yes Sensation Testing Intact to touch and monofilament testing bilaterally: Yes Pulse Check Posterior Tibialis and Dorsalis pulse intact bilaterally: Yes Comments    ASSESSMENT: 1. DM2, non-insulin-dependent, uncontrolled, without long-term complications, but with hyperglycemia  2. HL  PLAN:  1. Patient with longstanding, uncontrolled, type 2 diabetes, on with the GLP-1 receptor agonist and also basal/bolus insulin  regimen with sulfonylurea replaced by mealtime insulin at last visit.  Of note, we could not increase the dose of Ozempic further due to GI side effects.  Also, she could not tolerate metformin due to nausea or SGLT2 inhibitors due to yeast infections. -At last visit, sugars were fluctuating around the upper limit of the  target range with hyperglycemic spikes after breakfast and dinner.  I advised her to stop glipizide and start Lyumjev before these 2 meals. -At last visit sugars were >250 in the office.  We discussed about checking her for type 1 diabetes when sugars improve CGM interpretation: -At today's visit, we reviewed her CGM downloads: It appears that 52% of values are in target range (goal >70%), while 48% are higher than 180 (goal <25%), and 0% are lower than 70 (goal <4%).  The calculated average blood sugar is 185.  The projected HbA1c for the next 3 months (GMI) is 7.7%. -Reviewing the CGM trends, sugars appear to be increasing after all 3 meals of the day, but slightly improved after breakfast and dinner after the addition of Lyumjev.  Per review of individual daily traces, the sugars are now mostly increasing after lunch and occasionally after breakfast and dinner. -At today's visit, we discussed about the need to take more Lyumjev before breakfast and dinner and to introduce Lyumjev before lunch.  The Lantus dose appears to be adequate -his blood sugars overnight are at goal. -We also discussed about the need for an insulin pump.  I suggested a mechanical pump, or, as a second line, regular insulin pump.  Discussed about each of the mechanical insulin pumps (VGo versus CeQur simplicity): Advantages, particularities.  We also discussed about the rest of the insulin pumps, and she seems to prefer a tubeless pump, ideally worn outside the abdomen.  OmniPod 5 seems to be the most adequate for her. -We also need to check her for type 1 diabetes.  Sugars in the office today are higher than 250 after she ate at [place] this morning.  We will have her return fasting for these. - I suggested to:  Patient Instructions  Please continue: - Ozempic 1 mg weekly - Lantus 48 units at bedtime  Increase: - Lyumjev 10-15 units before b'fast, lunch, dinner  Please check with your insurance if an insulin pump is  covered: - Omnipod 5 - t:slim - Medtronic  Look up mechanical pump: - CeQur Simplicity - VGo  Please come back for labs (Glu needs to be <180).  Please come back for a follow-up appointment in 3-4 months.  - we checked her HbA1c: 7.1% (lower than expected from her CGM) - advised to check sugars at different times of the day - 4x a day, rotating check times - advised for yearly eye exams >> she is UTD - return to clinic in 3-4 months  2. HL -Reviewed latest lipid panel from 10/2021: All fractions at goal: Lab Results  Component Value Date   CHOL 136 10/27/2021   HDL 48 10/27/2021   LDLCALC 69 10/27/2021   TRIG 102 10/27/2021   CHOLHDL 3.1 11/19/2020  -She is on Lipitor 10 mg daily, fenofibrate 160 mg daily, without side effects.  She is also on fish oil 1000 mg twice a day, but this is for dry eyes.  LABS NEED TO GO TO [facility] -patient's husband works there.  Total time spent for the visit: 40 min, in precharting, reviewing her insulin doses, discussing her  hyper-glycemic episodes, reviewing previous labs, discussing about  different insulin pumps, and developing a plan to avoid hypo- and hyper-glycemia.   Component     Latest Ref Rng 02/09/2022  Glutamic Acid Decarb Ab     0.0 - 5.0 U/mL <5.0   ZNT8 Antibodies     U/mL <15   Glucose, Plasma     70 - 99 mg/dL 123 (H)   C-Peptide     1.1 - 4.4 ng/mL 4.2     No evidence of pancreatic autoimmunity or insulin deficiency.  Philemon Kingdom, MD PhD [facility] Endocrinology

## 2022-02-07 NOTE — Patient Instructions (Addendum)
Please continue: - Ozempic 1 mg weekly - Lantus 48 units at bedtime  Increase: - Lyumjev 10-15 units before b'fast, lunch, dinner  Please check with your insurance if an insulin pump is covered: - Omnipod 5 - t:slim - Medtronic  Look up mechanical pump: - CeQur Simplicity - VGo  Please come back for labs (Glu needs to be <180).  Please come back for a follow-up appointment in 3-4 months.

## 2022-02-09 ENCOUNTER — Telehealth: Payer: Self-pay | Admitting: Internal Medicine

## 2022-02-09 ENCOUNTER — Encounter: Payer: Self-pay | Admitting: Internal Medicine

## 2022-02-09 ENCOUNTER — Other Ambulatory Visit (INDEPENDENT_AMBULATORY_CARE_PROVIDER_SITE_OTHER): Payer: 59

## 2022-02-09 DIAGNOSIS — Z794 Long term (current) use of insulin: Secondary | ICD-10-CM | POA: Diagnosis not present

## 2022-02-09 DIAGNOSIS — E1165 Type 2 diabetes mellitus with hyperglycemia: Secondary | ICD-10-CM | POA: Diagnosis not present

## 2022-02-09 NOTE — Telephone Encounter (Signed)
Pt sent mychart message for follow up.

## 2022-02-09 NOTE — Telephone Encounter (Signed)
Patient is here for labs this morning and says that she is having trouble getting her insurance to approve her pump and she would like to speak with someone to see what she needs to do.

## 2022-02-10 ENCOUNTER — Other Ambulatory Visit: Payer: Self-pay | Admitting: Internal Medicine

## 2022-02-10 DIAGNOSIS — E1165 Type 2 diabetes mellitus with hyperglycemia: Secondary | ICD-10-CM

## 2022-02-10 MED ORDER — LYUMJEV 100 UNIT/ML IJ SOLN: INTRAMUSCULAR | 5 refills | Status: DC

## 2022-02-10 MED ORDER — V-GO 40 40 UNIT/24HR KIT: 1.0000 | PACK | Freq: Every day | 11 refills | Status: DC

## 2022-02-15 ENCOUNTER — Encounter: Payer: Self-pay | Admitting: Internal Medicine

## 2022-02-17 ENCOUNTER — Telehealth: Payer: Self-pay | Admitting: Dietician

## 2022-02-17 NOTE — Telephone Encounter (Signed)
Called patient and arranged an appointment for 10/9 for V-go training.  Patient is to bring insulin, V-go's and supplies to fill pods.  Antonieta Iba, RD, LDN, CDCES

## 2022-02-18 ENCOUNTER — Other Ambulatory Visit: Payer: Self-pay | Admitting: Internal Medicine

## 2022-02-18 ENCOUNTER — Telehealth: Payer: Self-pay | Admitting: Dietician

## 2022-02-18 ENCOUNTER — Encounter: Payer: Self-pay | Admitting: Internal Medicine

## 2022-02-18 MED ORDER — OMNIPOD 5 DEXG7G6 INTRO GEN 5 KIT: 1.0000 | PACK | 0 refills | Status: DC | PRN

## 2022-02-18 MED ORDER — OMNIPOD 5 DEXG7G6 PODS GEN 5 MISC: 1.0000 | 3 refills | Status: DC

## 2022-02-18 NOTE — Telephone Encounter (Signed)
Called patient. She has a month's supply of V-go but insurance will cover the Omnipod 5 and she has decided to use the Omnipod 5 with her Dexcom. She has the Pods and is waiting on the Starter kit from the Pharmacy. She is to call us for a training appointment when she receives the pump.  Antonieta Iba, RD, LDN, CDCES

## 2022-02-18 NOTE — Telephone Encounter (Signed)
Pt contacted and advised PA initiated for Omni pod 5. Pt advised Omni pod welcome form will be sent out today for her to have as a reference for her training appointment.

## 2022-02-20 LAB — ZNT8 ANTIBODIES: ZNT8 Antibodies: <15 U/mL

## 2022-02-20 LAB — C-PEPTIDE: C-Peptide: 4.2 ng/mL (ref 1.1–4.4)

## 2022-02-20 LAB — GLUTAMIC ACID DECARBOXYLASE AUTO ABS: Glutamic Acid Decarb Ab: <5 U/mL (ref 0.0–5.0)

## 2022-02-20 LAB — GLUCOSE, FASTING: Glucose, Plasma: 123 mg/dL — ABNORMAL HIGH (ref 70–99)

## 2022-02-21 ENCOUNTER — Ambulatory Visit: Payer: 59 | Admitting: Dietician

## 2022-02-21 ENCOUNTER — Other Ambulatory Visit (HOSPITAL_COMMUNITY): Payer: Self-pay

## 2022-02-21 ENCOUNTER — Telehealth: Payer: Self-pay | Admitting: Pharmacy Technician

## 2022-02-21 ENCOUNTER — Other Ambulatory Visit: Payer: Self-pay | Admitting: Internal Medicine

## 2022-02-21 DIAGNOSIS — E1165 Type 2 diabetes mellitus with hyperglycemia: Secondary | ICD-10-CM

## 2022-02-21 DIAGNOSIS — J4520 Mild intermittent asthma, uncomplicated: Secondary | ICD-10-CM

## 2022-02-21 MED ORDER — OMNIPOD 5 DEXG7G6 INTRO GEN 5 KIT: 1.0000 | PACK | 0 refills | Status: DC | PRN

## 2022-02-21 MED ORDER — DEXCOM G6 SENSOR MISC: 3 refills | Status: DC

## 2022-02-21 MED ORDER — DEXCOM G6 TRANSMITTER MISC: 3 refills | Status: DC

## 2022-02-21 NOTE — Telephone Encounter (Signed)
T, can you please let her know? C

## 2022-02-21 NOTE — Telephone Encounter (Signed)
-----   Message from Lauralyn Primes, [place] sent at 02/18/2022  4:19 PM EDT ----- Regarding: Prior Auth Can we get a PA for Omni pod 5 initiated. Expedited. Thank you.

## 2022-02-21 NOTE — Telephone Encounter (Signed)
Pt advised she received starter kit and sensors. Pt requested rx for Dexcom G6 for compatibility with pump. Rx sent.

## 2022-02-21 NOTE — Addendum Note (Signed)
Addended by: Lauralyn Primes on: 02/21/2022 04:33 PM   Modules accepted: Orders

## 2022-02-21 NOTE — Telephone Encounter (Signed)
It doesn't appear that the Omnipod requires a PA. I was able to do a test claim for the starter kit that went through for $0. The pods themselves said they were not able to be filled until after 10/17. It says they were filled on 10/6. However, it looks like they only did 5 for a 15 day supply & did not fill the starter kit.

## 2022-02-21 NOTE — Telephone Encounter (Signed)
Will work on

## 2022-02-25 ENCOUNTER — Encounter: Payer: 59 | Attending: Family Medicine | Admitting: Registered"

## 2022-02-25 NOTE — Progress Notes (Unsigned)
Start time:  ***   End time:  ***  Patient is here today *** for training on the Omnipod *** Insulin Pump.  It was confirmed that the patient was aware of the following: Blood glucose (BG) testing Treating hypoglycemia Hyperglycemia Carbohydrate counting   (Patient *** will/will not be using carbohydrate counting with use of this pump.) Sick day management  Patient was instructed on the following: Have the following supplies available to be prepared: Omnipod PDM and pods Insulin and syringe Blood glucose meter, strips, lancets, lancing device Glucose tablets/fast acting source of carbohydrate/Glucagon  Supply Reorder [place] [phone] Reorder - info/contact number When to reorder (when last box of pods is opened)  System Overview Communication process/distance Storage guidelines Diagnostic tests (CT scans/MRI/Xrays) Travel guidelines  Pod: Fill port/adhesive/needle cap/pink slide insert/Waterproof  PDM Battery/button layout/wireless updates (software updates)  PDM Settings Pump Therapy Order Form with settings below Basic settings - Personalized lock screen/time/time zone/date/date format Basal settings Max basal *** Basal rates *** Temp basal   Bolus settings Target Blood glucose  *** Insulin to carb (IC) ratio  *** Correction factor  *** for BG above 140 Minimum blood glucose for bolus calculations  *** Reverse correction Duration of insulin action  *** Extended bolus Max bolus Patient was able to accurately enter pump settings into PDM.  Pod Activation Change Pod  Room temperature insulin Fill syringe - min/max amounts DO NOT prefill Pod Site selection/rotation & prep Automated cannula insertion - check infusion site/viewing window & pink slide insert Blood glucose reminder 1.5 hours after insertion When to change POD Patient was able to place pod and view insert.  Home Screen Status Bar, Menu Icon,  Notification/Alarms Tabs Dashboard - IOB (if Calculator ON) - Instructed to leave calculator on Basal (Temp Basal if Temp Basal running) Pod Info - View Pod detail Last Bolus, Last BG Bolus button  Menu icon PDM function - Temp Basal, Pod, Enter BG, Suspend Manage Programs & Presets - Basal programs, temp basal presets, bolus presets Food Library ([employer] app, other tools for carbohydrate counting) History - Notifications & Alarms, Insulin & BG History Settings - PDM Device, Pod sites, Reminders, Blood Glucose, Basal & Temp Basal, Bolus About  Advanced Features (to be discussed at a further date based on patient needs) Extended bolus Temp basal rate Additional basal programs Presets - Temp basal/Bolus presets  Troubleshooting Hypoglycemia Sick day management resource Hyperglycemia & Ketones  Notifications & Alarms Custom reminders Pod Expiration alert [place] alert  Advisory & [place] Alarms Advisory alarms - intermittent tones - response required [place] alarm - Continuous vibration, and Tone - urgent attention required - Pod Expired, Empty Reservoir, [place], Pod Error, Auto Off, PDM Error, System Error  Ongoing Success [employer] invitation provided Omnipod app(s) Register for [employer] 24/7 Customer Care  [phone] Reviewed User Guide Showed patient Customer's Bill of Rights and Responsibilities and instructed them to review.   Handouts Provided: Insulin Pump Packet  Patient to contact me via MyChart or phone.

## 2022-03-01 ENCOUNTER — Telehealth: Payer: Self-pay | Admitting: Registered"

## 2022-03-01 NOTE — Telephone Encounter (Signed)
Contacted patient on 02/28/22 after she performed her first pod replacement.  Pt reported no issues with pod change.  Patient states she ran out of insulin before the 72 hrs. Reviewed glooko account with patient to explain how the automated mode used more insulin than the initial setting predicted. Pt changed the low pod reservoir to 30 units to allow more warning before pod runs out of insulin. Pt states she would like to learn more about carb counting and will check with insurance coverage for a diabetes nutrition visit.

## 2022-03-09 ENCOUNTER — Ambulatory Visit (HOSPITAL_COMMUNITY)
Admission: EM | Admit: 2022-03-09 | Discharge: 2022-03-09 | Disposition: A | Discharge status: Home / Self Care | Payer: 59 | Attending: Emergency Medicine | Admitting: Emergency Medicine

## 2022-03-09 ENCOUNTER — Encounter (HOSPITAL_COMMUNITY): Payer: Self-pay | Admitting: Emergency Medicine

## 2022-03-09 DIAGNOSIS — R42 Dizziness and giddiness: Secondary | ICD-10-CM

## 2022-03-09 DIAGNOSIS — H8113 Benign paroxysmal vertigo, bilateral: Secondary | ICD-10-CM | POA: Diagnosis not present

## 2022-03-09 MED ORDER — MECLIZINE HCL 12.5 MG PO TABS: 12.5000 mg | ORAL_TABLET | Freq: Three times a day (TID) | ORAL | 0 refills | Status: DC | PRN

## 2022-03-09 NOTE — ED Provider Notes (Signed)
[place]    CSN: [id number] Arrival date & time: 03/09/22  0831     History   Chief Complaint Chief Complaint  Patient presents with   Dizziness    HPI Dominique [name] is a 61 y.o. female.  Presents with dizziness that started yesterday. At first was intermittent. Reports it worsened this morning, she was having trouble standing and walking. At urgent care dizziness is lessening.  Now occurs with head motion  1 week history of intermittent bilateral ear pain, some sinus congestion  History of diabetes, reports Dexcom reads 188 this morning  History of vertigo Took her DM and HTN medicines this morning.  No new meds  Denies hematuria, hematochezia, dark stool. No vomiting or diarrhea. No recent illness. No head injury.  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  Past Medical History:  Diagnosis Date   Arthritis    left foot   Asthma    Bell palsy 2015   Diabetes mellitus without complication ([place])    type 2   Endometriosis    GERD (gastroesophageal reflux disease)    H/O hiatal hernia    Histoplasmosis 2002   history of, [place] raised   Hypertension    Kidney stone    Kidney stones     Patient Active Problem List   Diagnosis Date Noted   Seasonal allergies 10/26/2021   Asthma, chronic, unspecified asthma severity, uncomplicated 78/67/5449   Neuritis 02/14/2020   Mild intermittent asthma without complication 20/02/711   Dermatitis 11/08/2019   Type 2 diabetes mellitus with hyperglycemia, without long-term current use of insulin ([place]) 02/22/2018   BPV (benign positional vertigo) 01/04/2016   Asthma with exacerbation 09/22/2014   Acute bronchitis 04/24/2014   Hyperlipidemia 03/27/2014   Right-sided Bell's palsy 04/05/2013   ALLERGIC RHINITIS 07/08/2010   GERD 07/08/2010   WEIGHT GAIN 01/13/2010   KERATOSIS 10/09/2008   BURSITIS, RIGHT SHOULDER 10/09/2008   HOARSENESS, CHRONIC 03/24/2008   OTITIS MEDIA, SEROUS, CHRONIC,  RIGHT 03/26/2007   Essential hypertension 03/14/2007    Past Surgical History:  Procedure Laterality Date   FOOT ARTHRODESIS Left 09/05/2019   Procedure: Left Modified [place]ride, Left First and Second Tarsometatarsal Joint Arthrodesis;  Surgeon: Wylene Simmer, MD;  Location: [facility];  Service: Orthopedics;  Laterality: Left;   laparatomy  1985   RADIAL HEAD ARTHROPLASTY Left 02/23/2013   Procedure: LEFT RADIAL HEAD ARTHROPLASTY qwith ligament reconstruction;  Surgeon: Roseanne Kaufman, MD;  Location: [facility];  Service: Orthopedics;  Laterality: Left;   TONSILLECTOMY AND ADENOIDECTOMY  1967   uterine lining ablation  2002    OB History   No obstetric history on file.      Home Medications    Prior to Admission medications   Medication Sig Start Date End Date Taking? Authorizing Provider  meclizine (ANTIVERT) 12.5 MG tablet Take 1 tablet (12.5 mg total) by mouth 3 (three) times daily as needed for dizziness. 03/09/22  Yes Maciej Schweitzer, Wells Guiles, PA-C  albuterol (VENTOLIN HFA) 108 (90 Base) MCG/ACT inhaler INHALE 2 PUFFS INTO THE LUNGS EVERY 6 HOURS AS NEEDED FOR WHEEZING OR SHORTNESS OF BREATH 12/27/21   Roma Schanz R, DO  blood glucose meter kit and supplies Dispense based on patient and insurance preference. Use up to four times daily as directed. (FOR ICD-10 E10.9, E11.9). 08/06/19   Ann Held, DO  Blood Glucose Monitoring Suppl (CONTOUR NEXT ONE) KIT 1 kit by Does not apply route See admin instructions. For  checking blood sugar 2 times a day 08/15/19   Philemon Kingdom, MD  Cholecalciferol (VITAMIN D3 PO) Take 50 mcg by mouth daily.    [provider]  Continuous Blood Gluc Receiver ([place]) [place] 1 each by Does not apply route once for 1 dose. 09/06/21 10/26/21  Philemon Kingdom, MD  Continuous Blood Gluc Sensor (DEXCOM G6 SENSOR) MISC Use as instructed to check blood sugar. Change every 10 days 02/21/22   Philemon Kingdom, MD  Continuous  Blood Gluc Transmit (DEXCOM G6 TRANSMITTER) MISC Use as instructed to check blood sugar. Change every 90 days. 02/21/22   Philemon Kingdom, MD  ezetimibe (ZETIA) 10 MG tablet TAKE 1 TABLET(10 MG) BY MOUTH DAILY 11/11/21   Carollee Herter, Alferd Apa, DO  fenofibrate 160 MG tablet 1 po qd 10/26/21   Carollee Herter, Alferd Apa, DO  FLOVENT HFA 110 MCG/ACT inhaler INHALE 2 PUFFS INTO THE LUNGS TWICE DAILY 11/23/21   Carollee Herter, Alferd Apa, DO  fluticasone (FLONASE) 50 MCG/ACT nasal spray SHAKE LIQUID AND USE 2 SPRAYS IN [facility] NOSTRIL DAILY 10/26/21   Roma Schanz R, DO  glucose blood (CONTOUR NEXT TEST) test strip USE TO CHECK BLOOD SUGAR TWICE DAILY 08/23/21   Philemon Kingdom, MD  Insulin Disposable Pump (OMNIPOD 5 G6 INTRO, GEN 5,) KIT 1 each by Does not apply route as needed. 02/21/22   Philemon Kingdom, MD  Insulin Disposable Pump (OMNIPOD 5 G6 POD, GEN 5,) MISC 1 each by Does not apply route every 3 (three) days. 02/18/22   Philemon Kingdom, MD  insulin glargine (LANTUS SOLOSTAR) 100 UNIT/ML Solostar Pen Inject 48 Units into the skin at bedtime. 11/10/21   Shamleffer, Melanie Crazier, MD  Insulin Lispro-aabc (LYUMJEV KWIKPEN) 100 UNIT/ML KwikPen Inject 6-10 Units into the skin 2 (two) times daily before a meal. 10/20/21   Philemon Kingdom, MD  Insulin Lispro-aabc (LYUMJEV) 100 UNIT/ML SOLN Use up to 80 units daily and insulin pump 02/10/22   Philemon Kingdom, MD  Insulin Pen Needle (PEN NEEDLES) 33G X 4 MM MISC Use 3x a day 10/20/21   Philemon Kingdom, MD  levalbuterol (XOPENEX) 1.25 MG/3ML nebulizer solution USE ONE VIAL BY NEBULIZATION EVERY 6 HOURS AS NEEDED FOR WHEEZING OR SHORTNESS OF BREATH 10/26/21   Carollee Herter, Alferd Apa, DO  losartan (COZAAR) 100 MG tablet Take 1 tablet (100 mg total) by mouth daily. 01/24/22   Roma Schanz R, DO  magnesium 30 MG tablet Take by mouth daily.    [provider]  Microlet Lancets MISC TEST BLOOD SUGAR TWICE DAILY 12/07/20   Philemon Kingdom, MD  montelukast  (SINGULAIR) 10 MG tablet Take 1 tablet (10 mg total) by mouth at bedtime. 01/24/22   Roma Schanz R, DO  NONFORMULARY OR COMPOUNDED ITEM Cmp, lipid   Dx hyperlipidemia 02/14/20   Carollee Herter, Alferd Apa, DO  NONFORMULARY OR COMPOUNDED ITEM Cmp, lipid--- dx htn and hyperlipidemia Microalbumin---  dx DM II 11/13/20   Ann Held, DO  NONFORMULARY OR COMPOUNDED ITEM Hep C antibody--- need for hep c screen 11/13/20   Ann Held, DO  NONFORMULARY OR COMPOUNDED ITEM Cbcd , cmp, lipid----- dx hyperlipidemia, htn, dm II 10/26/21   Carollee Herter, Alferd Apa, DO  Omega-3 Fatty Acids (FISH OIL) 1000 MG CAPS Take by mouth.    [provider]  omeprazole (PRILOSEC) 40 MG capsule TAKE 1 CAPSULE(40 MG) BY MOUTH DAILY 08/09/21   Carollee Herter, Yvonne R, DO  OZEMPIC, 1 MG/DOSE,  4 MG/3ML SOPN INJECT $RemoveBef'1MG'nzZQErDEtr$  INTO THE SKIN ONCE A WEEK 01/18/22   Philemon Kingdom, MD  vitamin C (ASCORBIC ACID) 500 MG tablet Take 500 mg by mouth daily.    [provider]    Family History Family History  Problem Relation Age of Onset   Diabetes Mother    Hyperlipidemia Mother    Hypertension Mother    Diabetes Father    Hyperlipidemia Father    Hypertension Father     Social History Social History   Tobacco Use   Smoking status: Never   Smokeless tobacco: Never  Substance Use Topics   Alcohol use: No   Drug use: No     Allergies   Patient has no known allergies.   Review of Systems Review of Systems  Neurological:  Positive for dizziness.   Per HPI  Physical Exam Triage Vital Signs ED Triage Vitals  Enc Vitals Group     BP 03/09/22 0840 137/66     Pulse Rate 03/09/22 0840 88     Resp 03/09/22 0840 17     Temp 03/09/22 0840 (!) 97.5 F (36.4 C)     Temp Source 03/09/22 0840 Oral     SpO2 03/09/22 0840 100 %     Weight --      Height --      Head Circumference --      Peak Flow --      Pain Score 03/09/22 0839 3     Pain Loc --      Pain Edu? --      Excl. in [place]? --     No data found.  Updated Vital Signs BP 137/66 (BP Location: Right Arm)   Pulse 88   Temp (!) 97.5 F (36.4 C) (Oral)   Resp 17   SpO2 100%    Physical Exam Vitals and nursing note reviewed.  Constitutional:      General: She is not in acute distress. HENT:     Head: Normocephalic and atraumatic.     Right Ear: Tympanic membrane and ear canal normal.     Left Ear: Tympanic membrane and ear canal normal.     Nose: Nose normal.     Mouth/Throat:     Mouth: Mucous membranes are moist.     Pharynx: Oropharynx is clear.  Eyes:     Extraocular Movements: Extraocular movements intact.     Conjunctiva/sclera: Conjunctivae normal.     Pupils: Pupils are equal, round, and reactive to light.  Cardiovascular:     Rate and Rhythm: Normal rate and regular rhythm.     Heart sounds: Normal heart sounds.  Pulmonary:     Effort: Pulmonary effort is normal. No respiratory distress.     Breath sounds: Normal breath sounds.  Musculoskeletal:        General: Normal range of motion.     Cervical back: Normal range of motion.  Neurological:     Mental Status: She is alert and oriented to person, place, and time.     Cranial Nerves: Facial asymmetry present.     Sensory: Sensation is intact. No sensory deficit.     Motor: Motor function is intact. No weakness or pronator drift.     Coordination: Coordination is intact. Coordination normal. Finger-Nose-Finger Test normal.     Gait: Gait normal.     Comments: Minimal right facial droop baseline. 5/5 strength all extremities. Sensation intact.     UC Treatments / Results  Labs (all  labs ordered are listed, but only abnormal results are displayed) Labs Reviewed - No data to display  EKG  Radiology No results found.  Procedures Procedures  Medications Ordered in UC Medications - No data to display  Initial Impression / Assessment and Plan / UC Course  I have reviewed the triage vital signs and the nursing notes.  Pertinent labs  & imaging results that were available during my care of the patient were reviewed by me and considered in my medical decision making (see chart for details).  Patient and husband decline blood work today - have appointment with PCP tomorrow morning. Could be positional vertigo given the change with head motion. Neuro exam intact today excluding her baseline facial asymmetry. Will try 12.5 mg prn Antivert Has close follow up with PCP but discussed strict ED precautions. She does have neurologist she can see if needed. Return precautions discussed. Patient agrees to plan  Final Clinical Impressions(s) / UC Diagnoses   Final diagnoses:  Dizziness  Benign paroxysmal positional vertigo due to bilateral vestibular disorder     Discharge Instructions      You can try the vertigo medicine 3 times daily as needed.  Please follow up with your primary care provider within the next day or two.  I recommend to follow up with your neurologist if symptoms persist. Please go to the emergency department if symptoms worsen.     ED Prescriptions     Medication Sig Dispense Auth. Provider   meclizine (ANTIVERT) 12.5 MG tablet Take 1 tablet (12.5 mg total) by mouth 3 (three) times daily as needed for dizziness. 30 tablet Hakeem Frazzini, Wells Guiles, PA-C      PDMP not reviewed this encounter.   Sheenah Dimitroff, Wells Guiles, PA-C 03/09/22 1011

## 2022-03-09 NOTE — Discharge Instructions (Signed)
You can try the vertigo medicine 3 times daily as needed.  Please follow up with your primary care provider within the next day or two.  I recommend to follow up with your neurologist if symptoms persist. Please go to the emergency department if symptoms worsen.

## 2022-03-09 NOTE — ED Triage Notes (Signed)
Pt reports dizziness that started yesterday that was intermittent. Pt reports dizziness today. Had intermittent ear pain for a couple weeks that is bilateral.  Hx vertigo.  pt adds that she has a headache. Per pt's dexcom CBG 188-ate about hour ago.

## 2022-03-10 ENCOUNTER — Encounter: Payer: Self-pay | Admitting: Family Medicine

## 2022-03-10 ENCOUNTER — Ambulatory Visit: Payer: 59 | Admitting: Family Medicine

## 2022-03-10 VITALS — BP 124/80 | HR 88 | Temp 98.3°F | Resp 18 | Ht 63.0 in | Wt 156.0 lb

## 2022-03-10 DIAGNOSIS — I10 Essential (primary) hypertension: Secondary | ICD-10-CM

## 2022-03-10 DIAGNOSIS — J302 Other seasonal allergic rhinitis: Secondary | ICD-10-CM | POA: Diagnosis not present

## 2022-03-10 DIAGNOSIS — E785 Hyperlipidemia, unspecified: Secondary | ICD-10-CM

## 2022-03-10 DIAGNOSIS — J45909 Unspecified asthma, uncomplicated: Secondary | ICD-10-CM

## 2022-03-10 DIAGNOSIS — J0140 Acute pansinusitis, unspecified: Secondary | ICD-10-CM

## 2022-03-10 DIAGNOSIS — R42 Dizziness and giddiness: Secondary | ICD-10-CM

## 2022-03-10 LAB — POC COVID19 BINAXNOW: SARS Coronavirus 2 Ag: NEGATIVE

## 2022-03-10 MED ORDER — FENOFIBRATE 160 MG PO TABS: ORAL_TABLET | ORAL | 1 refills | Status: DC

## 2022-03-10 MED ORDER — LEVALBUTEROL HCL 1.25 MG/3ML IN NEBU: INHALATION_SOLUTION | RESPIRATORY_TRACT | 1 refills | Status: DC

## 2022-03-10 MED ORDER — AMOXICILLIN-POT CLAVULANATE 875-125 MG PO TABS: 1.0000 | ORAL_TABLET | Freq: Two times a day (BID) | ORAL | 0 refills | Status: DC

## 2022-03-10 MED ORDER — CETIRIZINE HCL 10 MG PO TABS: 10.0000 mg | ORAL_TABLET | Freq: Every day | ORAL | 11 refills | Status: DC

## 2022-03-10 MED ORDER — FLUTICASONE PROPIONATE 50 MCG/ACT NA SUSP: NASAL | 3 refills | Status: DC

## 2022-03-10 MED ORDER — EZETIMIBE 10 MG PO TABS: ORAL_TABLET | ORAL | 1 refills | Status: DC

## 2022-03-10 MED ORDER — LOSARTAN POTASSIUM 100 MG PO TABS: 100.0000 mg | ORAL_TABLET | Freq: Every day | ORAL | 1 refills | Status: DC

## 2022-03-10 MED ORDER — FLUCONAZOLE 150 MG PO TABS: ORAL_TABLET | ORAL | 0 refills | Status: DC

## 2022-03-10 MED ORDER — MONTELUKAST SODIUM 10 MG PO TABS: 10.0000 mg | ORAL_TABLET | Freq: Every day | ORAL | 1 refills | Status: DC

## 2022-03-10 NOTE — Assessment & Plan Note (Signed)
Due to sinusitis

## 2022-03-10 NOTE — Progress Notes (Signed)
Subjective:   By signing my name below, I, Shehryar Baig, attest that this documentation has been prepared under the direction and in the presence of Donato Schultz, DO. 03/10/2022    Patient ID: Dominique [name], female    DOB: 1961/05/05, 61 y.o.   MRN: [id number]  Chief Complaint  Patient presents with   UC follow up    Dizziness and nausea, Pt states sxs have improved but still feeling dizziness.    HPI Patient is in today for a follow up visit. Her husband is present with her during this visit.   She was admitted to the ED on 03/10/2022 for dizziness.  She was dizzy yesterday morning and went to the ED. She was complaining of ear pain for the past couple of weeks. Her ear pain would occasionally switch ears and constant scratchy throat due to drainage. While in the ED they found she had some cloudiness in her TM but otherwise her ears were clear. She also developed on and off sinus pain and headaches for the past couple of days and has taken OTC tylenol yesterday to manage her symptoms. Her neurological exam was intact excluding her baseline facial asymmetry. She denies any fevers. She has started a omni-pod insulin pump last month and reports her blood sugar has no recent abnormal readings.  She is requesting a refill for 10 mg Zetia, 160 mg fenofibrate, 50 mcg/ACT fluticasone, 1.25/16ml levalbuterol, 100 mg losartan, 10 mg Singulair.    Past Medical History:  Diagnosis Date   Arthritis    left foot   Asthma    Bell palsy 2015   Diabetes mellitus without complication (HCC)    type 2   Endometriosis    GERD (gastroesophageal reflux disease)    H/O hiatal hernia    Histoplasmosis 2002   history of, [place] raised   Hypertension    Kidney stone    Kidney stones     Past Surgical History:  Procedure Laterality Date   FOOT ARTHRODESIS Left 09/05/2019   Procedure: Left Modified [place], Left First and Second Tarsometatarsal Joint Arthrodesis;  Surgeon: Toni Arthurs,  MD;  Location: [facility];  Service: Orthopedics;  Laterality: Left;   laparatomy  1985   RADIAL HEAD ARTHROPLASTY Left 02/23/2013   Procedure: LEFT RADIAL HEAD ARTHROPLASTY qwith ligament reconstruction;  Surgeon: Dominica Severin, MD;  Location: [facility] OR;  Service: Orthopedics;  Laterality: Left;   TONSILLECTOMY AND ADENOIDECTOMY  1967   uterine lining ablation  2002    Family History  Problem Relation Age of Onset   Diabetes Mother    Hyperlipidemia Mother    Hypertension Mother    Diabetes Father    Hyperlipidemia Father    Hypertension Father     Social History   Socioeconomic History   Marital status: Married    Spouse name: Not on file   Number of children: Not on file   Years of education: Not on file   Highest education level: Not on file  Occupational History    Comment: unemployed  Tobacco Use   Smoking status: Never   Smokeless tobacco: Never  Substance and Sexual Activity   Alcohol use: No   Drug use: No   Sexual activity: Yes    Partners: Male  Other Topics Concern   Not on file  Social History Narrative   Exercise-- no   Social Determinants of Health   Financial Resource Strain: Not on file  Food Insecurity: Not on file  Transportation Needs: Not on file  Physical Activity: Not on file  Stress: Not on file  Social Connections: Not on file  Intimate Partner Violence: Not on file    Outpatient Medications Prior to Visit  Medication Sig Dispense Refill   albuterol (VENTOLIN HFA) 108 (90 Base) MCG/ACT inhaler INHALE 2 PUFFS INTO THE LUNGS EVERY 6 HOURS AS NEEDED FOR WHEEZING OR SHORTNESS OF BREATH 20.1 g 1   blood glucose meter kit and supplies Dispense based on patient and insurance preference. Use up to four times daily as directed. (FOR ICD-10 E10.9, E11.9). 1 each 0   Blood Glucose Monitoring Suppl (CONTOUR NEXT ONE) KIT 1 kit by Does not apply route See admin instructions. For checking blood sugar 2 times a day 1 kit 0    Cholecalciferol (VITAMIN D3 PO) Take 50 mcg by mouth daily.     Continuous Blood Gluc Sensor (DEXCOM G6 SENSOR) MISC Use as instructed to check blood sugar. Change every 10 days 9 each 3   Continuous Blood Gluc Transmit (DEXCOM G6 TRANSMITTER) MISC Use as instructed to check blood sugar. Change every 90 days. 1 each 3   FLOVENT HFA 110 MCG/ACT inhaler INHALE 2 PUFFS INTO THE LUNGS TWICE DAILY 12 g 1   glucose blood (CONTOUR NEXT TEST) test strip USE TO CHECK BLOOD SUGAR TWICE DAILY 200 strip 2   Insulin Disposable Pump (OMNIPOD 5 G6 INTRO, GEN 5,) KIT 1 each by Does not apply route as needed. 1 kit 0   Insulin Disposable Pump (OMNIPOD 5 G6 POD, GEN 5,) MISC 1 each by Does not apply route every 3 (three) days. 30 each 3   Insulin Lispro-aabc (LYUMJEV KWIKPEN) 100 UNIT/ML KwikPen Inject 6-10 Units into the skin 2 (two) times daily before a meal. 15 mL 11   Insulin Lispro-aabc (LYUMJEV) 100 UNIT/ML SOLN Use up to 80 units daily and insulin pump 30 mL 5   Insulin Pen Needle (PEN NEEDLES) 33G X 4 MM MISC Use 3x a day 300 each 3   magnesium 30 MG tablet Take by mouth daily.     meclizine (ANTIVERT) 12.5 MG tablet Take 1 tablet (12.5 mg total) by mouth 3 (three) times daily as needed for dizziness. 30 tablet 0   Microlet Lancets MISC TEST BLOOD SUGAR TWICE DAILY 200 each 3   NONFORMULARY OR COMPOUNDED ITEM Cmp, lipid   Dx hyperlipidemia 1 each 0   NONFORMULARY OR COMPOUNDED ITEM Cmp, lipid--- dx htn and hyperlipidemia Microalbumin---  dx DM II 1 each 0   NONFORMULARY OR COMPOUNDED ITEM Hep C antibody--- need for hep c screen 1 each 0   NONFORMULARY OR COMPOUNDED ITEM Cbcd , cmp, lipid----- dx hyperlipidemia, htn, dm II 1 each 0   Omega-3 Fatty Acids (FISH OIL) 1000 MG CAPS Take by mouth.     omeprazole (PRILOSEC) 40 MG capsule TAKE 1 CAPSULE(40 MG) BY MOUTH DAILY 90 capsule 3   OZEMPIC, 1 MG/DOSE, 4 MG/3ML SOPN INJECT $RemoveBef'1MG'PTIEZHJQrA$  INTO THE SKIN ONCE A WEEK 9 mL 3   vitamin C (ASCORBIC ACID) 500 MG tablet Take  500 mg by mouth daily.     ezetimibe (ZETIA) 10 MG tablet TAKE 1 TABLET(10 MG) BY MOUTH DAILY 90 tablet 1   fenofibrate 160 MG tablet 1 po qd 90 tablet 1   fluticasone (FLONASE) 50 MCG/ACT nasal spray SHAKE LIQUID AND USE 2 SPRAYS IN EACH NOSTRIL DAILY 48 g 3   levalbuterol (XOPENEX) 1.25 MG/3ML nebulizer solution USE ONE VIAL BY NEBULIZATION  EVERY 6 HOURS AS NEEDED FOR WHEEZING OR SHORTNESS OF BREATH 675 mL 1   losartan (COZAAR) 100 MG tablet Take 1 tablet (100 mg total) by mouth daily. 90 tablet 1   montelukast (SINGULAIR) 10 MG tablet Take 1 tablet (10 mg total) by mouth at bedtime. 90 tablet 1   Continuous Blood Gluc Receiver ([place]) DEVI 1 each by Does not apply route once for 1 dose. 1 each 0   insulin glargine (LANTUS SOLOSTAR) 100 UNIT/ML Solostar Pen Inject 48 Units into the skin at bedtime. 45 mL 3   No facility-administered medications prior to visit.    No Known Allergies  Review of Systems  Constitutional:  Negative for fever and malaise/fatigue.  HENT:  Positive for ear pain, sinus pain and sore throat (scratchy throat). Negative for congestion.   Eyes:  Negative for blurred vision.  Respiratory:  Negative for cough and shortness of breath.   Cardiovascular:  Negative for chest pain, palpitations and leg swelling.  Gastrointestinal:  Negative for vomiting.  Musculoskeletal:  Negative for back pain.  Skin:  Negative for rash.  Neurological:  Positive for dizziness and headaches. Negative for loss of consciousness.       Objective:    Physical Exam Vitals and nursing note reviewed.  Constitutional:      General: She is not in acute distress.    Appearance: Normal appearance. She is not ill-appearing.  HENT:     Head: Normocephalic and atraumatic.     Right Ear: Tympanic membrane, ear canal and external ear normal.     Left Ear: Tympanic membrane, ear canal and external ear normal.  Eyes:     Extraocular Movements: Extraocular movements intact.      Pupils: Pupils are equal, round, and reactive to light.  Cardiovascular:     Rate and Rhythm: Normal rate and regular rhythm.     Heart sounds: Normal heart sounds. No murmur heard.    No gallop.  Pulmonary:     Effort: Pulmonary effort is normal. No respiratory distress.     Breath sounds: Normal breath sounds. No wheezing or rales.     Comments: Tested negative for Covid-19  Lymphadenopathy:     Cervical: Cervical adenopathy present.  Skin:    General: Skin is warm and dry.  Neurological:     Mental Status: She is alert and oriented to person, place, and time.  Psychiatric:        Judgment: Judgment normal.     BP 124/80 (BP Location: Left Arm, Patient Position: Sitting, Cuff Size: Normal)   Pulse 88   Temp 98.3 F (36.8 C) (Oral)   Resp 18   Ht $R'5\' 3"'VE$  (1.6 m)   Wt 156 lb (70.8 kg)   SpO2 98%   BMI 27.63 kg/m  Wt Readings from Last 3 Encounters:  03/10/22 156 lb (70.8 kg)  02/07/22 155 lb 12.8 oz (70.7 kg)  10/26/21 154 lb 3.2 oz (69.9 kg)    Diabetic Foot Exam - Simple   No data filed    Lab Results  Component Value Date   WBC 5.3 10/27/2021   HGB 15.9 10/27/2021   HCT 47.0 (H) 10/27/2021   PLT 151 10/27/2021   GLUCOSE 152 (H) 10/27/2021   CHOL 136 10/27/2021   TRIG 102 10/27/2021   HDL 48 10/27/2021   LDLCALC 69 10/27/2021   ALT 46 (H) 10/27/2021   AST 35 10/27/2021   NA 142 10/27/2021   K 4.6 10/27/2021  CL 101 10/27/2021   CREATININE 0.80 10/27/2021   BUN 13 10/27/2021   CO2 22 10/27/2021   TSH 1.56 03/24/2014   HGBA1C 7.1 (A) 02/07/2022    Lab Results  Component Value Date   TSH 1.56 03/24/2014   Lab Results  Component Value Date   WBC 5.3 10/27/2021   HGB 15.9 10/27/2021   HCT 47.0 (H) 10/27/2021   MCV 90 10/27/2021   PLT 151 10/27/2021   Lab Results  Component Value Date   NA 142 10/27/2021   K 4.6 10/27/2021   CO2 22 10/27/2021   GLUCOSE 152 (H) 10/27/2021   BUN 13 10/27/2021   CREATININE 0.80 10/27/2021   BILITOT 0.9  10/27/2021   ALKPHOS 101 10/27/2021   AST 35 10/27/2021   ALT 46 (H) 10/27/2021   PROT 7.2 10/27/2021   ALBUMIN 4.9 (H) 10/27/2021   CALCIUM 10.4 (H) 10/27/2021   EGFR 83 10/27/2021   Lab Results  Component Value Date   CHOL 136 10/27/2021   Lab Results  Component Value Date   HDL 48 10/27/2021   Lab Results  Component Value Date   LDLCALC 69 10/27/2021   Lab Results  Component Value Date   TRIG 102 10/27/2021   Lab Results  Component Value Date   CHOLHDL 3.1 11/19/2020   Lab Results  Component Value Date   HGBA1C 7.1 (A) 02/07/2022       Assessment & Plan:   Problem List Items Addressed This Visit       Unprioritized   Seasonal allergies   Relevant Medications   fluticasone (FLONASE) 50 MCG/ACT nasal spray   montelukast (SINGULAIR) 10 MG tablet   Other Relevant Orders   POC COVID-19 (Completed)   Hyperlipidemia   Relevant Medications   ezetimibe (ZETIA) 10 MG tablet   fenofibrate 160 MG tablet   losartan (COZAAR) 100 MG tablet   Essential hypertension   Relevant Medications   ezetimibe (ZETIA) 10 MG tablet   fenofibrate 160 MG tablet   losartan (COZAAR) 100 MG tablet   Asthma, chronic, unspecified asthma severity, uncomplicated   Relevant Medications   levalbuterol (XOPENEX) 1.25 MG/3ML nebulizer solution   montelukast (SINGULAIR) 10 MG tablet   Dizzy - Primary    Due to sinusitis      Relevant Orders   POC COVID-19 (Completed)   CBC with Differential/Platelet   Comprehensive metabolic panel   TSH   Vitamin B12   Acute non-recurrent pansinusitis    abx-- augmentin con't flonase and add zyrtec Diflucan written for possible yeast infection from abx Call back if no better and we will consider pred taper       Relevant Medications   fluticasone (FLONASE) 50 MCG/ACT nasal spray   cetirizine (ZYRTEC) 10 MG tablet   amoxicillin-clavulanate (AUGMENTIN) 875-125 MG tablet   fluconazole (DIFLUCAN) 150 MG tablet     Meds ordered this  encounter  Medications   ezetimibe (ZETIA) 10 MG tablet    Sig: TAKE 1 TABLET(10 MG) BY MOUTH DAILY    Dispense:  90 tablet    Refill:  1   fenofibrate 160 MG tablet    Sig: 1 po qd    Dispense:  90 tablet    Refill:  1   levalbuterol (XOPENEX) 1.25 MG/3ML nebulizer solution    Sig: USE ONE VIAL BY NEBULIZATION EVERY 6 HOURS AS NEEDED FOR WHEEZING OR SHORTNESS OF BREATH    Dispense:  675 mL    Refill:  1    **  Patient requests 90 days supply**   fluticasone (FLONASE) 50 MCG/ACT nasal spray    Sig: SHAKE LIQUID AND USE 2 SPRAYS IN EACH NOSTRIL DAILY    Dispense:  48 g    Refill:  3    ZERO refills remain on this prescription. Your patient is requesting advance approval of refills for this medication to PREVENT ANY MISSED DOSES   losartan (COZAAR) 100 MG tablet    Sig: Take 1 tablet (100 mg total) by mouth daily.    Dispense:  90 tablet    Refill:  1   montelukast (SINGULAIR) 10 MG tablet    Sig: Take 1 tablet (10 mg total) by mouth at bedtime.    Dispense:  90 tablet    Refill:  1   cetirizine (ZYRTEC) 10 MG tablet    Sig: Take 1 tablet (10 mg total) by mouth daily.    Dispense:  30 tablet    Refill:  11   amoxicillin-clavulanate (AUGMENTIN) 875-125 MG tablet    Sig: Take 1 tablet by mouth 2 (two) times daily.    Dispense:  20 tablet    Refill:  0   fluconazole (DIFLUCAN) 150 MG tablet    Sig: 1 po x1, may repeat in 3 days prn    Dispense:  2 tablet    Refill:  0    I, Ann Held, DO, personally preformed the services described in this documentation.  All medical record entries made by the scribe were at my direction and in my presence.  I have reviewed the chart and discharge instructions (if applicable) and agree that the record reflects my personal performance and is accurate and complete. 03/10/2022   I,Shehryar Baig,acting as a scribe for Ann Held, DO.,have documented all relevant documentation on the behalf of Ann Held, DO,as directed  by  Ann Held, DO while in the presence of Ann Held, DO.   Ann Held, DO

## 2022-03-10 NOTE — Assessment & Plan Note (Signed)
abx-- augmentin con't flonase and add zyrtec Diflucan written for possible yeast infection from abx Call back if no better and we will consider pred taper

## 2022-03-12 LAB — CBC WITH DIFFERENTIAL/PLATELET
Basophils Absolute: 0 10*3/uL (ref 0.0–0.2)
Basos: 0 %
EOS (ABSOLUTE): 0.1 10*3/uL (ref 0.0–0.4)
Eos: 1 %
Hematocrit: 45.2 % (ref 34.0–46.6)
Hemoglobin: 15 g/dL (ref 11.1–15.9)
Immature Grans (Abs): 0 10*3/uL (ref 0.0–0.1)
Immature Granulocytes: 0 %
Lymphocytes Absolute: 1.9 10*3/uL (ref 0.7–3.1)
Lymphs: 36 %
MCH: 29.6 pg (ref 26.6–33.0)
MCHC: 33.2 g/dL (ref 31.5–35.7)
MCV: 89 fL (ref 79–97)
Monocytes Absolute: 0.3 10*3/uL (ref 0.1–0.9)
Monocytes: 6 %
Neutrophils Absolute: 3 10*3/uL (ref 1.4–7.0)
Neutrophils: 57 %
Platelets: 157 10*3/uL (ref 150–450)
RBC: 5.06 x10E6/uL (ref 3.77–5.28)
RDW: 13 % (ref 11.7–15.4)
WBC: 5.4 10*3/uL (ref 3.4–10.8)

## 2022-03-12 LAB — COMPREHENSIVE METABOLIC PANEL
ALT: 41 IU/L — ABNORMAL HIGH (ref 0–32)
AST: 31 IU/L (ref 0–40)
Albumin/Globulin Ratio: 2.3 — ABNORMAL HIGH (ref 1.2–2.2)
Albumin: 4.5 g/dL (ref 3.9–4.9)
Alkaline Phosphatase: 89 IU/L (ref 44–121)
BUN/Creatinine Ratio: 14 (ref 12–28)
BUN: 12 mg/dL (ref 8–27)
Bilirubin Total: 0.6 mg/dL (ref 0.0–1.2)
CO2: 20 mmol/L (ref 20–29)
Calcium: 9.7 mg/dL (ref 8.7–10.3)
Chloride: 104 mmol/L (ref 96–106)
Creatinine, Ser: 0.84 mg/dL (ref 0.57–1.00)
Globulin, Total: 2 g/dL (ref 1.5–4.5)
Glucose: 147 mg/dL — ABNORMAL HIGH (ref 70–99)
Potassium: 4.3 mmol/L (ref 3.5–5.2)
Sodium: 144 mmol/L (ref 134–144)
Total Protein: 6.5 g/dL (ref 6.0–8.5)
eGFR: 78 mL/min/{1.73_m2} (ref >59)

## 2022-03-12 LAB — TSH: TSH: 1.65 u[IU]/mL (ref 0.450–4.500)

## 2022-03-12 LAB — VITAMIN B12: Vitamin B-12: 256 pg/mL (ref 232–1245)

## 2022-03-14 ENCOUNTER — Encounter: Payer: Self-pay | Admitting: Family Medicine

## 2022-03-21 ENCOUNTER — Other Ambulatory Visit: Payer: Self-pay | Admitting: Family Medicine

## 2022-03-21 DIAGNOSIS — J45909 Unspecified asthma, uncomplicated: Secondary | ICD-10-CM

## 2022-03-21 MED ORDER — QVAR REDIHALER 40 MCG/ACT IN AERB: 2.0000 | INHALATION_SPRAY | Freq: Two times a day (BID) | RESPIRATORY_TRACT | 3 refills | Status: DC

## 2022-04-26 ENCOUNTER — Ambulatory Visit: Payer: 59 | Admitting: Family Medicine

## 2022-04-26 ENCOUNTER — Encounter: Payer: Self-pay | Admitting: Family Medicine

## 2022-04-26 VITALS — BP 126/70 | HR 99 | Temp 98.6°F | Resp 18 | Ht 63.0 in | Wt 157.2 lb

## 2022-04-26 DIAGNOSIS — J209 Acute bronchitis, unspecified: Secondary | ICD-10-CM

## 2022-04-26 DIAGNOSIS — E1165 Type 2 diabetes mellitus with hyperglycemia: Secondary | ICD-10-CM

## 2022-04-26 DIAGNOSIS — J40 Bronchitis, not specified as acute or chronic: Secondary | ICD-10-CM

## 2022-04-26 DIAGNOSIS — J45909 Unspecified asthma, uncomplicated: Secondary | ICD-10-CM | POA: Diagnosis not present

## 2022-04-26 DIAGNOSIS — J0140 Acute pansinusitis, unspecified: Secondary | ICD-10-CM | POA: Diagnosis not present

## 2022-04-26 DIAGNOSIS — J302 Other seasonal allergic rhinitis: Secondary | ICD-10-CM | POA: Diagnosis not present

## 2022-04-26 MED ORDER — FLUCONAZOLE 150 MG PO TABS: 150.0000 mg | ORAL_TABLET | Freq: Every day | ORAL | 2 refills | Status: DC

## 2022-04-26 MED ORDER — AMOXICILLIN-POT CLAVULANATE 875-125 MG PO TABS: 1.0000 | ORAL_TABLET | Freq: Two times a day (BID) | ORAL | 0 refills | Status: DC

## 2022-04-26 MED ORDER — PROMETHAZINE-DM 6.25-15 MG/5ML PO SYRP: 5.0000 mL | ORAL_SOLUTION | Freq: Four times a day (QID) | ORAL | 0 refills | Status: DC | PRN

## 2022-04-26 MED ORDER — FLUTICASONE PROPIONATE HFA 110 MCG/ACT IN AERO: 2.0000 | INHALATION_SPRAY | Freq: Two times a day (BID) | RESPIRATORY_TRACT | 1 refills | Status: DC

## 2022-04-26 NOTE — Assessment & Plan Note (Signed)
Abx per orders Con't flonase and antihistamine

## 2022-04-26 NOTE — Assessment & Plan Note (Signed)
Abx per orders Con't inhalers--- will refill flovent Hold off on steroids right now due to dm

## 2022-04-26 NOTE — Progress Notes (Addendum)
Subjective:   By signing my name below, I, Carylon Perches, attest that this documentation has been prepared under the direction and in the presence of Ann Held DO 04/26/2022    Patient ID: Dominique [name], female    DOB: [birth year], 61 y.o.   MRN: [id number]  Chief Complaint  Patient presents with   Cough    sxs started last week. Pt states having cough, ear, headache, fatigue. Pt states taking OTC Sudafed and Zyrtec.     HPI Patient is in today for an office visit  Patient presents with cough, sore throat, and a swollen right eye. Her sore throat appears when she has a coughing fit. She states that symptoms first appeared last week as asthma. She is currently taking Sudafed, Zyrtec and Flonase which she believes are alleviating her symptoms. She denies of any fevers at home. She reports that she used the [employer] earlier today.   Her blood glucose levels are elevating. She reports a glucose level of 192 mg/dL. She states that she has been consuming cough drops.  Lab Results  Component Value Date   HGBA1C 7.1 (A) 02/07/2022   She is inquiring if she could receive an influenza vaccine during today's visit.  She states that she is having trouble counting the number of calories for her insulin machine.   Past Medical History:  Diagnosis Date   Arthritis    left foot   Asthma    Bell palsy 2015   Diabetes mellitus without complication ([place])    type 2   Endometriosis    GERD (gastroesophageal reflux disease)    H/O hiatal hernia    Histoplasmosis 2002   history of, [place] raised   Hypertension    Kidney stone    Kidney stones     Past Surgical History:  Procedure Laterality Date   FOOT ARTHRODESIS Left 09/05/2019   Procedure: Left Modified [place], Left First and Second Tarsometatarsal Joint Arthrodesis;  Surgeon: Wylene Simmer, MD;  Location: [place];  Service: Orthopedics;  Laterality: Left;   laparatomy  1985   RADIAL HEAD  ARTHROPLASTY Left 02/23/2013   Procedure: LEFT RADIAL HEAD ARTHROPLASTY qwith ligament reconstruction;  Surgeon: Roseanne Kaufman, MD;  Location: [place];  Service: Orthopedics;  Laterality: Left;   TONSILLECTOMY AND ADENOIDECTOMY  1967   uterine lining ablation  2002    Family History  Problem Relation Age of Onset   Diabetes Mother    Hyperlipidemia Mother    Hypertension Mother    Diabetes Father    Hyperlipidemia Father    Hypertension Father     Social History   Socioeconomic History   Marital status: Married    Spouse name: Not on file   Number of children: Not on file   Years of education: Not on file   Highest education level: Not on file  Occupational History    Comment: unemployed  Tobacco Use   Smoking status: Never   Smokeless tobacco: Never  Substance and Sexual Activity   Alcohol use: No   Drug use: No   Sexual activity: Yes    Partners: Male  Other Topics Concern   Not on file  Social History Narrative   Exercise-- no   Social Determinants of Health   Financial Resource Strain: Not on file  Food Insecurity: Not on file  Transportation Needs: Not on file  Physical Activity: Not on file  Stress: Not on file  Social Connections: Not on file  Intimate Partner Violence: Not on file    Outpatient Medications Prior to Visit  Medication Sig Dispense Refill   albuterol (VENTOLIN HFA) 108 (90 Base) MCG/ACT inhaler INHALE 2 PUFFS INTO THE LUNGS EVERY 6 HOURS AS NEEDED FOR WHEEZING OR SHORTNESS OF BREATH 20.1 g 1   beclomethasone (QVAR REDIHALER) 40 MCG/ACT inhaler Inhale 2 puffs into the lungs 2 (two) times daily. 1 each 3   blood glucose meter kit and supplies Dispense based on patient and insurance preference. Use up to four times daily as directed. (FOR ICD-10 E10.9, E11.9). 1 each 0   Blood Glucose Monitoring Suppl (CONTOUR NEXT ONE) KIT 1 kit by Does not apply route See admin instructions. For checking blood sugar 2 times a day 1 kit 0   cetirizine  (ZYRTEC) 10 MG tablet Take 1 tablet (10 mg total) by mouth daily. 30 tablet 11   Cholecalciferol (VITAMIN D3 PO) Take 50 mcg by mouth daily.     Continuous Blood Gluc Sensor (DEXCOM G6 SENSOR) MISC Use as instructed to check blood sugar. Change every 10 days 9 each 3   Continuous Blood Gluc Transmit (DEXCOM G6 TRANSMITTER) MISC Use as instructed to check blood sugar. Change every 90 days. 1 each 3   ezetimibe (ZETIA) 10 MG tablet TAKE 1 TABLET(10 MG) BY MOUTH DAILY 90 tablet 1   fenofibrate 160 MG tablet 1 po qd 90 tablet 1   fluconazole (DIFLUCAN) 150 MG tablet 1 po x1, may repeat in 3 days prn 2 tablet 0   fluticasone (FLONASE) 50 MCG/ACT nasal spray SHAKE LIQUID AND USE 2 SPRAYS IN EACH NOSTRIL DAILY 48 g 3   glucose blood (CONTOUR NEXT TEST) test strip USE TO CHECK BLOOD SUGAR TWICE DAILY 200 strip 2   Insulin Disposable Pump (OMNIPOD 5 G6 INTRO, GEN 5,) KIT 1 each by Does not apply route as needed. 1 kit 0   Insulin Disposable Pump (OMNIPOD 5 G6 POD, GEN 5,) MISC 1 each by Does not apply route every 3 (three) days. 30 each 3   Insulin Lispro-aabc (LYUMJEV) 100 UNIT/ML SOLN Use up to 80 units daily and insulin pump 30 mL 5   Insulin Pen Needle (PEN NEEDLES) 33G X 4 MM MISC Use 3x a day 300 each 3   levalbuterol (XOPENEX) 1.25 MG/3ML nebulizer solution USE ONE VIAL BY NEBULIZATION EVERY 6 HOURS AS NEEDED FOR WHEEZING OR SHORTNESS OF BREATH 675 mL 1   losartan (COZAAR) 100 MG tablet Take 1 tablet (100 mg total) by mouth daily. 90 tablet 1   magnesium 30 MG tablet Take by mouth daily.     Microlet [place] MISC TEST BLOOD SUGAR TWICE DAILY 200 each 3   montelukast (SINGULAIR) 10 MG tablet Take 1 tablet (10 mg total) by mouth at bedtime. 90 tablet 1   NONFORMULARY OR COMPOUNDED ITEM Cmp, lipid   Dx hyperlipidemia 1 each 0   NONFORMULARY OR COMPOUNDED ITEM Cmp, lipid--- dx htn and hyperlipidemia Microalbumin---  dx DM II 1 each 0   NONFORMULARY OR COMPOUNDED ITEM Hep C antibody--- need for hep c  screen 1 each 0   NONFORMULARY OR COMPOUNDED ITEM Cbcd , cmp, lipid----- dx hyperlipidemia, htn, dm II 1 each 0   Omega-3 Fatty Acids (FISH OIL) 1000 MG CAPS Take by mouth.     omeprazole (PRILOSEC) 40 MG capsule TAKE 1 CAPSULE(40 MG) BY MOUTH DAILY 90 capsule 3   OZEMPIC, 1 MG/DOSE, 4 MG/3ML SOPN INJECT 1MG INTO THE SKIN ONCE A WEEK  9 mL 3   vitamin C (ASCORBIC ACID) 500 MG tablet Take 500 mg by mouth daily.     FLOVENT HFA 110 MCG/ACT inhaler INHALE 2 PUFFS INTO THE LUNGS TWICE DAILY 12 g 1   amoxicillin-clavulanate (AUGMENTIN) 875-125 MG tablet Take 1 tablet by mouth 2 (two) times daily. 20 tablet 0   Insulin Lispro-aabc (LYUMJEV KWIKPEN) 100 UNIT/ML KwikPen Inject 6-10 Units into the skin 2 (two) times daily before a meal. 15 mL 11   meclizine (ANTIVERT) 12.5 MG tablet Take 1 tablet (12.5 mg total) by mouth 3 (three) times daily as needed for dizziness. 30 tablet 0   No facility-administered medications prior to visit.    No Known Allergies  Review of Systems  Constitutional:  Negative for fever and malaise/fatigue.  HENT:  Positive for sore throat. Negative for congestion.        (+) Swollen Eye (Right Eye)  Eyes:  Negative for blurred vision.  Respiratory:  Positive for cough. Negative for shortness of breath.   Cardiovascular:  Negative for chest pain, palpitations and leg swelling.  Gastrointestinal:  Negative for vomiting.  Musculoskeletal:  Negative for back pain.  Skin:  Negative for rash.  Neurological:  Negative for loss of consciousness and headaches.       Objective:    Physical Exam Vitals and nursing note reviewed.  Constitutional:      General: She is not in acute distress.    Appearance: Normal appearance. She is not ill-appearing.  HENT:     Head: Normocephalic and atraumatic.     Right Ear: Tympanic membrane, ear canal and external ear normal.     Left Ear: Tympanic membrane, ear canal and external ear normal.     Nose: Congestion present.     Right  Sinus: Maxillary sinus tenderness and frontal sinus tenderness present.     Left Sinus: Maxillary sinus tenderness and frontal sinus tenderness present.     Mouth/Throat:     Pharynx: No oropharyngeal exudate.  Eyes:     Extraocular Movements: Extraocular movements intact.     Pupils: Pupils are equal, round, and reactive to light.  Neck:     Thyroid: No thyromegaly.  Cardiovascular:     Rate and Rhythm: Normal rate and regular rhythm.     Heart sounds: Normal heart sounds. No murmur heard.    No gallop.  Pulmonary:     Effort: Pulmonary effort is normal. No respiratory distress.     Breath sounds: Decreased air movement present. Decreased breath sounds (Improved w/ nebulizer) present. No wheezing or rales.     Comments: Gave neb with duoneb====  breath sounds normal after  Lymphadenopathy:     Cervical: Cervical adenopathy present.  Skin:    General: Skin is warm and dry.  Neurological:     Mental Status: She is alert and oriented to person, place, and time.  Psychiatric:        Judgment: Judgment normal.     BP 126/70 (BP Location: Right Arm, Patient Position: Sitting, Cuff Size: Normal)   Pulse 99   Temp 98.6 F (37 C) (Oral)   Resp 18   Ht 5' 3" (1.6 m)   Wt 157 lb 3.2 oz (71.3 kg)   SpO2 97%   BMI 27.85 kg/m  Wt Readings from Last 3 Encounters:  04/26/22 157 lb 3.2 oz (71.3 kg)  03/10/22 156 lb (70.8 kg)  02/07/22 155 lb 12.8 oz (70.7 kg)    Diabetic Foot Exam -  Simple   No data filed    Lab Results  Component Value Date   WBC 5.4 03/10/2022   HGB 15.0 03/10/2022   HCT 45.2 03/10/2022   PLT 157 03/10/2022   GLUCOSE 147 (H) 03/10/2022   CHOL 136 10/27/2021   TRIG 102 10/27/2021   HDL 48 10/27/2021   LDLCALC 69 10/27/2021   ALT 41 (H) 03/10/2022   AST 31 03/10/2022   NA 144 03/10/2022   K 4.3 03/10/2022   CL 104 03/10/2022   CREATININE 0.84 03/10/2022   BUN 12 03/10/2022   CO2 20 03/10/2022   TSH 1.650 03/10/2022   HGBA1C 7.1 (A) 02/07/2022     Lab Results  Component Value Date   TSH 1.650 03/10/2022   Lab Results  Component Value Date   WBC 5.4 03/10/2022   HGB 15.0 03/10/2022   HCT 45.2 03/10/2022   MCV 89 03/10/2022   PLT 157 03/10/2022   Lab Results  Component Value Date   NA 144 03/10/2022   K 4.3 03/10/2022   CO2 20 03/10/2022   GLUCOSE 147 (H) 03/10/2022   BUN 12 03/10/2022   CREATININE 0.84 03/10/2022   BILITOT 0.6 03/10/2022   ALKPHOS 89 03/10/2022   AST 31 03/10/2022   ALT 41 (H) 03/10/2022   PROT 6.5 03/10/2022   ALBUMIN 4.5 03/10/2022   CALCIUM 9.7 03/10/2022   EGFR 78 03/10/2022   Lab Results  Component Value Date   CHOL 136 10/27/2021   Lab Results  Component Value Date   HDL 48 10/27/2021   Lab Results  Component Value Date   LDLCALC 69 10/27/2021   Lab Results  Component Value Date   TRIG 102 10/27/2021   Lab Results  Component Value Date   CHOLHDL 3.1 11/19/2020   Lab Results  Component Value Date   HGBA1C 7.1 (A) 02/07/2022       Assessment & Plan:   Problem List Items Addressed This Visit       Unprioritized   Type 2 diabetes mellitus with hyperglycemia, without long-term current use of insulin ([place])   Relevant Orders   AMB Referral to Pharmacy Medication Management   Seasonal allergies   Relevant Medications   fluticasone (FLOVENT HFA) 110 MCG/ACT inhaler   Asthma, chronic, unspecified asthma severity, uncomplicated - Primary   Relevant Medications   fluticasone (FLOVENT HFA) 110 MCG/ACT inhaler   Acute non-recurrent pansinusitis    Abx per orders Con't flonase and antihistamine      Relevant Medications   amoxicillin-clavulanate (AUGMENTIN) 875-125 MG tablet   promethazine-dextromethorphan (PROMETHAZINE-DM) 6.25-15 MG/5ML syrup   fluconazole (DIFLUCAN) 150 MG tablet   Acute bronchitis    Abx per orders Con't inhalers--- will refill flovent Hold off on steroids right now due to dm       Other Visit Diagnoses     Bronchitis       Relevant  Medications   amoxicillin-clavulanate (AUGMENTIN) 875-125 MG tablet   promethazine-dextromethorphan (PROMETHAZINE-DM) 6.25-15 MG/5ML syrup      Meds ordered this encounter  Medications   fluticasone (FLOVENT HFA) 110 MCG/ACT inhaler    Sig: Inhale 2 puffs into the lungs 2 (two) times daily.    Dispense:  12 g    Refill:  1    ZERO refills remain on this prescription. Your patient is requesting advance approval of refills for this medication to PREVENT ANY MISSED DOSES   amoxicillin-clavulanate (AUGMENTIN) 875-125 MG tablet    Sig: Take 1 tablet by mouth  2 (two) times daily.    Dispense:  20 tablet    Refill:  0   promethazine-dextromethorphan (PROMETHAZINE-DM) 6.25-15 MG/5ML syrup    Sig: Take 5 mLs by mouth 4 (four) times daily as needed.    Dispense:  118 mL    Refill:  0   fluconazole (DIFLUCAN) 150 MG tablet    Sig: Take 1 tablet (150 mg total) by mouth daily. May repeat in 3 days as needed    Dispense:  2 tablet    Refill:  2    I, Ann Held, DO, personally preformed the services described in this documentation.  All medical record entries made by the scribe were at my direction and in my presence.  I have reviewed the chart and discharge instructions (if applicable) and agree that the record reflects my personal performance and is accurate and complete. 04/26/2022   I,Amber Collins,acting as a scribe for Ann Held, DO.,have documented all relevant documentation on the behalf of Ann Held, DO,as directed by  Ann Held, DO while in the presence of Ann Held, DO.    Ann Held, DO

## 2022-04-27 ENCOUNTER — Ambulatory Visit: Payer: 59

## 2022-05-02 ENCOUNTER — Ambulatory Visit (INDEPENDENT_AMBULATORY_CARE_PROVIDER_SITE_OTHER): Payer: 59 | Admitting: Pharmacist

## 2022-05-02 DIAGNOSIS — E785 Hyperlipidemia, unspecified: Secondary | ICD-10-CM | POA: Diagnosis not present

## 2022-05-02 DIAGNOSIS — E1165 Type 2 diabetes mellitus with hyperglycemia: Secondary | ICD-10-CM | POA: Diagnosis not present

## 2022-05-02 NOTE — Progress Notes (Signed)
05/02/2022 Name: Dominique [name] MRN: [id number] DOB: December 22, 1960  Chief Complaint  Patient presents with   Diabetes    Dominique [name] is a 61 y.o. year old female who was referred for medication management by their primary care provider, Carollee Herter, Alferd Apa, DO. They presented for a face to face visit today.   They were referred to the pharmacist by their PCP for assistance in managing diabetes   Patient is participating in a Managed Medicaid Plan:  NO  Subjective:  Care Team: Primary Care Provider: Carollee Herter, Alferd Apa, DO ; Next Scheduled Visit: 07/2022 Endocrinologist Dr Cruzita Lederer; Next Scheduled Visit: 06/13/2022  Medication Access/Adherence Patient reports that she his concerned that she will run out of Omni Pod pumps because she has to change more frequently that eery 72 hours.  She is not currently counting carbohydrates and would like education on how to count carbohydrates  Current Pharmacy:  [id number] [address], [address] AT [facility] OF [address] [place] [address] Phone: [phone] Fax: [phone]   Patient reports affordability concerns with their medications: Yes  Patient reports access/transportation concerns to their pharmacy: No  Patient reports adherence concerns with their medications:   see above regarding OmniPod pump.       Diabetes:  Current medications:  Medications tried in the past: Trulicity (rash), Jardiance (persistent yeast infections), metformin (diarrhea)   Patient also was not able to tolerate Ozempic at 40m per week dose but she is taking ozempic 178mper week.   Patient reports that she has gained about 10 to 15 lbs since she started insulin 1 year ago.  Using DexCom 6 meter and Omni Pod insulin pump (automated function) Current settings for Omni Pod:  Bolus Rate: 1.25 u/hr Max bolus rate 2.5 u/hr Target blood glucose 110; Corrects if > 120 mg/dL Insulin to  CHO ratio: 1 g / unit of insulin Correction Factor: 28 mg/dL/unit of insulin Duration of insulin action: 4 hours.    Patient reports hypoglycemic s/sx of shakiness after she eats sweets (which is does not do often).  Patient denies hyperglycemic symptoms including polyuria, polydipsia, polyphagia, nocturia, neuropathy, blurred vision.   Health Maintenance  Health Maintenance Due  Topic Date Due   HIV Screening  Never done   Hepatitis C Screening  Never done   COLONOSCOPY (Pts 45-491yrnsurance coverage will need to be confirmed)  Never done   MAMMOGRAM  08/08/2012   PAP SMEAR-Modifier  08/08/2013   Diabetic kidney evaluation - Urine ACR  02/14/2020   INFLUENZA VACCINE  12/14/2021   COVID-19 Vaccine (3 - 2023-24 season) 01/14/2022     Objective: Lab Results  Component Value Date   HGBA1C 7.1 (A) 02/07/2022    Lab Results  Component Value Date   CREATININE 0.84 03/10/2022   BUN 12 03/10/2022   NA 144 03/10/2022   K 4.3 03/10/2022   CL 104 03/10/2022   CO2 20 03/10/2022    Lab Results  Component Value Date   CHOL 136 10/27/2021   HDL 48 10/27/2021   LDLCALC 69 10/27/2021   TRIG 102 10/27/2021   CHOLHDL 3.1 11/19/2020    Medications Reviewed Today     Reviewed by EckCherre RobinsPH-CPP (Pharmacist) on 05/02/22 at 095[place] List Status: <None>   Medication Order Taking? Sig Documenting Provider Last Dose Status Informant  albuterol (VENTOLIN HFA) 108 (90 Base) MCG/ACT inhaler [id number] INHALE 2  PUFFS INTO THE LUNGS EVERY 6 HOURS AS NEEDED FOR WHEEZING OR SHORTNESS OF BREATH Ann Held, DO Taking Active   amoxicillin-clavulanate (AUGMENTIN) 875-125 MG tablet [id number] Yes Take 1 tablet by mouth 2 (two) times daily. Roma Schanz R, DO Taking Active   beclomethasone (QVAR REDIHALER) 40 MCG/ACT inhaler [id number]  Inhale 2 puffs into the lungs 2 (two) times daily. Carollee Herter, Kendrick Fries R, DO  Active   Blood Glucose Monitoring Suppl (CONTOUR NEXT ONE)  KIT [id number] No 1 kit by Does not apply route See admin instructions. For checking blood sugar 2 times a day  Patient not taking: Reported on 05/02/2022   Philemon Kingdom, MD Not Taking Active   cetirizine (ZYRTEC) 10 MG tablet [id number] Yes Take 1 tablet (10 mg total) by mouth daily. Roma Schanz R, DO Taking Active   Cholecalciferol (VITAMIN D3 PO) [id number] Yes Take 50 mcg by mouth daily. [provider] Taking Active   Continuous Blood Gluc Sensor ([place]) [place] [id number] Yes Use as instructed to check blood sugar. Change every 10 days Philemon Kingdom, MD Taking Active   Continuous Blood Gluc Transmit (DEXCOM G6 TRANSMITTER) MISC [id number] Yes Use as instructed to check blood sugar. Change every 90 days. Philemon Kingdom, MD Taking Active   ezetimibe (ZETIA) 10 MG tablet [id number] Yes TAKE 1 TABLET(10 MG) BY MOUTH DAILY Ann Held, DO Taking Active   fenofibrate 160 MG tablet [id number] Yes 1 po qd Carollee Herter, Alferd Apa, DO Taking Active   fluconazole (DIFLUCAN) 150 MG tablet [id number]  1 po x1, may repeat in 3 days prn Carollee Herter, Alferd Apa, DO  Active   fluticasone (FLONASE) 50 MCG/ACT nasal spray [id number] Yes SHAKE LIQUID AND USE 2 SPRAYS IN [facility] NOSTRIL DAILY Ann Held, DO Taking Active   fluticasone (FLOVENT HFA) 110 MCG/ACT inhaler [id number]  Inhale 2 puffs into the lungs 2 (two) times daily. Roma Schanz R, DO  Active   glucose blood (CONTOUR NEXT TEST) test strip [id number] No USE TO CHECK BLOOD SUGAR TWICE DAILY  Patient not taking: Reported on 05/02/2022   Philemon Kingdom, MD Not Taking Active   Insulin Disposable Pump (OMNIPOD 5 G6 POD, GEN 5,) MISC [id number] Yes 1 each by Does not apply route every 3 (three) days. Philemon Kingdom, MD Taking Active   Insulin Lispro-aabc (LYUMJEV) 100 UNIT/ML SOLN [id number] Yes Use up to 80 units daily and insulin pump Philemon Kingdom, MD Taking Active   Insulin Pen Needle (PEN NEEDLES)  33G X 4 MM MISC [id number]  Use 3x a day Philemon Kingdom, MD  Active   levalbuterol [facility]) 1.25 MG/3ML nebulizer solution [id number] Yes USE ONE VIAL BY NEBULIZATION EVERY 6 HOURS AS NEEDED FOR WHEEZING OR SHORTNESS OF BREATH Ann Held, DO Taking Active   losartan (COZAAR) 100 MG tablet [id number] Yes Take 1 tablet (100 mg total) by mouth daily. Roma Schanz R, DO Taking Active   magnesium 30 MG tablet [id number] Yes Take by mouth daily. [provider] Taking Active   Microlet [place] [id number]  TEST BLOOD SUGAR TWICE DAILY Philemon Kingdom, MD  Active   montelukast (SINGULAIR) 10 MG tablet [id number] Yes Take 1 tablet (10 mg total) by mouth at bedtime. Roma Schanz R, DO Taking Active   NONFORMULARY OR COMPOUNDED ITEM [id number]  Cmp, lipid   Dx hyperlipidemia Ann Held, DO  Active   NONFORMULARY OR COMPOUNDED ITEM [id number]  Cmp, lipid---  dx htn and hyperlipidemia Microalbumin---  dx DM II Ann Held, DO  Active   NONFORMULARY OR COMPOUNDED ITEM [id number]  Hep C antibody--- need for hep c screen Ann Held, DO  Active   NONFORMULARY OR COMPOUNDED ITEM [id number]  Cbcd , cmp, lipid----- dx hyperlipidemia, htn, dm II Carollee Herter, [place] R, DO  Active   Omega-3 Fatty Acids (FISH OIL) 1000 MG CAPS [id number]  Take by mouth. [provider]  Active   omeprazole (PRILOSEC) 40 MG capsule [id number]  TAKE 1 CAPSULE(40 MG) BY MOUTH DAILY Carollee Herter, Alferd Apa, DO  Active   OZEMPIC, 1 MG/DOSE, 4 MG/3ML SOPN [id number] Yes INJECT 1MG INTO THE SKIN ONCE A Marcheta Grammes, MD Taking Active   promethazine-dextromethorphan (PROMETHAZINE-DM) 6.25-15 MG/5ML syrup [id number] Yes Take 5 mLs by mouth 4 (four) times daily as needed. Ann Held, DO Taking Active   vitamin C (ASCORBIC ACID) 500 MG tablet [id number] Yes Take 500 mg by mouth daily. [provider] Taking Active               Assessment/Plan:    Diabetes - almost at goal of < 7.0% A1c - Reviewed goal A1c, goal fasting, and goal 2 hour post prandial glucose - Reviewed CHO counting. Provided written information about how to estimate CHO intake for various foods. Also recommended download an app for phone like Mynetdiary, FreeCarbCounter or MyFitnessPal  - Recommend to start using CHO couting function with her OmniPod System - patient will see Dr Cruzita Lederer 06/13/2022   - Could consider change to [facility]   Hyperlipidemia/ASCVD Risk Reduction: Currently controlled but atorvastatin was stopped and ezetimibe started recently due to slight bump in AST. - Reviewed long term complications of uncontrolled cholesterol - Recommend to continue ezetimibe and fenofibrate.  - Consider restarting statin after recheck LFTs. Statins could slightly bump LFTS but rarely need to stop. Could try stopping fenofibrate or lowering dose of fenofibrate to 45 to 61m daily and restart low dose statin like rosuvastatin 166m   Follow Up Plan: Check back in with patient in 2024 to make sure not questions about CHO counting.   TaCherre RobinsPharmD Clinical Pharmacist [facility]

## 2022-05-24 ENCOUNTER — Encounter: Payer: Self-pay | Admitting: Internal Medicine

## 2022-05-25 ENCOUNTER — Other Ambulatory Visit: Payer: Self-pay

## 2022-05-25 ENCOUNTER — Other Ambulatory Visit: Payer: Self-pay | Admitting: Internal Medicine

## 2022-05-25 DIAGNOSIS — Z794 Long term (current) use of insulin: Secondary | ICD-10-CM

## 2022-05-25 MED ORDER — CONTOUR NEXT TEST VI STRP: ORAL_STRIP | 0 refills | Status: DC

## 2022-05-25 MED ORDER — OMNIPOD 5 DEXG7G6 PODS GEN 5 MISC: 1.0000 | 3 refills | Status: DC

## 2022-06-13 ENCOUNTER — Encounter: Payer: Self-pay | Admitting: Internal Medicine

## 2022-06-13 ENCOUNTER — Ambulatory Visit: Payer: 59 | Admitting: Internal Medicine

## 2022-06-13 VITALS — BP 110/72 | HR 92 | Ht 63.0 in | Wt 157.4 lb

## 2022-06-13 DIAGNOSIS — E1165 Type 2 diabetes mellitus with hyperglycemia: Secondary | ICD-10-CM

## 2022-06-13 DIAGNOSIS — Z23 Encounter for immunization: Secondary | ICD-10-CM

## 2022-06-13 DIAGNOSIS — Z794 Long term (current) use of insulin: Secondary | ICD-10-CM

## 2022-06-13 DIAGNOSIS — E785 Hyperlipidemia, unspecified: Secondary | ICD-10-CM

## 2022-06-13 LAB — POCT GLYCOSYLATED HEMOGLOBIN (HGB A1C): Hemoglobin A1C: 6.4 % — AB (ref 4.0–5.6)

## 2022-06-13 MED ORDER — LYUMJEV 100 UNIT/ML IJ SOLN: INTRAMUSCULAR | 5 refills | Status: DC

## 2022-06-13 MED ORDER — OMNIPOD 5 DEXG7G6 PODS GEN 5 MISC: 1.0000 | 3 refills | Status: DC

## 2022-06-13 NOTE — Progress Notes (Signed)
Patient ID: Dominique [name], female   DOB: 02-27-1961, 63 y.o.   MRN: [id number]   HPI: Dominique [name] is a 62 y.o.-year-old female, returning for follow-up for DM2, dx in 2016, but GDM in 2000, insulin-dependent, uncontrolled, without long term complications.  She previously saw Dr. Loanne Drilling.  Last visit with me 4 months ago.  Interim history: Since last visit, she started on an insulin pump-OmniPod 5.  She had to downgrade her CGM to be compatible with the pump. She has blurry vision.  She also mentions weight gain, but no nausea or increased urination. She and her family had an upper respiratory infection recently along with gastroenteritis.  Sugars were higher.  Reviewed HbA1c levels: Lab Results  Component Value Date   HGBA1C 7.1 (A) 02/07/2022   HGBA1C 7.1 (A) 10/20/2021   HGBA1C 7.1 (A) 06/17/2021   HGBA1C 6.6 (A) 02/12/2021   HGBA1C 6.7 (A) 10/07/2020   HGBA1C 6.7 (A) 06/04/2020   HGBA1C 6.0 (H) 11/25/2019   HGBA1C 6.3 (A) 06/19/2019   HGBA1C 7.1 (A) 02/12/2019   HGBA1C 6.6 (A) 10/12/2018  HbA1c 8.9%  At last visit she was on: -  >> stopped 10/2021 - Ozempic 0.5 >> 1 mg weekly  (had nausea with 2 mg dose) - Lantus 10 units in HS - added 06/2021 >> 34 >> 40 >> 48 units in HS - Lyumjev 6-10 units before B and D - added 10/2021 We had to stop Jardiance 07/2018 due to persistent yeast infections. She stopped metformin IR due to lack of effect.  We had to stop metformin ER due to nausea. She developed a rash with Trulicity She was previously on nateglinide and also on repaglinide.  Currently on OmniPod 5 CGM (with Lyumjev): - Basal rates: 12 am: 1.25 units/h - Insulin to carb ratio: 12 am: 1:1 - Target: 12 am: 110-120 - Correction factor (insulin sensitivity factor):  12 am: 28 - Active insulin time: 4h - Changes infusion site: q3 days  Total daily dose from basal insulin: 43.2 units (67%) Total daily dose from bolus insulin: 21.4 units (33%) Total daily dose  64.6-80 units daily  Pt checks her sugars  >4x a day with her Dexcom G6 CGM:   Previously:  Prev.:   Lowest sugar was 78 ...  >> 131 >> 135 >> 125 >> 82 >> 90; she has hypoglycemia awareness in the 90s. Highest sugar was 205 >> 198 >> 251 >> 300 >> 320 >> 300.  Glucometer: Accu-Chek guide  Pt's meals are: - Breakfast: 1-2 egg, 1-2 toast; egg + bacon - Lunch: eating out, salad, meat + veggies - Dinner: eating out, salad, meat + veggies - Dessert: apple pie, cake - Snacks: chips  -No CKD, last BUN/creatinine:  Lab Results  Component Value Date   BUN 12 03/10/2022   BUN 13 10/27/2021   CREATININE 0.84 03/10/2022   CREATININE 0.80 10/27/2021  On losartan 100.  -+ HL; last set of lipids: Lab Results  Component Value Date   CHOL 136 10/27/2021   HDL 48 10/27/2021   LDLCALC 69 10/27/2021   TRIG 102 10/27/2021   CHOLHDL 3.1 11/19/2020  On Lipitor 10, fenofibrate 160, fish oil 1000 mg 2x a day.  - last eye exam was in 07/2021: No DR; Dr. Sabra Heck. Sees an ophthalmic surgeon >> contemplates palpebral surgery. On Xiidra and fish oil for dry eyes.  -No numbness and tingling in her feet.  She saw neurology -no peripheral neuropathy.  Last foot exam 02/07/2022.  Off Gabapentin - prev. For Bell's palsy. She has a history of a kidney stone in 2019.  Pt has FH of DM in mother and father.  ROS: + see HPI Neurological: no tremors/+ numbness - top of L foot/no tingling/no dizziness  I reviewed pt's medications, allergies, PMH, social hx, family hx, and changes were documented in the history of present illness. Otherwise, unchanged from my initial visit note.  Past Medical History:  Diagnosis Date   Arthritis    left foot   Asthma    Bell palsy 2015   Diabetes mellitus without complication ([place])    type 2   Endometriosis    GERD (gastroesophageal reflux disease)    H/O hiatal hernia    Histoplasmosis 2002   history of, [place] raised   Hypertension    Kidney stone     Kidney stones    Past Surgical History:  Procedure Laterality Date   FOOT ARTHRODESIS Left 09/05/2019   Procedure: Left Modified [place], Left First and Second Tarsometatarsal Joint Arthrodesis;  Surgeon: Wylene Simmer, MD;  Location: [facility];  Service: Orthopedics;  Laterality: Left;   laparatomy  1985   RADIAL HEAD ARTHROPLASTY Left 02/23/2013   Procedure: LEFT RADIAL HEAD ARTHROPLASTY qwith ligament reconstruction;  Surgeon: Roseanne Kaufman, MD;  Location: [facility];  Service: Orthopedics;  Laterality: Left;   TONSILLECTOMY AND ADENOIDECTOMY  1967   uterine lining ablation  2002   Social History   Socioeconomic History   Marital status: Married    Spouse name: Not on file   Number of children: Not on file   Years of education: Not on file   Highest education level: Not on file  Occupational History    Comment: unemployed  Tobacco Use   Smoking status: Never   Smokeless tobacco: Never  Substance and Sexual Activity   Alcohol use: No   Drug use: No   Sexual activity: Yes    Partners: Male  Other Topics Concern   Not on file  Social History Narrative   Exercise-- no   Social Determinants of Health   Financial Resource Strain: Not on file  Food Insecurity: Not on file  Transportation Needs: Not on file  Physical Activity: Not on file  Stress: Not on file  Social Connections: Not on file  Intimate Partner Violence: Not on file   Current Outpatient Medications on File Prior to Visit  Medication Sig Dispense Refill   albuterol (VENTOLIN HFA) 108 (90 Base) MCG/ACT inhaler INHALE 2 PUFFS INTO THE LUNGS EVERY 6 HOURS AS NEEDED FOR WHEEZING OR SHORTNESS OF BREATH 20.1 g 1   amoxicillin-clavulanate (AUGMENTIN) 875-125 MG tablet Take 1 tablet by mouth 2 (two) times daily. 20 tablet 0   beclomethasone (QVAR REDIHALER) 40 MCG/ACT inhaler Inhale 2 puffs into the lungs 2 (two) times daily. 1 each 3   Blood Glucose Monitoring Suppl (CONTOUR NEXT ONE) KIT 1 kit by Does  not apply route See admin instructions. For checking blood sugar 2 times a day (Patient not taking: Reported on 05/02/2022) 1 kit 0   cetirizine (ZYRTEC) 10 MG tablet Take 1 tablet (10 mg total) by mouth daily. 30 tablet 11   Cholecalciferol (VITAMIN D3 PO) Take 50 mcg by mouth daily.     Continuous Blood Gluc Sensor (DEXCOM G6 SENSOR) MISC Use as instructed to check blood sugar. Change every 10 days 9 each 3   Continuous Blood Gluc Transmit (DEXCOM G6 TRANSMITTER) MISC Use as instructed to check blood sugar.  Change every 90 days. 1 each 3   ezetimibe (ZETIA) 10 MG tablet TAKE 1 TABLET(10 MG) BY MOUTH DAILY 90 tablet 1   fenofibrate 160 MG tablet 1 po qd 90 tablet 1   fluconazole (DIFLUCAN) 150 MG tablet 1 po x1, may repeat in 3 days prn 2 tablet 0   fluticasone (FLONASE) 50 MCG/ACT nasal spray SHAKE LIQUID AND USE 2 SPRAYS IN EACH NOSTRIL DAILY 48 g 3   fluticasone (FLOVENT HFA) 110 MCG/ACT inhaler Inhale 2 puffs into the lungs 2 (two) times daily. 12 g 1   glucose blood (CONTOUR NEXT TEST) test strip USE TO CHECK BLOOD SUGAR TWICE DAILY 200 strip 0   Insulin Disposable Pump (OMNIPOD 5 G6 POD, GEN 5,) MISC 1 each by Does not apply route every other day. 60 each 3   Insulin Lispro-aabc (LYUMJEV) 100 UNIT/ML SOLN Use up to 80 units daily and insulin pump 30 mL 5   Insulin Pen Needle (PEN NEEDLES) 33G X 4 MM MISC Use 3x a day 300 each 3   levalbuterol (XOPENEX) 1.25 MG/3ML nebulizer solution USE ONE VIAL BY NEBULIZATION EVERY 6 HOURS AS NEEDED FOR WHEEZING OR SHORTNESS OF BREATH 675 mL 1   losartan (COZAAR) 100 MG tablet Take 1 tablet (100 mg total) by mouth daily. 90 tablet 1   magnesium 30 MG tablet Take by mouth daily.     Microlet Lancets MISC TEST BLOOD SUGAR TWICE DAILY 200 each 3   montelukast (SINGULAIR) 10 MG tablet Take 1 tablet (10 mg total) by mouth at bedtime. 90 tablet 1   NONFORMULARY OR COMPOUNDED ITEM Cmp, lipid   Dx hyperlipidemia 1 each 0   NONFORMULARY OR COMPOUNDED ITEM Cmp,  lipid--- dx htn and hyperlipidemia Microalbumin---  dx DM II 1 each 0   NONFORMULARY OR COMPOUNDED ITEM Hep C antibody--- need for hep c screen 1 each 0   NONFORMULARY OR COMPOUNDED ITEM Cbcd , cmp, lipid----- dx hyperlipidemia, htn, dm II 1 each 0   Omega-3 Fatty Acids (FISH OIL) 1000 MG CAPS Take by mouth.     omeprazole (PRILOSEC) 40 MG capsule TAKE 1 CAPSULE(40 MG) BY MOUTH DAILY 90 capsule 3   OZEMPIC, 1 MG/DOSE, 4 MG/3ML SOPN INJECT 1MG  INTO THE SKIN ONCE A WEEK 9 mL 3   promethazine-dextromethorphan (PROMETHAZINE-DM) 6.25-15 MG/5ML syrup Take 5 mLs by mouth 4 (four) times daily as needed. 118 mL 0   vitamin C (ASCORBIC ACID) 500 MG tablet Take 500 mg by mouth daily.     No current facility-administered medications on file prior to visit.   Allergies  Allergen Reactions   Jardiance [Empagliflozin] Other (See Comments)    Persistent yeast infection    Metformin And Related Diarrhea   Pravastatin Other (See Comments)    Increased blood glucose?    Family History  Problem Relation Age of Onset   Diabetes Mother    Hyperlipidemia Mother    Hypertension Mother    Diabetes Father    Hyperlipidemia Father    Hypertension Father     PE: BP 110/72 (BP Location: Left Arm, Patient Position: Sitting, Cuff Size: Normal)   Pulse 92   Ht 5\' 3"  (1.6 m)   Wt 157 lb 6.4 oz (71.4 kg)   SpO2 96%   BMI 27.88 kg/m  Wt Readings from Last 3 Encounters:  06/13/22 157 lb 6.4 oz (71.4 kg)  04/26/22 157 lb 3.2 oz (71.3 kg)  03/10/22 156 lb (70.8 kg)   Constitutional: normal weight  but truncal adipose disposition, in NAD Eyes: EOMI, no exophthalmos ENT: no thyromegaly, no cervical lymphadenopathy Cardiovascular: RRR, No MRG Respiratory: CTA B Musculoskeletal: no deformities Skin: moist, warm, no rashes Neurological: no tremor with outstretched hands  ASSESSMENT: 1. DM2, insulin-dependent, uncontrolled, without long-term complications, but with hyperglycemia  Component     Latest Ref  Rng 02/09/2022  Glutamic Acid Decarb Ab     0.0 - 5.0 U/mL <5.0   ZNT8 Antibodies     U/mL <15   Glucose, Plasma     70 - 99 mg/dL 123 (H)   C-Peptide     1.1 - 4.4 ng/mL 4.2    No evidence of pancreatic autoimmunity or insulin deficiency.  2. HL  PLAN:  1. Patient with longstanding, uncontrolled, type 2 diabetes, on GLP-1 receptor agonist and previously on a basal-bolus insulin regimen, but now on an insulin pump started since last visit.  She is on the OmniPod 5 pump integrated with the Dexcom G6 CGM.  She has Lyumjev in the pump.  She could not tolerate metformin due to nausea and SGLT2 inhibitors due to yeast infections. -At last visit, we checked her for insulin deficiency and pancreatic antibodies and investigation was negative. -Also, at last visit, we discussed about increasing her insulin before breakfast and dinner and introducing Lyumjev before lunch.  We did not change her Lantus dose.  However, I suggested an insulin pump, which she was able to start since then.  HbA1c obtained at last visit 7.1%, but lower than expected from her CGM. CGM interpretation: -At today's visit, we reviewed her CGM downloads: It appears that 71% of values are in target range (goal >70%), while 29% are higher than 180 (goal <25%), and 0% are lower than 70 (goal <4%).  The calculated average blood sugar is 169.  The projected HbA1c for the next 3 months (GMI) is 7.3%. -Reviewing the CGM trends, sugars appear to be higher in the last 2 weeks compared to the previous 2 weeks, most likely due to her gastroenteritis + URI.  Sugars are increasing after breakfast and dinner and we discussed about the need to strengthen her insulin to carb ratios.  However, she is not comfortable counting carbs so she uses a 1: 1 ICR.  In this case, I advised her to try to enter more carbs into the pump for the meals especially when sick, less active, or eating larger meals.  I also advised her to increase her basal rates since  sugars stay slightly higher than target throughout the day and night.  I introduced the pump setting changes into the pump for her. -Will continue Ozempic for now.  She tolerates it well.  She does not feel that she would tolerate the higher dose well due to GI symptoms. - I suggested to:  Patient Instructions  Please continue: - Ozempic 1 mg weekly  Please use the following pump settings: - Basal rates: 12 am: 1.25 >> 1.35 units/h - Insulin to carb ratio: 12 am: 1:1 (enter 8-12 g carbs) - Target: 12 am: 110-120 - Correction factor (insulin sensitivity factor):  12 am: 28 - Active insulin time: 4h - Changes infusion site: q2 days  Please come back for a follow-up appointment in 3-4 months.  - we checked her HbA1c: 6.4% much better) - advised to check sugars at different times of the day - 4x a day, rotating check times - advised for yearly eye exams >> she is UTD - return to clinic in 3-4  months  2. HL -R reviewed latest lipid panel from 10/2021: All fractions at goal: Lab Results  Component Value Date   CHOL 136 10/27/2021   HDL 48 10/27/2021   LDLCALC 69 10/27/2021   TRIG 102 10/27/2021   CHOLHDL 3.1 11/19/2020  -She continues on Lipitor 10 mg daily, fenofibrate 160 mg daily, without side effects.  She is also on fish oil 1000 mg twice a day, but this is for dry eyes.  LABS NEED TO GO TO [facility] -patient's husband works there.  + Flu shot today  Philemon Kingdom, MD PhD [facility]

## 2022-06-13 NOTE — Patient Instructions (Addendum)
Please continue: - Ozempic 1 mg weekly  Please use the following pump settings: - Basal rates: 12 am: 1.25 >> 1.35 units/h - Insulin to carb ratio: 12 am: 1:1 (enter 8-12 g carbs) - Target: 12 am: 110-120 - Correction factor (insulin sensitivity factor):  12 am: 28 - Active insulin time: 4h - Changes infusion site: q2 days  Please come back for a follow-up appointment in 3-4 months.

## 2022-07-12 ENCOUNTER — Telehealth: Payer: Self-pay | Admitting: Pharmacist

## 2022-07-12 NOTE — Telephone Encounter (Signed)
Reached out to patient to see if she had questions about carbohydrates counting. I had met with her for diabetes education, especially diet and CHO counting on 05/02/2022.  Patient reports she is using OmniPod 5 insulin pump with DexCom G6. She last saw Dr Cruzita Lederer 06/13/2022.  A1c is not down to 6.4%.  Patient states she feels she is doing well with CHO counting since A1c has improved. Though sometimes she does not feel 100% sure of CHO in foods.  Encouraged her to continue to use phone or apps to estimate CHO content of popular foods. Patient to continue to follow up with Dr. Cruzita Lederer.  She can contact me if needed in future for medication or diabetes related questions as well.   Cherre Robins, PharmD Clinical Pharmacist [facility].

## 2022-08-01 ENCOUNTER — Encounter: Payer: Self-pay | Admitting: Internal Medicine

## 2022-08-04 ENCOUNTER — Encounter: Payer: Self-pay | Admitting: Internal Medicine

## 2022-08-28 ENCOUNTER — Other Ambulatory Visit: Payer: Self-pay | Admitting: Internal Medicine

## 2022-08-28 DIAGNOSIS — E1165 Type 2 diabetes mellitus with hyperglycemia: Secondary | ICD-10-CM

## 2022-08-31 LAB — HM DIABETES EYE EXAM

## 2022-09-06 ENCOUNTER — Encounter: Payer: Self-pay | Admitting: Internal Medicine

## 2022-09-06 ENCOUNTER — Ambulatory Visit: Payer: 59 | Admitting: Internal Medicine

## 2022-09-06 VITALS — BP 120/84 | HR 83 | Ht 63.0 in | Wt 157.4 lb

## 2022-09-06 DIAGNOSIS — E785 Hyperlipidemia, unspecified: Secondary | ICD-10-CM

## 2022-09-06 DIAGNOSIS — Z794 Long term (current) use of insulin: Secondary | ICD-10-CM | POA: Diagnosis not present

## 2022-09-06 DIAGNOSIS — E1165 Type 2 diabetes mellitus with hyperglycemia: Secondary | ICD-10-CM | POA: Diagnosis not present

## 2022-09-06 LAB — POCT GLYCOSYLATED HEMOGLOBIN (HGB A1C): Hemoglobin A1C: 6.7 % — AB (ref 4.0–5.6)

## 2022-09-06 MED ORDER — OMNIPOD 5 DEXG7G6 PODS GEN 5 MISC: 1.0000 | 3 refills | Status: DC

## 2022-09-06 NOTE — Patient Instructions (Signed)
Please continue: - Ozempic 1 mg weekly  Please use the following pump settings: - Basal rates: 12 am: 1.35 - Insulin to carb ratio: 12 am: 1:1 (enter 12-16 g carbs) - Target: 12 am: 110-120 - Correction factor (insulin sensitivity factor):  12 am: 28 - Active insulin time: 4h - Changes infusion site: q2 days  Please come back for a follow-up appointment in 4 months.

## 2022-09-06 NOTE — Progress Notes (Signed)
Patient ID: Dominique [name], female   DOB: [birth year], 62 y.o.   MRN: [id number]   HPI: Dominique [name] is a 62 y.o.-year-old female, returning for follow-up for DM2, dx in 2016, but GDM in 2000, insulin-dependent, uncontrolled, without long term complications.  She previously saw Dr. Everardo All.  Last visit with me 3 months ago.  Interim history: She continues to have blurry vision and increased lacrimation in the right eye.   She also mentions weight gain,  increased urination.  No nausea, diarrhea. No chest pain.  Reviewed HbA1c levels: Lab Results  Component Value Date   HGBA1C 6.4 (A) 06/13/2022   HGBA1C 7.1 (A) 02/07/2022   HGBA1C 7.1 (A) 10/20/2021   HGBA1C 7.1 (A) 06/17/2021   HGBA1C 6.6 (A) 02/12/2021   HGBA1C 6.7 (A) 10/07/2020   HGBA1C 6.7 (A) 06/04/2020   HGBA1C 6.0 (H) 11/25/2019   HGBA1C 6.3 (A) 06/19/2019   HGBA1C 7.1 (A) 02/12/2019  HbA1c 8.9%  At last visit she was on: -  >> stopped 10/2021 - Ozempic 0.5 >> 1 mg weekly  (had nausea with 2 mg dose) - Lantus 10 units in HS - added 06/2021 >> 34 >> 40 >> 48 units in HS - Lyumjev 6-10 units before B and D - added 10/2021 We had to stop Jardiance 07/2018 due to persistent yeast infections. She stopped metformin IR due to lack of effect.  We had to stop metformin ER due to nausea. She developed a rash with Trulicity She was previously on nateglinide and also on repaglinide.  Currently on OmniPod 5 CGM (with Lyumjev): - Basal rates: 12 am: 1.25 >> 1.35 units/h - Insulin to carb ratio: 12 am: 1:1 (enter 8-12 g carbs) - Target: 12 am: 110-120 - Correction factor (insulin sensitivity factor):  12 am: 28 - Active insulin time: 4h - Changes infusion site: q2 days  Total daily dose from basal insulin: 43.2 units (67%) >> 57% (43 units) Total daily dose from bolus insulin: 21.4 units (33%) >> 43% (33 units) Total daily dose 74-80 units daily  Pt checks her sugars  >4x a day with her Dexcom G6  CGM:  Previously:   Previously:  Lowest sugar was 82 >> 90 >> 80s; she has hypoglycemia awareness in the 90s. Highest sugar was 320 >> 300 >> 200s.  Glucometer: Accu-Chek guide  Pt's meals are: - Breakfast: 1-2 egg, 1-2 toast; egg + bacon - Lunch: eating out, salad, meat + veggies - Dinner: eating out, salad, meat + veggies - Dessert: apple pie, cake - Snacks: chips  -No CKD, last BUN/creatinine:  Lab Results  Component Value Date   BUN 12 03/10/2022   BUN 13 10/27/2021   CREATININE 0.84 03/10/2022   CREATININE 0.80 10/27/2021  On losartan 100.  -+ HL; last set of lipids: Lab Results  Component Value Date   CHOL 136 10/27/2021   HDL 48 10/27/2021   LDLCALC 69 10/27/2021   TRIG 102 10/27/2021   CHOLHDL 3.1 11/19/2020  On Lipitor 10, fenofibrate 160, fish oil 1000 mg 2x a day.  - last eye exam was in 08/2022: No DR reportedly; Dr. Hyacinth Meeker. Sees an ophthalmic surgeon >> contemplates palpebral surgery. On Xiidra and fish oil for dry eyes.  -No numbness and tingling in her feet.  She saw neurology -no peripheral neuropathy.  Last foot exam 02/07/2022.  Off Gabapentin - prev. For Bell's palsy. She has a history of a kidney stone in 2019.  Pt has FH of DM in mother  and father.  ROS: + see HPI Neurological: no tremors/+ numbness - top of L foot/no tingling/no dizziness  I reviewed pt's medications, allergies, PMH, social hx, family hx, and changes were documented in the history of present illness. Otherwise, unchanged from my initial visit note.  Past Medical History:  Diagnosis Date   Arthritis    left foot   Asthma    Bell palsy 2015   Diabetes mellitus without complication (HCC)    type 2   Endometriosis    GERD (gastroesophageal reflux disease)    H/O hiatal hernia    Histoplasmosis 2002   history of, [place] raised   Hypertension    Kidney stone    Kidney stones    Past Surgical History:  Procedure Laterality Date   FOOT ARTHRODESIS Left 09/05/2019    Procedure: Left Modified McBride, Left First and Second Tarsometatarsal Joint Arthrodesis;  Surgeon: Toni Arthurs, MD;  Location: [facility];  Service: Orthopedics;  Laterality: Left;   laparatomy  1985   RADIAL HEAD ARTHROPLASTY Left 02/23/2013   Procedure: LEFT RADIAL HEAD ARTHROPLASTY qwith ligament reconstruction;  Surgeon: Dominica Severin, MD;  Location: [facility] OR;  Service: Orthopedics;  Laterality: Left;   TONSILLECTOMY AND ADENOIDECTOMY  1967   uterine lining ablation  2002   Social History   Socioeconomic History   Marital status: Married    Spouse name: Not on file   Number of children: Not on file   Years of education: Not on file   Highest education level: Not on file  Occupational History    Comment: unemployed  Tobacco Use   Smoking status: Never   Smokeless tobacco: Never  Substance and Sexual Activity   Alcohol use: No   Drug use: No   Sexual activity: Yes    Partners: Male  Other Topics Concern   Not on file  Social History Narrative   Exercise-- no   Social Determinants of Health   Financial Resource Strain: Not on file  Food Insecurity: Not on file  Transportation Needs: Not on file  Physical Activity: Not on file  Stress: Not on file  Social Connections: Not on file  Intimate Partner Violence: Not on file   Current Outpatient Medications on File Prior to Visit  Medication Sig Dispense Refill   albuterol (VENTOLIN HFA) 108 (90 Base) MCG/ACT inhaler INHALE 2 PUFFS INTO THE LUNGS EVERY 6 HOURS AS NEEDED FOR WHEEZING OR SHORTNESS OF BREATH 20.1 g 1   beclomethasone (QVAR REDIHALER) 40 MCG/ACT inhaler Inhale 2 puffs into the lungs 2 (two) times daily. 1 each 3   Blood Glucose Monitoring Suppl (CONTOUR NEXT ONE) KIT 1 kit by Does not apply route See admin instructions. For checking blood sugar 2 times a day 1 kit 0   cetirizine (ZYRTEC) 10 MG tablet Take 1 tablet (10 mg total) by mouth daily. 30 tablet 11   Cholecalciferol (VITAMIN D3 PO) Take 50  mcg by mouth daily.     Continuous Blood Gluc Sensor (DEXCOM G6 SENSOR) MISC Use as instructed to check blood sugar. Change every 10 days 9 each 3   Continuous Blood Gluc Transmit (DEXCOM G6 TRANSMITTER) MISC Use as instructed to check blood sugar. Change every 90 days. 1 each 3   ezetimibe (ZETIA) 10 MG tablet TAKE 1 TABLET(10 MG) BY MOUTH DAILY 90 tablet 1   fenofibrate 160 MG tablet 1 po qd 90 tablet 1   fluticasone (FLONASE) 50 MCG/ACT nasal spray SHAKE LIQUID AND USE 2 SPRAYS  IN EACH NOSTRIL DAILY 48 g 3   fluticasone (FLOVENT HFA) 110 MCG/ACT inhaler Inhale 2 puffs into the lungs 2 (two) times daily. 12 g 1   glucose blood (CONTOUR NEXT TEST) test strip USE TO CHECK BLOOD SUGAR TWICE DAILY 200 strip 5   Insulin Disposable Pump (OMNIPOD 5 G6 POD, GEN 5,) MISC 1 each by Does not apply route every other day. 60 each 3   Insulin Lispro-aabc (LYUMJEV) 100 UNIT/ML SOLN Use up to 80 units daily and insulin pump 40 mL 5   Insulin Pen Needle (PEN NEEDLES) 33G X 4 MM MISC Use 3x a day 300 each 3   levalbuterol (XOPENEX) 1.25 MG/3ML nebulizer solution USE ONE VIAL BY NEBULIZATION EVERY 6 HOURS AS NEEDED FOR WHEEZING OR SHORTNESS OF BREATH 675 mL 1   losartan (COZAAR) 100 MG tablet Take 1 tablet (100 mg total) by mouth daily. 90 tablet 1   magnesium 30 MG tablet Take by mouth daily.     Microlet Lancets MISC TEST BLOOD SUGAR TWICE DAILY 200 each 3   montelukast (SINGULAIR) 10 MG tablet Take 1 tablet (10 mg total) by mouth at bedtime. 90 tablet 1   NONFORMULARY OR COMPOUNDED ITEM Cmp, lipid   Dx hyperlipidemia 1 each 0   NONFORMULARY OR COMPOUNDED ITEM Cmp, lipid--- dx htn and hyperlipidemia Microalbumin---  dx DM II 1 each 0   NONFORMULARY OR COMPOUNDED ITEM Hep C antibody--- need for hep c screen 1 each 0   NONFORMULARY OR COMPOUNDED ITEM Cbcd , cmp, lipid----- dx hyperlipidemia, htn, dm II 1 each 0   Omega-3 Fatty Acids (FISH OIL) 1000 MG CAPS Take by mouth.     omeprazole (PRILOSEC) 40 MG capsule  TAKE 1 CAPSULE(40 MG) BY MOUTH DAILY 90 capsule 3   OZEMPIC, 1 MG/DOSE, 4 MG/3ML SOPN INJECT 1MG  INTO THE SKIN ONCE A WEEK 9 mL 3   promethazine-dextromethorphan (PROMETHAZINE-DM) 6.25-15 MG/5ML syrup Take 5 mLs by mouth 4 (four) times daily as needed. 118 mL 0   vitamin C (ASCORBIC ACID) 500 MG tablet Take 500 mg by mouth daily.     No current facility-administered medications on file prior to visit.   Allergies  Allergen Reactions   Jardiance [Empagliflozin] Other (See Comments)    Persistent yeast infection    Metformin And Related Diarrhea   Pravastatin Other (See Comments)    Increased blood glucose?    Family History  Problem Relation Age of Onset   Diabetes Mother    Hyperlipidemia Mother    Hypertension Mother    Diabetes Father    Hyperlipidemia Father    Hypertension Father     PE: BP 120/84 (BP Location: Right Arm, Patient Position: Sitting, Cuff Size: Normal)   Pulse 83   Ht 5\' 3"  (1.6 m)   Wt 157 lb 6.4 oz (71.4 kg)   SpO2 97%   BMI 27.88 kg/m  Wt Readings from Last 3 Encounters:  09/06/22 157 lb 6.4 oz (71.4 kg)  06/13/22 157 lb 6.4 oz (71.4 kg)  04/26/22 157 lb 3.2 oz (71.3 kg)   Constitutional: normal weight but truncal adipose disposition, in NAD Eyes: EOMI, no exophthalmos ENT: no thyromegaly, no cervical lymphadenopathy Cardiovascular: RRR, No MRG Respiratory: CTA B Musculoskeletal: no deformities Skin: no rashes Neurological: no tremor with outstretched hands  ASSESSMENT: 1. DM2, insulin-dependent, uncontrolled, without long-term complications, but with hyperglycemia  Component     Latest Ref Rng 02/09/2022  Glutamic Acid Decarb Ab     0.0 -  5.0 U/mL <5.0   ZNT8 Antibodies     U/mL <15   Glucose, Plasma     70 - 99 mg/dL 409 (H)   C-Peptide     1.1 - 4.4 ng/mL 4.2    No evidence of pancreatic autoimmunity or insulin deficiency.  2. HL  PLAN:  1. Patient with longstanding, uncontrolled, type 2 diabetes, on GLP-1 receptor agonist and  now OmniPod 5 insulin pump, previously on a basal/bolus insulin regimen.  Her pump integrated with the Dexcom G6 CGM in the automatic mode.  She has Lyumjev in the pump.  She could not tolerate metformin due to nausea and SGLT2 inhibitors due to yeast infections.  She is on Ozempic, which she tolerates well.  At last visit, HbA1c was much better, at 6.4%.  At that time, sugars were higher in the previous 2 weeks most likely due to gastroenteritis and URI.  I advised him to enter more carbs into the pump for the meals especially when sick, less active, or eating larger meals.  She is not comfortable counting carbs so she uses a 1: 1 ICR.  We also increased her basal rates since sugars are staying slightly higher than target throughout the day and night.  I introduced the pump settings into the pump for her. CGM interpretation: -At today's visit, we reviewed her CGM downloads: It appears that 75% of values are in target range (goal >70%), while 25% are higher than 180 (goal <25%), and 0% are lower than 70 (goal <4%).  The calculated average blood sugar is 163.  The projected HbA1c for the next 3 months (GMI) is 7.2%. -Reviewing the CGM trends, sugars appear to be fluctuating in the upper half of the target range overnight and mostly throughout the day, except for the blood sugars after breakfast and dinner, which are higher.  She is now mostly using 11 to 12 g of carbs into the pump for each meal and we discussed that she would need to increase these to allow the pump to give her more insulin for meals and therefore to adapt the pump settings to the higher daily dose.  This will allow her to get more insulin overnight in between meals, also.  Will continue Ozempic for now.  I did advise her to inject this in thighs as she feels that her abdomen and is protuberant and hard. -I gave her a written prescription for the OmniPod so that she can replace the pods every 2 days - I suggested to:  Patient Instructions   Please continue: - Ozempic 1 mg weekly  Please use the following pump settings: - Basal rates: 12 am: 1.35 - Insulin to carb ratio: 12 am: 1:1 (enter 8-12 g carbs) - Target: 12 am: 110-120 - Correction factor (insulin sensitivity factor):  12 am: 28 - Active insulin time: 4h - Changes infusion site: q2 days  Please come back for a follow-up appointment in 3-4 months.  - we checked her HbA1c: 6.7% (higher) - advised to check sugars at different times of the day - 4x a day, rotating check times - advised for yearly eye exams >> she is UTD - return to clinic in 4 months  2. HL -Reviewed latest lipid panel from 10/2021: All fractions at goal: Lab Results  Component Value Date   CHOL 136 10/27/2021   HDL 48 10/27/2021   LDLCALC 69 10/27/2021   TRIG 102 10/27/2021   CHOLHDL 3.1 11/19/2020  -She continues Lipitor 10 mg daily, fenofibrate  160 mg daily, without side effects.  She is also on fish oil 1000 mg twice a day, but for dry eyes.  LABS NEED TO GO TO [facility] -patient's husband works there.  Carlus Pavlov, MD PhD [facility] Endocrinology

## 2022-09-12 ENCOUNTER — Encounter: Payer: Self-pay | Admitting: Internal Medicine

## 2022-10-05 ENCOUNTER — Encounter: Payer: Self-pay | Admitting: Internal Medicine

## 2022-11-16 ENCOUNTER — Ambulatory Visit: Payer: 59 | Admitting: Podiatry

## 2022-11-16 ENCOUNTER — Ambulatory Visit (INDEPENDENT_AMBULATORY_CARE_PROVIDER_SITE_OTHER): Payer: 59

## 2022-11-16 DIAGNOSIS — E1165 Type 2 diabetes mellitus with hyperglycemia: Secondary | ICD-10-CM | POA: Diagnosis not present

## 2022-11-16 DIAGNOSIS — M960 Pseudarthrosis after fusion or arthrodesis: Secondary | ICD-10-CM

## 2022-11-16 DIAGNOSIS — M79672 Pain in left foot: Secondary | ICD-10-CM | POA: Diagnosis not present

## 2022-11-16 NOTE — Progress Notes (Signed)
Subjective:  Patient ID: Dominique [name], female    DOB: Mar 01, 1961,  MRN: [id number]  Chief Complaint  Patient presents with   Foot Pain    62 y.o. female presents with the above complaint.  Patient presents with continuous pain to the left dorsal midfoot.  Patient is a diabetic.  She states that she had surgery done at [facility] orth[facility] and she still continues to have foot pain.  She wanted to discuss/get a second opinion and options on what to do for the pain.  She has not seen anyone else prior to seeing me beside her original Careers adviser.  Pain scale is 5 out of 10 and aching get much worse.  She has not had any CT scan done mostly radiographs which she has brought with her.   Review of Systems: Negative except as noted in the HPI. Denies N/V/F/Ch.  Past Medical History:  Diagnosis Date   Arthritis    left foot   Asthma    Bell palsy 2015   Diabetes mellitus without complication (HCC)    type 2   Endometriosis    GERD (gastroesophageal reflux disease)    H/O hiatal hernia    Histoplasmosis 2002   history of, [place] raised   Hypertension    Kidney stone    Kidney stones     Current Outpatient Medications:    albuterol (VENTOLIN HFA) 108 (90 Base) MCG/ACT inhaler, INHALE 2 PUFFS INTO THE LUNGS EVERY 6 HOURS AS NEEDED FOR WHEEZING OR SHORTNESS OF BREATH, Disp: 20.1 g, Rfl: 1   ALPRAZolam (XANAX) 1 MG tablet, Take 1 tablet (1 mg total) by mouth 2 (two) times daily as needed for anxiety., Disp: 20 tablet, Rfl: 0   beclomethasone (QVAR REDIHALER) 40 MCG/ACT inhaler, Inhale 2 puffs into the lungs 2 (two) times daily., Disp: 1 each, Rfl: 3   Blood Glucose Monitoring Suppl (CONTOUR NEXT ONE) KIT, 1 kit by Does not apply route See admin instructions. For checking blood sugar 2 times a day, Disp: 1 kit, Rfl: 0   cetirizine (ZYRTEC) 10 MG tablet, Take 1 tablet (10 mg total) by mouth daily., Disp: 30 tablet, Rfl: 11   Cholecalciferol (VITAMIN D3 PO), Take 50 mcg by mouth daily.,  Disp: , Rfl:    Continuous Blood Gluc Sensor (DEXCOM G6 SENSOR) MISC, Use as instructed to check blood sugar. Change every 10 days, Disp: 9 each, Rfl: 3   Continuous Blood Gluc Transmit (DEXCOM G6 TRANSMITTER) MISC, Use as instructed to check blood sugar. Change every 90 days., Disp: 1 each, Rfl: 3   ezetimibe (ZETIA) 10 MG tablet, TAKE 1 TABLET(10 MG) BY MOUTH DAILY, Disp: 90 tablet, Rfl: 1   fenofibrate 160 MG tablet, 1 po qd, Disp: 90 tablet, Rfl: 1   fluticasone (FLONASE) 50 MCG/ACT nasal spray, SHAKE LIQUID AND USE 2 SPRAYS IN EACH NOSTRIL DAILY, Disp: 48 g, Rfl: 3   fluticasone (FLOVENT HFA) 110 MCG/ACT inhaler, Inhale 2 puffs into the lungs 2 (two) times daily., Disp: 12 g, Rfl: 1   glucose blood (CONTOUR NEXT TEST) test strip, USE TO CHECK BLOOD SUGAR TWICE DAILY, Disp: 200 strip, Rfl: 5   Insulin Disposable Pump (OMNIPOD 5 G6 PODS, GEN 5,) MISC, 1 each by Does not apply route every other day., Disp: 60 each, Rfl: 3   Insulin Lispro-aabc (LYUMJEV) 100 UNIT/ML SOLN, Use up to 80 units daily and insulin pump, Disp: 40 mL, Rfl: 5   Insulin Pen Needle (PEN NEEDLES) 33G X 4 MM MISC,  Use 3x a day, Disp: 300 each, Rfl: 3   levalbuterol (XOPENEX) 1.25 MG/3ML nebulizer solution, USE ONE VIAL BY NEBULIZATION EVERY 6 HOURS AS NEEDED FOR WHEEZING OR SHORTNESS OF BREATH, Disp: 675 mL, Rfl: 1   losartan (COZAAR) 100 MG tablet, Take 1 tablet (100 mg total) by mouth daily., Disp: 90 tablet, Rfl: 1   magnesium 30 MG tablet, Take by mouth daily., Disp: , Rfl:    Microlet Lancets MISC, TEST BLOOD SUGAR TWICE DAILY, Disp: 200 each, Rfl: 3   montelukast (SINGULAIR) 10 MG tablet, Take 1 tablet (10 mg total) by mouth at bedtime., Disp: 90 tablet, Rfl: 1   NONFORMULARY OR COMPOUNDED ITEM, Cmp, lipid   Dx hyperlipidemia, Disp: 1 each, Rfl: 0   NONFORMULARY OR COMPOUNDED ITEM, Cmp, lipid--- dx htn and hyperlipidemia Microalbumin---  dx DM II, Disp: 1 each, Rfl: 0   NONFORMULARY OR COMPOUNDED ITEM, Hep C antibody---  need for hep c screen, Disp: 1 each, Rfl: 0   NONFORMULARY OR COMPOUNDED ITEM, Cbcd , cmp, lipid----- dx hyperlipidemia, htn, dm II, Disp: 1 each, Rfl: 0   Omega-3 Fatty Acids (FISH OIL) 1000 MG CAPS, Take by mouth., Disp: , Rfl:    omeprazole (PRILOSEC) 40 MG capsule, TAKE 1 CAPSULE(40 MG) BY MOUTH DAILY, Disp: 90 capsule, Rfl: 3   OZEMPIC, 1 MG/DOSE, 4 MG/3ML SOPN, INJECT 1MG  INTO THE SKIN ONCE A WEEK, Disp: 9 mL, Rfl: 3   promethazine-dextromethorphan (PROMETHAZINE-DM) 6.25-15 MG/5ML syrup, Take 5 mLs by mouth 4 (four) times daily as needed., Disp: 118 mL, Rfl: 0   vitamin C (ASCORBIC ACID) 500 MG tablet, Take 500 mg by mouth daily., Disp: , Rfl:   Social History   Tobacco Use  Smoking Status Never  Smokeless Tobacco Never    Allergies  Allergen Reactions   Jardiance [Empagliflozin] Other (See Comments)    Persistent yeast infection    Metformin And Related Diarrhea   Pravastatin Other (See Comments)    Increased blood glucose?    Objective:  There were no vitals filed for this visit. There is no height or weight on file to calculate BMI. Constitutional Well developed. Well nourished.  Vascular Dorsalis pedis pulses palpable bilaterally. Posterior tibial pulses palpable bilaterally. Capillary refill normal to all digits.  No cyanosis or clubbing noted. Pedal hair growth normal.  Neurologic Normal speech. Oriented to person, place, and time. Epicritic sensation to light touch grossly present bilaterally.  Dermatologic Nails well groomed and normal in appearance. No open wounds. No skin lesions.  Orthopedic: Pain on palpation left dorsal midfoot.  Pain along theCourse of the hardware.  Limited range of motion noted at the first metatarsophalangeal joint I am unable to appreciate nonunion.   Radiographs: 3 views of skeletally mature adult left foot: Multiple hardware is noted.  No hardware failure noted.  Reduction of bunion deformity noted.  Mild possible signs of nonunion  noted at the second tarsometatarsal. Assessment:   1. 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  Left first versus second tarsometatarsal joint nonunion -All questions and concerns were discussed with the patient in extensive detail -I am unable to appreciate very strongly regarding nonunion however given the continuous pain without any regression over the years I believe patient will benefit from a CT scan to assess for nonunion.  Patient is in agreement CT scan was  ordered -I discussed shoe gear modification.  Patient has failed all conservative care including injection immobilization and is here for second opinion. -She is a diabetic with last A1c of 6.7  No follow-ups on file.   Left first TMTJ vs 2nd non union CT scan

## 2022-11-25 ENCOUNTER — Encounter: Payer: Self-pay | Admitting: Podiatry

## 2022-11-25 ENCOUNTER — Other Ambulatory Visit: Payer: Self-pay | Admitting: Podiatry

## 2022-11-25 MED ORDER — ALPRAZOLAM 1 MG PO TABS: 1.0000 mg | ORAL_TABLET | Freq: Two times a day (BID) | ORAL | 0 refills | Status: DC | PRN

## 2022-11-29 NOTE — Addendum Note (Signed)
Addended by: Nicholes Rough on: 11/29/2022 08:16 AM   Modules accepted: Level of Service

## 2022-12-01 ENCOUNTER — Other Ambulatory Visit: Payer: Self-pay | Admitting: Internal Medicine

## 2022-12-08 ENCOUNTER — Ambulatory Visit
Admission: RE | Admit: 2022-12-08 | Discharge: 2022-12-08 | Disposition: A | Discharge status: Home / Self Care | Payer: 59 | Source: Ambulatory Visit | Attending: Podiatry | Admitting: Podiatry

## 2022-12-08 DIAGNOSIS — M960 Pseudarthrosis after fusion or arthrodesis: Secondary | ICD-10-CM

## 2022-12-08 DIAGNOSIS — E1165 Type 2 diabetes mellitus with hyperglycemia: Secondary | ICD-10-CM

## 2022-12-14 ENCOUNTER — Ambulatory Visit: Payer: 59 | Admitting: Podiatry

## 2022-12-14 DIAGNOSIS — M25372 Other instability, left ankle: Secondary | ICD-10-CM | POA: Diagnosis not present

## 2022-12-14 DIAGNOSIS — M960 Pseudarthrosis after fusion or arthrodesis: Secondary | ICD-10-CM | POA: Diagnosis not present

## 2022-12-14 DIAGNOSIS — E1165 Type 2 diabetes mellitus with hyperglycemia: Secondary | ICD-10-CM | POA: Diagnosis not present

## 2022-12-14 NOTE — Progress Notes (Signed)
Subjective:  Patient ID: Dominique [name], female    DOB: [birth year],  MRN: [id number]  Chief Complaint  Patient presents with   Diabetes    Pt stated that her foot pain is about the same     62 y.o. female presents with the above complaint.  Patient presents with continuous pain to the left dorsal midfoot.  Patient is a diabetic.  She states that she had surgery done at [facility] orth[facility] and she still continues to have foot pain.  She wanted to discuss/get a second opinion and options on what to do for the pain.  She has not seen anyone else prior to seeing me beside her original Careers adviser.  Pain scale is 5 out of 10 and aching get much worse.  She has not had any CT scan done mostly radiographs which she has brought with her.   Review of Systems: Negative except as noted in the HPI. Denies N/V/F/Ch.  Past Medical History:  Diagnosis Date   Arthritis    left foot   Asthma    Bell palsy 2015   Diabetes mellitus without complication (HCC)    type 2   Endometriosis    GERD (gastroesophageal reflux disease)    H/O hiatal hernia    Histoplasmosis 2002   history of, [place] raised   Hypertension    Kidney stone    Kidney stones     Current Outpatient Medications:    albuterol (VENTOLIN HFA) 108 (90 Base) MCG/ACT inhaler, INHALE 2 PUFFS INTO THE LUNGS EVERY 6 HOURS AS NEEDED FOR WHEEZING OR SHORTNESS OF BREATH, Disp: 20.1 g, Rfl: 1   ALPRAZolam (XANAX) 1 MG tablet, Take 1 tablet (1 mg total) by mouth 2 (two) times daily as needed for anxiety., Disp: 20 tablet, Rfl: 0   beclomethasone (QVAR REDIHALER) 40 MCG/ACT inhaler, Inhale 2 puffs into the lungs 2 (two) times daily., Disp: 1 each, Rfl: 3   Blood Glucose Monitoring Suppl (CONTOUR NEXT ONE) KIT, 1 kit by Does not apply route See admin instructions. For checking blood sugar 2 times a day, Disp: 1 kit, Rfl: 0   cetirizine (ZYRTEC) 10 MG tablet, Take 1 tablet (10 mg total) by mouth daily., Disp: 30 tablet, Rfl: 11    Cholecalciferol (VITAMIN D3 PO), Take 50 mcg by mouth daily., Disp: , Rfl:    Continuous Blood Gluc Sensor (DEXCOM G6 SENSOR) MISC, Use as instructed to check blood sugar. Change every 10 days, Disp: 9 each, Rfl: 3   Continuous Blood Gluc Transmit (DEXCOM G6 TRANSMITTER) MISC, Use as instructed to check blood sugar. Change every 90 days., Disp: 1 each, Rfl: 3   ezetimibe (ZETIA) 10 MG tablet, TAKE 1 TABLET(10 MG) BY MOUTH DAILY, Disp: 90 tablet, Rfl: 1   fenofibrate 160 MG tablet, 1 po qd, Disp: 90 tablet, Rfl: 1   fluticasone (FLONASE) 50 MCG/ACT nasal spray, SHAKE LIQUID AND USE 2 SPRAYS IN EACH NOSTRIL DAILY, Disp: 48 g, Rfl: 3   fluticasone (FLOVENT HFA) 110 MCG/ACT inhaler, Inhale 2 puffs into the lungs 2 (two) times daily., Disp: 12 g, Rfl: 1   glucose blood (CONTOUR NEXT TEST) test strip, USE TO CHECK BLOOD SUGAR TWICE DAILY, Disp: 200 strip, Rfl: 5   Insulin Disposable Pump (OMNIPOD 5 G6 PODS, GEN 5,) MISC, 1 each by Does not apply route every other day., Disp: 60 each, Rfl: 3   Insulin Lispro-aabc (LYUMJEV) 100 UNIT/ML SOLN, Use up to 80 units daily and insulin pump, Disp: 40 mL, Rfl:  5   Insulin Pen Needle (BD PEN NEEDLE NANO 2ND GEN) 32G X 4 MM MISC, USE THREE TIMES DAILY AS DIRECTED, Disp: 300 each, Rfl: 1   levalbuterol (XOPENEX) 1.25 MG/3ML nebulizer solution, USE ONE VIAL BY NEBULIZATION EVERY 6 HOURS AS NEEDED FOR WHEEZING OR SHORTNESS OF BREATH, Disp: 675 mL, Rfl: 1   losartan (COZAAR) 100 MG tablet, Take 1 tablet (100 mg total) by mouth daily., Disp: 90 tablet, Rfl: 1   magnesium 30 MG tablet, Take by mouth daily., Disp: , Rfl:    Microlet Lancets MISC, TEST BLOOD SUGAR TWICE DAILY, Disp: 200 each, Rfl: 3   montelukast (SINGULAIR) 10 MG tablet, Take 1 tablet (10 mg total) by mouth at bedtime., Disp: 90 tablet, Rfl: 1   NONFORMULARY OR COMPOUNDED ITEM, Cmp, lipid   Dx hyperlipidemia, Disp: 1 each, Rfl: 0   NONFORMULARY OR COMPOUNDED ITEM, Cmp, lipid--- dx htn and hyperlipidemia  Microalbumin---  dx DM II, Disp: 1 each, Rfl: 0   NONFORMULARY OR COMPOUNDED ITEM, Hep C antibody--- need for hep c screen, Disp: 1 each, Rfl: 0   NONFORMULARY OR COMPOUNDED ITEM, Cbcd , cmp, lipid----- dx hyperlipidemia, htn, dm II, Disp: 1 each, Rfl: 0   Omega-3 Fatty Acids (FISH OIL) 1000 MG CAPS, Take by mouth., Disp: , Rfl:    omeprazole (PRILOSEC) 40 MG capsule, TAKE 1 CAPSULE(40 MG) BY MOUTH DAILY, Disp: 90 capsule, Rfl: 3   OZEMPIC, 1 MG/DOSE, 4 MG/3ML SOPN, INJECT 1MG  INTO THE SKIN ONCE A WEEK, Disp: 9 mL, Rfl: 3   promethazine-dextromethorphan (PROMETHAZINE-DM) 6.25-15 MG/5ML syrup, Take 5 mLs by mouth 4 (four) times daily as needed., Disp: 118 mL, Rfl: 0   vitamin C (ASCORBIC ACID) 500 MG tablet, Take 500 mg by mouth daily., Disp: , Rfl:   Social History   Tobacco Use  Smoking Status Never  Smokeless Tobacco Never    Allergies  Allergen Reactions   Jardiance [Empagliflozin] Other (See Comments)    Persistent yeast infection    Metformin And Related Diarrhea   Pravastatin Other (See Comments)    Increased blood glucose?    Objective:  There were no vitals filed for this visit. There is no height or weight on file to calculate BMI. Constitutional Well developed. Well nourished.  Vascular Dorsalis pedis pulses palpable bilaterally. Posterior tibial pulses palpable bilaterally. Capillary refill normal to all digits.  No cyanosis or clubbing noted. Pedal hair growth normal.  Neurologic Normal speech. Oriented to person, place, and time. Epicritic sensation to light touch grossly present bilaterally.  Dermatologic Nails well groomed and normal in appearance. No open wounds. No skin lesions.  Orthopedic: Pain on palpation left dorsal midfoot.  Pain along theCourse of the hardware.  Limited range of motion noted at the first metatarsophalangeal joint I am unable to appreciate nonunion. Left ankle instability with pain at the ATFL ligament weakened ATFL ligament clinically  appreciated.  Pain with plantarflexion inversion of the foot.  No pain with dorsiflexion eversion of the foot   Radiographs: 3 views of skeletally mature adult left foot: Multiple hardware is noted.  No hardware failure noted.  Reduction of bunion deformity noted.  Mild possible signs of nonunion noted at the second tarsometatarsal. 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 Type 2 diabetes mellitus with hyperglycemia, without long-term current use of insulin (HCC)   3. Ankle instability, left     Plan:  Patient was evaluated and treated and all questions answered.  Left first versus  second tarsometatarsal joint nonunion/hardware pain -All questions and concerns were discussed with the patient in extensive detail -CT scan was reviewed with the patient which shows that patient has mostly consolidated first and tarsometatarsal fusion.  Patient is having some arthritis of the third tarsometatarsal joint. -I discussed shoe gear modification.  Patient has failed all conservative care including injection immobilization and is here for second opinion. -She is a diabetic with last A1c of 6.7  Left ankle instability -I explained the patient the etiology of instability and very treatment options were discussed.  Given the amount of instability that is present this could likely be putting a lot of stress in the midfoot.  I believe she benefit from an ankle brace ankle brace was dispensed -If there is an improvement with an ankle brace patient will benefit from an AFO/medial bracing.

## 2022-12-19 LAB — LAB REPORT - SCANNED: A1c: 6.6

## 2023-01-01 ENCOUNTER — Other Ambulatory Visit: Payer: Self-pay | Admitting: Internal Medicine

## 2023-01-11 ENCOUNTER — Encounter: Payer: Self-pay | Admitting: Internal Medicine

## 2023-01-11 ENCOUNTER — Ambulatory Visit: Payer: 59 | Admitting: Internal Medicine

## 2023-01-11 VITALS — BP 120/70 | HR 96 | Ht 63.0 in | Wt 160.6 lb

## 2023-01-11 DIAGNOSIS — Z7985 Long-term (current) use of injectable non-insulin antidiabetic drugs: Secondary | ICD-10-CM | POA: Diagnosis not present

## 2023-01-11 DIAGNOSIS — Z794 Long term (current) use of insulin: Secondary | ICD-10-CM | POA: Diagnosis not present

## 2023-01-11 DIAGNOSIS — E1165 Type 2 diabetes mellitus with hyperglycemia: Secondary | ICD-10-CM | POA: Diagnosis not present

## 2023-01-11 LAB — HEMOGLOBIN A1C: Hemoglobin A1C: 6.6

## 2023-01-11 MED ORDER — BD PEN NEEDLE NANO 2ND GEN 32G X 4 MM MISC: 1.0000 | Freq: Three times a day (TID) | 1 refills | Status: AC

## 2023-01-11 MED ORDER — LYUMJEV 100 UNIT/ML IJ SOLN: INTRAMUSCULAR | 3 refills | Status: DC

## 2023-01-11 MED ORDER — LANTUS SOLOSTAR 100 UNIT/ML ~~LOC~~ SOPN: 30.0000 [IU] | PEN_INJECTOR | Freq: Every day | SUBCUTANEOUS | 1 refills | Status: DC

## 2023-01-11 MED ORDER — CONTOUR NEXT ONE KIT: 1.0000 | PACK | 0 refills | Status: AC

## 2023-01-11 MED ORDER — LYUMJEV KWIKPEN 200 UNIT/ML ~~LOC~~ SOPN: PEN_INJECTOR | SUBCUTANEOUS | 1 refills | Status: DC

## 2023-01-11 NOTE — Progress Notes (Addendum)
Patient ID: Willaim [name], female   DOB: [birth year], 62 y.o.   MRN: [id number]   HPI: Dominique [name] is a 62 y.o.-year-old female, returning for follow-up for DM2, dx in 2016, but GDM in 2000, insulin-dependent, uncontrolled, without long term complications.  She previously saw Dr. Everardo All.  Last visit with me 4 months ago.  Interim history: No increased urination, blurry vision, nausea, chest pain. She does complain of continuous weight gain.  Reviewed HbA1c levels: Lab Results  Component Value Date   HGBA1C 6.7 (A) 09/06/2022   HGBA1C 6.4 (A) 06/13/2022   HGBA1C 7.1 (A) 02/07/2022   HGBA1C 7.1 (A) 10/20/2021   HGBA1C 7.1 (A) 06/17/2021   HGBA1C 6.6 (A) 02/12/2021   HGBA1C 6.7 (A) 10/07/2020   HGBA1C 6.7 (A) 06/04/2020   HGBA1C 6.0 (H) 11/25/2019   HGBA1C 6.3 (A) 06/19/2019  HbA1c 8.9%  At last visit she was on: -  >> stopped 10/2021 - Ozempic 0.5 >> 1 mg weekly  (had nausea with 2 mg dose) - Lantus 10 units in HS - added 06/2021 >> 34 >> 40 >> 48 units in HS - Lyumjev 6-10 units before B and D - added 10/2021 We had to stop Jardiance 07/2018 due to persistent yeast infections. She stopped metformin IR due to lack of effect.  We had to stop metformin ER due to nausea. She developed a rash with Trulicity She was previously on nateglinide and also on repaglinide.  Currently on OmniPod 5 CGM (with Lyumjev): - Basal rates: 12 am: 1.25 >> 1.35 units/h - Insulin to carb ratio: 12 am: 1:1 (enter 8-12 g carbs) - Target: 12 am: 110-120 - Correction factor (insulin sensitivity factor):  12 am: 28 - Active insulin time: 4h - Changes infusion site: q2 days  Total daily dose from basal insulin: 43.2 units (67%) >> 57% (43 units) >> 54% (46.6 units) Total daily dose from bolus insulin: 21.4 units (33%) >> 43% (33 units) >> 46% (40.2 units) Total daily dose 90-120 units daily  Pt checks her sugars  >4x a day with her Dexcom G6  CGM:    Previously:  Previously:    Lowest sugar was 82 >> 90 >> 80s; she has hypoglycemia awareness in the 90s. Highest sugar was 320 >> 300 >> 200s.  Glucometer: Accu-Chek guide  Pt's meals are: - Breakfast: 1-2 egg, 1-2 toast; egg + bacon - Lunch: eating out, salad, meat + veggies - Dinner: eating out, salad, meat + veggies - Dessert: apple pie, cake - Snacks: chips  -No CKD, last BUN/creatinine:  Lab Results  Component Value Date   BUN 12 03/10/2022   BUN 13 10/27/2021   CREATININE 0.84 03/10/2022   CREATININE 0.80 10/27/2021   Lab Results  Component Value Date   MICRALBCREAT 10 02/14/2019  On losartan 100.  -+ HL; last set of lipids: Lab Results  Component Value Date   CHOL 136 10/27/2021   HDL 48 10/27/2021   LDLCALC 69 10/27/2021   TRIG 102 10/27/2021   CHOLHDL 3.1 11/19/2020  On Lipitor 10, fenofibrate 160, fish oil 1000 mg 2x a day.  - last eye exam was in 08/2022: No DR reportedly; Dr. Hyacinth Meeker. Sees an ophthalmic surgeon >> contemplates palpebral surgery. On Xiidra and fish oil for dry eyes.  -No numbness and tingling in her feet.  She saw neurology -no peripheral neuropathy.  Last foot exam 12/17/2022 by Dr. Allena Katz with podiatry.  Off Gabapentin - prev. For Bell's palsy. She has a history  of a kidney stone in 2019.  Pt has FH of DM in mother and father.  ROS: + see HPI Neurological: no tremors/+ numbness - top of L foot/no tingling/no dizziness  I reviewed pt's medications, allergies, PMH, social hx, family hx, and changes were documented in the history of present illness. Otherwise, unchanged from my initial visit note.  Past Medical History:  Diagnosis Date   Arthritis    left foot   Asthma    Bell palsy 2015   Diabetes mellitus without complication (HCC)    type 2   Endometriosis    GERD (gastroesophageal reflux disease)    H/O hiatal hernia    Histoplasmosis 2002   history of, [place] raised   Hypertension    Kidney stone    Kidney  stones    Past Surgical History:  Procedure Laterality Date   FOOT ARTHRODESIS Left 09/05/2019   Procedure: Left Modified McBride, Left First and Second Tarsometatarsal Joint Arthrodesis;  Surgeon: Toni Arthurs, MD;  Location: [facility];  Service: Orthopedics;  Laterality: Left;   laparatomy  1985   RADIAL HEAD ARTHROPLASTY Left 02/23/2013   Procedure: LEFT RADIAL HEAD ARTHROPLASTY qwith ligament reconstruction;  Surgeon: Dominica Severin, MD;  Location: [facility] OR;  Service: Orthopedics;  Laterality: Left;   TONSILLECTOMY AND ADENOIDECTOMY  1967   uterine lining ablation  2002   Social History   Socioeconomic History   Marital status: Married    Spouse name: Not on file   Number of children: Not on file   Years of education: Not on file   Highest education level: Not on file  Occupational History    Comment: unemployed  Tobacco Use   Smoking status: Never   Smokeless tobacco: Never  Substance and Sexual Activity   Alcohol use: No   Drug use: No   Sexual activity: Yes    Partners: Male  Other Topics Concern   Not on file  Social History Narrative   Exercise-- no   Social Determinants of Health   Financial Resource Strain: Not on file  Food Insecurity: Not on file  Transportation Needs: Not on file  Physical Activity: Not on file  Stress: Not on file  Social Connections: Not on file  Intimate Partner Violence: Not on file   Current Outpatient Medications on File Prior to Visit  Medication Sig Dispense Refill   albuterol (VENTOLIN HFA) 108 (90 Base) MCG/ACT inhaler INHALE 2 PUFFS INTO THE LUNGS EVERY 6 HOURS AS NEEDED FOR WHEEZING OR SHORTNESS OF BREATH 20.1 g 1   ALPRAZolam (XANAX) 1 MG tablet Take 1 tablet (1 mg total) by mouth 2 (two) times daily as needed for anxiety. 20 tablet 0   beclomethasone (QVAR REDIHALER) 40 MCG/ACT inhaler Inhale 2 puffs into the lungs 2 (two) times daily. 1 each 3   Blood Glucose Monitoring Suppl (CONTOUR NEXT ONE) KIT 1 kit by  Does not apply route See admin instructions. For checking blood sugar 2 times a day 1 kit 0   cetirizine (ZYRTEC) 10 MG tablet Take 1 tablet (10 mg total) by mouth daily. 30 tablet 11   Cholecalciferol (VITAMIN D3 PO) Take 50 mcg by mouth daily.     Continuous Blood Gluc Sensor (DEXCOM G6 SENSOR) MISC Use as instructed to check blood sugar. Change every 10 days 9 each 3   Continuous Blood Gluc Transmit (DEXCOM G6 TRANSMITTER) MISC Use as instructed to check blood sugar. Change every 90 days. 1 each 3   ezetimibe (  ZETIA) 10 MG tablet TAKE 1 TABLET(10 MG) BY MOUTH DAILY 90 tablet 1   fenofibrate 160 MG tablet 1 po qd 90 tablet 1   fluticasone (FLONASE) 50 MCG/ACT nasal spray SHAKE LIQUID AND USE 2 SPRAYS IN EACH NOSTRIL DAILY 48 g 3   fluticasone (FLOVENT HFA) 110 MCG/ACT inhaler Inhale 2 puffs into the lungs 2 (two) times daily. 12 g 1   glucose blood (CONTOUR NEXT TEST) test strip USE TO CHECK BLOOD SUGAR TWICE DAILY 200 strip 5   Insulin Disposable Pump (OMNIPOD 5 G6 PODS, GEN 5,) MISC 1 each by Does not apply route every other day. 60 each 3   Insulin Lispro-aabc (LYUMJEV) 100 UNIT/ML SOLN Use up to 80 units daily and insulin pump 40 mL 5   Insulin Pen Needle (BD PEN NEEDLE NANO 2ND GEN) 32G X 4 MM MISC USE THREE TIMES DAILY AS DIRECTED 300 each 1   levalbuterol (XOPENEX) 1.25 MG/3ML nebulizer solution USE ONE VIAL BY NEBULIZATION EVERY 6 HOURS AS NEEDED FOR WHEEZING OR SHORTNESS OF BREATH 675 mL 1   losartan (COZAAR) 100 MG tablet Take 1 tablet (100 mg total) by mouth daily. 90 tablet 1   magnesium 30 MG tablet Take by mouth daily.     Microlet Lancets MISC TEST BLOOD SUGAR TWICE DAILY 200 each 3   montelukast (SINGULAIR) 10 MG tablet Take 1 tablet (10 mg total) by mouth at bedtime. 90 tablet 1   NONFORMULARY OR COMPOUNDED ITEM Cmp, lipid   Dx hyperlipidemia 1 each 0   NONFORMULARY OR COMPOUNDED ITEM Cmp, lipid--- dx htn and hyperlipidemia Microalbumin---  dx DM II 1 each 0   NONFORMULARY OR  COMPOUNDED ITEM Hep C antibody--- need for hep c screen 1 each 0   NONFORMULARY OR COMPOUNDED ITEM Cbcd , cmp, lipid----- dx hyperlipidemia, htn, dm II 1 each 0   Omega-3 Fatty Acids (FISH OIL) 1000 MG CAPS Take by mouth.     omeprazole (PRILOSEC) 40 MG capsule TAKE 1 CAPSULE(40 MG) BY MOUTH DAILY 90 capsule 3   promethazine-dextromethorphan (PROMETHAZINE-DM) 6.25-15 MG/5ML syrup Take 5 mLs by mouth 4 (four) times daily as needed. 118 mL 0   Semaglutide, 1 MG/DOSE, (OZEMPIC, 1 MG/DOSE,) 4 MG/3ML SOPN INJECT 1 MG UNDER THE SKIN ONCE A WEEK 9 mL 0   vitamin C (ASCORBIC ACID) 500 MG tablet Take 500 mg by mouth daily.     No current facility-administered medications on file prior to visit.   Allergies  Allergen Reactions   Jardiance [Empagliflozin] Other (See Comments)    Persistent yeast infection    Metformin And Related Diarrhea   Pravastatin Other (See Comments)    Increased blood glucose?    Family History  Problem Relation Age of Onset   Diabetes Mother    Hyperlipidemia Mother    Hypertension Mother    Diabetes Father    Hyperlipidemia Father    Hypertension Father    PE: BP 120/70   Pulse 96   Ht 5\' 3"  (1.6 m)   Wt 160 lb 9.6 oz (72.8 kg)   SpO2 96%   BMI 28.45 kg/m  Wt Readings from Last 3 Encounters:  01/11/23 160 lb 9.6 oz (72.8 kg)  09/06/22 157 lb 6.4 oz (71.4 kg)  06/13/22 157 lb 6.4 oz (71.4 kg)   Constitutional: normal weight but truncal adipose disposition, in NAD Eyes: EOMI, no exophthalmos ENT: no thyromegaly, no cervical lymphadenopathy Cardiovascular: tachycardia, RR, No MRG Respiratory: CTA B Musculoskeletal: no deformities Skin:  no rashes Neurological: no tremor with outstretched hands  ASSESSMENT: 1. DM2, insulin-dependent, uncontrolled, without long-term complications, but with hyperglycemia  Component     Latest Ref Rng 02/09/2022  Glutamic Acid Decarb Ab     0.0 - 5.0 U/mL <5.0   ZNT8 Antibodies     U/mL <15   Glucose, Plasma     70 -  99 mg/dL 433 (H)   C-Peptide     1.1 - 4.4 ng/mL 4.2    No evidence of pancreatic autoimmunity or insulin deficiency.  2. HL  PLAN:  1. Patient with longstanding uncontrolled type 2 diabetes, on GLP-1 receptor agonist and now on an OmniPod 5 insulin pump, previously on a basal/bolus insulin regimen.  Her pump integrated with the Dexcom G6 CGM.  She has Lyumjev in the pump.  She could not tolerate metformin due to nausea and SGLT2 inhibitors due to yeast infections.  She tolerates Ozempic well.  Latest HbA1c was higher at 6.7% at last visit.  At last visit, sugars appears to be fluctuating in the upper half of the target range overnight and mostly throughout the day, except for higher values after breakfast and dinner.  She was now mostly using 11 to 12 g of carbs for the meals and I advised her to increase these and allow the pump to give her more insulin for meals.  Of note, she is not comfortable counting carbs so she uses a 1: 1 insulin to carb ratio.  We discussed about injecting Ozempic in thighs as she felt that her abdomen was protuberant and hard to palpation.  We also changed her prescription for OmniPod so she could replace the pods every 2 days. -She is not comfortable making changes into her pump so I usually introduce changes into the pump for her. CGM interpretation: -At today's visit, we reviewed her CGM downloads: It appears that 72% of values are in target range (goal >70%), while 28% are higher than 180 (goal <25%), and 0% are lower than 70 (goal <4%).  The calculated average blood sugar is 169.  The projected HbA1c for the next 3 months (GMI) is 7.4%. -Reviewing the CGM trends, it appears that sugars have improved lately, but they are still increasing almost consistently after breakfast and every 50% of the time after dinner.  Also, in the rest of the day, sugars are fluctuating within the more narrow range in the upper half of the target range.  We discussed about trying to enter more  carbs into the pump since this would allow the algorithm to understand that she needs more insulin throughout the day and also after meals. -She is preparing to go to [employer] and inquires about the backup regimen so we discussed about using Lantus starting at 30 units daily and increasing as needed and Lyumjev, the same amount that she enters in the pump. -She also would like a new glucometer, which was sent to her pharmacy. -I also refilled her Lyumjev while prescriptions - I suggested to:  Patient Instructions  Please continue: - Ozempic 1 mg weekly  Please use the following pump settings: - Basal rates: 12 am: 1.35 - Insulin to carb ratio: 12 am: 1:1 (enter 10-16 g carbs) - Target: 12 am: 110-120 - Correction factor (insulin sensitivity factor):  12 am: 28 - Active insulin time: 4h - Changes infusion site: q2 days  Backup regimen: - Lantus 30 units daily (increase as needed) - Lyumjev 10-16 units 3x a day before meals  Please come back for a follow-up appointment in 3-4 months.  - we checked her HbA1c: 6.6% (lower) - advised to check sugars at different times of the day - 4x a day, rotating check times - advised for yearly eye exams >> she is UTD - return to clinic in 3-4 months  2. HL -Latest lipid panel from 10/2021: Fractions at goal: Lab Results  Component Value Date   CHOL 136 10/27/2021   HDL 48 10/27/2021   LDLCALC 69 10/27/2021   TRIG 102 10/27/2021   CHOLHDL 3.1 11/19/2020  -She is on Lipitor 10 mg daily, fenofibrate 160 mg daily-without side effects.  She is taking fish oil 1000 mg twice a day for dry eyes. -She needs a new lipid panel -she needs an annual physical exam with PCP.  If she does not have this before next visit, we will check it then.  LABS NEED TO GO TO [facility] -patient's husband works there.  Carlus Pavlov, MD PhD [facility]

## 2023-01-11 NOTE — Patient Instructions (Addendum)
Please continue: - Ozempic 1 mg weekly  Please use the following pump settings: - Basal rates: 12 am: 1.35 - Insulin to carb ratio: 12 am: 1:1 (enter 10-16 g carbs) - Target: 12 am: 110-120 - Correction factor (insulin sensitivity factor):  12 am: 28 - Active insulin time: 4h - Changes infusion site: q2 days  Backup regimen: - Lantus 30 units daily (increase as needed) - Lyumjev 10-16 units 3x a day before meals  Please come back for a follow-up appointment in 3-4 months.

## 2023-01-12 ENCOUNTER — Telehealth: Payer: Self-pay | Admitting: Dietician

## 2023-01-12 NOTE — Telephone Encounter (Signed)
Returned patient call.  She has questions regarding her Omnipod insulin pump.  She states that she has gotten a message "value expired".  Discussed that she should call the Omnipod 24/7 number to determine what to do about this message.  Oran Rein, RD, LDN, CDCES

## 2023-01-18 ENCOUNTER — Ambulatory Visit: Payer: 59 | Admitting: Podiatry

## 2023-01-18 ENCOUNTER — Telehealth: Payer: Self-pay | Admitting: Family Medicine

## 2023-01-18 DIAGNOSIS — M960 Pseudarthrosis after fusion or arthrodesis: Secondary | ICD-10-CM

## 2023-01-18 DIAGNOSIS — E1165 Type 2 diabetes mellitus with hyperglycemia: Secondary | ICD-10-CM | POA: Diagnosis not present

## 2023-01-18 DIAGNOSIS — E785 Hyperlipidemia, unspecified: Secondary | ICD-10-CM

## 2023-01-18 MED ORDER — FENOFIBRATE 160 MG PO TABS
ORAL_TABLET | ORAL | 0 refills | Status: DC
Start: 2023-01-18 — End: 2023-01-20

## 2023-01-18 NOTE — Telephone Encounter (Signed)
Pt called to see why her fenofibrate was denied. Advised pt that Dr. Laury Axon has not seen her since December of last year so she will need an appt for medication refills. Please send to [facility] on [address].

## 2023-01-18 NOTE — Progress Notes (Signed)
Subjective:  Patient ID: Dominique [name], female    DOB: December 21, 1960,  MRN: [id number]  Chief Complaint  Patient presents with   Nonunion after arthrodesis    Pt stated that things are about the same she is wanting to know the results of the CT scan     62 y.o. female presents with the above complaint.  Patient presents with continuous pain to the left dorsal midfoot.  Patient is a diabetic.  She states that she had surgery done at [facility] orthopedics and she still continues to have foot pain.  She wanted to discuss/get a second opinion and options on what to do for the pain.  She has not seen anyone else prior to seeing me beside her original Careers adviser.  Pain scale is 5 out of 10 and aching get much worse.  She has not had any CT scan done mostly radiographs which she has brought with her.   Review of Systems: Negative except as noted in the HPI. Denies N/V/F/Ch.  Past Medical History:  Diagnosis Date   Arthritis    left foot   Asthma    Bell palsy 2015   Diabetes mellitus without complication (HCC)    type 2   Endometriosis    GERD (gastroesophageal reflux disease)    H/O hiatal hernia    Histoplasmosis 2002   history of, [place] raised   Hypertension    Kidney stone    Kidney stones     Current Outpatient Medications:    albuterol (VENTOLIN HFA) 108 (90 Base) MCG/ACT inhaler, INHALE 2 PUFFS INTO THE LUNGS EVERY 6 HOURS AS NEEDED FOR WHEEZING OR SHORTNESS OF BREATH, Disp: 20.1 g, Rfl: 1   ALPRAZolam (XANAX) 1 MG tablet, Take 1 tablet (1 mg total) by mouth 2 (two) times daily as needed for anxiety., Disp: 20 tablet, Rfl: 0   beclomethasone (QVAR REDIHALER) 40 MCG/ACT inhaler, Inhale 2 puffs into the lungs 2 (two) times daily., Disp: 1 each, Rfl: 3   Blood Glucose Monitoring Suppl (CONTOUR NEXT ONE) KIT, 1 kit by Does not apply route See admin instructions. For checking blood sugar 2 times a day, Disp: 1 kit, Rfl: 0   Cholecalciferol (VITAMIN D3 PO), Take 50 mcg by mouth  daily., Disp: , Rfl:    Continuous Blood Gluc Sensor (DEXCOM G6 SENSOR) MISC, Use as instructed to check blood sugar. Change every 10 days, Disp: 9 each, Rfl: 3   Continuous Blood Gluc Transmit (DEXCOM G6 TRANSMITTER) MISC, Use as instructed to check blood sugar. Change every 90 days., Disp: 1 each, Rfl: 3   ezetimibe (ZETIA) 10 MG tablet, TAKE 1 TABLET(10 MG) BY MOUTH DAILY, Disp: 90 tablet, Rfl: 1   fenofibrate 160 MG tablet, 1 po qd, Disp: 90 tablet, Rfl: 1   fluticasone (FLONASE) 50 MCG/ACT nasal spray, SHAKE LIQUID AND USE 2 SPRAYS IN EACH NOSTRIL DAILY, Disp: 48 g, Rfl: 3   fluticasone (FLOVENT HFA) 110 MCG/ACT inhaler, Inhale 2 puffs into the lungs 2 (two) times daily., Disp: 12 g, Rfl: 1   glucose blood (CONTOUR NEXT TEST) test strip, USE TO CHECK BLOOD SUGAR TWICE DAILY, Disp: 200 strip, Rfl: 5   Insulin Disposable Pump (OMNIPOD 5 G6 PODS, GEN 5,) MISC, 1 each by Does not apply route every other day., Disp: 60 each, Rfl: 3   insulin glargine (LANTUS SOLOSTAR) 100 UNIT/ML Solostar Pen, Inject 30 Units into the skin at bedtime., Disp: 15 mL, Rfl: 1   Insulin Lispro-aabc (LYUMJEV KWIKPEN) 200 UNIT/ML  KwikPen, Inject 10-16 units under skin 3x a day as advised, Disp: 9 mL, Rfl: 1   Insulin Lispro-aabc (LYUMJEV) 100 UNIT/ML SOLN, Use up to 80 units daily and insulin pump, Disp: 90 mL, Rfl: 3   Insulin Pen Needle (BD PEN NEEDLE NANO 2ND GEN) 32G X 4 MM MISC, Inject 1 each into the skin 3 (three) times daily. as directed, Disp: 100 each, Rfl: 1   levalbuterol (XOPENEX) 1.25 MG/3ML nebulizer solution, USE ONE VIAL BY NEBULIZATION EVERY 6 HOURS AS NEEDED FOR WHEEZING OR SHORTNESS OF BREATH, Disp: 675 mL, Rfl: 1   losartan (COZAAR) 100 MG tablet, Take 1 tablet (100 mg total) by mouth daily., Disp: 90 tablet, Rfl: 1   magnesium 30 MG tablet, Take by mouth daily., Disp: , Rfl:    Microlet Lancets MISC, TEST BLOOD SUGAR TWICE DAILY, Disp: 200 each, Rfl: 3   montelukast (SINGULAIR) 10 MG tablet, Take 1  tablet (10 mg total) by mouth at bedtime., Disp: 90 tablet, Rfl: 1   NONFORMULARY OR COMPOUNDED ITEM, Cmp, lipid   Dx hyperlipidemia, Disp: 1 each, Rfl: 0   NONFORMULARY OR COMPOUNDED ITEM, Cmp, lipid--- dx htn and hyperlipidemia Microalbumin---  dx DM II, Disp: 1 each, Rfl: 0   NONFORMULARY OR COMPOUNDED ITEM, Hep C antibody--- need for hep c screen, Disp: 1 each, Rfl: 0   NONFORMULARY OR COMPOUNDED ITEM, Cbcd , cmp, lipid----- dx hyperlipidemia, htn, dm II, Disp: 1 each, Rfl: 0   Omega-3 Fatty Acids (FISH OIL) 1000 MG CAPS, Take by mouth., Disp: , Rfl:    omeprazole (PRILOSEC) 40 MG capsule, TAKE 1 CAPSULE(40 MG) BY MOUTH DAILY, Disp: 90 capsule, Rfl: 3   Semaglutide, 1 MG/DOSE, (OZEMPIC, 1 MG/DOSE,) 4 MG/3ML SOPN, INJECT 1 MG UNDER THE SKIN ONCE A WEEK, Disp: 9 mL, Rfl: 0   vitamin C (ASCORBIC ACID) 500 MG tablet, Take 500 mg by mouth daily., Disp: , Rfl:   Social History   Tobacco Use  Smoking Status Never  Smokeless Tobacco Never    Allergies  Allergen Reactions   Jardiance [Empagliflozin] Other (See Comments)    Persistent yeast infection    Metformin And Related Diarrhea   Pravastatin Other (See Comments)    Increased blood glucose?    Objective:  There were no vitals filed for this visit. There is no height or weight on file to calculate BMI. Constitutional Well developed. Well nourished.  Vascular Dorsalis pedis pulses palpable bilaterally. Posterior tibial pulses palpable bilaterally. Capillary refill normal to all digits.  No cyanosis or clubbing noted. Pedal hair growth normal.  Neurologic Normal speech. Oriented to person, place, and time. Epicritic sensation to light touch grossly present bilaterally.  Dermatologic Nails well groomed and normal in appearance. No open wounds. No skin lesions.  Orthopedic: Pain on palpation left dorsal midfoot.  Pain along theCourse of the hardware.  Limited range of motion noted at the first metatarsophalangeal joint I am unable  to appreciate nonunion. Left ankle instability with pain at the ATFL ligament weakened ATFL ligament clinically appreciated.  Pain with plantarflexion inversion of the foot.  No pain with dorsiflexion eversion of the foot   Radiographs: 3 views of skeletally mature adult left foot: Multiple hardware is noted.  No hardware failure noted.  Reduction of bunion deformity noted.  Mild possible signs of nonunion noted at the second tarsometatarsal. 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 Type 2 diabetes mellitus with hyperglycemia, without long-term current use of insulin (HCC)  Plan:  Patient was evaluated and treated and all questions answered.  Left first versus second tarsometatarsal joint nonunion/hardware pain -All questions and concerns were discussed with the patient in extensive detail -CT scan was reviewed with the patient which shows that patient has mostly consolidated first and tarsometatarsal fusion.  Patient is having some arthritis of the third tarsometatarsal joint. -Patient will think about the surgery and will get back to me when she is ready -I discussed shoe gear modification.  Patient has failed all conservative care including injection immobilization and is here for second opinion. -She is a diabetic with last A1c of 6.7  Left ankle instability -Bracing did give her some stability.  However she was not able to wear her brace with all shoes.  She will continue seeing if the bracing will help.  In the future we can make her more custom bracing if needed use right

## 2023-01-18 NOTE — Telephone Encounter (Signed)
Rx sent for 30 days.

## 2023-01-18 NOTE — Addendum Note (Signed)
Addended by: Roxanne Gates on: 01/18/2023 01:22 PM   Modules accepted: Orders

## 2023-01-20 ENCOUNTER — Encounter: Payer: Self-pay | Admitting: Family Medicine

## 2023-01-20 ENCOUNTER — Ambulatory Visit: Payer: 59 | Admitting: Family Medicine

## 2023-01-20 ENCOUNTER — Other Ambulatory Visit: Payer: Self-pay | Admitting: Family Medicine

## 2023-01-20 VITALS — BP 120/70 | HR 87 | Temp 98.7°F | Resp 18 | Ht 63.0 in | Wt 161.6 lb

## 2023-01-20 DIAGNOSIS — I10 Essential (primary) hypertension: Secondary | ICD-10-CM | POA: Diagnosis not present

## 2023-01-20 DIAGNOSIS — E1165 Type 2 diabetes mellitus with hyperglycemia: Secondary | ICD-10-CM

## 2023-01-20 DIAGNOSIS — E785 Hyperlipidemia, unspecified: Secondary | ICD-10-CM

## 2023-01-20 DIAGNOSIS — K219 Gastro-esophageal reflux disease without esophagitis: Secondary | ICD-10-CM | POA: Diagnosis not present

## 2023-01-20 DIAGNOSIS — R0683 Snoring: Secondary | ICD-10-CM

## 2023-01-20 DIAGNOSIS — Z23 Encounter for immunization: Secondary | ICD-10-CM | POA: Diagnosis not present

## 2023-01-20 DIAGNOSIS — J4520 Mild intermittent asthma, uncomplicated: Secondary | ICD-10-CM

## 2023-01-20 DIAGNOSIS — Z7985 Long-term (current) use of injectable non-insulin antidiabetic drugs: Secondary | ICD-10-CM

## 2023-01-20 DIAGNOSIS — Z794 Long term (current) use of insulin: Secondary | ICD-10-CM

## 2023-01-20 MED ORDER — OMEPRAZOLE 40 MG PO CPDR
DELAYED_RELEASE_CAPSULE | ORAL | 3 refills | Status: DC
Start: 2023-01-20 — End: 2023-10-16

## 2023-01-20 MED ORDER — FENOFIBRATE 160 MG PO TABS: ORAL_TABLET | ORAL | 3 refills | Status: DC

## 2023-01-20 MED ORDER — LOSARTAN POTASSIUM 100 MG PO TABS: 100.0000 mg | ORAL_TABLET | Freq: Every day | ORAL | 3 refills | Status: DC

## 2023-01-20 NOTE — Assessment & Plan Note (Signed)
stable °

## 2023-01-20 NOTE — Progress Notes (Signed)
Established Patient Office Visit  Subjective   Patient ID: Dominique [name], female    DOB: 1960-07-25  Age: 62 y.o. MRN: [id number]  Chief Complaint  Patient presents with   Hypertension   Follow-up    HPI Discussed the use of AI scribe software for clinical note transcription with the patient, who gave verbal consent to proceed.  History of Present Illness   The patient, with a history of diabetes managed with insulin, Ozempic, and a Dexcom continuous glucose monitor, presents with exhaustion and snoring. She reports that her husband has noticed her snoring and that she has woken herself up from it. She also reports waking up to use the bathroom. She has noticed a change in her sleep patterns since breaking her radial head nine years ago, which has caused her to toss and turn at night. She also reports a recent incident where she stopped breathing while sleeping in a car, which was noticed by her husband.  In addition to her sleep concerns, the patient reports frustration with her weight and specifically the size of her stomach, which she believes is due to her insulin use. She reports that she has lost weight but her waist size has increased. She is currently on a once-weekly dose of Ozempic, which was reduced from twice-weekly due to feelings of constant fullness and nausea. She expresses interest in possibly switching to [facility] for better weight loss and fewer side effects.  The patient also reports a reaction to the adhesive on her Dexcom monitor, which has caused a red, itchy, and burning rash. She has found a solution by wearing a plastic piece underneath the monitor.  Lastly, the patient reports that her asthma has not been too bad recently and that she likes her Qvar inhaler. She also mentions a recent foot surgery that has caused her to be less active due to pain, and that there may be a need for another surgery to remove the hardware that was put in.      Patient Active  Problem List   Diagnosis Date Noted   Acute non-recurrent pansinusitis 03/10/2022   Dizzy 03/10/2022   Seasonal allergies 10/26/2021   Asthma, chronic, unspecified asthma severity, uncomplicated 10/26/2021   Neuritis 02/14/2020   Mild intermittent asthma without complication 11/08/2019   Dermatitis 11/08/2019   Type 2 diabetes mellitus with hyperglycemia, without long-term current use of insulin (HCC) 02/22/2018   BPV (benign positional vertigo) 01/04/2016   Asthma with exacerbation 09/22/2014   Acute bronchitis 04/24/2014   Hyperlipidemia 03/27/2014   Right-sided Bell's palsy 04/05/2013   ALLERGIC RHINITIS 07/08/2010   GERD 07/08/2010   WEIGHT GAIN 01/13/2010   KERATOSIS 10/09/2008   BURSITIS, RIGHT SHOULDER 10/09/2008   HOARSENESS, CHRONIC 03/24/2008   OTITIS MEDIA, SEROUS, CHRONIC, RIGHT 03/26/2007   Essential hypertension 03/14/2007   Past Medical History:  Diagnosis Date   Arthritis    left foot   Asthma    Bell palsy 2015   Diabetes mellitus without complication (HCC)    type 2   Endometriosis    GERD (gastroesophageal reflux disease)    H/O hiatal hernia    Histoplasmosis 2002   history of, [place] raised   Hypertension    Kidney stone    Kidney stones    Past Surgical History:  Procedure Laterality Date   FOOT ARTHRODESIS Left 09/05/2019   Procedure: Left Modified McBride, Left First and Second Tarsometatarsal Joint Arthrodesis;  Surgeon: Toni Arthurs, MD;  Location: [facility];  Service: Orthopedics;  Laterality: Left;   laparatomy  1985   RADIAL HEAD ARTHROPLASTY Left 02/23/2013   Procedure: LEFT RADIAL HEAD ARTHROPLASTY qwith ligament reconstruction;  Surgeon: Dominica Severin, MD;  Location: [facility] OR;  Service: Orthopedics;  Laterality: Left;   TONSILLECTOMY AND ADENOIDECTOMY  1967   uterine lining ablation  2002   Social History   Tobacco Use   Smoking status: Never   Smokeless tobacco: Never  Substance Use Topics   Alcohol use: No    Drug use: No   Social History   Socioeconomic History   Marital status: Married    Spouse name: Not on file   Number of children: Not on file   Years of education: Not on file   Highest education level: 12th grade  Occupational History    Comment: unemployed  Tobacco Use   Smoking status: Never   Smokeless tobacco: Never  Substance and Sexual Activity   Alcohol use: No   Drug use: No   Sexual activity: Yes    Partners: Male  Other Topics Concern   Not on file  Social History Narrative   Exercise-- no   Social Determinants of Health   Financial Resource Strain: Low Risk  (01/19/2023)   Overall Financial Resource Strain (CARDIA)    Difficulty of Paying Living Expenses: Not hard at all  Food Insecurity: No Food Insecurity (01/19/2023)   Hunger Vital Sign    Worried About Running Out of Food in the Last Year: Never true    Ran Out of Food in the Last Year: Never true  Transportation Needs: No Transportation Needs (01/19/2023)   PRAPARE - Administrator, Civil Service (Medical): No    Lack of Transportation (Non-Medical): No  Physical Activity: Insufficiently Active (01/19/2023)   Exercise Vital Sign    Days of Exercise per Week: 1 day    Minutes of Exercise per Session: 10 min  Stress: No Stress Concern Present (01/19/2023)   [name] of Occupational Health - Occupational Stress Questionnaire    Feeling of Stress : Not at all  Social Connections: Socially Integrated (01/19/2023)   Social Connection and Isolation Panel [NHANES]    Frequency of Communication with Friends and Family: More than three times a week    Frequency of Social Gatherings with Friends and Family: Once a week    Attends Religious Services: More than 4 times per year    Active Member of [employer] or Organizations: Yes    Attends Engineer, structural: More than 4 times per year    Marital Status: Married  Catering manager Violence: Not on file   Family Status  Relation Name Status    Mother  (Not Specified)   Father  (Not Specified)  No partnership data on file   Family History  Problem Relation Age of Onset   Diabetes Mother    Hyperlipidemia Mother    Hypertension Mother    Diabetes Father    Hyperlipidemia Father    Hypertension Father    Allergies  Allergen Reactions   Jardiance [Empagliflozin] Other (See Comments)    Persistent yeast infection    Metformin And Related Diarrhea   Pravastatin Other (See Comments)    Increased blood glucose?     Discussed the use of AI scribe software for clinical note transcription with the patient, who gave verbal consent to proceed.   Review of Systems  Constitutional:  Negative for chills, fever and malaise/fatigue.  HENT:  Negative for congestion and  hearing loss.   Eyes:  Negative for blurred vision and discharge.  Respiratory:  Negative for cough, sputum production and shortness of breath.   Cardiovascular:  Negative for chest pain, palpitations and leg swelling.  Gastrointestinal:  Negative for abdominal pain, blood in stool, constipation, diarrhea, heartburn, nausea and vomiting.  Genitourinary:  Negative for dysuria, frequency, hematuria and urgency.  Musculoskeletal:  Negative for back pain, falls and myalgias.  Skin:  Negative for rash.  Neurological:  Negative for dizziness, sensory change, loss of consciousness, weakness and headaches.  Endo/Heme/Allergies:  Negative for environmental allergies. Does not bruise/bleed easily.  Psychiatric/Behavioral:  Negative for depression and suicidal ideas. The patient is not nervous/anxious and does not have insomnia.       Objective:     BP 120/70 (BP Location: Left Arm, Patient Position: Sitting, Cuff Size: Normal)   Pulse 87   Temp 98.7 F (37.1 C) (Oral)   Resp 18   Ht 5\' 3"  (1.6 m)   Wt 161 lb 9.6 oz (73.3 kg)   SpO2 98%   BMI 28.63 kg/m  BP Readings from Last 3 Encounters:  01/20/23 120/70  01/11/23 120/70  09/06/22 120/84   Wt Readings from Last  3 Encounters:  01/20/23 161 lb 9.6 oz (73.3 kg)  01/11/23 160 lb 9.6 oz (72.8 kg)  09/06/22 157 lb 6.4 oz (71.4 kg)   SpO2 Readings from Last 3 Encounters:  01/20/23 98%  01/11/23 96%  09/06/22 97%      Physical Exam Vitals and nursing note reviewed.  Constitutional:      General: She is not in acute distress.    Appearance: Normal appearance. She is well-developed.  HENT:     Head: Normocephalic and atraumatic.  Eyes:     General: No scleral icterus.       Right eye: No discharge.        Left eye: No discharge.  Cardiovascular:     Rate and Rhythm: Normal rate and regular rhythm.     Heart sounds: No murmur heard. Pulmonary:     Effort: Pulmonary effort is normal. No respiratory distress.     Breath sounds: Normal breath sounds.  Musculoskeletal:        General: Normal range of motion.     Cervical back: Normal range of motion and neck supple.     Right lower leg: No edema.     Left lower leg: No edema.  Skin:    General: Skin is warm and dry.  Neurological:     Mental Status: She is alert and oriented to person, place, and time.  Psychiatric:        Mood and Affect: Mood normal.        Behavior: Behavior normal.        Thought Content: Thought content normal.        Judgment: Judgment normal.      No results found for any visits on 01/20/23.  Last CBC Lab Results  Component Value Date   WBC 5.4 03/10/2022   HGB 15.0 03/10/2022   HCT 45.2 03/10/2022   MCV 89 03/10/2022   MCH 29.6 03/10/2022   RDW 13.0 03/10/2022   PLT 157 03/10/2022   Last metabolic panel Lab Results  Component Value Date   GLUCOSE 147 (H) 03/10/2022   NA 144 03/10/2022   K 4.3 03/10/2022   CL 104 03/10/2022   CO2 20 03/10/2022   BUN 12 03/10/2022   CREATININE 0.84 03/10/2022   EGFR  78 03/10/2022   CALCIUM 9.7 03/10/2022   PROT 6.5 03/10/2022   ALBUMIN 4.5 03/10/2022   LABGLOB 2.0 03/10/2022   AGRATIO 2.3 (H) 03/10/2022   BILITOT 0.6 03/10/2022   ALKPHOS 89 03/10/2022    AST 31 03/10/2022   ALT 41 (H) 03/10/2022   Last lipids Lab Results  Component Value Date   CHOL 136 10/27/2021   HDL 48 10/27/2021   LDLCALC 69 10/27/2021   TRIG 102 10/27/2021   CHOLHDL 3.1 11/19/2020   Last hemoglobin A1c Lab Results  Component Value Date   HGBA1C 6.7 (A) 09/06/2022   Last thyroid functions Lab Results  Component Value Date   TSH 1.650 03/10/2022   Last vitamin D No results found for: "25OHVITD2", "25OHVITD3", "VD25OH" Last vitamin B12 and Folate Lab Results  Component Value Date   VITAMINB12 256 03/10/2022      The 10-year ASCVD risk score (Arnett DK, et al., 2019) is: 9.6%    Assessment & Plan:   Problem List Items Addressed This Visit       Unprioritized   GERD   Relevant Medications   omeprazole (PRILOSEC) 40 MG capsule   Type 2 diabetes mellitus with hyperglycemia, without long-term current use of insulin (HCC)    Per endo      Relevant Medications   losartan (COZAAR) 100 MG tablet   Mild intermittent asthma without complication    stable      Hyperlipidemia    Encourage heart healthy diet such as MIND or DASH diet, increase exercise, avoid trans fats, simple carbohydrates and processed foods, consider a krill or fish or flaxseed oil cap daily.        Relevant Medications   losartan (COZAAR) 100 MG tablet   Other Relevant Orders   CBC with Differential/Platelet   Comprehensive metabolic panel   Lipid panel   TSH   Essential hypertension    Well controlled, no changes to meds. Encouraged heart healthy diet such as the DASH diet and exercise as tolerated.        Relevant Medications   losartan (COZAAR) 100 MG tablet   Other Relevant Orders   CBC with Differential/Platelet   Comprehensive metabolic panel   Lipid panel   TSH   Other Visit Diagnoses     Snoring    -  Primary   Relevant Orders   Ambulatory referral to Neurology   Need for influenza vaccination       Relevant Orders   Flu vaccine trivalent PF, 6mos  and older(Flulaval,Afluria,Fluarix,Fluzone) (Completed)     Assessment and Plan    Possible Sleep Apnea Reports of snoring, waking up feeling unrested, and an episode of observed apnea during sleep. -Refer to sleep specialist for further evaluation.  Type 2 Diabetes Currently on Ozempic 1mg  weekly and insulin via Omnipod. Reports of weight gain and abdominal distension. Discussed possible switch to [facility] for better weight loss and lesser side effects. -Consider switching to [facility] after discussion with endocrinologist.  Hypertriglyceridemia Currently on Fenofibrate 160mg  daily. Issue with insurance approval for medication refill. -Resubmitted prescription for Fenofibrate 160mg  daily. -Advise patient to contact insurance for clarification if issue persists.  Asthma Well controlled, currently on Qvar and ProAir as needed. -Continue current regimen.  General Health Maintenance -Administer influenza vaccine today. -Consider Pneumonia vaccine next year.        No follow-ups on file.    Donato Schultz, DO

## 2023-01-20 NOTE — Assessment & Plan Note (Signed)
Encourage heart healthy diet such as MIND or DASH diet, increase exercise, avoid trans fats, simple carbohydrates and processed foods, consider a krill or fish or flaxseed oil cap daily.  °

## 2023-01-20 NOTE — Assessment & Plan Note (Signed)
Well controlled, no changes to meds. Encouraged heart healthy diet such as the DASH diet and exercise as tolerated.  °

## 2023-01-20 NOTE — Assessment & Plan Note (Signed)
Per endo °

## 2023-02-08 ENCOUNTER — Encounter: Payer: Self-pay | Admitting: Internal Medicine

## 2023-02-16 ENCOUNTER — Encounter: Payer: Self-pay | Admitting: Internal Medicine

## 2023-02-17 ENCOUNTER — Other Ambulatory Visit: Payer: Self-pay

## 2023-02-17 DIAGNOSIS — E1165 Type 2 diabetes mellitus with hyperglycemia: Secondary | ICD-10-CM

## 2023-02-17 MED ORDER — DEXCOM G6 SENSOR MISC: 3 refills | Status: DC

## 2023-02-17 MED ORDER — DEXCOM G6 TRANSMITTER MISC: 3 refills | Status: DC

## 2023-02-17 NOTE — Telephone Encounter (Signed)
Dexcom sensor and transmitter request sent to patient's pharmacy

## 2023-03-13 ENCOUNTER — Ambulatory Visit: Payer: 59 | Admitting: Neurology

## 2023-03-13 ENCOUNTER — Encounter: Payer: Self-pay | Admitting: Neurology

## 2023-03-13 VITALS — BP 130/68 | HR 91 | Ht 63.0 in | Wt 159.8 lb

## 2023-03-13 DIAGNOSIS — R0683 Snoring: Secondary | ICD-10-CM

## 2023-03-13 DIAGNOSIS — G478 Other sleep disorders: Secondary | ICD-10-CM | POA: Diagnosis not present

## 2023-03-13 DIAGNOSIS — Z9189 Other specified personal risk factors, not elsewhere classified: Secondary | ICD-10-CM

## 2023-03-13 DIAGNOSIS — G4700 Insomnia, unspecified: Secondary | ICD-10-CM

## 2023-03-13 DIAGNOSIS — R351 Nocturia: Secondary | ICD-10-CM

## 2023-03-13 DIAGNOSIS — R0681 Apnea, not elsewhere classified: Secondary | ICD-10-CM

## 2023-03-13 DIAGNOSIS — Z820 Family history of epilepsy and other diseases of the nervous system: Secondary | ICD-10-CM | POA: Diagnosis not present

## 2023-03-13 NOTE — Patient Instructions (Signed)

## 2023-03-13 NOTE — Progress Notes (Signed)
Subjective:    Patient ID: Dominique [name] is a 62 y.o. female.  HPI    Dominique Foley, MD, PhD [facility] [address], [address] [address] [place], [place] [address]  Dear Dr. Zola [name],   I saw your patient, Dominique [name], upon your kind request in my sleep clinic today for initial consultation of her sleep disorder, in particular, concern for underlying obstructive sleep apnea.  The patient is unaccompanied today.  As you know, Dominique [name] is a 62 year old female with an underlying medical history of reflux disease, hypertension, hyperlipidemia, diabetes, asthma, remote history of Bell's palsy on the R (previously had seen Dr. Anne [name] and Dominique Ege, NP in this clinic), endometriosis, histoplasmosis, history of kidney stones, arthritis, and overweight state, who reports snoring and difficulty maintaining sleep, as well as nonrestorative sleep, witnessed apneas and nocturia.  Her Epworth sleepiness score is 6 out of 24, fatigue severity score is 36 out of 63.  She lives with her husband, he has mentioned her snoring to her and also rare occasions of gasping sounds and breathing pauses in her sleep.  She has asthma and sleeps with a fan on, cannot tolerate a ceiling fan.  She has a TV in her bedroom but it is typically not on at night.  She does not work.  With her grown daughter lives with them.  She has a sister with sleep apnea who uses a PAP machine.  She reports that she cannot tolerate a mask on her face.  For her asthma she uses her Qvar but not daily.  She is advised to talk to you about the frequency of Qvar use, it is written for 2 puffs twice daily but she uses it only as needed.  She has tried over-the-counter medications for sleep including melatonin, p.m. medications and Benadryl, they helped a little bit.  She does not like to take anything consistently, she does not take any Xanax currently.  She goes to bed around 10 but it may be up to 2 hours before  she falls asleep.  Sometimes she falls asleep within 15 minutes.  Her rise time is around 7.  She has nocturia typically once per average night.  She denies recurrent nocturnal or morning headaches.  She drinks caffeine in the form of diet Dr. [name], up to 2 bottles per day, 12 ounce size.  She does not currently drink any alcohol.  She is a non-smoker.  She tries not to nap during the day.  She had a tonsillectomy at age 21.  I reviewed your office note from 01/20/2023.  Her Past Medical History Is Significant For: Past Medical History:  Diagnosis Date   Arthritis    left foot   Asthma    Bell palsy 2015   Diabetes mellitus without complication (HCC)    type 2   Endometriosis    GERD (gastroesophageal reflux disease)    H/O hiatal hernia    Histoplasmosis 2002   history of, [place] raised   Hypertension    Kidney stone    Kidney stones     Her Past Surgical History Is Significant For: Past Surgical History:  Procedure Laterality Date   FOOT ARTHRODESIS Left 09/05/2019   Procedure: Left Modified McBride, Left First and Second Tarsometatarsal Joint Arthrodesis;  Surgeon: Dominique Arthurs, MD;  Location: [facility];  Service: Orthopedics;  Laterality: Left;   laparatomy  1985   RADIAL HEAD ARTHROPLASTY Left 02/23/2013   Procedure: LEFT RADIAL  HEAD ARTHROPLASTY qwith ligament reconstruction;  Surgeon: Dominique Severin, MD;  Location: [facility] OR;  Service: Orthopedics;  Laterality: Left;   TONSILLECTOMY AND ADENOIDECTOMY  1967   uterine lining ablation  2002    Her Family History Is Significant For: Family History  Problem Relation Age of Onset   Diabetes Mother    Hyperlipidemia Mother    Hypertension Mother    Diabetes Father    Hyperlipidemia Father    Hypertension Father    Sleep apnea Sister     Her Social History Is Significant For: Social History   Socioeconomic History   Marital status: Married    Spouse name: Not on file   Number of children: Not on file    Years of education: Not on file   Highest education level: 12th grade  Occupational History    Comment: unemployed  Tobacco Use   Smoking status: Never   Smokeless tobacco: Never  Substance and Sexual Activity   Alcohol use: No   Drug use: No   Sexual activity: Yes    Partners: Male  Other Topics Concern   Not on file  Social History Narrative   Exercise-- no   Social Determinants of Health   Financial Resource Strain: Low Risk  (01/19/2023)   Overall Financial Resource Strain (CARDIA)    Difficulty of Paying Living Expenses: Not hard at all  Food Insecurity: No Food Insecurity (01/19/2023)   Hunger Vital Sign    Worried About Running Out of Food in the Last Year: Never true    Ran Out of Food in the Last Year: Never true  Transportation Needs: No Transportation Needs (01/19/2023)   PRAPARE - Administrator, Civil Service (Medical): No    Lack of Transportation (Non-Medical): No  Physical Activity: Insufficiently Active (01/19/2023)   Exercise Vital Sign    Days of Exercise per Week: 1 day    Minutes of Exercise per Session: 10 min  Stress: No Stress Concern Present (01/19/2023)   [name] of Occupational Health - Occupational Stress Questionnaire    Feeling of Stress : Not at all  Social Connections: Socially Integrated (01/19/2023)   Social Connection and Isolation Panel [NHANES]    Frequency of Communication with Friends and Family: More than three times a week    Frequency of Social Gatherings with Friends and Family: Once a week    Attends Religious Services: More than 4 times per year    Active Member of [employer] or Organizations: Yes    Attends Engineer, structural: More than 4 times per year    Marital Status: Married    Her Allergies Are:  Allergies  Allergen Reactions   Jardiance [Empagliflozin] Other (See Comments)    Persistent yeast infection    Metformin And Related Diarrhea   Pravastatin Other (See Comments)    Increased blood  glucose?   :   Her Current Medications Are:  Outpatient Encounter Medications as of 03/13/2023  Medication Sig   albuterol (VENTOLIN HFA) 108 (90 Base) MCG/ACT inhaler INHALE 2 PUFFS INTO THE LUNGS EVERY 6 HOURS AS NEEDED FOR WHEEZING OR SHORTNESS OF BREATH   beclomethasone (QVAR REDIHALER) 40 MCG/ACT inhaler Inhale 2 puffs into the lungs 2 (two) times daily.   Blood Glucose Monitoring Suppl (CONTOUR NEXT ONE) KIT 1 kit by Does not apply route See admin instructions. For checking blood sugar 2 times a day   Cholecalciferol (VITAMIN D3 PO) Take 50 mcg by mouth daily.   Continuous  Glucose Sensor (DEXCOM G6 SENSOR) MISC Use as instructed to check blood sugar. Change every 10 days   Continuous Glucose Transmitter (DEXCOM G6 TRANSMITTER) MISC Use as instructed to check blood sugar. Change every 90 days.   ezetimibe (ZETIA) 10 MG tablet TAKE 1 TABLET(10 MG) BY MOUTH DAILY   fenofibrate 160 MG tablet 1 po qd   fluticasone (FLONASE) 50 MCG/ACT nasal spray SHAKE LIQUID AND USE 2 SPRAYS IN EACH NOSTRIL DAILY   fluticasone (FLOVENT HFA) 110 MCG/ACT inhaler Inhale 2 puffs into the lungs 2 (two) times daily.   glucose blood (CONTOUR NEXT TEST) test strip USE TO CHECK BLOOD SUGAR TWICE DAILY   Insulin Disposable Pump (OMNIPOD 5 G6 PODS, GEN 5,) MISC 1 each by Does not apply route every other day.   insulin glargine (LANTUS SOLOSTAR) 100 UNIT/ML Solostar Pen Inject 30 Units into the skin at bedtime.   Insulin Lispro-aabc (LYUMJEV KWIKPEN) 200 UNIT/ML KwikPen Inject 10-16 units under skin 3x a day as advised   Insulin Lispro-aabc (LYUMJEV) 100 UNIT/ML SOLN Use up to 80 units daily and insulin pump   Insulin Pen Needle (BD PEN NEEDLE NANO 2ND GEN) 32G X 4 MM MISC Inject 1 each into the skin 3 (three) times daily. as directed   levalbuterol (XOPENEX) 1.25 MG/3ML nebulizer solution USE ONE VIAL BY NEBULIZATION EVERY 6 HOURS AS NEEDED FOR WHEEZING OR SHORTNESS OF BREATH   losartan (COZAAR) 100 MG tablet Take 1  tablet (100 mg total) by mouth daily.   magnesium 30 MG tablet Take by mouth daily.   Microlet Lancets MISC TEST BLOOD SUGAR TWICE DAILY   montelukast (SINGULAIR) 10 MG tablet Take 1 tablet (10 mg total) by mouth at bedtime.   NONFORMULARY OR COMPOUNDED ITEM Cmp, lipid   Dx hyperlipidemia   NONFORMULARY OR COMPOUNDED ITEM Cmp, lipid--- dx htn and hyperlipidemia Microalbumin---  dx DM II   NONFORMULARY OR COMPOUNDED ITEM Hep C antibody--- need for hep c screen   NONFORMULARY OR COMPOUNDED ITEM Cbcd , cmp, lipid----- dx hyperlipidemia, htn, dm II   Omega-3 Fatty Acids (FISH OIL) 1000 MG CAPS Take by mouth.   omeprazole (PRILOSEC) 40 MG capsule TAKE 1 CAPSULE(40 MG) BY MOUTH DAILY   Semaglutide, 1 MG/DOSE, (OZEMPIC, 1 MG/DOSE,) 4 MG/3ML SOPN INJECT 1 MG UNDER THE SKIN ONCE A WEEK   vitamin C (ASCORBIC ACID) 500 MG tablet Take 500 mg by mouth daily.   ALPRAZolam (XANAX) 1 MG tablet Take 1 tablet (1 mg total) by mouth 2 (two) times daily as needed for anxiety.   No facility-administered encounter medications on file as of 03/13/2023.  :   Review of Systems:  Out of a complete 14 point review of systems, all are reviewed and negative with the exception of these symptoms as listed below:  Review of Systems  Neurological:        Pt here for sleep consult Pt snores,fatigue,little headaches,controlled hypertension  Pt denies sleep study,CPAP machine    ESS FSS        Objective:  Neurological Exam  Physical Exam Physical Examination:   Vitals:   03/13/23 0906  BP: 130/68  Pulse: 91    General Examination: The patient is a very pleasant 62 y.o. female in no acute distress. She appears well-developed and well-nourished and well groomed.   HEENT: Normocephalic, atraumatic, pupils are equal, round and reactive to light, extraocular tracking is good without limitation to gaze excursion or nystagmus noted. Hearing is grossly intact. Face is asymmetric with  right facial weakness noted.  Speech is clear with no dysarthria noted. There is no hypophonia. There is no lip, neck/head, jaw or voice tremor. Neck is supple with full range of passive and active motion. There are no carotid bruits on auscultation. Oropharynx exam reveals: moderate mouth dryness, adequate dental hygiene and moderate airway crowding, due to small airway entry, Mallampati class IV.  Tip of uvula not fully visualized even with phonation.  Tongue protrudes centrally.  Tonsils absen.  Neck circumference 16-5/8 inches, mild overbite noted.   Chest: Clear to auscultation without wheezing, rhonchi or crackles noted.  Heart: S1+S2+0, regular and normal without murmurs, rubs or gallops noted.   Abdomen: Soft, non-tender and non-distended.  Extremities: There is no pitting edema in the distal lower extremities bilaterally.   Skin: Warm and dry without trophic changes noted.   Musculoskeletal: exam reveals limited mobility left foot, mild forefoot swelling left foot, she has hardware in place in the foot.    Neurologically:  Mental status: The patient is awake, alert and oriented in all 4 spheres. Her immediate and remote memory, attention, language skills and fund of knowledge are appropriate. There is no evidence of aphasia, agnosia, apraxia or anomia. Speech is clear with normal prosody and enunciation. Thought process is linear. Mood is normal and affect is normal.  Cranial nerves II - XII are as described above under HEENT exam.  Motor exam: Normal bulk, strength and tone is noted. There is no obvious action or resting tremor.  Fine motor skills and coordination: grossly intact.  Cerebellar testing: No dysmetria or intention tremor. There is no truncal or gait ataxia.  Sensory exam: intact to light touch in the upper and lower extremities.  Gait, station and balance: She stands easily. No veering to one side is noted. No leaning to one side is noted. Posture is age-appropriate and stance is narrow based. Gait  shows normal stride length and normal pace. No problems turning are noted.   Assessment and Plan:  In summary, Dominique [name] is a very pleasant 62 y.o.-year old female with an underlying medical history of reflux disease, hypertension, hyperlipidemia, diabetes, asthma, remote history of Bell's palsy (previously had seen Dr. Anne [name] and Dominique Ege, NP in this clinic), endometriosis, histoplasmosis, history of kidney stones, arthritis, and overweight state, whose history and physical exam are concerning for sleep disordered breathing, particularly obstructive sleep apnea (OSA).  We mutually agreed to proceed with a home sleep test at this time.  I had a long chat with the patient about my findings and the diagnosis of sleep apnea, particularly OSA, its prognosis and treatment options. We talked about medical/conservative treatments, surgical interventions and non-pharmacological approaches for symptom control. I explained, in particular, the risks and ramifications of untreated moderate to severe OSA, especially with respect to developing cardiovascular disease down the road, including congestive heart failure (CHF), difficult to treat hypertension, cardiac arrhythmias (particularly A-fib), neurovascular complications including TIA, stroke and dementia. Even type 2 diabetes has, in part, been linked to untreated OSA. Symptoms of untreated OSA may include (but may not be limited to) daytime sleepiness, nocturia (i.e. frequent nighttime urination), memory problems, mood irritability and suboptimally controlled or worsening mood disorder such as depression and/or anxiety, lack of energy, lack of motivation, physical discomfort, as well as recurrent headaches, especially morning or nocturnal headaches. We talked about the importance of maintaining a healthy lifestyle and striving for healthy weight.  I recommended a home sleep test.  I explained the test procedure  to the patient.   I outlined possible surgical  and non-surgical treatment options of OSA, including the use of a positive airway pressure (PAP) device (i.e. CPAP, AutoPAP/APAP or BiPAP in certain circumstances), a custom-made dental device (aka oral appliance, which would require a referral to a specialist dentist or orthodontist typically, and is generally speaking not considered for patients with full dentures or edentulous state), upper airway surgical options, such as traditional UPPP (which is not considered a first-line treatment) or the Inspire device (hypoglossal nerve stimulator, which would involve a referral for consultation with an ENT surgeon, after careful selection, following inclusion criteria - also not first-line treatment). I explained the PAP treatment option to the patient in detail, as this is generally considered first-line treatment.  The patient indicated that she would be willing to try PAP therapy, if the need arises. I explained the importance of being compliant with PAP treatment, not only for insurance purposes but primarily to improve patient's symptoms symptoms, and for the patient's long term health benefit, including to reduce Her cardiovascular risks longer-term.    We will pick up our discussion about the next steps and treatment options after testing.  We will keep her posted as to the test results by phone call and/or MyChart messaging where possible.  We will plan to follow-up in sleep clinic accordingly as well.  I answered her questions today and the patient was in agreement.   I encouraged her to call with any interim questions, concerns, problems or updates or email [place] through MyChart.  Generally speaking, sleep test authorizations may take up to 2 weeks, sometimes less, sometimes longer, the patient is encouraged to get in touch with [place] if they do not hear back from the sleep lab staff directly within the next 2 weeks.  Thank you very much for allowing me to participate in the care of this nice patient. If I can  be of any further assistance to you please do not hesitate to call me at [phone].  Sincerely,   Dominique Foley, MD, PhD

## 2023-03-18 ENCOUNTER — Encounter: Payer: Self-pay | Admitting: Neurology

## 2023-03-24 ENCOUNTER — Other Ambulatory Visit: Payer: Self-pay | Admitting: Internal Medicine

## 2023-03-24 NOTE — Telephone Encounter (Signed)
Ozempic refill request complete

## 2023-03-27 ENCOUNTER — Encounter: Payer: Self-pay | Admitting: Internal Medicine

## 2023-03-27 ENCOUNTER — Other Ambulatory Visit: Payer: Self-pay | Admitting: Internal Medicine

## 2023-04-12 ENCOUNTER — Encounter: Payer: Self-pay | Admitting: Family Medicine

## 2023-04-12 NOTE — Telephone Encounter (Signed)
Care team updated and letter sent for eye exam notes.

## 2023-04-16 ENCOUNTER — Encounter: Payer: Self-pay | Admitting: Family Medicine

## 2023-04-16 DIAGNOSIS — E785 Hyperlipidemia, unspecified: Secondary | ICD-10-CM

## 2023-04-16 DIAGNOSIS — J302 Other seasonal allergic rhinitis: Secondary | ICD-10-CM

## 2023-04-17 MED ORDER — FLUTICASONE PROPIONATE 50 MCG/ACT NA SUSP: NASAL | 3 refills | Status: DC

## 2023-04-17 MED ORDER — EZETIMIBE 10 MG PO TABS
ORAL_TABLET | ORAL | 1 refills | Status: DC
Start: 2023-04-17 — End: 2023-12-18

## 2023-04-19 ENCOUNTER — Encounter: Payer: Self-pay | Admitting: Internal Medicine

## 2023-04-19 ENCOUNTER — Ambulatory Visit: Payer: 59 | Admitting: Internal Medicine

## 2023-04-19 VITALS — BP 122/64 | HR 89 | Ht 63.0 in | Wt 161.8 lb

## 2023-04-19 DIAGNOSIS — Z794 Long term (current) use of insulin: Secondary | ICD-10-CM | POA: Diagnosis not present

## 2023-04-19 DIAGNOSIS — E785 Hyperlipidemia, unspecified: Secondary | ICD-10-CM

## 2023-04-19 DIAGNOSIS — E1165 Type 2 diabetes mellitus with hyperglycemia: Secondary | ICD-10-CM

## 2023-04-19 LAB — POCT GLYCOSYLATED HEMOGLOBIN (HGB A1C): Hemoglobin A1C: 6.5 % — AB (ref 4.0–5.6)

## 2023-04-19 MED ORDER — BD ALLERGY SYRINGE 28G X 1/2" 1 ML MISC: 3 refills | Status: AC

## 2023-04-19 NOTE — Patient Instructions (Addendum)
Please continue: - Ozempic 1 mg weekly  Please use the following pump settings: - Basal rates: 12 am: 1.35 - Insulin to carb ratio: 12 am: 1:1 (enter 10-16 g carbs) but up to 18 units for B and D and down to 8 units for L - Target: 12 am: 110-120 - Correction factor (insulin sensitivity factor):  12 am: 28 - Active insulin time: 4h - Changes infusion site: q2 days  Backup regimen: - Lantus 30 units daily (increase as needed) - Lyumjev 10-16 units 3x a day before meals  Please come back for a follow-up appointment in 3-4 months.

## 2023-04-19 NOTE — Progress Notes (Addendum)
Patient ID: Dominique [name], female   DOB: 1960-10-22, 62 y.o.   MRN: [id number]   HPI: Dominique [name] is a 62 y.o.-year-old female, returning for follow-up for DM2, dx in 2016, but GDM in 2000, insulin-dependent, uncontrolled, without long term complications.  She previously saw Dr. Everardo All.  Last visit with me 3 months ago.  Interim history: No increased urination, nausea, chest pain.  She does have blurry vision.  At last visit with her ophthalmologist, she was told that she had cataract.  Reviewed HbA1c levels: 01/11/2023: HbA1c 6.6% 12/19/2022: HbA1c 6.6% Lab Results  Component Value Date   HGBA1C 6.7 (A) 09/06/2022   HGBA1C 6.4 (A) 06/13/2022   HGBA1C 7.1 (A) 02/07/2022   HGBA1C 7.1 (A) 10/20/2021   HGBA1C 7.1 (A) 06/17/2021   HGBA1C 6.6 (A) 02/12/2021   HGBA1C 6.7 (A) 10/07/2020   HGBA1C 6.7 (A) 06/04/2020   HGBA1C 6.0 (H) 11/25/2019   HGBA1C 6.3 (A) 06/19/2019  HbA1c 8.9%  At last visit she was on: -  >> stopped 10/2021 - Ozempic 0.5 >> 1 mg weekly  (had nausea with 2 mg dose) - Lantus 10 units in HS - added 06/2021 >> 34 >> 40 >> 48 units in HS - Lyumjev 6-10 units before B and D - added 10/2021 We had to stop Jardiance 07/2018 due to persistent yeast infections. She stopped metformin IR due to lack of effect.  We had to stop metformin ER due to nausea. She developed a rash with Trulicity She was previously on nateglinide and also on repaglinide.  Currently on OmniPod 5 CGM (with Lyumjev): - Basal rates: 12 am: 1.35 - Insulin to carb ratio: 12 am: 1:1 (enter 10-16 g carbs) - Target: 12 am: 110-120 - Correction factor (insulin sensitivity factor):  12 am: 28 - Active insulin time: 4h - Changes infusion site: q2 days  Total daily dose from basal insulin: 57% (43 units) >> 54% (46.6 units) >> 53% (46 units) Total daily dose from bolus insulin: 43% (33 units) >> 46% (40.2 units) >> 47% (40 units) Total daily dose 86-120 units daily  Backup regimen: - Lantus  30 units daily (increase as needed) - Lyumjev 10-16 units 3x a day before meals  Pt checks her sugars  >4x a day with her Dexcom CGM:  Prev.:   Previously:  Lowest sugar was 82 >> 90 >> 80s >> 76; she has hypoglycemia awareness in the 90s. Highest sugar was 320 >> 300 >> 200s >> 318  Glucometer: Accu-Chek guide  Pt's meals are: - Breakfast: 1-2 egg, 1-2 toast; egg + bacon - Lunch: eating out, salad, meat + veggies - Dinner: eating out, salad, meat + veggies - Dessert: apple pie, cake - Snacks: chips  -No CKD, last BUN/creatinine:  Lab Results  Component Value Date   BUN 12 03/10/2022   BUN 13 10/27/2021   CREATININE 0.84 03/10/2022   CREATININE 0.80 10/27/2021   Lab Results  Component Value Date   MICRALBCREAT 10 02/14/2019  On losartan 100.  -+ HL; last set of lipids: 12/19/2022: 137/106/45/72 Lab Results  Component Value Date   CHOL 136 10/27/2021   HDL 48 10/27/2021   LDLCALC 69 10/27/2021   TRIG 102 10/27/2021   CHOLHDL 3.1 11/19/2020  On Lipitor 10, fenofibrate 160, fish oil 1000 mg 2x a day.  - last eye exam was in 08/2022: No DR reportedly, + cataracts; Dr. Hyacinth Meeker. Sees an ophthalmic surgeon >> contemplates palpebral surgery. On Xiidra and fish oil for dry  eyes.  -No numbness and tingling in her feet.  She saw neurology -no peripheral neuropathy.  Last foot exam 12/17/2022 by Dr. Allena Katz with podiatry.  Off Gabapentin - prev. For Bell's palsy. She has a history of a kidney stone in 2019.  Pt has FH of DM in mother and father.  ROS: + see HPI  I reviewed pt's medications, allergies, PMH, social hx, family hx, and changes were documented in the history of present illness. Otherwise, unchanged from my initial visit note.  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   Hypertension    Kidney stone    Kidney stones    Past Surgical History:  Procedure Laterality Date   FOOT ARTHRODESIS Left 09/05/2019   Procedure: Left Modified McBride, Left First and Second Tarsometatarsal Joint Arthrodesis;  Surgeon: Toni Arthurs, MD;  Location: [facility];  Service: Orthopedics;  Laterality: Left;   laparatomy  1985   RADIAL HEAD ARTHROPLASTY Left 02/23/2013   Procedure: LEFT RADIAL HEAD ARTHROPLASTY qwith ligament reconstruction;  Surgeon: Dominica Severin, MD;  Location: [facility] OR;  Service: Orthopedics;  Laterality: Left;   TONSILLECTOMY AND ADENOIDECTOMY  1967   uterine lining ablation  2002   Social History   Socioeconomic History   Marital status: Married    Spouse name: Not on file   Number of children: Not on file   Years of education: Not on file   Highest education level: 12th grade  Occupational History    Comment: unemployed  Tobacco Use   Smoking status: Never   Smokeless tobacco: Never  Substance and Sexual Activity   Alcohol use: No   Drug use: No   Sexual activity: Yes    Partners: Male  Other Topics Concern   Not on file  Social History Narrative   Exercise-- no   Social Determinants of Health   Financial Resource Strain: Low Risk  (01/19/2023)   Overall Financial Resource Strain (CARDIA)    Difficulty of Paying Living Expenses: Not hard at all  Food Insecurity: No Food Insecurity (01/19/2023)   Hunger Vital Sign    Worried About Running Out of Food in the Last Year: Never true    Ran Out of Food in the Last Year: Never true  Transportation Needs: No Transportation Needs (01/19/2023)   PRAPARE - Administrator, Civil Service (Medical): No    Lack of Transportation (Non-Medical): No  Physical Activity: Insufficiently Active (01/19/2023)   Exercise Vital Sign    Days of Exercise per Week: 1 day    Minutes of Exercise per Session: 10 min  Stress: No Stress Concern  Present (01/19/2023)   [name] of Occupational Health - Occupational Stress Questionnaire    Feeling of Stress : Not at all  Social Connections: Socially Integrated (01/19/2023)   Social Connection and Isolation Panel [NHANES]    Frequency of Communication with Friends and Family: More than three times a week    Frequency of Social Gatherings with Friends and Family: Once a week    Attends Religious Services: More than 4 times per year    Active Member of Clubs or Organizations: Yes    Attends  Engineer, structural: More than 4 times per year    Marital Status: Married  Catering manager Violence: Not on file   Current Outpatient Medications on File Prior to Visit  Medication Sig Dispense Refill   albuterol (VENTOLIN HFA) 108 (90 Base) MCG/ACT inhaler INHALE 2 PUFFS INTO THE LUNGS EVERY 6 HOURS AS NEEDED FOR WHEEZING OR SHORTNESS OF BREATH 20.1 g 1   ALPRAZolam (XANAX) 1 MG tablet Take 1 tablet (1 mg total) by mouth 2 (two) times daily as needed for anxiety. 20 tablet 0   beclomethasone (QVAR REDIHALER) 40 MCG/ACT inhaler Inhale 2 puffs into the lungs 2 (two) times daily. 1 each 3   Blood Glucose Monitoring Suppl (CONTOUR NEXT ONE) KIT 1 kit by Does not apply route See admin instructions. For checking blood sugar 2 times a day 1 kit 0   Cholecalciferol (VITAMIN D3 PO) Take 50 mcg by mouth daily.     Continuous Glucose Sensor (DEXCOM G6 SENSOR) MISC Use as instructed to check blood sugar. Change every 10 days 9 each 3   Continuous Glucose Transmitter (DEXCOM G6 TRANSMITTER) MISC Use as instructed to check blood sugar. Change every 90 days. 1 each 3   ezetimibe (ZETIA) 10 MG tablet TAKE 1 TABLET(10 MG) BY MOUTH DAILY 90 tablet 1   fenofibrate 160 MG tablet 1 po qd 90 tablet 3   fluticasone (FLONASE) 50 MCG/ACT nasal spray SHAKE LIQUID AND USE 2 SPRAYS IN EACH NOSTRIL DAILY 48 g 3   fluticasone (FLOVENT HFA) 110 MCG/ACT inhaler Inhale 2 puffs into the lungs 2 (two) times daily. 12  g 1   glucose blood (CONTOUR NEXT TEST) test strip USE TO CHECK BLOOD SUGAR TWICE DAILY 200 strip 5   Insulin Disposable Pump (OMNIPOD 5 G6 PODS, GEN 5,) MISC 1 each by Does not apply route every other day. 60 each 3   insulin glargine (LANTUS SOLOSTAR) 100 UNIT/ML Solostar Pen Inject 30 Units into the skin at bedtime. 15 mL 1   Insulin Lispro-aabc (LYUMJEV KWIKPEN) 200 UNIT/ML KwikPen Inject 10-16 units under skin 3x a day as advised 9 mL 1   Insulin Lispro-aabc (LYUMJEV) 100 UNIT/ML SOLN Use up to 80 units daily and insulin pump 90 mL 3   Insulin Pen Needle (BD PEN NEEDLE NANO 2ND GEN) 32G X 4 MM MISC Inject 1 each into the skin 3 (three) times daily. as directed 100 each 1   levalbuterol (XOPENEX) 1.25 MG/3ML nebulizer solution USE ONE VIAL BY NEBULIZATION EVERY 6 HOURS AS NEEDED FOR WHEEZING OR SHORTNESS OF BREATH 675 mL 1   losartan (COZAAR) 100 MG tablet Take 1 tablet (100 mg total) by mouth daily. 90 tablet 3   magnesium 30 MG tablet Take by mouth daily.     Microlet Lancets MISC TEST BLOOD SUGAR TWICE DAILY 200 each 3   montelukast (SINGULAIR) 10 MG tablet Take 1 tablet (10 mg total) by mouth at bedtime. 90 tablet 1   NONFORMULARY OR COMPOUNDED ITEM Cmp, lipid   Dx hyperlipidemia 1 each 0   NONFORMULARY OR COMPOUNDED ITEM Cmp, lipid--- dx htn and hyperlipidemia Microalbumin---  dx DM II 1 each 0   NONFORMULARY OR COMPOUNDED ITEM Hep C antibody--- need for hep c screen 1 each 0   NONFORMULARY OR COMPOUNDED ITEM Cbcd , cmp, lipid----- dx hyperlipidemia, htn, dm II 1 each 0   Omega-3 Fatty Acids (FISH OIL) 1000 MG CAPS Take by mouth.     omeprazole (PRILOSEC) 40 MG capsule TAKE  1 CAPSULE(40 MG) BY MOUTH DAILY 90 capsule 3   OZEMPIC, 1 MG/DOSE, 4 MG/3ML SOPN INJECT 1 MG UNDER THE SKIN ONCE A WEEK 9 mL 0   vitamin C (ASCORBIC ACID) 500 MG tablet Take 500 mg by mouth daily.     No current facility-administered medications on file prior to visit.   Allergies  Allergen Reactions   Jardiance  [Empagliflozin] Other (See Comments)    Persistent yeast infection    Metformin And Related Diarrhea   Pravastatin Other (See Comments)    Increased blood glucose?    Family History  Problem Relation Age of Onset   Diabetes Mother    Hyperlipidemia Mother    Hypertension Mother    Diabetes Father    Hyperlipidemia Father    Hypertension Father    Sleep apnea Sister    PE: BP 122/64   Pulse 89   Ht 5\' 3"  (1.6 m)   Wt 161 lb 12.8 oz (73.4 kg)   SpO2 98%   BMI 28.66 kg/m  Wt Readings from Last 3 Encounters:  04/19/23 161 lb 12.8 oz (73.4 kg)  03/13/23 159 lb 12.8 oz (72.5 kg)  01/20/23 161 lb 9.6 oz (73.3 kg)   Constitutional: normal weight but truncal adipose disposition, in NAD Eyes: EOMI, no exophthalmos ENT: no thyromegaly, no cervical lymphadenopathy Cardiovascular: RRR, No MRG Respiratory: CTA B Musculoskeletal: no deformities Skin: no rashes Neurological: no tremor with outstretched hands  ASSESSMENT: 1. DM2, insulin-dependent, uncontrolled, without long-term complications, but with hyperglycemia  Component     Latest Ref Rng 02/09/2022  Glutamic Acid Decarb Ab     0.0 - 5.0 U/mL <5.0   ZNT8 Antibodies     U/mL <15   Glucose, Plasma     70 - 99 mg/dL 865 (H)   C-Peptide     1.1 - 4.4 ng/mL 4.2    No evidence of pancreatic autoimmunity or insulin deficiency.  2. HL  PLAN:  1. Patient with longstanding, uncontrolled, type 2 diabetes, on the OmniPod 5 insulin pump integrated with the Dexcom CGM.  She has Lyumjev in the pump.  She could not tolerate metformin due to nausea and SGLT2 inhibitors due to yeast infections.  She does tolerate Ozempic well.  At last visit, sugars appears to be improved but they were still increasing almost consistently after breakfast and approximately half of the time after dinner.  In the rest of the day, sugars were fluctuating within the more narrow range but in the upper half of the target range.  We discussed about trying to  enter more carbs into the pump.  I refilled her Lyumjev and also gave her a backup regimen at that time.  We sent a new glucometer to her pharmacy, also.  Latest HbA1c was 6.6%, slightly lower. -She is not comfortable making changes into her pump so I usually introduce changes into the pump for her. CGM interpretation: -At today's visit, we reviewed her CGM downloads: It appears that 74% of values are in target range (goal >70%), while 26% are higher than 180 (goal <25%), and 0% are lower than 70 (goal <4%).  The calculated average blood sugar is 165.  The projected HbA1c for the next 3 months (GMI) is 7.3%. -Reviewing the CGM trends, sugars are fluctuating in the upper half of the target range, with hyperglycemic excursions occasionally after breakfast and more consistently after dinner.  She is injecting Lyumjev correctly, at the beginning of the meals.  It does appear that  she may need slightly higher doses of Lyumjev with breakfast and dinner, especially now during the holidays, but her sugars are frequently dropping after she injects the insulin for lunch so I advised her to try to back off the lunchtime dose for now.  We can continue the rest of the pump settings. -We also discussed about ocular changes with Ozempic and I advised her that these are usually connected with significant improvement in blood sugars and not specifically to Ozempic.  There are new reports of optic nerve inflammation in patients that were taking Ozempic, but causality has not been demonstrated.  Also, she was not found to have this at the visit with her ophthalmologist. - I suggested to:  Patient Instructions  Please continue: - Ozempic 1 mg weekly  Please use the following pump settings: - Basal rates: 12 am: 1.35 - Insulin to carb ratio: 12 am: 1:1 (enter 10-16 g carbs) but up to 18 units for B and D and down to 8 units for L - Target: 12 am: 110-120 - Correction factor (insulin sensitivity factor):  12 am: 28 -  Active insulin time: 4h - Changes infusion site: q2 days  Backup regimen: - Lantus 30 units daily (increase as needed) - Lyumjev 10-16 units 3x a day before meals  Please come back for a follow-up appointment in 3-4 months.  - we checked her HbA1c: 6.5% (improved) - advised to check sugars at different times of the day - 4x a day, rotating check times - advised for yearly eye exams >> she is UTD - will check CMP an todayd ACR - return to clinic in 3-4 months  2. HL -Latest lipid panel from 12/2022 was reviewed: Fractions at goal: 12/19/2022: 137/106/45/72 -She can use Lipitor 10 mg daily and fenofibrate 160 mg along with fish oil 1000 mg twice a day - actually for dry eyes.  LABS NEED TO GO TO [facility] -patient's husband works there.  Component     Latest Ref Rng 04/25/2023  Hemoglobin A1C     4.0 - 5.6 %   Glucose     70 - 99 mg/dL 119 (H)   BUN     8 - 27 mg/dL 11   Creatinine     1.47 - 1.00 mg/dL 8.29   BUN/Creatinine Ratio     12 - 28  14   Sodium     134 - 144 mmol/L 141   Potassium     3.5 - 5.2 mmol/L 4.5   Chloride     96 - 106 mmol/L 104   CO2     20 - 29 mmol/L 22   Calcium     8.7 - 10.3 mg/dL 9.7   Total Protein     6.0 - 8.5 g/dL 6.3   Albumin     3.9 - 4.9 g/dL 4.4   Globulin, Total     1.5 - 4.5 g/dL 1.9   Total Bilirubin     0.0 - 1.2 mg/dL 0.6   Alkaline Phosphatase     44 - 121 IU/L 115   AST     0 - 40 IU/L 33   ALT     0 - 32 IU/L 40 (H)   Creatinine, Urine WILL FOLLOW (P)  Microalbumin, Urine WILL FOLLOW (P)  MICROALB/CREAT RATIO WILL FOLLOW (P)  eGFR     >59 mL/min/1.73 82   Labs are at goal with the exception of the high glucose and a slightly elevated ALT.  This is stable compared to before. ACR is pending.  Carlus Pavlov, MD PhD [facility]

## 2023-04-21 MED ORDER — DEXCOM G7 SENSOR MISC: 3 refills | Status: DC

## 2023-04-21 MED ORDER — OMNIPOD 5 G7 INTRO (GEN 5) KIT: 1.0000 | PACK | Freq: Once | 0 refills | Status: AC

## 2023-04-21 MED ORDER — OMNIPOD 5 G7 PODS (GEN 5) MISC: 1.0000 | 3 refills | Status: DC

## 2023-04-25 ENCOUNTER — Encounter: Payer: Self-pay | Admitting: Internal Medicine

## 2023-04-26 ENCOUNTER — Encounter: Payer: Self-pay | Admitting: Internal Medicine

## 2023-04-26 ENCOUNTER — Encounter: Payer: Self-pay | Admitting: Family Medicine

## 2023-04-27 LAB — COMPREHENSIVE METABOLIC PANEL
ALT: 40 [IU]/L — ABNORMAL HIGH (ref 0–32)
AST: 33 [IU]/L (ref 0–40)
Albumin: 4.4 g/dL (ref 3.9–4.9)
Alkaline Phosphatase: 115 [IU]/L (ref 44–121)
BUN/Creatinine Ratio: 14 (ref 12–28)
BUN: 11 mg/dL (ref 8–27)
Bilirubin Total: 0.6 mg/dL (ref 0.0–1.2)
CO2: 22 mmol/L (ref 20–29)
Calcium: 9.7 mg/dL (ref 8.7–10.3)
Chloride: 104 mmol/L (ref 96–106)
Creatinine, Ser: 0.8 mg/dL (ref 0.57–1.00)
Globulin, Total: 1.9 g/dL (ref 1.5–4.5)
Glucose: 216 mg/dL — ABNORMAL HIGH (ref 70–99)
Potassium: 4.5 mmol/L (ref 3.5–5.2)
Sodium: 141 mmol/L (ref 134–144)
Total Protein: 6.3 g/dL (ref 6.0–8.5)
eGFR: 82 mL/min/{1.73_m2} (ref >59)

## 2023-04-27 LAB — MICROALBUMIN / CREATININE URINE RATIO
Creatinine, Urine: 115.5 mg/dL
Microalb/Creat Ratio: 7 mg/g{creat} (ref 0–29)
Microalbumin, Urine: 8.6 ug/mL

## 2023-04-28 MED ORDER — OMNIPOD 5 G7 PODS (GEN 5) MISC: 1.0000 | 3 refills | Status: DC

## 2023-05-18 ENCOUNTER — Other Ambulatory Visit: Payer: Self-pay | Admitting: Family Medicine

## 2023-05-18 ENCOUNTER — Encounter: Payer: Self-pay | Admitting: Family Medicine

## 2023-05-18 DIAGNOSIS — J4520 Mild intermittent asthma, uncomplicated: Secondary | ICD-10-CM

## 2023-06-13 ENCOUNTER — Encounter: Payer: Self-pay | Admitting: Internal Medicine

## 2023-06-14 NOTE — Telephone Encounter (Signed)
Letter has been typed, and provided to the pt.

## 2023-06-30 ENCOUNTER — Ambulatory Visit: Payer: 59 | Admitting: Podiatry

## 2023-06-30 ENCOUNTER — Encounter: Payer: Self-pay | Admitting: Podiatry

## 2023-06-30 ENCOUNTER — Other Ambulatory Visit: Payer: Self-pay | Admitting: Internal Medicine

## 2023-06-30 VITALS — Ht 63.0 in | Wt 161.8 lb

## 2023-06-30 DIAGNOSIS — T85848D Pain due to other internal prosthetic devices, implants and grafts, subsequent encounter: Secondary | ICD-10-CM | POA: Diagnosis not present

## 2023-06-30 DIAGNOSIS — Z01818 Encounter for other preprocedural examination: Secondary | ICD-10-CM | POA: Diagnosis not present

## 2023-06-30 NOTE — Progress Notes (Signed)
 Subjective:  Patient ID: Dominique [name], female    DOB: [birth year],  MRN: [id number]  Chief Complaint  Patient presents with   Foot Pain    Pt is here to discuss surgery options and MRI that was previously done pt has more questions wants to understand why she is having the pain that she is having.    63 y.o. female presents with the above complaint.  Patient presents for follow-up of left dorsal midfoot hardware pain.  She states that she is doing better her A1c is controlled she would like to have it removed.  She has not had as much relief.  Denies any other acute complaints.  Pain scale is 6 5 out of 10.   Review of Systems: Negative except as noted in the HPI. Denies N/V/F/Ch.  Past Medical History:  Diagnosis Date   Arthritis    left foot   Asthma    Bell palsy 2015   Diabetes mellitus without complication (HCC)    type 2   Endometriosis    GERD (gastroesophageal reflux disease)    H/O hiatal hernia    Histoplasmosis 2002   history of, [place] raised   Hypertension    Kidney stone    Kidney stones     Current Outpatient Medications:    albuterol (VENTOLIN HFA) 108 (90 Base) MCG/ACT inhaler, Inhale 2 puffs into the lungs every 6 (six) hours as needed for wheezing or shortness of breath., Disp: 18 g, Rfl: 5   ALPRAZolam (XANAX) 1 MG tablet, Take 1 tablet (1 mg total) by mouth 2 (two) times daily as needed for anxiety., Disp: 20 tablet, Rfl: 0   beclomethasone (QVAR REDIHALER) 40 MCG/ACT inhaler, Inhale 2 puffs into the lungs 2 (two) times daily., Disp: 1 each, Rfl: 3   Blood Glucose Monitoring Suppl (CONTOUR NEXT ONE) KIT, 1 kit by Does not apply route See admin instructions. For checking blood sugar 2 times a day, Disp: 1 kit, Rfl: 0   Cholecalciferol (VITAMIN D3 PO), Take 50 mcg by mouth daily., Disp: , Rfl:    Continuous Glucose Sensor (DEXCOM G7 SENSOR) MISC, Use to check glucose continuously, change sensor every 10 days, Disp: 9 each, Rfl: 3   Continuous Glucose  Transmitter (DEXCOM G6 TRANSMITTER) MISC, Use as instructed to check blood sugar. Change every 90 days., Disp: 1 each, Rfl: 3   ezetimibe (ZETIA) 10 MG tablet, TAKE 1 TABLET(10 MG) BY MOUTH DAILY, Disp: 90 tablet, Rfl: 1   fenofibrate 160 MG tablet, 1 po qd, Disp: 90 tablet, Rfl: 3   fluticasone (FLONASE) 50 MCG/ACT nasal spray, SHAKE LIQUID AND USE 2 SPRAYS IN EACH NOSTRIL DAILY, Disp: 48 g, Rfl: 3   fluticasone (FLOVENT HFA) 110 MCG/ACT inhaler, Inhale 2 puffs into the lungs 2 (two) times daily., Disp: 12 g, Rfl: 1   glucose blood (CONTOUR NEXT TEST) test strip, USE TO CHECK BLOOD SUGAR TWICE DAILY, Disp: 200 strip, Rfl: 5   Insulin Disposable Pump (OMNIPOD 5 G7 PODS, GEN 5,) MISC, 1 each by Does not apply route every other day., Disp: 90 each, Rfl: 3   insulin glargine (LANTUS SOLOSTAR) 100 UNIT/ML Solostar Pen, Inject 30 Units into the skin at bedtime., Disp: 15 mL, Rfl: 1   Insulin Lispro-aabc (LYUMJEV KWIKPEN) 200 UNIT/ML KwikPen, Inject 10-16 units under skin 3x a day as advised, Disp: 9 mL, Rfl: 1   Insulin Lispro-aabc (LYUMJEV) 100 UNIT/ML SOLN, Use up to 80 units daily and insulin pump, Disp: 90 mL, Rfl:  3   Insulin Pen Needle (BD PEN NEEDLE NANO 2ND GEN) 32G X 4 MM MISC, Inject 1 each into the skin 3 (three) times daily. as directed, Disp: 100 each, Rfl: 1   levalbuterol (XOPENEX) 1.25 MG/3ML nebulizer solution, USE ONE VIAL BY NEBULIZATION EVERY 6 HOURS AS NEEDED FOR WHEEZING OR SHORTNESS OF BREATH, Disp: 675 mL, Rfl: 1   losartan (COZAAR) 100 MG tablet, Take 1 tablet (100 mg total) by mouth daily., Disp: 90 tablet, Rfl: 3   magnesium 30 MG tablet, Take by mouth daily., Disp: , Rfl:    Microlet Lancets MISC, TEST BLOOD SUGAR TWICE DAILY, Disp: 200 each, Rfl: 3   montelukast (SINGULAIR) 10 MG tablet, Take 1 tablet (10 mg total) by mouth at bedtime., Disp: 90 tablet, Rfl: 1   NONFORMULARY OR COMPOUNDED ITEM, Cmp, lipid   Dx hyperlipidemia, Disp: 1 each, Rfl: 0   NONFORMULARY OR COMPOUNDED  ITEM, Cmp, lipid--- dx htn and hyperlipidemia Microalbumin---  dx DM II, Disp: 1 each, Rfl: 0   NONFORMULARY OR COMPOUNDED ITEM, Hep C antibody--- need for hep c screen, Disp: 1 each, Rfl: 0   NONFORMULARY OR COMPOUNDED ITEM, Cbcd , cmp, lipid----- dx hyperlipidemia, htn, dm II, Disp: 1 each, Rfl: 0   Omega-3 Fatty Acids (FISH OIL) 1000 MG CAPS, Take by mouth., Disp: , Rfl:    omeprazole (PRILOSEC) 40 MG capsule, TAKE 1 CAPSULE(40 MG) BY MOUTH DAILY, Disp: 90 capsule, Rfl: 3   Tuberculin-Allergy Syringes (B-D ALLERGY SYRINGE 1CC/28G) 28G X 1/2" 1 ML MISC, Use 3x a day as advised, Disp: 50 each, Rfl: 3   vitamin C (ASCORBIC ACID) 500 MG tablet, Take 500 mg by mouth daily., Disp: , Rfl:    OZEMPIC, 1 MG/DOSE, 4 MG/3ML SOPN, INJECT 1 MG UNDER THE SKIN ONCE A WEEK, Disp: 9 mL, Rfl: 0  Social History   Tobacco Use  Smoking Status Never  Smokeless Tobacco Never    Allergies  Allergen Reactions   Jardiance [Empagliflozin] Other (See Comments)    Persistent yeast infection    Metformin And Related Diarrhea   Pravastatin Other (See Comments)    Increased blood glucose?    Objective:  There were no vitals filed for this visit. Body mass index is 28.66 kg/m. Constitutional Well developed. Well nourished.  Vascular Dorsalis pedis pulses palpable bilaterally. Posterior tibial pulses palpable bilaterally. Capillary refill normal to all digits.  No cyanosis or clubbing noted. Pedal hair growth normal.  Neurologic Normal speech. Oriented to person, place, and time. Epicritic sensation to light touch grossly present bilaterally.  Dermatologic Nails well groomed and normal in appearance. No open wounds. No skin lesions.  Orthopedic: Pain on palpation left dorsal midfoot.  Pain along theCourse of the hardware.  Limited range of motion noted at the first metatarsophalangeal joint I am unable to appreciate nonunion. Left ankle instability with pain at the ATFL ligament weakened ATFL ligament  clinically appreciated.  Pain with plantarflexion inversion of the foot.  No pain with dorsiflexion eversion of the foot   Radiographs: 3 views of skeletally mature adult left foot: Multiple hardware is noted.  No hardware failure noted.  Reduction of bunion deformity noted.  Mild possible signs of nonunion noted at the second tarsometatarsal. Assessment:   1. Pain from implanted hardware, subsequent encounter   2. Encounter for preoperative examination for general surgical procedure       Plan:  Patient was evaluated and treated and all questions answered.  Left first versus second tarsometatarsal  joint painful orthopedic hardware-All questions and concerns were discussed with the patient in extensive detail -CT scan was reviewed with the patient which shows that patient has mostly consolidated first and tarsometatarsal fusion.  Patient is having some arthritis of the third tarsometatarsal joint. -At this time clinically she has had no resolve meant of pain.  She is having some neuritis symptoms as well.  At this time clinically I discussed with her to have the hardware removed as to rule out a source of pain.  I discussed this with patient she states understanding and would like to proceed with hardware removal I discussed preoperative intra postop plan with the patient she states understand like to proceed with that -She is a diabetic with last A1c of 6.7  Left ankle instability -Bracing did give her some stability.  However she was not able to wear her brace with all shoes.  She will continue seeing if the bracing will help.  In the future we can make her more custom bracing if needed use right

## 2023-07-05 ENCOUNTER — Other Ambulatory Visit: Payer: Self-pay | Admitting: Internal Medicine

## 2023-07-05 ENCOUNTER — Telehealth: Payer: Self-pay | Admitting: Podiatry

## 2023-07-05 NOTE — Telephone Encounter (Signed)
 DOS-08/07/23  REMOVAL HARDWARE [id number]  [employer] EFFECTIVE DATE-05/17/23  PER THE [employer] PORTAL , PRIOR AUTH IS NOT REQUIRED FOR CPT CODE 40981.  AUTH DECISION ID: [id number]

## 2023-07-14 ENCOUNTER — Encounter: Payer: Self-pay | Admitting: Internal Medicine

## 2023-07-14 ENCOUNTER — Ambulatory Visit: Payer: 59 | Admitting: Internal Medicine

## 2023-07-14 VITALS — BP 120/70 | HR 90 | Ht 63.0 in | Wt 163.0 lb

## 2023-07-14 DIAGNOSIS — Z794 Long term (current) use of insulin: Secondary | ICD-10-CM | POA: Diagnosis not present

## 2023-07-14 DIAGNOSIS — E785 Hyperlipidemia, unspecified: Secondary | ICD-10-CM

## 2023-07-14 DIAGNOSIS — E1165 Type 2 diabetes mellitus with hyperglycemia: Secondary | ICD-10-CM

## 2023-07-14 LAB — POCT GLYCOSYLATED HEMOGLOBIN (HGB A1C): Hemoglobin A1C: 6.8 % — AB (ref 4.0–5.6)

## 2023-07-14 MED ORDER — OZEMPIC (1 MG/DOSE) 4 MG/3ML ~~LOC~~ SOPN
1.0000 mg | PEN_INJECTOR | SUBCUTANEOUS | 3 refills | Status: DC
Start: 2023-07-14 — End: 2023-11-14

## 2023-07-14 NOTE — Addendum Note (Signed)
 Addended by: Pollie Meyer on: 07/14/2023 11:54 AM   Modules accepted: Orders

## 2023-07-14 NOTE — Patient Instructions (Addendum)
 Please continue: - Ozempic 1 mg weekly  Please use the following pump settings: - Basal rates: 12 am: 1.35 >> 1.40 - Insulin to carb ratio: 12 am: 1:1  Snack: 4-6 units Small meal: 8-12 units Regular meal: 14-18 units Large meal: 20 units - Target: 12 am: 110-120 - Correction factor (insulin sensitivity factor):  12 am: 28 - Active insulin time: 4h - Changes infusion site: q2 days  Backup regimen: - Lantus 30 units daily (increase as needed) - Lyumjev 10-16 units 3x a day before meals  YOU ARE CLEARED FOR SURGERY FROM THE DIABETES POINT OF VIEW.  Please come back for a follow-up appointment in 3-4 months.

## 2023-07-14 NOTE — Progress Notes (Signed)
 Patient ID: Dominique [name], female   DOB: 03-09-1961, 63 y.o.   MRN: [id number]   HPI: Dominique [name] is a 63 y.o.-year-old female, returning for follow-up for DM2, dx in 2016, but GDM in 2000, insulin-dependent, uncontrolled, without long term complications.  She previously saw Dr. Everardo All.  Last visit with me 3 months ago.  Interim history: No increased urination, nausea, chest pain.  She does have blurry vision (cataracts).  She has pain in the left foot and is preparing for surgery as she needs to have hardware removed.  Reviewed HbA1c levels: Lab Results  Component Value Date   HGBA1C 6.5 (A) 04/19/2023   HGBA1C 6.6 01/11/2023   HGBA1C 6.7 (A) 09/06/2022   HGBA1C 6.4 (A) 06/13/2022   HGBA1C 7.1 (A) 02/07/2022   HGBA1C 7.1 (A) 10/20/2021   HGBA1C 7.1 (A) 06/17/2021   HGBA1C 6.6 (A) 02/12/2021   HGBA1C 6.7 (A) 10/07/2020   HGBA1C 6.7 (A) 06/04/2020  01/11/2023: HbA1c 6.6% 12/19/2022: HbA1c 6.6% HbA1c 8.9%  At last visit she was on: -  >> stopped 10/2021 - Ozempic 0.5 >> 1 mg weekly  (had nausea with 2 mg dose) - Lantus 10 units in HS - added 06/2021 >> 34 >> 40 >> 48 units in HS - Lyumjev 6-10 units before B and D - added 10/2021 We had to stop Jardiance 07/2018 due to persistent yeast infections. She stopped metformin IR due to lack of effect.  We had to stop metformin ER due to nausea. She developed a rash with Trulicity She was previously on nateglinide and also on repaglinide.  Currently on OmniPod 5 CGM (with Lyumjev): - Basal rates: 12 am: 1.35 - Insulin to carb ratio: 12 am: 1:1 (enter 10-16 g carbs),  but up to 18 units for B and D and down to 8 units for L -but she forgot about this and still answers only after approximately 16 g of carbs - Target: 12 am: 110-120 - Correction factor (insulin sensitivity factor):  12 am: 28 - Active insulin time: 4h - Changes infusion site: q2 days  Total daily dose from basal insulin: 54% (46.6 units) >> 53% (46  units) Total daily dose from bolus insulin: 46% (40.2 units) >> 47% (40 units) Total daily dose 86-120 units daily  Backup regimen: - Lantus 30 units daily (increase as needed) - Lyumjev 10-16 units 3x a day before meals  Pt checks her sugars  >4x a day with her Dexcom CGM:   Previously:  Prev.:   Lowest sugar was 80s >> 76 >> 80S; she has hypoglycemia awareness in the 90s. Highest sugar was 300 >> 200s >> 318 >> 320  Glucometer: Accu-Chek guide  Pt's meals are: - Breakfast: 1-2 egg, 1-2 toast; egg + bacon - Lunch: eating out, salad, meat + veggies - Dinner: eating out, salad, meat + veggies - Dessert: apple pie, cake - Snacks: chips  -No CKD, last BUN/creatinine:  Lab Results  Component Value Date   BUN 11 04/25/2023   BUN 12 03/10/2022   CREATININE 0.80 04/25/2023   CREATININE 0.84 03/10/2022   Lab Results  Component Value Date   MICRALBCREAT 7 04/25/2023   MICRALBCREAT 10 02/14/2019  On losartan 100.  -+ HL; last set of lipids: 12/19/2022: 137/106/45/72 Lab Results  Component Value Date   CHOL 136 10/27/2021   HDL 48 10/27/2021   LDLCALC 69 10/27/2021   TRIG 102 10/27/2021   CHOLHDL 3.1 11/19/2020  On Lipitor 10, fenofibrate 160, fish oil 1000  mg 2x a day.  - last eye exam was in 08/2022: No DR reportedly, + cataracts; Dr. Hyacinth Meeker. Sees an ophthalmic surgeon >> contemplates palpebral surgery. On Xiidra and fish oil for dry eyes.  -No numbness and tingling in her feet.  She saw neurology -no peripheral neuropathy.  Last foot exam 06/30/2023 by Dr. Allena Katz with podiatry.  Off Gabapentin - prev. For Bell's palsy. She has a history of a kidney stone in 2019.  Pt has FH of DM in mother and father.  ROS: + see HPI  I reviewed pt's medications, allergies, PMH, social hx, family hx, and changes were documented in the history of present illness. Otherwise, unchanged from my initial visit note.  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   Hypertension    Kidney stone    Kidney stones    Past Surgical History:  Procedure Laterality Date   FOOT ARTHRODESIS Left 09/05/2019   Procedure: Left Modified McBride, Left First and Second Tarsometatarsal Joint Arthrodesis;  Surgeon: Toni Arthurs, MD;  Location: [facility];  Service: Orthopedics;  Laterality: Left;   laparatomy  1985   RADIAL HEAD ARTHROPLASTY Left 02/23/2013   Procedure: LEFT RADIAL HEAD ARTHROPLASTY qwith ligament reconstruction;  Surgeon: Dominica Severin, MD;  Location: [facility] OR;  Service: Orthopedics;  Laterality: Left;   TONSILLECTOMY AND ADENOIDECTOMY  1967   uterine lining ablation  2002   Social History   Socioeconomic History   Marital status: Married    Spouse name: Not on file   Number of children: Not on file   Years of education: Not on file   Highest education level: 12th grade  Occupational History    Comment: unemployed  Tobacco Use   Smoking status: Never   Smokeless tobacco: Never  Substance and Sexual Activity   Alcohol use: No   Drug use: No   Sexual activity: Yes    Partners: Male  Other Topics Concern   Not on file  Social History Narrative   Exercise-- no   Social Drivers of Health   Financial Resource Strain: Low Risk  (01/19/2023)   Overall Financial Resource Strain (CARDIA)    Difficulty of Paying Living Expenses: Not hard at all  Food Insecurity: No Food Insecurity (01/19/2023)   Hunger Vital Sign    Worried About Running Out of Food in the Last Year: Never true    Ran Out of Food in the Last Year: Never true  Transportation Needs: No Transportation Needs (01/19/2023)   PRAPARE - Administrator, Civil Service (Medical): No    Lack of Transportation (Non-Medical): No  Physical Activity:  Insufficiently Active (01/19/2023)   Exercise Vital Sign    Days of Exercise per Week: 1 day    Minutes of Exercise per Session: 10 min  Stress: No Stress Concern Present (01/19/2023)   [name] of Occupational Health - Occupational Stress Questionnaire    Feeling of Stress : Not at all  Social Connections: Socially Integrated (01/19/2023)   Social Connection and Isolation Panel [NHANES]    Frequency of Communication with Friends and Family: More than three times a week    Frequency of Social  Gatherings with Friends and Family: Once a week    Attends Religious Services: More than 4 times per year    Active Member of Clubs or Organizations: Yes    Attends Engineer, structural: More than 4 times per year    Marital Status: Married  Catering manager Violence: Not on file   Current Outpatient Medications on File Prior to Visit  Medication Sig Dispense Refill   albuterol (VENTOLIN HFA) 108 (90 Base) MCG/ACT inhaler Inhale 2 puffs into the lungs every 6 (six) hours as needed for wheezing or shortness of breath. 18 g 5   ALPRAZolam (XANAX) 1 MG tablet Take 1 tablet (1 mg total) by mouth 2 (two) times daily as needed for anxiety. 20 tablet 0   beclomethasone (QVAR REDIHALER) 40 MCG/ACT inhaler Inhale 2 puffs into the lungs 2 (two) times daily. 1 each 3   Blood Glucose Monitoring Suppl (CONTOUR NEXT ONE) KIT 1 kit by Does not apply route See admin instructions. For checking blood sugar 2 times a day 1 kit 0   Cholecalciferol (VITAMIN D3 PO) Take 50 mcg by mouth daily.     Continuous Glucose Sensor (DEXCOM G7 SENSOR) MISC Use to check glucose continuously, change sensor every 10 days 9 each 3   Continuous Glucose Transmitter (DEXCOM G6 TRANSMITTER) MISC Use as instructed to check blood sugar. Change every 90 days. 1 each 3   ezetimibe (ZETIA) 10 MG tablet TAKE 1 TABLET(10 MG) BY MOUTH DAILY 90 tablet 1   fenofibrate 160 MG tablet 1 po qd 90 tablet 3   fluticasone (FLONASE) 50 MCG/ACT  nasal spray SHAKE LIQUID AND USE 2 SPRAYS IN EACH NOSTRIL DAILY 48 g 3   fluticasone (FLOVENT HFA) 110 MCG/ACT inhaler Inhale 2 puffs into the lungs 2 (two) times daily. 12 g 1   glucose blood (CONTOUR NEXT TEST) test strip USE TO CHECK BLOOD SUGAR TWICE DAILY 200 strip 5   Insulin Disposable Pump (OMNIPOD 5 G7 PODS, GEN 5,) MISC 1 each by Does not apply route every other day. 90 each 3   insulin glargine (LANTUS SOLOSTAR) 100 UNIT/ML Solostar Pen Inject 30 Units into the skin at bedtime. 15 mL 1   Insulin Lispro-aabc (LYUMJEV KWIKPEN) 200 UNIT/ML KwikPen Inject 10-16 units under skin 3x a day as advised 9 mL 1   Insulin Lispro-aabc (LYUMJEV) 100 UNIT/ML SOLN Use up to 80 units daily and insulin pump 90 mL 3   Insulin Pen Needle (BD PEN NEEDLE NANO 2ND GEN) 32G X 4 MM MISC Inject 1 each into the skin 3 (three) times daily. as directed 100 each 1   levalbuterol (XOPENEX) 1.25 MG/3ML nebulizer solution USE ONE VIAL BY NEBULIZATION EVERY 6 HOURS AS NEEDED FOR WHEEZING OR SHORTNESS OF BREATH 675 mL 1   losartan (COZAAR) 100 MG tablet Take 1 tablet (100 mg total) by mouth daily. 90 tablet 3   magnesium 30 MG tablet Take by mouth daily.     Microlet Lancets MISC TEST BLOOD SUGAR TWICE DAILY 200 each 3   montelukast (SINGULAIR) 10 MG tablet Take 1 tablet (10 mg total) by mouth at bedtime. 90 tablet 1   NONFORMULARY OR COMPOUNDED ITEM Cmp, lipid   Dx hyperlipidemia 1 each 0   NONFORMULARY OR COMPOUNDED ITEM Cmp, lipid--- dx htn and hyperlipidemia Microalbumin---  dx DM II 1 each 0   NONFORMULARY OR COMPOUNDED ITEM Hep C antibody--- need for hep c screen 1 each 0   NONFORMULARY OR COMPOUNDED ITEM Cbcd ,  cmp, lipid----- dx hyperlipidemia, htn, dm II 1 each 0   Omega-3 Fatty Acids (FISH OIL) 1000 MG CAPS Take by mouth.     omeprazole (PRILOSEC) 40 MG capsule TAKE 1 CAPSULE(40 MG) BY MOUTH DAILY 90 capsule 3   OZEMPIC, 1 MG/DOSE, 4 MG/3ML SOPN INJECT 1 MG UNDER THE SKIN ONCE A WEEK 9 mL 0    Tuberculin-Allergy Syringes (B-D ALLERGY SYRINGE 1CC/28G) 28G X 1/2" 1 ML MISC Use 3x a day as advised 50 each 3   vitamin C (ASCORBIC ACID) 500 MG tablet Take 500 mg by mouth daily.     No current facility-administered medications on file prior to visit.   Allergies  Allergen Reactions   Jardiance [Empagliflozin] Other (See Comments)    Persistent yeast infection    Metformin And Related Diarrhea   Pravastatin Other (See Comments)    Increased blood glucose?    Family History  Problem Relation Age of Onset   Diabetes Mother    Hyperlipidemia Mother    Hypertension Mother    Diabetes Father    Hyperlipidemia Father    Hypertension Father    Sleep apnea Sister    PE: BP 120/70   Pulse 90   Ht 5\' 3"  (1.6 m)   Wt 163 lb (73.9 kg)   SpO2 97%   BMI 28.87 kg/m  Wt Readings from Last 3 Encounters:  07/14/23 163 lb (73.9 kg)  06/30/23 161 lb 12.8 oz (73.4 kg)  04/19/23 161 lb 12.8 oz (73.4 kg)   Constitutional: normal weight but truncal adipose disposition, in NAD Eyes: EOMI, no exophthalmos ENT: no thyromegaly, no cervical lymphadenopathy Cardiovascular: RRR, No MRG Respiratory: CTA B Musculoskeletal: no deformities Skin: no rashes Neurological: no tremor with outstretched hands  ASSESSMENT: 1. DM2, insulin-dependent, uncontrolled, without long-term complications, but with hyperglycemia  Component     Latest Ref Rng 02/09/2022  Glutamic Acid Decarb Ab     0.0 - 5.0 U/mL <5.0   ZNT8 Antibodies     U/mL <15   Glucose, Plasma     70 - 99 mg/dL 244 (H)   C-Peptide     1.1 - 4.4 ng/mL 4.2    No evidence of pancreatic autoimmunity or insulin deficiency.  2. HL  PLAN:  1. Patient with longstanding, uncontrolled, type 2 diabetes, the OmniPod 5 insulin pump integrated with the Dexcom CGM, with Lyumjev in the pump.  She could not tolerate metformin due to nausea and SGLT2 inhibitors due to yeast infections.  She does tolerate Ozempic well.  At last visit, HbA1c was  slightly better, at 6.5%.  Sugars were fluctuating in the upper half of the target range, with hyperglycemic excursions occasionally after breakfast and more consistently after dinner.  She was injecting Lyumjev correctly, at the beginning of the meals.  We discussed that it appeared that she needed higher doses of Lyumjev before breakfast and dinner but the lower dose with lunch as she was dropping her blood sugars after this meal.  At last visit, upon her questioning, we discussed about ocular changes with Ozempic and that these are usually connected with significant improvement in blood sugars and not specifically to Ozempic.  There were no reports of optic nerve inflammation in patients that were taking Ozempic, but causality was not demonstrated.  Also, she did not have this condition at the last visit with her ophthalmologist. -She is not comfortable making changes into her pump so I usually introduce changes into the pump for her. CGM interpretation: -  At today's visit, we reviewed her CGM downloads: It appears that 68% of values are in target range (goal >70%), while 32% are higher than 180 (goal <25%), and 0% are lower than 70 (goal <4%).  The calculated average blood sugar is 173.  The projected HbA1c for the next 3 months (GMI) is 7.5%. -Reviewing the CGM trends, sugars appear to be fluctuating high in the target range, especially in the last 2 weeks.  They are increasing after breakfast and then again after approximately 3 PM.  They are fairly well-controlled after lunch and dinner.  Upon questioning, she is not adjusting the dose of Lyumjev up as advised and we discussed about how to do so and the fact that the recommended doses are just starting points.  Also, around 3 to 4 PM she is eating an orange and not bolusing for this.  We discussed that she needs to bolus even for a snack.  I gave her new starting dose suggestions for snacks, as well as different meal sizes.  We also increased her basal  rates throughout the day to suggest to the pump that she needs slightly more insulin over the whole day.  Will continue Ozempic for now. - I cleared her for surgery from the diabetes point of view - I suggested to:  Patient Instructions  Please continue: - Ozempic 1 mg weekly  Please use the following pump settings: - Basal rates: 12 am: 1.35 >> 1.40 - Insulin to carb ratio: 12 am: 1:1  Snack: 4-6 units Small meal: 8-12 units Regular meal: 14-18 units Large meal: 20 units - Target: 12 am: 110-120 - Correction factor (insulin sensitivity factor):  12 am: 28 - Active insulin time: 4h - Changes infusion site: q2 days  Backup regimen: - Lantus 30 units daily (increase as needed) - Lyumjev 10-16 units 3x a day before meals  YOU ARE CLEARED FOR SURGERY FROM THE DIABETES POINT OF VIEW.  Please come back for a follow-up appointment in 3-4 months.  - we checked her HbA1c: 6.8% (slightly higher) - advised to check sugars at different times of the day - 4x a day, rotating check times - advised for yearly eye exams >> she is UTD - return to clinic in 3-4 months  2. HL -Lipid fractions were at goal on 12/19/2022: 137/106/45/72 -She is on Lipitor 10 mg daily, fenofibrate 160 mg daily-tolerated well.  She is also on fish oil 1000 mg twice a day but actually using this for dry eyes.  LABS NEED TO GO TO [facility] -patient's husband works there.  Carlus Pavlov, MD PhD [facility]

## 2023-07-27 ENCOUNTER — Other Ambulatory Visit: Payer: Self-pay | Admitting: Internal Medicine

## 2023-08-02 ENCOUNTER — Other Ambulatory Visit: Payer: Self-pay

## 2023-08-02 DIAGNOSIS — Z794 Long term (current) use of insulin: Secondary | ICD-10-CM

## 2023-08-02 MED ORDER — OMNIPOD 5 G7 PODS (GEN 5) MISC: 1.0000 | 3 refills | Status: AC

## 2023-08-07 ENCOUNTER — Other Ambulatory Visit: Payer: Self-pay | Admitting: Podiatry

## 2023-08-07 DIAGNOSIS — Z4889 Encounter for other specified surgical aftercare: Secondary | ICD-10-CM | POA: Diagnosis not present

## 2023-08-07 MED ORDER — OXYCODONE-ACETAMINOPHEN 5-325 MG PO TABS: 1.0000 | ORAL_TABLET | ORAL | 0 refills | Status: DC | PRN

## 2023-08-07 MED ORDER — IBUPROFEN 800 MG PO TABS: 800.0000 mg | ORAL_TABLET | Freq: Four times a day (QID) | ORAL | 1 refills | Status: DC | PRN

## 2023-08-08 ENCOUNTER — Ambulatory Visit (INDEPENDENT_AMBULATORY_CARE_PROVIDER_SITE_OTHER): Admitting: Podiatry

## 2023-08-08 DIAGNOSIS — T85848D Pain due to other internal prosthetic devices, implants and grafts, subsequent encounter: Secondary | ICD-10-CM

## 2023-08-08 DIAGNOSIS — Z9889 Other specified postprocedural states: Secondary | ICD-10-CM

## 2023-08-08 NOTE — Progress Notes (Signed)
 Subjective:  Patient ID: Dominique [name], female    DOB: November 01, 1960,  MRN: [id number]  Chief Complaint  Patient presents with   Post-op Problem    Bleeding through bandage from surgery on yesterday     DOS: 08/07/2023 Procedure: Left hardware removal  63 y.o. female returns for post-op check.  Patient states that she is doing okay.  There is some bleeding through the bandage when I get evaluated.  She thinks this time Dominique [name] came denies any other acute complaints..  She is weightbearing as tolerated in surgical shoe  Review of Systems: Negative except as noted in the HPI. Denies N/V/F/Ch.  Past Medical History:  Diagnosis Date   Arthritis    left foot   Asthma    Bell palsy 2015   Diabetes mellitus without complication (HCC)    type 2   Endometriosis    GERD (gastroesophageal reflux disease)    H/O hiatal hernia    Histoplasmosis 2002   history of, [place] raised   Hypertension    Kidney stone    Kidney stones     Current Outpatient Medications:    albuterol (VENTOLIN HFA) 108 (90 Base) MCG/ACT inhaler, Inhale 2 puffs into the lungs every 6 (six) hours as needed for wheezing or shortness of breath., Disp: 18 g, Rfl: 5   ALPRAZolam (XANAX) 1 MG tablet, Take 1 tablet (1 mg total) by mouth 2 (two) times daily as needed for anxiety., Disp: 20 tablet, Rfl: 0   beclomethasone (QVAR REDIHALER) 40 MCG/ACT inhaler, Inhale 2 puffs into the lungs 2 (two) times daily., Disp: 1 each, Rfl: 3   Blood Glucose Monitoring Suppl (CONTOUR NEXT ONE) KIT, 1 kit by Does not apply route See admin instructions. For checking blood sugar 2 times a day, Disp: 1 kit, Rfl: 0   Cholecalciferol (VITAMIN D3 PO), Take 50 mcg by mouth daily., Disp: , Rfl:    Continuous Glucose Sensor (DEXCOM G7 SENSOR) MISC, Use to check glucose continuously, change sensor every 10 days, Disp: 9 each, Rfl: 3   doxycycline (VIBRA-TABS) 100 MG tablet, Take 1 tablet (100 mg total) by mouth 2 (two) times daily., Disp: 20 tablet,  Rfl: 0   ezetimibe (ZETIA) 10 MG tablet, TAKE 1 TABLET(10 MG) BY MOUTH DAILY, Disp: 90 tablet, Rfl: 1   fenofibrate 160 MG tablet, 1 po qd, Disp: 90 tablet, Rfl: 3   fluticasone (FLONASE) 50 MCG/ACT nasal spray, SHAKE LIQUID AND USE 2 SPRAYS IN EACH NOSTRIL DAILY, Disp: 48 g, Rfl: 3   fluticasone (FLOVENT HFA) 110 MCG/ACT inhaler, Inhale 2 puffs into the lungs 2 (two) times daily., Disp: 12 g, Rfl: 1   glucose blood (CONTOUR NEXT TEST) test strip, USE TO CHECK BLOOD SUGAR TWICE DAILY, Disp: 200 strip, Rfl: 5   ibuprofen (ADVIL) 800 MG tablet, Take 1 tablet (800 mg total) by mouth every 6 (six) hours as needed., Disp: 60 tablet, Rfl: 1   Insulin Disposable Pump (OMNIPOD 5 G7 PODS, GEN 5,) MISC, 1 each by Does not apply route every other day., Disp: 90 each, Rfl: 3   insulin glargine (LANTUS SOLOSTAR) 100 UNIT/ML Solostar Pen, Inject 30 Units into the skin at bedtime., Disp: 15 mL, Rfl: 1   Insulin Lispro-aabc (LYUMJEV KWIKPEN) 200 UNIT/ML KwikPen, Inject 10-16 units under skin 3x a day as advised, Disp: 9 mL, Rfl: 1   Insulin Lispro-aabc (LYUMJEV) 100 UNIT/ML SOLN, Use up to 80 units daily and insulin pump, Disp: 90 mL, Rfl: 3   Insulin  Pen Needle (BD PEN NEEDLE NANO 2ND GEN) 32G X 4 MM MISC, Inject 1 each into the skin 3 (three) times daily. as directed, Disp: 100 each, Rfl: 1   levalbuterol (XOPENEX) 1.25 MG/3ML nebulizer solution, USE ONE VIAL BY NEBULIZATION EVERY 6 HOURS AS NEEDED FOR WHEEZING OR SHORTNESS OF BREATH, Disp: 675 mL, Rfl: 1   losartan (COZAAR) 100 MG tablet, Take 1 tablet (100 mg total) by mouth daily., Disp: 90 tablet, Rfl: 3   magnesium 30 MG tablet, Take by mouth daily., Disp: , Rfl:    Microlet Lancets MISC, TEST BLOOD SUGAR TWICE DAILY, Disp: 200 each, Rfl: 3   montelukast (SINGULAIR) 10 MG tablet, Take 1 tablet (10 mg total) by mouth at bedtime., Disp: 90 tablet, Rfl: 1   NONFORMULARY OR COMPOUNDED ITEM, Cmp, lipid   Dx hyperlipidemia, Disp: 1 each, Rfl: 0   NONFORMULARY OR  COMPOUNDED ITEM, Cmp, lipid--- dx htn and hyperlipidemia Microalbumin---  dx DM II, Disp: 1 each, Rfl: 0   NONFORMULARY OR COMPOUNDED ITEM, Hep C antibody--- need for hep c screen, Disp: 1 each, Rfl: 0   NONFORMULARY OR COMPOUNDED ITEM, Cbcd , cmp, lipid----- dx hyperlipidemia, htn, dm II, Disp: 1 each, Rfl: 0   Omega-3 Fatty Acids (FISH OIL) 1000 MG CAPS, Take by mouth., Disp: , Rfl:    omeprazole (PRILOSEC) 40 MG capsule, TAKE 1 CAPSULE(40 MG) BY MOUTH DAILY, Disp: 90 capsule, Rfl: 3   oxyCODONE-acetaminophen (PERCOCET) 5-325 MG tablet, Take 1 tablet by mouth every 4 (four) hours as needed for severe pain (pain score 7-10)., Disp: 30 tablet, Rfl: 0   Semaglutide, 1 MG/DOSE, (OZEMPIC, 1 MG/DOSE,) 4 MG/3ML SOPN, Inject 1 mg into the skin once a week., Disp: 9 mL, Rfl: 3   Tuberculin-Allergy Syringes (B-D ALLERGY SYRINGE 1CC/28G) 28G X 1/2" 1 ML MISC, Use 3x a day as advised, Disp: 50 each, Rfl: 3   vitamin C (ASCORBIC ACID) 500 MG tablet, Take 500 mg by mouth daily., Disp: , Rfl:   Social History   Tobacco Use  Smoking Status Never  Smokeless Tobacco Never    Allergies  Allergen Reactions   Jardiance [Empagliflozin] Other (See Comments)    Persistent yeast infection    Metformin And Related Diarrhea   Pravastatin Other (See Comments)    Increased blood glucose?    Objective:  There were no vitals filed for this visit. There is no height or weight on file to calculate BMI. Constitutional Well developed. Well nourished.  Vascular Foot warm and well perfused. Capillary refill normal to all digits.   Neurologic Normal speech. Oriented to person, place, and time. Epicritic sensation to light touch grossly present bilaterally.  Dermatologic Skin healing well without signs of infection. Skin edges well coapted without signs of infection.  Orthopedic: Tenderness to palpation noted about the surgical site.   Radiographs: 3 views of skeletally mature left foot: No further hardware noted  no abnormalities noted.  All hardware removed Assessment:  No diagnosis found. Plan:  Patient was evaluated and treated and all questions answered.  S/p foot surgery left -Progressing as expected post-operatively. -XR: See above -WB Status: Weightbearing as tolerated in surgical shoe -Sutures: Intact.  Distal stitch/staple removed.  No active bleeding noted. -Medications: None -Foot redressed with Surgicel 4 x 4 gauze Curlex ABD pad Ace bandage.  No follow-ups on file.

## 2023-08-10 ENCOUNTER — Ambulatory Visit (INDEPENDENT_AMBULATORY_CARE_PROVIDER_SITE_OTHER): Admitting: Podiatry

## 2023-08-10 ENCOUNTER — Telehealth: Payer: Self-pay | Admitting: Podiatry

## 2023-08-10 DIAGNOSIS — Z9889 Other specified postprocedural states: Secondary | ICD-10-CM

## 2023-08-10 DIAGNOSIS — T85848D Pain due to other internal prosthetic devices, implants and grafts, subsequent encounter: Secondary | ICD-10-CM

## 2023-08-10 MED ORDER — DOXYCYCLINE HYCLATE 100 MG PO TABS: 100.0000 mg | ORAL_TABLET | Freq: Two times a day (BID) | ORAL | 0 refills | Status: DC

## 2023-08-10 MED ORDER — FLUCONAZOLE 150 MG PO TABS: 150.0000 mg | ORAL_TABLET | Freq: Once | ORAL | 0 refills | Status: AC

## 2023-08-10 NOTE — Telephone Encounter (Signed)
 Pt left message stating she had surgery on Monday for hardware removal has bleed thru bandage again. She came in on 3/25 to have it changed. She is asking if she could buy the stuff and change the bandage herself. Please advise

## 2023-08-10 NOTE — Progress Notes (Signed)
 2 stitches were implanted no acute complaints.  No active bleeding noted.  Doxycycline was dispensed for skin and soft tissue prophylaxis.

## 2023-08-16 ENCOUNTER — Ambulatory Visit (INDEPENDENT_AMBULATORY_CARE_PROVIDER_SITE_OTHER)

## 2023-08-16 ENCOUNTER — Telehealth: Payer: Self-pay

## 2023-08-16 ENCOUNTER — Ambulatory Visit (INDEPENDENT_AMBULATORY_CARE_PROVIDER_SITE_OTHER): Payer: 59 | Admitting: Podiatry

## 2023-08-16 DIAGNOSIS — T85848D Pain due to other internal prosthetic devices, implants and grafts, subsequent encounter: Secondary | ICD-10-CM | POA: Diagnosis not present

## 2023-08-16 DIAGNOSIS — Z9889 Other specified postprocedural states: Secondary | ICD-10-CM

## 2023-08-16 NOTE — Telephone Encounter (Signed)
 Patient's husband called -they want to know if she can wash her foot now -please advise -thanks

## 2023-08-16 NOTE — Progress Notes (Signed)
 Subjective:  Patient ID: Dominique [name], female    DOB: 1960/12/12,  MRN: [id number]  Chief Complaint  Patient presents with   Routine Post Op    DOS 08/07/23 --- LEFT FOOT HARDWARE REMOVAL Pt stated that she has some pain she is concerned about the swelling and redness     DOS: 08/07/2023 Procedure: Left hardware removal  63 y.o. female returns for post-op check.  Patient states that she is doing okay.  There is some bleeding through the bandage when I get evaluated.  She thinks this time Dominique [name] came denies any other acute complaints..  She is weightbearing as tolerated in surgical shoe  Review of Systems: Negative except as noted in the HPI. Denies N/V/F/Ch.  Past Medical History:  Diagnosis Date   Arthritis    left foot   Asthma    Bell palsy 2015   Diabetes mellitus without complication (HCC)    type 2   Endometriosis    GERD (gastroesophageal reflux disease)    H/O hiatal hernia    Histoplasmosis 2002   history of, [place] raised   Hypertension    Kidney stone    Kidney stones     Current Outpatient Medications:    albuterol (VENTOLIN HFA) 108 (90 Base) MCG/ACT inhaler, Inhale 2 puffs into the lungs every 6 (six) hours as needed for wheezing or shortness of breath., Disp: 18 g, Rfl: 5   ALPRAZolam (XANAX) 1 MG tablet, Take 1 tablet (1 mg total) by mouth 2 (two) times daily as needed for anxiety., Disp: 20 tablet, Rfl: 0   beclomethasone (QVAR REDIHALER) 40 MCG/ACT inhaler, Inhale 2 puffs into the lungs 2 (two) times daily., Disp: 1 each, Rfl: 3   Blood Glucose Monitoring Suppl (CONTOUR NEXT ONE) KIT, 1 kit by Does not apply route See admin instructions. For checking blood sugar 2 times a day, Disp: 1 kit, Rfl: 0   Cholecalciferol (VITAMIN D3 PO), Take 50 mcg by mouth daily., Disp: , Rfl:    Continuous Glucose Sensor (DEXCOM G7 SENSOR) MISC, Use to check glucose continuously, change sensor every 10 days, Disp: 9 each, Rfl: 3   doxycycline (VIBRA-TABS) 100 MG tablet,  Take 1 tablet (100 mg total) by mouth 2 (two) times daily., Disp: 20 tablet, Rfl: 0   ezetimibe (ZETIA) 10 MG tablet, TAKE 1 TABLET(10 MG) BY MOUTH DAILY, Disp: 90 tablet, Rfl: 1   fenofibrate 160 MG tablet, 1 po qd, Disp: 90 tablet, Rfl: 3   fluticasone (FLONASE) 50 MCG/ACT nasal spray, SHAKE LIQUID AND USE 2 SPRAYS IN EACH NOSTRIL DAILY, Disp: 48 g, Rfl: 3   fluticasone (FLOVENT HFA) 110 MCG/ACT inhaler, Inhale 2 puffs into the lungs 2 (two) times daily., Disp: 12 g, Rfl: 1   glucose blood (CONTOUR NEXT TEST) test strip, USE TO CHECK BLOOD SUGAR TWICE DAILY, Disp: 200 strip, Rfl: 5   ibuprofen (ADVIL) 800 MG tablet, Take 1 tablet (800 mg total) by mouth every 6 (six) hours as needed., Disp: 60 tablet, Rfl: 1   Insulin Disposable Pump (OMNIPOD 5 G7 PODS, GEN 5,) MISC, 1 each by Does not apply route every other day., Disp: 90 each, Rfl: 3   insulin glargine (LANTUS SOLOSTAR) 100 UNIT/ML Solostar Pen, Inject 30 Units into the skin at bedtime., Disp: 15 mL, Rfl: 1   Insulin Lispro-aabc (LYUMJEV KWIKPEN) 200 UNIT/ML KwikPen, Inject 10-16 units under skin 3x a day as advised, Disp: 9 mL, Rfl: 1   Insulin Lispro-aabc (LYUMJEV) 100 UNIT/ML SOLN, Use  up to 80 units daily and insulin pump, Disp: 90 mL, Rfl: 3   Insulin Pen Needle (BD PEN NEEDLE NANO 2ND GEN) 32G X 4 MM MISC, Inject 1 each into the skin 3 (three) times daily. as directed, Disp: 100 each, Rfl: 1   levalbuterol (XOPENEX) 1.25 MG/3ML nebulizer solution, USE ONE VIAL BY NEBULIZATION EVERY 6 HOURS AS NEEDED FOR WHEEZING OR SHORTNESS OF BREATH, Disp: 675 mL, Rfl: 1   losartan (COZAAR) 100 MG tablet, Take 1 tablet (100 mg total) by mouth daily., Disp: 90 tablet, Rfl: 3   magnesium 30 MG tablet, Take by mouth daily., Disp: , Rfl:    Microlet Lancets MISC, TEST BLOOD SUGAR TWICE DAILY, Disp: 200 each, Rfl: 3   montelukast (SINGULAIR) 10 MG tablet, Take 1 tablet (10 mg total) by mouth at bedtime., Disp: 90 tablet, Rfl: 1   NONFORMULARY OR COMPOUNDED  ITEM, Cmp, lipid   Dx hyperlipidemia, Disp: 1 each, Rfl: 0   NONFORMULARY OR COMPOUNDED ITEM, Cmp, lipid--- dx htn and hyperlipidemia Microalbumin---  dx DM II, Disp: 1 each, Rfl: 0   NONFORMULARY OR COMPOUNDED ITEM, Hep C antibody--- need for hep c screen, Disp: 1 each, Rfl: 0   NONFORMULARY OR COMPOUNDED ITEM, Cbcd , cmp, lipid----- dx hyperlipidemia, htn, dm II, Disp: 1 each, Rfl: 0   Omega-3 Fatty Acids (FISH OIL) 1000 MG CAPS, Take by mouth., Disp: , Rfl:    omeprazole (PRILOSEC) 40 MG capsule, TAKE 1 CAPSULE(40 MG) BY MOUTH DAILY, Disp: 90 capsule, Rfl: 3   oxyCODONE-acetaminophen (PERCOCET) 5-325 MG tablet, Take 1 tablet by mouth every 4 (four) hours as needed for severe pain (pain score 7-10)., Disp: 30 tablet, Rfl: 0   Semaglutide, 1 MG/DOSE, (OZEMPIC, 1 MG/DOSE,) 4 MG/3ML SOPN, Inject 1 mg into the skin once a week., Disp: 9 mL, Rfl: 3   Tuberculin-Allergy Syringes (B-D ALLERGY SYRINGE 1CC/28G) 28G X 1/2" 1 ML MISC, Use 3x a day as advised, Disp: 50 each, Rfl: 3   vitamin C (ASCORBIC ACID) 500 MG tablet, Take 500 mg by mouth daily., Disp: , Rfl:   Social History   Tobacco Use  Smoking Status Never  Smokeless Tobacco Never    Allergies  Allergen Reactions   Jardiance [Empagliflozin] Other (See Comments)    Persistent yeast infection    Metformin And Related Diarrhea   Pravastatin Other (See Comments)    Increased blood glucose?    Objective:  There were no vitals filed for this visit. There is no height or weight on file to calculate BMI. Constitutional Well developed. Well nourished.  Vascular Foot warm and well perfused. Capillary refill normal to all digits.   Neurologic Normal speech. Oriented to person, place, and time. Epicritic sensation to light touch grossly present bilaterally.  Dermatologic Skin healing well without signs of infection. Skin edges well coapted without signs of infection.  Orthopedic: Tenderness to palpation noted about the surgical site.    Radiographs: 3 views of skeletally mature left foot: No further hardware noted no abnormalities noted.  All hardware removed Assessment:   1. Pain from implanted hardware, subsequent encounter    Plan:  Patient was evaluated and treated and all questions answered.  S/p foot surgery left -Progressing as expected post-operatively. -XR: See above -WB Status: Weightbearing as tolerated in surgical shoe -Sutures: Intact.  Distal stitch/staple removed.  No active bleeding noted. -Medications: None -I will see her back again patient in 2 weeks to remove the staples.  She states understanding  No follow-ups on file.

## 2023-08-18 ENCOUNTER — Ambulatory Visit: Payer: 59 | Admitting: Internal Medicine

## 2023-08-30 ENCOUNTER — Encounter: Payer: Self-pay | Admitting: Family Medicine

## 2023-08-30 ENCOUNTER — Ambulatory Visit: Payer: 59 | Admitting: Podiatry

## 2023-08-30 DIAGNOSIS — J302 Other seasonal allergic rhinitis: Secondary | ICD-10-CM

## 2023-08-30 DIAGNOSIS — T85848D Pain due to other internal prosthetic devices, implants and grafts, subsequent encounter: Secondary | ICD-10-CM

## 2023-08-30 DIAGNOSIS — Z9889 Other specified postprocedural states: Secondary | ICD-10-CM

## 2023-08-30 MED ORDER — MONTELUKAST SODIUM 10 MG PO TABS: 10.0000 mg | ORAL_TABLET | Freq: Every day | ORAL | 1 refills | Status: DC

## 2023-08-30 NOTE — Progress Notes (Signed)
 Subjective:  Patient ID: Dominique [name], female    DOB: December 19, 1960,  MRN: [id number]  Chief Complaint  Patient presents with   Routine Post Op    DOS 08/07/23 --- LEFT FOOT HARDWARE REMOVAL    DOS: 08/07/2023 Procedure: Left hardware removal  63 y.o. female returns for post-op check.  Patient states that she is doing okay.  There is some bleeding through the bandage when I get evaluated.  She thinks this time [name] came denies any other acute complaints..  She is weightbearing as tolerated in surgical shoe  Review of Systems: Negative except as noted in the HPI. Denies N/V/F/Ch.  Past Medical History:  Diagnosis Date   Arthritis    left foot   Asthma    Bell palsy 2015   Diabetes mellitus without complication (HCC)    type 2   Endometriosis    GERD (gastroesophageal reflux disease)    H/O hiatal hernia    Histoplasmosis 2002   history of, [place] raised   Hypertension    Kidney stone    Kidney stones     Current Outpatient Medications:    albuterol (VENTOLIN HFA) 108 (90 Base) MCG/ACT inhaler, Inhale 2 puffs into the lungs every 6 (six) hours as needed for wheezing or shortness of breath., Disp: 18 g, Rfl: 5   ALPRAZolam (XANAX) 1 MG tablet, Take 1 tablet (1 mg total) by mouth 2 (two) times daily as needed for anxiety., Disp: 20 tablet, Rfl: 0   beclomethasone (QVAR REDIHALER) 40 MCG/ACT inhaler, Inhale 2 puffs into the lungs 2 (two) times daily., Disp: 1 each, Rfl: 3   Blood Glucose Monitoring Suppl (CONTOUR NEXT ONE) KIT, 1 kit by Does not apply route See admin instructions. For checking blood sugar 2 times a day, Disp: 1 kit, Rfl: 0   Cholecalciferol (VITAMIN D3 PO), Take 50 mcg by mouth daily., Disp: , Rfl:    Continuous Glucose Sensor (DEXCOM G7 SENSOR) MISC, Use to check glucose continuously, change sensor every 10 days, Disp: 9 each, Rfl: 3   doxycycline (VIBRA-TABS) 100 MG tablet, Take 1 tablet (100 mg total) by mouth 2 (two) times daily., Disp: 20 tablet, Rfl: 0    ezetimibe (ZETIA) 10 MG tablet, TAKE 1 TABLET(10 MG) BY MOUTH DAILY, Disp: 90 tablet, Rfl: 1   fenofibrate 160 MG tablet, 1 po qd, Disp: 90 tablet, Rfl: 3   fluticasone (FLONASE) 50 MCG/ACT nasal spray, SHAKE LIQUID AND USE 2 SPRAYS IN EACH NOSTRIL DAILY, Disp: 48 g, Rfl: 3   fluticasone (FLOVENT HFA) 110 MCG/ACT inhaler, Inhale 2 puffs into the lungs 2 (two) times daily., Disp: 12 g, Rfl: 1   glucose blood (CONTOUR NEXT TEST) test strip, USE TO CHECK BLOOD SUGAR TWICE DAILY, Disp: 200 strip, Rfl: 5   ibuprofen (ADVIL) 800 MG tablet, Take 1 tablet (800 mg total) by mouth every 6 (six) hours as needed., Disp: 60 tablet, Rfl: 1   Insulin Disposable Pump (OMNIPOD 5 G7 PODS, GEN 5,) MISC, 1 each by Does not apply route every other day., Disp: 90 each, Rfl: 3   insulin glargine (LANTUS SOLOSTAR) 100 UNIT/ML Solostar Pen, Inject 30 Units into the skin at bedtime., Disp: 15 mL, Rfl: 1   Insulin Lispro-aabc (LYUMJEV KWIKPEN) 200 UNIT/ML KwikPen, Inject 10-16 units under skin 3x a day as advised, Disp: 9 mL, Rfl: 1   Insulin Lispro-aabc (LYUMJEV) 100 UNIT/ML SOLN, Use up to 80 units daily and insulin pump, Disp: 90 mL, Rfl: 3   Insulin  Pen Needle (BD PEN NEEDLE NANO 2ND GEN) 32G X 4 MM MISC, Inject 1 each into the skin 3 (three) times daily. as directed, Disp: 100 each, Rfl: 1   levalbuterol (XOPENEX) 1.25 MG/3ML nebulizer solution, USE ONE VIAL BY NEBULIZATION EVERY 6 HOURS AS NEEDED FOR WHEEZING OR SHORTNESS OF BREATH, Disp: 675 mL, Rfl: 1   losartan (COZAAR) 100 MG tablet, Take 1 tablet (100 mg total) by mouth daily., Disp: 90 tablet, Rfl: 3   magnesium 30 MG tablet, Take by mouth daily., Disp: , Rfl:    Microlet Lancets MISC, TEST BLOOD SUGAR TWICE DAILY, Disp: 200 each, Rfl: 3   montelukast (SINGULAIR) 10 MG tablet, Take 1 tablet (10 mg total) by mouth at bedtime., Disp: 90 tablet, Rfl: 1   NONFORMULARY OR COMPOUNDED ITEM, Cmp, lipid   Dx hyperlipidemia, Disp: 1 each, Rfl: 0   NONFORMULARY OR  COMPOUNDED ITEM, Cmp, lipid--- dx htn and hyperlipidemia Microalbumin---  dx DM II, Disp: 1 each, Rfl: 0   NONFORMULARY OR COMPOUNDED ITEM, Hep C antibody--- need for hep c screen, Disp: 1 each, Rfl: 0   NONFORMULARY OR COMPOUNDED ITEM, Cbcd , cmp, lipid----- dx hyperlipidemia, htn, dm II, Disp: 1 each, Rfl: 0   Omega-3 Fatty Acids (FISH OIL) 1000 MG CAPS, Take by mouth., Disp: , Rfl:    omeprazole (PRILOSEC) 40 MG capsule, TAKE 1 CAPSULE(40 MG) BY MOUTH DAILY, Disp: 90 capsule, Rfl: 3   oxyCODONE-acetaminophen (PERCOCET) 5-325 MG tablet, Take 1 tablet by mouth every 4 (four) hours as needed for severe pain (pain score 7-10)., Disp: 30 tablet, Rfl: 0   Semaglutide, 1 MG/DOSE, (OZEMPIC, 1 MG/DOSE,) 4 MG/3ML SOPN, Inject 1 mg into the skin once a week., Disp: 9 mL, Rfl: 3   Tuberculin-Allergy Syringes (B-D ALLERGY SYRINGE 1CC/28G) 28G X 1/2" 1 ML MISC, Use 3x a day as advised, Disp: 50 each, Rfl: 3   vitamin C (ASCORBIC ACID) 500 MG tablet, Take 500 mg by mouth daily., Disp: , Rfl:   Social History   Tobacco Use  Smoking Status Never  Smokeless Tobacco Never    Allergies  Allergen Reactions   Jardiance [Empagliflozin] Other (See Comments)    Persistent yeast infection    Metformin And Related Diarrhea   Pravastatin Other (See Comments)    Increased blood glucose?    Objective:  There were no vitals filed for this visit. There is no height or weight on file to calculate BMI. Constitutional Well developed. Well nourished.  Vascular Foot warm and well perfused. Capillary refill normal to all digits.   Neurologic Normal speech. Oriented to person, place, and time. Epicritic sensation to light touch grossly present bilaterally.  Dermatologic skin completely re-epithelialized no signs of deviations noted no complications noted.  Orthopedic: Tenderness to palpation noted about the surgical site.   Radiographs: 3 views of skeletally mature left foot: No further hardware noted no  abnormalities noted.  All hardware removed Assessment:   No diagnosis found.  Plan:  Patient was evaluated and treated and all questions answered.  S/p foot surgery left -Progressing as expected post-operatively. -XR: See above -WB Status: Weightbearing as tolerated in surgical shoe -Sutures: removed distal stitch/staple removed.  No active bleeding noted. -Medications: None -I will see her back in final follow-up for three weeks  No follow-ups on file.

## 2023-09-05 ENCOUNTER — Other Ambulatory Visit: Payer: Self-pay

## 2023-09-05 DIAGNOSIS — Z794 Long term (current) use of insulin: Secondary | ICD-10-CM

## 2023-09-05 MED ORDER — CONTOUR NEXT TEST VI STRP: ORAL_STRIP | 5 refills | Status: AC

## 2023-09-06 LAB — HM DIABETES EYE EXAM

## 2023-09-14 ENCOUNTER — Encounter: Payer: Self-pay | Admitting: Podiatry

## 2023-09-18 ENCOUNTER — Encounter: Payer: Self-pay | Admitting: Podiatry

## 2023-09-18 ENCOUNTER — Other Ambulatory Visit: Payer: Self-pay | Admitting: Podiatry

## 2023-09-18 DIAGNOSIS — T85848D Pain due to other internal prosthetic devices, implants and grafts, subsequent encounter: Secondary | ICD-10-CM

## 2023-09-18 DIAGNOSIS — R200 Anesthesia of skin: Secondary | ICD-10-CM

## 2023-09-20 ENCOUNTER — Ambulatory Visit: Admitting: Podiatry

## 2023-09-20 ENCOUNTER — Ambulatory Visit (INDEPENDENT_AMBULATORY_CARE_PROVIDER_SITE_OTHER)

## 2023-09-20 DIAGNOSIS — T85848D Pain due to other internal prosthetic devices, implants and grafts, subsequent encounter: Secondary | ICD-10-CM

## 2023-09-20 DIAGNOSIS — M79672 Pain in left foot: Secondary | ICD-10-CM

## 2023-09-20 DIAGNOSIS — Z9889 Other specified postprocedural states: Secondary | ICD-10-CM

## 2023-09-20 NOTE — Progress Notes (Signed)
 Subjective:  Patient ID: Dominique [name], female    DOB: [birth year],  MRN: [id number]  Chief Complaint  Patient presents with   Routine Post Op    DOS 08/07/23 --- LEFT FOOT HARDWARE REMOVAL    DOS: 08/07/2023 Procedure: Left hardware removal  63 y.o. female returns for post-op check.  Patient states that she is doing okay.  No acute complaints.  Healing well from the surgical site.  Some neuritis symptoms noted.  Review of Systems: Negative except as noted in the HPI. Denies N/V/F/Ch.  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   Hypertension    Kidney stone    Kidney stones     Current Outpatient Medications:    albuterol  (VENTOLIN  HFA) 108 (90 Base) MCG/ACT inhaler, Inhale 2 puffs into the lungs every 6 (six) hours as needed for wheezing or shortness of breath., Disp: 18 g, Rfl: 5   ALPRAZolam  (XANAX ) 1 MG tablet, Take 1 tablet (1 mg total) by mouth 2 (two) times daily as needed for anxiety., Disp: 20 tablet, Rfl: 0   beclomethasone (QVAR  REDIHALER) 40 MCG/ACT inhaler, Inhale 2 puffs into the lungs 2 (two) times daily., Disp: 1 each, Rfl: 3   Blood Glucose Monitoring Suppl (CONTOUR NEXT ONE) KIT, 1 kit by Does not apply route See admin instructions. For checking blood sugar 2 times a day, Disp: 1 kit, Rfl: 0   Cholecalciferol  (VITAMIN D3 PO), Take 50 mcg by mouth daily., Disp: , Rfl:    Continuous Glucose Sensor (DEXCOM G7 SENSOR) MISC, Use to check glucose continuously, change sensor every 10 days, Disp: 9 each, Rfl: 3   doxycycline  (VIBRA -TABS) 100 MG tablet, Take 1 tablet (100 mg total) by mouth 2 (two) times daily., Disp: 20 tablet, Rfl: 0   ezetimibe  (ZETIA ) 10 MG tablet, TAKE 1 TABLET(10 MG) BY MOUTH DAILY, Disp: 90 tablet, Rfl:  1   fenofibrate  160 MG tablet, 1 po qd, Disp: 90 tablet, Rfl: 3   fluticasone  (FLONASE ) 50 MCG/ACT nasal spray, SHAKE LIQUID AND USE 2 SPRAYS IN EACH NOSTRIL DAILY, Disp: 48 g, Rfl: 3   fluticasone  (FLOVENT  HFA) 110 MCG/ACT inhaler, Inhale 2 puffs into the lungs 2 (two) times daily., Disp: 12 g, Rfl: 1   glucose blood (CONTOUR NEXT TEST) test strip, USE TO CHECK BLOOD SUGAR TWICE DAILY, Disp: 200 strip, Rfl: 5   ibuprofen  (ADVIL ) 800 MG tablet, Take 1 tablet (800 mg total) by mouth every 6 (six) hours as needed., Disp: 60 tablet, Rfl: 1   Insulin  Disposable Pump (OMNIPOD 5 G7 PODS, GEN 5,) MISC, 1 each by Does not apply route every other day., Disp: 90 each, Rfl: 3   insulin  glargine (LANTUS  SOLOSTAR) 100 UNIT/ML Solostar Pen, Inject 30 Units into the skin at bedtime., Disp: 15 mL, Rfl: 1   Insulin  Lispro-aabc (LYUMJEV  KWIKPEN) 200 UNIT/ML KwikPen, Inject 10-16 units under skin 3x a day as advised, Disp: 9 mL, Rfl: 1   Insulin  Lispro-aabc (LYUMJEV ) 100 UNIT/ML SOLN, Use up to 80 units daily and insulin  pump, Disp: 90 mL, Rfl: 3   Insulin  Pen Needle (BD PEN NEEDLE NANO 2ND GEN) 32G X 4 MM MISC, Inject 1 each into  the skin 3 (three) times daily. as directed, Disp: 100 each, Rfl: 1   levalbuterol  (XOPENEX ) 1.25 MG/3ML nebulizer solution, USE ONE VIAL BY NEBULIZATION EVERY 6 HOURS AS NEEDED FOR WHEEZING OR SHORTNESS OF BREATH, Disp: 675 mL, Rfl: 1   losartan  (COZAAR ) 100 MG tablet, Take 1 tablet (100 mg total) by mouth daily., Disp: 90 tablet, Rfl: 3   magnesium 30 MG tablet, Take by mouth daily., Disp: , Rfl:    Microlet Lancets MISC, TEST BLOOD SUGAR TWICE DAILY, Disp: 200 each, Rfl: 3   montelukast  (SINGULAIR ) 10 MG tablet, Take 1 tablet (10 mg total) by mouth at bedtime., Disp: 90 tablet, Rfl: 1   NONFORMULARY OR COMPOUNDED ITEM, Cmp, lipid   Dx hyperlipidemia, Disp: 1 each, Rfl: 0   NONFORMULARY OR COMPOUNDED ITEM, Cmp, lipid--- dx htn and hyperlipidemia Microalbumin---  dx DM II, Disp: 1 each,  Rfl: 0   NONFORMULARY OR COMPOUNDED ITEM, Hep C antibody--- need for hep c screen, Disp: 1 each, Rfl: 0   NONFORMULARY OR COMPOUNDED ITEM, Cbcd , cmp, lipid----- dx hyperlipidemia, htn, dm II, Disp: 1 each, Rfl: 0   Omega-3 Fatty Acids (FISH OIL) 1000 MG CAPS, Take by mouth., Disp: , Rfl:    omeprazole  (PRILOSEC) 40 MG capsule, TAKE 1 CAPSULE(40 MG) BY MOUTH DAILY, Disp: 90 capsule, Rfl: 3   oxyCODONE -acetaminophen  (PERCOCET) 5-325 MG tablet, Take 1 tablet by mouth every 4 (four) hours as needed for severe pain (pain score 7-10)., Disp: 30 tablet, Rfl: 0   Semaglutide , 1 MG/DOSE, (OZEMPIC , 1 MG/DOSE,) 4 MG/3ML SOPN, Inject 1 mg into the skin once a week., Disp: 9 mL, Rfl: 3   Tuberculin-Allergy  Syringes (B-D ALLERGY  SYRINGE 1CC/28G) 28G X 1/2" 1 ML MISC, Use 3x a day as advised, Disp: 50 each, Rfl: 3   vitamin C  (ASCORBIC ACID ) 500 MG tablet, Take 500 mg by mouth daily., Disp: , Rfl:   Social History   Tobacco Use  Smoking Status Never  Smokeless Tobacco Never    Allergies  Allergen Reactions   Jardiance  [Empagliflozin ] Other (See Comments)    Persistent yeast infection    Metformin  And Related Diarrhea   Pravastatin  Other (See Comments)    Increased blood glucose?    Objective:  There were no vitals filed for this visit. There is no height or weight on file to calculate BMI. Constitutional Well developed. Well nourished.  Vascular Foot warm and well perfused. Capillary refill normal to all digits.   Neurologic Normal speech. Oriented to person, place, and time. Epicritic sensation to light touch grossly present bilaterally.  Dermatologic skin completely re-epithelialized no signs of deviations noted no complications noted.  Some neuritis symptoms noted.  Orthopedic: No further tenderness to palpation noted about the surgical site.   Radiographs: 3 views of skeletally mature left foot: No further hardware noted no abnormalities noted.  All hardware removed Assessment:   1.  Pain from implanted hardware, subsequent encounter     Plan:  Patient was evaluated and treated and all questions answered.  S/p foot surgery left - Clinically healing well progressing well.  She is having some neuritis symptoms.  She has obtained TENS unit which seems to be helping.  Will continue using 10 using 8 units.  If any foot and ankle issues on future she will come back and see me.  At this time she is officially discharged from my care  No follow-ups on file.

## 2023-10-11 ENCOUNTER — Encounter: Payer: Self-pay | Admitting: Neurology

## 2023-10-13 ENCOUNTER — Ambulatory Visit: Admitting: Neurology

## 2023-10-13 DIAGNOSIS — G478 Other sleep disorders: Secondary | ICD-10-CM

## 2023-10-13 DIAGNOSIS — R351 Nocturia: Secondary | ICD-10-CM

## 2023-10-13 DIAGNOSIS — G4700 Insomnia, unspecified: Secondary | ICD-10-CM

## 2023-10-13 DIAGNOSIS — Z820 Family history of epilepsy and other diseases of the nervous system: Secondary | ICD-10-CM

## 2023-10-13 DIAGNOSIS — R0681 Apnea, not elsewhere classified: Secondary | ICD-10-CM

## 2023-10-13 DIAGNOSIS — R0683 Snoring: Secondary | ICD-10-CM

## 2023-10-13 DIAGNOSIS — G4733 Obstructive sleep apnea (adult) (pediatric): Secondary | ICD-10-CM

## 2023-10-13 DIAGNOSIS — Z9189 Other specified personal risk factors, not elsewhere classified: Secondary | ICD-10-CM

## 2023-10-13 DIAGNOSIS — G4734 Idiopathic sleep related nonobstructive alveolar hypoventilation: Secondary | ICD-10-CM

## 2023-10-14 ENCOUNTER — Other Ambulatory Visit: Payer: Self-pay | Admitting: Family Medicine

## 2023-10-14 DIAGNOSIS — K219 Gastro-esophageal reflux disease without esophagitis: Secondary | ICD-10-CM

## 2023-10-16 ENCOUNTER — Encounter: Payer: Self-pay | Admitting: Family Medicine

## 2023-10-31 NOTE — Progress Notes (Signed)
 See procedure note.

## 2023-11-03 ENCOUNTER — Ambulatory Visit: Payer: Self-pay | Admitting: Neurology

## 2023-11-03 DIAGNOSIS — G4734 Idiopathic sleep related nonobstructive alveolar hypoventilation: Secondary | ICD-10-CM

## 2023-11-03 DIAGNOSIS — G4733 Obstructive sleep apnea (adult) (pediatric): Secondary | ICD-10-CM

## 2023-11-03 NOTE — Procedures (Signed)
 [facility]  HOME SLEEP TEST (SANSA) REPORT (Mail-Out Device):   STUDY DATE: 10/21/2023  DOB: 1960-08-08  MRN: [id number]  ORDERING CLINICIAN: Debbra Fairy, MD, PhD   REFERRING CLINICIAN: Estill Hemming, DO   CLINICAL INFORMATION/HISTORY: 63 year old female with an underlying medical history of reflux disease, hypertension, hyperlipidemia, diabetes, asthma, remote history of Bell's palsy on the R (previously had seen Dr. Tilda Fogo and Jeanmarie Millet, NP in this clinic), endometriosis, histoplasmosis, history of kidney stones, arthritis, and overweight state, who reports snoring and difficulty maintaining sleep, as well as nonrestorative sleep, witnessed apneas and nocturia.   PATIENT'S LAST REPORTED EPWORTH SLEEPINESS SCORE (ESS): 6/24.  BMI (at the time of sleep clinic visit and/or test date): 28.3 kg/m  FINDINGS:   Study Protocol:    The SANSA single-point-of-skin-contact chest-worn sensor - an FDA cleared and DOT approved type 4 home sleep test device - measures eight physiological channels,  including blood oxygen saturation (measured via PPG [photoplethysmography]), EKG-derived heart rate, respiratory effort, chest movement (measured via accelerometer), snoring, body position, and actigraphy. The device is designed to be worn for up to 10 hours per study.   Sleep Summary:   Total Recording Time (hours, min): 9 hours, 56 min  Total Effective Sleep Time (hours, min):  8 hours, 12 min  Sleep Efficiency (%):    83%   Respiratory Indices:   Calculated sAHI (per hour):  35.5/hour         Oxygen Saturation Statistics:    Oxygen Saturation (%) Mean: 93.4 %   Minimum oxygen saturation (%):                 72.4%   O2 Saturation Range (%): 72.4-98.5%   Time below or at 88% saturation: 26 min   Pulse Rate Statistics:   Pulse Mean (bpm):    79/min    Pulse Range (67-109/min)   Snoring: Mild to loud  IMPRESSION/DIAGNOSES:   OSA (obstructive sleep  apnea), severe Nocturnal Hypoxemia  RECOMMENDATIONS:   This home sleep test demonstrates severe obstructive sleep apnea with a total AHI of 35.5/hour and O2 nadir of 72.4% with significant time below or at 88% saturation of over 25 minutes for the study, indicating nocturnal hypoxemia. Snoring was detected, in the mild to loud range.  Treatment with positive airway pressure is highly recommended. The patient will be advised to proceed with an autoPAP titration/trial at home. A laboratory attended titration study can be considered in the future for optimization of treatment settings and to improve tolerance and compliance, if needed, down the road. Alternative treatment options are limited secondary to the severity of the patient's sleep disordered breathing, but may include surgical treatment with an implantable hypoglossal nerve stimulator (in carefully selected candidates, meeting criteria).  Concomitant weight loss is recommended (where clinically appropriate). Please note, that untreated obstructive sleep apnea may carry additional perioperative morbidity. Patients with significant obstructive sleep apnea should receive perioperative PAP therapy and the surgeons and particularly the anesthesiologist should be informed of the diagnosis and the severity of the sleep disordered breathing. The patient should be cautioned not to drive, work at heights, or operate dangerous or heavy equipment when tired or sleepy. Review and reiteration of good sleep hygiene measures should be pursued with any patient. Other causes of the patient's symptoms, including circadian rhythm disturbances, an underlying mood disorder, medication effect and/or an underlying medical problem cannot be ruled out based on this test. Clinical correlation is recommended.  The patient and her  referring provider will be notified of the test results. The patient will be seen in follow up in sleep clinic at [facility].  I certify that I have reviewed  the raw data recording prior to the issuance of this report in accordance with the standards of the American Academy of Sleep Medicine (AASM).    INTERPRETING PHYSICIAN:   Debbra Fairy, MD, PhD Medical Director, [facility]) Diplomat, ABPN (Neurology and Sleep)   [facility] [address] [place], [place] [address] [phone]

## 2023-11-07 NOTE — Telephone Encounter (Signed)
 I called pt.  Gave her results of sleep study.  Severe OSA. Urgent set up.  Recommend autopap. DME [employer] to authorize insurance and call her.  Proceed with autopap they will order and see you in appt for pick up machine, instructions, cleaning, mask fit.  She does have issues with touch and asthma.  I relayed multiple masks/ head gears.  See back for compliance (insurance) 2-3 months.  Appt made 01-17-2024 at 0900 with AL/NP.  Use 4 hr or more (medically beneficial). She verbalized understanding and is ok to proceed.

## 2023-11-07 NOTE — Telephone Encounter (Signed)
-----   Message from True Mar sent at 11/03/2023  3:20 PM EDT ----- Urgent set up requested on PAP therapy, due to severe OSA. Patient referred by PCP, seen by me on 03/13/2023, the patient had to delay sleep testing due to foot surgery and pain, she had a HST on 10/21/2023.    Please call and notify the patient that the recent home sleep test showed obstructive sleep apnea in the severe range. I recommend treatment for this in the form of autoPAP, which means, that we  don't have to bring her in for a sleep study with CPAP, but will let her start using a so called autoPAP machine at home, through a DME company (of her choice, or as per insurance requirement). The  DME representative will fit the patient with a mask of choice, educate her on how to use the machine, how to put the mask on, etc. I have placed an order in the chart. Please send the order to a  local DME, talk to patient, send report to referring MD. Please also reinforce the need for compliance with treatment. We will need a FU in sleep clinic for 10 weeks post-PAP set up, please arrange  that with me or one of our NPs. Thanks,   True Mar, MD, PhD [facility])    ----- Message ----- From: Mar True, MD Sent: 11/03/2023   3:18 PM EDT To: True Mar, MD

## 2023-11-08 NOTE — Telephone Encounter (Signed)
 New, Adine Neysa Nena GORMAN, RN; New, Bradley; Cain, Mitchell; Ziegler, Melissa; Garcia, Trang; 1 other Received, thank you     Previous Messages    ----- Message ----- From: Neysa Nena GORMAN, RN Sent: 11/07/2023   3:07 PM EDT To: Adine Leer; Avelina Sprung; Ephraim Dollar* Subject: new autopap user SEVERE OSA urgent set up      New order in epic for pt.   URGENT set up. SEVERE OSA.  Avelina FELIX Lachlan Pelto Female, 63 y.o., 1960-09-02 MRN: [id number] Phone: [phone]  Thank you,  Particia

## 2023-11-14 ENCOUNTER — Encounter: Payer: Self-pay | Admitting: Internal Medicine

## 2023-11-14 ENCOUNTER — Ambulatory Visit: Payer: 59 | Admitting: Internal Medicine

## 2023-11-14 VITALS — BP 120/70 | HR 88 | Ht 63.0 in | Wt 160.6 lb

## 2023-11-14 DIAGNOSIS — E785 Hyperlipidemia, unspecified: Secondary | ICD-10-CM

## 2023-11-14 DIAGNOSIS — E1165 Type 2 diabetes mellitus with hyperglycemia: Secondary | ICD-10-CM | POA: Diagnosis not present

## 2023-11-14 DIAGNOSIS — Z794 Long term (current) use of insulin: Secondary | ICD-10-CM

## 2023-11-14 LAB — POCT GLYCOSYLATED HEMOGLOBIN (HGB A1C): Hemoglobin A1C: 6.3 % — AB (ref 4.0–5.6)

## 2023-11-14 MED ORDER — OZEMPIC (2 MG/DOSE) 8 MG/3ML ~~LOC~~ SOPN: 2.0000 mg | PEN_INJECTOR | SUBCUTANEOUS | 3 refills | Status: AC

## 2023-11-14 NOTE — Addendum Note (Signed)
 Addended by: CLEOTILDE ROLIN RAMAN on: 11/14/2023 03:11 PM   Modules accepted: Orders

## 2023-11-14 NOTE — Patient Instructions (Addendum)
 Please increase: - Ozempic  2 mg weekly  Please use the following pump settings: - Basal rates: 12 am: 1.4 - Insulin  to carb ratio: 12 am: 1:1  Snack: 4-6 units Small meal: 8-12 units Regular meal: 14-18 units Large meal: 20 units - Target: 12 am: 110-120 - Correction factor (insulin  sensitivity factor):  12 am: 28 - Active insulin  time: 4h - Changes infusion site: q2 days  Backup regimen: - Lantus  30 units daily (increase as needed) - Lyumjev  10-16 units 3x a day before meals  Please come back for a follow-up appointment in 3-4 months.

## 2023-11-14 NOTE — Progress Notes (Addendum)
 Patient ID: Dominique [name], female   DOB: December 04, 1960, 63 y.o.   MRN: [id number]   HPI: Dominique [name] is a 63 y.o.-year-old female, returning for follow-up for DM2, dx in 2016, but GDM in 2000, insulin -dependent, uncontrolled, without long term complications.  She previously saw Dr. Kassie.  Last visit with me 4 months ago.  Interim history: No increased urination, nausea, chest pain.   At last visit, she had pain in the left foot - she had surgery to have the hardware removed.  She feels much better after the surgery. She was recently dx'ed with severe OSA >> will start on  CPAP.   Reviewed HbA1c levels: Lab Results  Component Value Date   HGBA1C 6.8 (A) 07/14/2023   HGBA1C 6.5 (A) 04/19/2023   HGBA1C 6.6 01/11/2023   HGBA1C 6.7 (A) 09/06/2022   HGBA1C 6.4 (A) 06/13/2022   HGBA1C 7.1 (A) 02/07/2022   HGBA1C 7.1 (A) 10/20/2021   HGBA1C 7.1 (A) 06/17/2021   HGBA1C 6.6 (A) 02/12/2021   HGBA1C 6.7 (A) 10/07/2020  01/11/2023: HbA1c 6.6% 12/19/2022: HbA1c 6.6% HbA1c 8.9%  At last visit she was on: -  >> stopped 10/2021 - Ozempic  0.5 >> 1 mg weekly  (had nausea with 2 mg dose) - Lantus  10 units in HS - added 06/2021 >> 34 >> 40 >> 48 units in HS - Lyumjev  6-10 units before B and D - added 10/2021 We had to stop Jardiance  07/2018 due to persistent yeast infections. She stopped metformin  IR due to lack of effect.  We had to stop metformin  ER due to nausea. She developed a rash with Trulicity  She was previously on nateglinide  and also on repaglinide .  Currently on OmniPod 5 CGM (with Lyumjev ): - Basal rates: 12 am: 1.35 >> 1.4 - Insulin  to carb ratio: 12 am: 1:1  Snack: 4-6 units Small meal: 8-12 units Regular meal: 14-18 units Large meal: 20 units - Target: 12 am: 110-120 - Correction factor (insulin  sensitivity factor):  12 am: 28 - Active insulin  time: 4h - Changes infusion site: q2 days  Total daily dose from basal insulin : 54% (46.6 units) >> 53% (46 units) >>  55% (48 units) Total daily dose from bolus insulin : 46% (40.2 units) >> 47% (40 units) >> 45% (39 units) Total daily dose 87-120 units daily  Backup regimen: - Lantus  30 units daily (increase as needed) - Lyumjev  10-16 units 3x a day before meals  Pt checks her sugars  >4x a day with her Dexcom CGM:  Prev.:   Previously:   Lowest sugar was 80s >> 76 >> 80s >> 52; she has hypoglycemia awareness in the 90s. Highest sugar was 300 >> 200s >> 318 >> 320 >> 299.  Glucometer: Accu-Chek guide  Pt's meals are: - Breakfast: 1-2 egg, 1-2 toast; egg + bacon - Lunch: eating out, salad, meat + veggies - Dinner: eating out, salad, meat + veggies - Dessert: apple pie, cake - Snacks: chips  -No CKD, last BUN/creatinine:  Lab Results  Component Value Date   BUN 11 04/25/2023   BUN 12 03/10/2022   CREATININE 0.80 04/25/2023   CREATININE 0.84 03/10/2022   Lab Results  Component Value Date   MICRALBCREAT 7 04/25/2023   MICRALBCREAT 10 02/14/2019  On losartan  100.  -+ HL; last set of lipids: 12/19/2022: 137/106/45/72 Lab Results  Component Value Date   CHOL 136 10/27/2021   HDL 48 10/27/2021   LDLCALC 69 10/27/2021   TRIG 102 10/27/2021   CHOLHDL 3.1  11/19/2020  On Lipitor 10, fenofibrate  160, fish oil 1000 mg 2x a day.  - last eye exam was in 09/06/2023: No DR. She has cataracts. Dr. Cleotilde.  She saw an ophthalmic surgeon >> contemplates palpebral surgery. On Xiidra and fish oil for dry eyes.  -No numbness and tingling in her feet.  She saw neurology -no peripheral neuropathy.  Last foot exam 06/30/2023 by Dr. Tobie with podiatry.  Off Gabapentin  - prev. For Bell's palsy. She has a history of a kidney stone in 2019.  Pt has FH of DM in mother and father.  ROS: + see HPI  I reviewed pt's medications, allergies, PMH, social hx, family hx, and changes were documented in the history of present illness. Otherwise, unchanged from my initial visit note.  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   Hypertension    Kidney stone    Kidney stones    Past Surgical History:  Procedure Laterality Date   FOOT ARTHRODESIS Left 09/05/2019   Procedure: Left Modified McBride, Left First and Second Tarsometatarsal Joint Arthrodesis;  Surgeon: Kit Rush, MD;  Location: [facility];  Service: Orthopedics;  Laterality: Left;   laparatomy  1985   RADIAL HEAD ARTHROPLASTY Left 02/23/2013   Procedure: LEFT RADIAL HEAD ARTHROPLASTY qwith ligament reconstruction;  Surgeon: Elsie Mussel, MD;  Location: [facility];  Service: Orthopedics;  Laterality: Left;   TONSILLECTOMY AND ADENOIDECTOMY  1967   uterine lining ablation  2002   Social History   Socioeconomic History   Marital status: Married    Spouse name: Not on file   Number of children: Not on file   Years of education: Not on file   Highest education level: 12th grade  Occupational History    Comment: unemployed  Tobacco Use   Smoking status: Never   Smokeless tobacco: Never  Substance and Sexual Activity   Alcohol use: No   Drug use: No   Sexual activity: Yes    Partners: Male  Other Topics Concern   Not on file  Social History Narrative   Exercise-- no   Social Drivers of Health   Financial Resource Strain: Low Risk  (01/19/2023)   Overall Financial Resource Strain (CARDIA)    Difficulty of Paying Living Expenses: Not hard at all  Food Insecurity: No Food Insecurity (01/19/2023)   Hunger Vital Sign    Worried About Running Out of Food in the Last Year: Never true    Ran Out of Food in the Last Year: Never true  Transportation Needs: No Transportation Needs (01/19/2023)   PRAPARE - Administrator, Civil Service (Medical): No    Lack of  Transportation (Non-Medical): No  Physical Activity: Insufficiently Active (01/19/2023)   Exercise Vital Sign    Days of Exercise per Week: 1 day    Minutes of Exercise per Session: 10 min  Stress: No Stress Concern Present (01/19/2023)   [name] of Occupational Health - Occupational Stress Questionnaire    Feeling of Stress : Not at all  Social Connections: Socially Integrated (01/19/2023)   Social Connection and Isolation Panel    Frequency of Communication with Friends and Family: More  than three times a week    Frequency of Social Gatherings with Friends and Family: Once a week    Attends Religious Services: More than 4 times per year    Active Member of [employer] or Organizations: Yes    Attends Engineer, structural: More than 4 times per year    Marital Status: Married  Catering manager Violence: Not on file   Current Outpatient Medications on File Prior to Visit  Medication Sig Dispense Refill   albuterol  (VENTOLIN  HFA) 108 (90 Base) MCG/ACT inhaler Inhale 2 puffs into the lungs every 6 (six) hours as needed for wheezing or shortness of breath. 18 g 5   ALPRAZolam  (XANAX ) 1 MG tablet Take 1 tablet (1 mg total) by mouth 2 (two) times daily as needed for anxiety. 20 tablet 0   beclomethasone (QVAR  REDIHALER) 40 MCG/ACT inhaler Inhale 2 puffs into the lungs 2 (two) times daily. 1 each 3   Blood Glucose Monitoring Suppl (CONTOUR NEXT ONE) KIT 1 kit by Does not apply route See admin instructions. For checking blood sugar 2 times a day 1 kit 0   Cholecalciferol  (VITAMIN D3 PO) Take 50 mcg by mouth daily.     Continuous Glucose Sensor (DEXCOM G7 SENSOR) MISC Use to check glucose continuously, change sensor every 10 days 9 each 3   doxycycline  (VIBRA -TABS) 100 MG tablet Take 1 tablet (100 mg total) by mouth 2 (two) times daily. 20 tablet 0   ezetimibe  (ZETIA ) 10 MG tablet TAKE 1 TABLET(10 MG) BY MOUTH DAILY 90 tablet 1   fenofibrate  160 MG tablet 1 po qd 90 tablet 3   fluticasone   (FLONASE ) 50 MCG/ACT nasal spray SHAKE LIQUID AND USE 2 SPRAYS IN EACH NOSTRIL DAILY 48 g 3   fluticasone  (FLOVENT  HFA) 110 MCG/ACT inhaler Inhale 2 puffs into the lungs 2 (two) times daily. 12 g 1   glucose blood (CONTOUR NEXT TEST) test strip USE TO CHECK BLOOD SUGAR TWICE DAILY 200 strip 5   ibuprofen  (ADVIL ) 800 MG tablet Take 1 tablet (800 mg total) by mouth every 6 (six) hours as needed. 60 tablet 1   Insulin  Disposable Pump (OMNIPOD 5 G7 PODS, GEN 5,) MISC 1 each by Does not apply route every other day. 90 each 3   insulin  glargine (LANTUS  SOLOSTAR) 100 UNIT/ML Solostar Pen Inject 30 Units into the skin at bedtime. 15 mL 1   Insulin  Lispro-aabc (LYUMJEV  KWIKPEN) 200 UNIT/ML KwikPen Inject 10-16 units under skin 3x a day as advised 9 mL 1   Insulin  Lispro-aabc (LYUMJEV ) 100 UNIT/ML SOLN Use up to 80 units daily and insulin  pump 90 mL 3   Insulin  Pen Needle (BD PEN NEEDLE NANO 2ND GEN) 32G X 4 MM MISC Inject 1 each into the skin 3 (three) times daily. as directed 100 each 1   levalbuterol  (XOPENEX ) 1.25 MG/3ML nebulizer solution USE ONE VIAL BY NEBULIZATION EVERY 6 HOURS AS NEEDED FOR WHEEZING OR SHORTNESS OF BREATH 675 mL 1   losartan  (COZAAR ) 100 MG tablet Take 1 tablet (100 mg total) by mouth daily. 90 tablet 3   magnesium 30 MG tablet Take by mouth daily.     Microlet Lancets MISC TEST BLOOD SUGAR TWICE DAILY 200 each 3   montelukast  (SINGULAIR ) 10 MG tablet Take 1 tablet (10 mg total) by mouth at bedtime. 90 tablet 1   NONFORMULARY OR COMPOUNDED ITEM Cmp, lipid   Dx hyperlipidemia 1 each 0   NONFORMULARY OR COMPOUNDED ITEM Cmp, lipid--- dx htn  and hyperlipidemia Microalbumin---  dx DM II 1 each 0   NONFORMULARY OR COMPOUNDED ITEM Hep C antibody--- need for hep c screen 1 each 0   NONFORMULARY OR COMPOUNDED ITEM Cbcd , cmp, lipid----- dx hyperlipidemia, htn, dm II 1 each 0   Omega-3 Fatty Acids (FISH OIL) 1000 MG CAPS Take by mouth.     omeprazole  (PRILOSEC) 40 MG capsule Take 1 capsule  (40 mg total) by mouth daily. 30 capsule 0   oxyCODONE -acetaminophen  (PERCOCET) 5-325 MG tablet Take 1 tablet by mouth every 4 (four) hours as needed for severe pain (pain score 7-10). 30 tablet 0   Semaglutide , 1 MG/DOSE, (OZEMPIC , 1 MG/DOSE,) 4 MG/3ML SOPN Inject 1 mg into the skin once a week. 9 mL 3   Tuberculin-Allergy  Syringes (B-D ALLERGY  SYRINGE 1CC/28G) 28G X 1/2 1 ML MISC Use 3x a day as advised 50 each 3   vitamin C  (ASCORBIC ACID ) 500 MG tablet Take 500 mg by mouth daily.     No current facility-administered medications on file prior to visit.   Allergies  Allergen Reactions   Jardiance  [Empagliflozin ] Other (See Comments)    Persistent yeast infection    Metformin  And Related Diarrhea   Pravastatin  Other (See Comments)    Increased blood glucose?    Family History  Problem Relation Age of Onset   Diabetes Mother    Hyperlipidemia Mother    Hypertension Mother    Diabetes Father    Hyperlipidemia Father    Hypertension Father    Sleep apnea Sister    PE: BP 120/70   Pulse 88   Ht 5' 3 (1.6 m)   Wt 160 lb 9.6 oz (72.8 kg)   SpO2 95%   BMI 28.45 kg/m  Wt Readings from Last 3 Encounters:  11/14/23 160 lb 9.6 oz (72.8 kg)  07/14/23 163 lb (73.9 kg)  06/30/23 161 lb 12.8 oz (73.4 kg)   Constitutional: normal weight but truncal adipose disposition, in NAD Eyes: EOMI, no exophthalmos ENT: no thyromegaly, no cervical lymphadenopathy Cardiovascular: RRR, No MRG Respiratory: CTA B Musculoskeletal: no deformities Skin: no rashes Neurological: no tremor with outstretched hands  ASSESSMENT: 1. DM2, insulin -dependent, uncontrolled, without long-term complications, but with hyperglycemia  Component     Latest Ref Rng 02/09/2022  Glutamic Acid Decarb Ab     0.0 - 5.0 U/mL <5.0   ZNT8 Antibodies     U/mL <15   Glucose, Plasma     70 - 99 mg/dL 876 (H)   C-Peptide     1.1 - 4.4 ng/mL 4.2    No evidence of pancreatic autoimmunity or insulin  deficiency.  2.  HL  PLAN:  1. Patient with longstanding, uncontrolled, type 2 diabetes, on the OmniPod 5 insulin  pump integrated with the Dexcom CGM, with Lyumjev  in the pump.  She could not tolerate metformin  due to nausea and SGLT2 inhibitors due to yeast infections but does tolerate Ozempic  well.  At last visit, HbA1c was slightly higher, at 6.8%.  Sugars were fluctuating high in the target range, especially in the last 2 weeks prior to our last appointment.  They were increasing after breakfast and then again after approximately 3 PM.  They were fairly well-controlled after lunch and dinner.  Upon questioning, she was not adjusting the dose of Lyumjev  up as advised, and we discussed about how to do so and the fact that the recommended doses are just starting points and she can continue to adjust the doses based on blood  sugar profiles.  Around 3-4 PM, she was eating an orange and was not bolusing for this.  I advised her to bolus whenever she was eating carbs.  We also increased her basal rates throughout the day - At the previous visit, upon her questioning, we discussed about ocular changes with Ozempic  and that these are usually connected with significant improvement in blood sugars and not specifically to Ozempic .  There were no reports of optic nerve inflammation in patients that were taking Ozempic , but causality was not demonstrated.  Also, she did not have this condition at the last visit with her ophthalmologist. -She is not comfortable making changes into her pump so I usually introduce changes into the pump for her. CGM interpretation: -At today's visit, we reviewed her CGM downloads: It appears that 67% of values are in target range (goal >70%), while 33% are higher than 180 (goal <25%), and 0% are lower than 70 (goal <4%).  The calculated average blood sugar is 169.  The projected HbA1c for the next 3 months (GMI) is 7.4%. -Reviewing the CGM trends, sugars are fluctuating in the upper half of the target  range, with higher sugars after all 3 meals.  Upon questioning, she is eating some night and we discussed about the need to increase her insulin  before such meals.  Also, she occasionally forgets to take the bolus of the meals and we discussed about the importance of continuing to inject Lyumjev  before each meal.  Upon her request, we can go ahead and increase the Ozempic  dose, to help her more with weight loss, but this will also help with postprandial hyperglycemic peaks. -She is preparing to start the CPAP and we discussed that correcting her OSA may also help with her blood sugars. - I suggested to:  Patient Instructions  Please increase: - Ozempic  2 mg weekly  Please use the following pump settings: - Basal rates: 12 am: 1.4 - Insulin  to carb ratio: 12 am: 1:1  Snack: 4-6 units Small meal: 8-12 units Regular meal: 14-18 units Large meal: 20 units - Target: 12 am: 110-120 - Correction factor (insulin  sensitivity factor):  12 am: 28 - Active insulin  time: 4h - Changes infusion site: q2 days  Backup regimen: - Lantus  30 units daily (increase as needed) - Lyumjev  10-16 units 3x a day before meals  Please come back for a follow-up appointment in 3-4 months.  - we checked her HbA1c: 6.3% (lower) - advised to check sugars at different times of the day - 4x a day, rotating check times - advised for yearly eye exams >> she is UTD - return to clinic in 3-4 months  2. HL - Reviewed latest lipid panel from 12/19/2022: LDL slightly above target but the rest of the fractions at goal:137/106/45/72 - Continues Lipitor 10 mg daily and fenofibrate  160 mg daily without side effects.  She is also on fish oil 1000 mg twice a day but she is actually using this for dry eyes.  LABS NEED TO GO TO [facility] -patient's husband works there.  Lela Fendt, MD PhD [facility] Endocrinology

## 2023-11-22 ENCOUNTER — Ambulatory Visit: Admitting: Podiatry

## 2023-11-22 DIAGNOSIS — R200 Anesthesia of skin: Secondary | ICD-10-CM

## 2023-11-22 DIAGNOSIS — R202 Paresthesia of skin: Secondary | ICD-10-CM

## 2023-11-22 NOTE — Progress Notes (Signed)
 Subjective:  Patient ID: Dominique [name], female    DOB: 03-Feb-1961,  MRN: [id number]  Chief Complaint  Patient presents with   Routine Post Op    POV #4 DOS 08/07/23 --- LEFT FOOT HARDWARE REMOVAL    DOS: 08/07/2023 Procedure: Left hardware removal  63 y.o. female returns for post-op check.  Patient states that she is doing okay.  No acute complaints.  Healing well from the surgical site.  Some neuritis symptoms noted.  Review of Systems: Negative except as noted in the HPI. Denies N/V/F/Ch.  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   Hypertension    Kidney stone    Kidney stones     Current Outpatient Medications:    albuterol  (VENTOLIN  HFA) 108 (90 Base) MCG/ACT inhaler, Inhale 2 puffs into the lungs every 6 (six) hours as needed for wheezing or shortness of breath., Disp: 18 g, Rfl: 5   ALPRAZolam  (XANAX ) 1 MG tablet, Take 1 tablet (1 mg total) by mouth 2 (two) times daily as needed for anxiety. (Patient not taking: Reported on 11/14/2023), Disp: 20 tablet, Rfl: 0   beclomethasone (QVAR  REDIHALER) 40 MCG/ACT inhaler, Inhale 2 puffs into the lungs 2 (two) times daily., Disp: 1 each, Rfl: 3   Blood Glucose Monitoring Suppl (CONTOUR NEXT ONE) KIT, 1 kit by Does not apply route See admin instructions. For checking blood sugar 2 times a day, Disp: 1 kit, Rfl: 0   Cholecalciferol  (VITAMIN D3 PO), Take 50 mcg by mouth daily., Disp: , Rfl:    Continuous Glucose Sensor (DEXCOM G7 SENSOR) MISC, Use to check glucose continuously, change sensor every 10 days, Disp: 9 each, Rfl: 3   doxycycline  (VIBRA -TABS) 100 MG tablet, Take 1 tablet (100 mg total) by mouth 2 (two) times daily. (Patient not taking: Reported on 11/14/2023), Disp: 20 tablet, Rfl: 0    ezetimibe  (ZETIA ) 10 MG tablet, TAKE 1 TABLET(10 MG) BY MOUTH DAILY, Disp: 90 tablet, Rfl: 1   fenofibrate  160 MG tablet, 1 po qd, Disp: 90 tablet, Rfl: 3   fluticasone  (FLONASE ) 50 MCG/ACT nasal spray, SHAKE LIQUID AND USE 2 SPRAYS IN EACH NOSTRIL DAILY, Disp: 48 g, Rfl: 3   fluticasone  (FLOVENT  HFA) 110 MCG/ACT inhaler, Inhale 2 puffs into the lungs 2 (two) times daily., Disp: 12 g, Rfl: 1   glucose blood (CONTOUR NEXT TEST) test strip, USE TO CHECK BLOOD SUGAR TWICE DAILY, Disp: 200 strip, Rfl: 5   ibuprofen  (ADVIL ) 800 MG tablet, Take 1 tablet (800 mg total) by mouth every 6 (six) hours as needed., Disp: 60 tablet, Rfl: 1   Insulin  Disposable Pump (OMNIPOD 5 G7 PODS, GEN 5,) MISC, 1 each by Does not apply route every other day., Disp: 90 each, Rfl: 3   insulin  glargine (LANTUS  SOLOSTAR) 100 UNIT/ML Solostar Pen, Inject 30 Units into the skin at bedtime., Disp: 15 mL, Rfl: 1   Insulin  Lispro-aabc (LYUMJEV  KWIKPEN) 200 UNIT/ML KwikPen, Inject 10-16 units under skin 3x a day as advised, Disp: 9 mL, Rfl: 1   Insulin  Lispro-aabc (LYUMJEV ) 100 UNIT/ML SOLN, Use up to 80 units daily and insulin  pump, Disp: 90 mL, Rfl: 3   Insulin  Pen Needle (BD  PEN NEEDLE NANO 2ND GEN) 32G X 4 MM MISC, Inject 1 each into the skin 3 (three) times daily. as directed, Disp: 100 each, Rfl: 1   levalbuterol  (XOPENEX ) 1.25 MG/3ML nebulizer solution, USE ONE VIAL BY NEBULIZATION EVERY 6 HOURS AS NEEDED FOR WHEEZING OR SHORTNESS OF BREATH, Disp: 675 mL, Rfl: 1   losartan  (COZAAR ) 100 MG tablet, Take 1 tablet (100 mg total) by mouth daily., Disp: 90 tablet, Rfl: 3   magnesium 30 MG tablet, Take by mouth daily., Disp: , Rfl:    Microlet Lancets MISC, TEST BLOOD SUGAR TWICE DAILY, Disp: 200 each, Rfl: 3   montelukast  (SINGULAIR ) 10 MG tablet, Take 1 tablet (10 mg total) by mouth at bedtime., Disp: 90 tablet, Rfl: 1   NONFORMULARY OR COMPOUNDED ITEM, Cmp, lipid   Dx hyperlipidemia, Disp: 1 each, Rfl: 0   NONFORMULARY OR COMPOUNDED  ITEM, Cmp, lipid--- dx htn and hyperlipidemia Microalbumin---  dx DM II, Disp: 1 each, Rfl: 0   NONFORMULARY OR COMPOUNDED ITEM, Hep C antibody--- need for hep c screen, Disp: 1 each, Rfl: 0   NONFORMULARY OR COMPOUNDED ITEM, Cbcd , cmp, lipid----- dx hyperlipidemia, htn, dm II, Disp: 1 each, Rfl: 0   Omega-3 Fatty Acids (FISH OIL) 1000 MG CAPS, Take by mouth., Disp: , Rfl:    omeprazole  (PRILOSEC) 40 MG capsule, Take 1 capsule (40 mg total) by mouth daily., Disp: 30 capsule, Rfl: 0   oxyCODONE -acetaminophen  (PERCOCET) 5-325 MG tablet, Take 1 tablet by mouth every 4 (four) hours as needed for severe pain (pain score 7-10). (Patient not taking: Reported on 11/14/2023), Disp: 30 tablet, Rfl: 0   Semaglutide , 2 MG/DOSE, (OZEMPIC , 2 MG/DOSE,) 8 MG/3ML SOPN, Inject 2 mg into the skin once a week., Disp: 9 mL, Rfl: 3   Tuberculin-Allergy  Syringes (B-D ALLERGY  SYRINGE 1CC/28G) 28G X 1/2 1 ML MISC, Use 3x a day as advised, Disp: 50 each, Rfl: 3   vitamin C  (ASCORBIC ACID ) 500 MG tablet, Take 500 mg by mouth daily., Disp: , Rfl:   Social History   Tobacco Use  Smoking Status Never  Smokeless Tobacco Never    Allergies  Allergen Reactions   Jardiance  [Empagliflozin ] Other (See Comments)    Persistent yeast infection    Metformin  And Related Diarrhea   Pravastatin  Other (See Comments)    Increased blood glucose?    Objective:  There were no vitals filed for this visit. There is no height or weight on file to calculate BMI. Constitutional Well developed. Well nourished.  Vascular Foot warm and well perfused. Capillary refill normal to all digits.   Neurologic Normal speech. Oriented to person, place, and time. Epicritic sensation to light touch grossly present bilaterally.  Dermatologic skin completely re-epithelialized no signs of deviations noted no complications noted.  Some neuritis symptoms noted.  Orthopedic: No further tenderness to palpation noted about the surgical site.    Radiographs: 3 views of skeletally mature left foot: No further hardware noted no abnormalities noted.  All hardware removed Assessment:   No diagnosis found.   Plan:  Patient was evaluated and treated and all questions answered.  S/p foot surgery left - Clinically healing well progressing well.  She is having some neuritis symptoms.  She has obtained TENS unit which seems to be helping.  Continue using TENS unit as needed. - Overall doing much better if any foot and ankle issues in the future she will come back and see me.  No follow-ups on file.

## 2023-12-18 ENCOUNTER — Encounter: Payer: Self-pay | Admitting: Family Medicine

## 2023-12-18 DIAGNOSIS — E785 Hyperlipidemia, unspecified: Secondary | ICD-10-CM

## 2023-12-18 MED ORDER — EZETIMIBE 10 MG PO TABS: ORAL_TABLET | ORAL | 1 refills | Status: DC

## 2024-01-16 ENCOUNTER — Telehealth: Payer: Self-pay | Admitting: Neurology

## 2024-01-16 NOTE — Telephone Encounter (Signed)
 Error

## 2024-01-16 NOTE — Telephone Encounter (Signed)
 Pt has been called and scheduled for her my chart vv initial cpap f/u for 4pm on 01-17-24   Pt understands that although there may be some limitations with this type of visit, we will take all precautions to reduce any security or privacy concerns.  Pt understands that this will be treated like an in office visit and we will file with pt's insurance, and there may be a patient responsible charge related to this service.

## 2024-01-16 NOTE — Telephone Encounter (Signed)
 Pt called to cancel;a ppt due to being really sick . This is Pt initial CPAP appt Pt is to be scheduled before 9/22 in order for insurance to  pay for machines and supplies . All appt seen were after that time .

## 2024-01-17 ENCOUNTER — Ambulatory Visit: Admitting: Family Medicine

## 2024-01-17 ENCOUNTER — Telehealth: Admitting: Neurology

## 2024-01-17 DIAGNOSIS — G4733 Obstructive sleep apnea (adult) (pediatric): Secondary | ICD-10-CM

## 2024-01-17 NOTE — Progress Notes (Signed)
 Patient: Dominique [name] Date of Birth: 17-Dec-1960  Reason for Visit: Follow up History from: Patient Primary Neurologist: Buck  Virtual Visit via Video Note  I connected with Dominique [name] on 01/17/24 at  4:00 PM EDT by a video enabled telemedicine application and verified that I am speaking with the correct person using two identifiers.  Location: Patient: at her home Provider: in the office    I discussed the limitations of evaluation and management by telemedicine and the availability of in person appointments. The patient expressed understanding and agreed to proceed.  ASSESSMENT AND PLAN 63 y.o. year old female   1.  OSA on CPAP (set up 11/15/2023.  HST 10/21/2023 severe OSA total AHI 35.5/hour)  - Excellent compliance.  Current settings 6 to 12 cm water.  Feels like not getting enough air.  Staying around 11.8, maxing at 12.  Will change range to 7 to 14 cm water.  Pull a download in 2 weeks.  Continue nightly use minimum 4 hours.  Has had very good subjective benefit from CPAP.  AHI is well treated at 1.0.  Scheduled 1 year virtual follow-up   HISTORY OF PRESENT ILLNESS: Today 01/17/24 Saw Dr. Buck in October 2024 for sleep consult reporting snoring and witnessed apneas.  HST 10/21/2023 showed severe OSA with a total AHI of 35.5/hour and O2 nadir of 72.4% with significant time below or at 88% saturation of over 25 minutes for the study, indicating nocturnal hypoxemia. CPAP setup 11/15/23.  CPAP report shows excellent compliance.  93% usage greater than 4 hours. 6-12 cm water. Leak 18.9, AHI 1.0. Thinks her CPAP is wonderful! Feels sleep is more restorative, more energy during the day. Husband no longer complaining of snoring. Using nasal pillow mask. Does feel like at times she is not getting enough air from CPAP.  HISTORY 03/13/23 Dr. Buck: I saw your patient, Dominique [name], upon your kind request in my sleep clinic today for initial consultation of her sleep  disorder, in particular, concern for underlying obstructive sleep apnea.  The patient is unaccompanied today.  As you know, Dominique [name] is a 63 year old female with an underlying medical history of reflux disease, hypertension, hyperlipidemia, diabetes, asthma, remote history of Bell's palsy on the R (previously had seen Dr. Jenel and Lauraine Born, NP in this clinic), endometriosis, histoplasmosis, history of kidney stones, arthritis, and overweight state, who reports snoring and difficulty maintaining sleep, as well as nonrestorative sleep, witnessed apneas and nocturia.  Her Epworth sleepiness score is 6 out of 24, fatigue severity score is 36 out of 63.  She lives with her husband, he has mentioned her snoring to her and also rare occasions of gasping sounds and breathing pauses in her sleep.  She has asthma and sleeps with a fan on, cannot tolerate a ceiling fan.  She has a TV in her bedroom but it is typically not on at night.  She does not work.  With her grown daughter lives with them.  She has a sister with sleep apnea who uses a PAP machine.  She reports that she cannot tolerate a mask on her face.  For her asthma she uses her Qvar  but not daily.  She is advised to talk to you about the frequency of Qvar  use, it is written for 2 puffs twice daily but she uses it only as needed.  She has tried over-the-counter medications for sleep including melatonin, p.m. medications and Benadryl, they helped a little bit.  She  does not like to take anything consistently, she does not take any Xanax  currently.  She goes to bed around 10 but it may be up to 2 hours before she falls asleep.  Sometimes she falls asleep within 15 minutes.  Her rise time is around 7.  She has nocturia typically once per average night.  She denies recurrent nocturnal or morning headaches.  She drinks caffeine in the form of diet Dr. Nunzio, up to 2 bottles per day, 12 ounce size.  She does not currently drink any alcohol.  She is a non-smoker.   She tries not to nap during the day.  She had a tonsillectomy at age 1.  I reviewed your office note from 01/20/2023.   REVIEW OF SYSTEMS: Out of a complete 14 system review of symptoms, the patient complains only of the following symptoms, and all other reviewed systems are negative.  See HPI  ALLERGIES: Allergies  Allergen Reactions   Jardiance  [Empagliflozin ] Other (See Comments)    Persistent yeast infection    Metformin  And Related Diarrhea   Pravastatin  Other (See Comments)    Increased blood glucose?     HOME MEDICATIONS: Outpatient Medications Prior to Visit  Medication Sig Dispense Refill   albuterol  (VENTOLIN  HFA) 108 (90 Base) MCG/ACT inhaler Inhale 2 puffs into the lungs every 6 (six) hours as needed for wheezing or shortness of breath. 18 g 5   ALPRAZolam  (XANAX ) 1 MG tablet Take 1 tablet (1 mg total) by mouth 2 (two) times daily as needed for anxiety. (Patient not taking: Reported on 11/14/2023) 20 tablet 0   beclomethasone (QVAR  REDIHALER) 40 MCG/ACT inhaler Inhale 2 puffs into the lungs 2 (two) times daily. 1 each 3   Blood Glucose Monitoring Suppl (CONTOUR NEXT ONE) KIT 1 kit by Does not apply route See admin instructions. For checking blood sugar 2 times a day 1 kit 0   Cholecalciferol  (VITAMIN D3 PO) Take 50 mcg by mouth daily.     Continuous Glucose Sensor (DEXCOM G7 SENSOR) MISC Use to check glucose continuously, change sensor every 10 days 9 each 3   doxycycline  (VIBRA -TABS) 100 MG tablet Take 1 tablet (100 mg total) by mouth 2 (two) times daily. (Patient not taking: Reported on 11/14/2023) 20 tablet 0   ezetimibe  (ZETIA ) 10 MG tablet TAKE 1 TABLET(10 MG) BY MOUTH DAILY 90 tablet 1   fenofibrate  160 MG tablet 1 po qd 90 tablet 3   fluticasone  (FLONASE ) 50 MCG/ACT nasal spray SHAKE LIQUID AND USE 2 SPRAYS IN EACH NOSTRIL DAILY 48 g 3   fluticasone  (FLOVENT  HFA) 110 MCG/ACT inhaler Inhale 2 puffs into the lungs 2 (two) times daily. 12 g 1   glucose blood (CONTOUR NEXT  TEST) test strip USE TO CHECK BLOOD SUGAR TWICE DAILY 200 strip 5   ibuprofen  (ADVIL ) 800 MG tablet Take 1 tablet (800 mg total) by mouth every 6 (six) hours as needed. 60 tablet 1   Insulin  Disposable Pump (OMNIPOD 5 G7 PODS, GEN 5,) MISC 1 each by Does not apply route every other day. 90 each 3   insulin  glargine (LANTUS  SOLOSTAR) 100 UNIT/ML Solostar Pen Inject 30 Units into the skin at bedtime. 15 mL 1   Insulin  Lispro-aabc (LYUMJEV  KWIKPEN) 200 UNIT/ML KwikPen Inject 10-16 units under skin 3x a day as advised 9 mL 1   Insulin  Lispro-aabc (LYUMJEV ) 100 UNIT/ML SOLN Use up to 80 units daily and insulin  pump 90 mL 3   Insulin  Pen Needle (BD PEN NEEDLE  NANO 2ND GEN) 32G X 4 MM MISC Inject 1 each into the skin 3 (three) times daily. as directed 100 each 1   levalbuterol  (XOPENEX ) 1.25 MG/3ML nebulizer solution USE ONE VIAL BY NEBULIZATION EVERY 6 HOURS AS NEEDED FOR WHEEZING OR SHORTNESS OF BREATH 675 mL 1   losartan  (COZAAR ) 100 MG tablet Take 1 tablet (100 mg total) by mouth daily. 90 tablet 3   magnesium 30 MG tablet Take by mouth daily.     Microlet Lancets MISC TEST BLOOD SUGAR TWICE DAILY 200 each 3   montelukast  (SINGULAIR ) 10 MG tablet Take 1 tablet (10 mg total) by mouth at bedtime. 90 tablet 1   NONFORMULARY OR COMPOUNDED ITEM Cmp, lipid   Dx hyperlipidemia 1 each 0   NONFORMULARY OR COMPOUNDED ITEM Cmp, lipid--- dx htn and hyperlipidemia Microalbumin---  dx DM II 1 each 0   NONFORMULARY OR COMPOUNDED ITEM Hep C antibody--- need for hep c screen 1 each 0   NONFORMULARY OR COMPOUNDED ITEM Cbcd , cmp, lipid----- dx hyperlipidemia, htn, dm II 1 each 0   Omega-3 Fatty Acids (FISH OIL) 1000 MG CAPS Take by mouth.     omeprazole  (PRILOSEC) 40 MG capsule Take 1 capsule (40 mg total) by mouth daily. 30 capsule 0   oxyCODONE -acetaminophen  (PERCOCET) 5-325 MG tablet Take 1 tablet by mouth every 4 (four) hours as needed for severe pain (pain score 7-10). (Patient not taking: Reported on 11/14/2023)  30 tablet 0   Semaglutide , 2 MG/DOSE, (OZEMPIC , 2 MG/DOSE,) 8 MG/3ML SOPN Inject 2 mg into the skin once a week. 9 mL 3   Tuberculin-Allergy  Syringes (B-D ALLERGY  SYRINGE 1CC/28G) 28G X 1/2 1 ML MISC Use 3x a day as advised 50 each 3   vitamin C  (ASCORBIC ACID ) 500 MG tablet Take 500 mg by mouth daily.     No facility-administered medications prior to visit.    PAST MEDICAL HISTORY: 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   Hypertension    Kidney stone    Kidney stones     PAST SURGICAL HISTORY: Past Surgical History:  Procedure Laterality Date   FOOT ARTHRODESIS Left 09/05/2019   Procedure: Left Modified McBride, Left First and Second Tarsometatarsal Joint Arthrodesis;  Surgeon: Kit Rush, MD;  Location: [facility];  Service: Orthopedics;  Laterality: Left;   laparatomy  1985   RADIAL HEAD ARTHROPLASTY Left 02/23/2013   Procedure: LEFT RADIAL HEAD ARTHROPLASTY qwith ligament reconstruction;  Surgeon: Elsie Mussel, MD;  Location: [facility] OR;  Service: Orthopedics;  Laterality: Left;   TONSILLECTOMY AND ADENOIDECTOMY  1967   uterine lining ablation  2002    FAMILY HISTORY: Family History  Problem Relation Age of Onset   Diabetes Mother    Hyperlipidemia Mother    Hypertension Mother    Diabetes Father    Hyperlipidemia Father    Hypertension Father    Sleep apnea Sister     SOCIAL HISTORY: Social History   Socioeconomic History   Marital status: Married    Spouse name: Not on file   Number of children: Not on file   Years of education: Not on file   Highest education level: 12th  grade  Occupational History    Comment: unemployed  Tobacco Use   Smoking status: Never   Smokeless tobacco: Never  Substance  and Sexual Activity   Alcohol use: No   Drug use: No   Sexual activity: Yes    Partners: Male  Other Topics Concern   Not on file  Social History Narrative   Exercise-- no   Social Drivers of Health   Financial Resource Strain: Low Risk  (01/19/2023)   Overall Financial Resource Strain (CARDIA)    Difficulty of Paying Living Expenses: Not hard at all  Food Insecurity: No Food Insecurity (01/19/2023)   Hunger Vital Sign    Worried About Running Out of Food in the Last Year: Never true    Ran Out of Food in the Last Year: Never true  Transportation Needs: No Transportation Needs (01/19/2023)   PRAPARE - Administrator, Civil Service (Medical): No    Lack of Transportation (Non-Medical): No  Physical Activity: Insufficiently Active (01/19/2023)   Exercise Vital Sign    Days of Exercise per Week: 1 day    Minutes of Exercise per Session: 10 min  Stress: No Stress Concern Present (01/19/2023)   [name] of Occupational Health - Occupational Stress Questionnaire    Feeling of Stress : Not at all  Social Connections: Socially Integrated (01/19/2023)   Social Connection and Isolation Panel    Frequency of Communication with Friends and Family: More than three times a week    Frequency of Social Gatherings with Friends and Family: Once a week    Attends Religious Services: More than 4 times per year    Active Member of [employer] or Organizations: Yes    Attends Engineer, structural: More than 4 times per year    Marital Status: Married  Catering manager Violence: Not on file    PHYSICAL EXAM  There were no vitals filed for this visit. There is no height or weight on file to calculate BMI.  Via [facility]  DIAGNOSTIC DATA (LABS, IMAGING, TESTING) - I reviewed patient records, labs, notes, testing and imaging myself where available.  Lab Results  Component Value Date   WBC 5.4 03/10/2022   HGB 15.0 03/10/2022   HCT 45.2 03/10/2022   MCV 89 03/10/2022   PLT 157  03/10/2022      Component Value Date/Time   NA 141 04/25/2023 1020   K 4.5 04/25/2023 1020   CL 104 04/25/2023 1020   CO2 22 04/25/2023 1020   GLUCOSE 216 (H) 04/25/2023 1020   GLUCOSE 211 (H) 04/03/2013 0750   BUN 11 04/25/2023 1020   CREATININE 0.80 04/25/2023 1020   CALCIUM  9.7 04/25/2023 1020   PROT 6.3 04/25/2023 1020   ALBUMIN 4.4 04/25/2023 1020   ALBUMIN 4.6 03/30/2015 1128   AST 33 04/25/2023 1020   AST 37 03/30/2015 1128   ALT 40 (H) 04/25/2023 1020   ALT 52 03/30/2015 1128   ALKPHOS 115 04/25/2023 1020   ALKPHOS 83 03/30/2015 1128   BILITOT 0.6 04/25/2023 1020   GFRNONAA 86 02/20/2020 0813   GFRAA 99 02/20/2020 0813   Lab Results  Component Value Date   CHOL 136 10/27/2021   HDL 48 10/27/2021   LDLCALC 69 10/27/2021   TRIG 102 10/27/2021   CHOLHDL 3.1 11/19/2020   Lab Results  Component Value Date   HGBA1C 6.3 (A) 11/14/2023   Lab Results  Component Value Date   VITAMINB12 256 03/10/2022   Lab Results  Component Value Date   TSH 1.650 03/10/2022    Lauraine Born, AGNP-C, DNP 01/17/2024, 3:56 PM [facility] [address], [address] [place], [place] [address] [phone]  .

## 2024-01-17 NOTE — Patient Instructions (Signed)
 I will increase your CPAP range from 6-12 to 7 to 14 cm water.  I will check a download in 2 weeks to see how the data looks.  Please reach out sooner if you are having difficulty.  Otherwise continue nightly use minimum 4 hours.  Thanks!!

## 2024-01-18 ENCOUNTER — Other Ambulatory Visit: Payer: Self-pay | Admitting: Family Medicine

## 2024-01-18 DIAGNOSIS — J302 Other seasonal allergic rhinitis: Secondary | ICD-10-CM

## 2024-01-31 ENCOUNTER — Encounter: Payer: Self-pay | Admitting: Neurology

## 2024-02-26 ENCOUNTER — Other Ambulatory Visit: Payer: Self-pay | Admitting: Internal Medicine

## 2024-03-01 ENCOUNTER — Other Ambulatory Visit: Payer: Self-pay

## 2024-03-01 MED ORDER — LYUMJEV 100 UNIT/ML IJ SOLN: INTRAMUSCULAR | 3 refills | Status: AC

## 2024-03-19 ENCOUNTER — Other Ambulatory Visit: Payer: Self-pay | Admitting: Family Medicine

## 2024-03-19 DIAGNOSIS — E785 Hyperlipidemia, unspecified: Secondary | ICD-10-CM

## 2024-03-20 ENCOUNTER — Ambulatory Visit: Admitting: Internal Medicine

## 2024-03-20 ENCOUNTER — Encounter: Payer: Self-pay | Admitting: Internal Medicine

## 2024-03-20 VITALS — BP 120/60 | HR 91 | Ht 63.0 in | Wt 163.8 lb

## 2024-03-20 DIAGNOSIS — Z794 Long term (current) use of insulin: Secondary | ICD-10-CM

## 2024-03-20 DIAGNOSIS — E785 Hyperlipidemia, unspecified: Secondary | ICD-10-CM

## 2024-03-20 DIAGNOSIS — E1165 Type 2 diabetes mellitus with hyperglycemia: Secondary | ICD-10-CM

## 2024-03-20 LAB — POCT GLYCOSYLATED HEMOGLOBIN (HGB A1C): Hemoglobin A1C: 6.3 % — AB (ref 4.0–5.6)

## 2024-03-20 MED ORDER — LYUMJEV KWIKPEN 200 UNIT/ML ~~LOC~~ SOPN: PEN_INJECTOR | SUBCUTANEOUS | 1 refills | Status: DC

## 2024-03-20 MED ORDER — LANTUS SOLOSTAR 100 UNIT/ML ~~LOC~~ SOPN: 30.0000 [IU] | PEN_INJECTOR | Freq: Every day | SUBCUTANEOUS | 1 refills | Status: AC

## 2024-03-20 NOTE — Patient Instructions (Addendum)
 Please continue: - Ozempic  2 mg weekly  Please use the following pump settings: - Basal rates: 12 am: 1.4 >> 1.5 - Insulin  to carb ratio: 12 am: 1:1  Snack: 4-6 units Small meal: 8-12 units Regular meal: 14-18 units Large meal: 20 units - Target: 12 am: 110-120 - Correction factor (insulin  sensitivity factor):  12 am: 28 - Active insulin  time: 4h - Changes infusion site: q2 days  Backup regimen: - Lantus  30 units daily (increase as needed) - Lyumjev  10-16 units 3x a day before meals  Please come back for a follow-up appointment in 3-4 months.

## 2024-03-20 NOTE — Addendum Note (Signed)
 Addended by: CLEOTILDE ROLIN RAMAN on: 03/20/2024 03:23 PM   Modules accepted: Orders

## 2024-03-20 NOTE — Progress Notes (Signed)
 Patient ID: Dominique [name], female   DOB: 08/18/1960, 63 y.o.   MRN: [id number]   HPI: Dominique [name] is a 63 y.o.-year-old female, returning for follow-up for DM2, dx in 2016, but GDM in 2000, insulin -dependent, uncontrolled, without long term complications.  She previously saw Dr. Kassie.  Last visit with me 4 months ago.  Interim history: No increased urination, nausea, chest pain.   She got hearing aids since last visit.  She is still getting used to them.  Reviewed HbA1c levels: 12/29/2023: HbA1c 6.5% Lab Results  Component Value Date   HGBA1C 6.3 (A) 11/14/2023   HGBA1C 6.8 (A) 07/14/2023   HGBA1C 6.5 (A) 04/19/2023   HGBA1C 6.6 01/11/2023   HGBA1C 6.7 (A) 09/06/2022   HGBA1C 6.4 (A) 06/13/2022   HGBA1C 7.1 (A) 02/07/2022   HGBA1C 7.1 (A) 10/20/2021   HGBA1C 7.1 (A) 06/17/2021   HGBA1C 6.6 (A) 02/12/2021  01/11/2023: HbA1c 6.6% 12/19/2022: HbA1c 6.6% HbA1c 8.9%  At last visit she was on: -  >> stopped 10/2021 - Ozempic  0.5 >> 1 mg weekly  (had nausea with 2 mg dose) - Lantus  10 units in HS - added 06/2021 >> 34 >> 40 >> 48 units in HS - Lyumjev  6-10 units before B and D - added 10/2021 We had to stop Jardiance  07/2018 due to persistent yeast infections. She stopped metformin  IR due to lack of effect.  We had to stop metformin  ER due to nausea. She developed a rash with Trulicity  She was previously on nateglinide  and also on repaglinide .  Currently on OmniPod 5 CGM (with Lyumjev ): - Basal rates: 12 am: 1.35 >> 1.4 - Insulin  to carb ratio: 12 am: 1:1  Snack: 4-6 units Small meal: 8-12 units Regular meal: 14-18 units Large meal: 20 units - Target: 12 am: 110-120 - Correction factor (insulin  sensitivity factor):  12 am: 28 - Active insulin  time: 4h - Changes infusion site: q2 days  Total daily dose from basal insulin : 54% (46.6 units) >> 53% (46 units) >> 55% (48 units) >> 55% (44 units) Total daily dose from bolus insulin : 46% (40.2 units) >> 47% (40  units) >> 45% (39 units) >> 45% (36 units) Total daily dose 80-100 units daily  Backup regimen: - Lantus  30 units daily (increase as needed) - Lyumjev  10-16 units 3x a day before meals  Pt checks her sugars  >4x a day with her Dexcom CGM:  Previously:  Prev.:  Lowest sugar was 76 >> 80s >> 52; she has hypoglycemia awareness in the 90s. Highest sugar was 320 >> 299.  Glucometer: Accu-Chek guide  Pt's meals are: - Breakfast: 1-2 egg, 1-2 toast; egg + bacon - Lunch: eating out, salad, meat + veggies - Dinner: eating out, salad, meat + veggies - Dessert: apple pie, cake - Snacks: chips  -No CKD, last BUN/creatinine:  Lab Results  Component Value Date   BUN 11 04/25/2023   BUN 12 03/10/2022   CREATININE 0.80 04/25/2023   CREATININE 0.84 03/10/2022   Lab Results  Component Value Date   MICRALBCREAT 7 04/25/2023   MICRALBCREAT 10 02/14/2019  On losartan  100.  -+ HL; last set of lipids:  12/19/2022: 137/106/45/72 Lab Results  Component Value Date   CHOL 136 10/27/2021   HDL 48 10/27/2021   LDLCALC 69 10/27/2021   TRIG 102 10/27/2021   CHOLHDL 3.1 11/19/2020  On Lipitor 10, fenofibrate  160, fish oil 1000 mg 2x a day.  - last eye exam was in 09/06/2023: No DR.  She has cataracts. Dr. Cleotilde.  She saw an ophthalmic surgeon >> contemplates palpebral surgery. On Xiidra and fish oil for dry eyes.  -No numbness and tingling in her feet.  She saw neurology -no peripheral neuropathy.  Last foot exam 06/30/2023 by Dr. Tobie with podiatry.  Off Gabapentin  - prev. For Bell's palsy. She has a history of a kidney stone in 2019.  Pt has FH of DM in mother and father.  ROS: + see HPI  I reviewed pt's medications, allergies, PMH, social hx, family hx, and changes were documented in the history of present illness. Otherwise, unchanged from my initial visit note.  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   Hypertension    Kidney stone    Kidney stones    Past Surgical History:  Procedure Laterality Date   FOOT ARTHRODESIS Left 09/05/2019   Procedure: Left Modified McBride, Left First and Second Tarsometatarsal Joint Arthrodesis;  Surgeon: Dominique Rush, MD;  Location: [facility];  Service: Orthopedics;  Laterality: Left;   laparatomy  1985   RADIAL HEAD ARTHROPLASTY Left 02/23/2013   Procedure: LEFT RADIAL HEAD ARTHROPLASTY qwith ligament reconstruction;  Surgeon: Dominique Mussel, MD;  Location: [facility] OR;  Service: Orthopedics;  Laterality: Left;   TONSILLECTOMY AND ADENOIDECTOMY  1967   uterine lining ablation  2002   Social History   Socioeconomic History   Marital status: Married    Spouse name: Not on file   Number of children: Not on file   Years of education: Not on file   Highest education level: 12th grade  Occupational History    Comment: unemployed  Tobacco Use   Smoking status: Never   Smokeless tobacco: Never  Substance and Sexual Activity   Alcohol use: No   Drug use: No   Sexual activity: Yes    Partners: Male  Other Topics Concern   Not on file  Social History Narrative   Exercise-- no   Social Drivers of Health   Financial Resource Strain: Low Risk  (01/19/2023)   Overall Financial Resource Strain (CARDIA)    Difficulty of Paying Living Expenses: Not hard at all  Food Insecurity: No Food Insecurity (01/19/2023)   Hunger Vital Sign    Worried About Running Out of Food in the Last Year: Never true    Ran Out of Food in the Last Year: Never true  Transportation Needs: No Transportation Needs (01/19/2023)   PRAPARE - Administrator, Civil Service (Medical): No    Lack of Transportation (Non-Medical): No  Physical Activity: Insufficiently Active (01/19/2023)   Exercise  Vital Sign    Days of Exercise per Week: 1 day    Minutes of Exercise per Session: 10 min  Stress: No Stress Concern Present (01/19/2023)   [name] of Occupational Health - Occupational Stress Questionnaire    Feeling of Stress : Not at all  Social Connections: Socially Integrated (01/19/2023)   Social Connection and Isolation Panel    Frequency of Communication with Friends and Family: More than three times a week    Frequency of Social Gatherings with Friends and Family: Once a week    Attends Religious  Services: More than 4 times per year    Active Member of Clubs or Organizations: Yes    Attends Banker Meetings: More than 4 times per year    Marital Status: Married  Catering Manager Violence: Not on file   Current Outpatient Medications on File Prior to Visit  Medication Sig Dispense Refill   albuterol  (VENTOLIN  HFA) 108 (90 Base) MCG/ACT inhaler Inhale 2 puffs into the lungs every 6 (six) hours as needed for wheezing or shortness of breath. 18 g 5   ALPRAZolam  (XANAX ) 1 MG tablet Take 1 tablet (1 mg total) by mouth 2 (two) times daily as needed for anxiety. (Patient not taking: Reported on 11/14/2023) 20 tablet 0   beclomethasone (QVAR  REDIHALER) 40 MCG/ACT inhaler Inhale 2 puffs into the lungs 2 (two) times daily. 1 each 3   Blood Glucose Monitoring Suppl (CONTOUR NEXT ONE) Dominique 1 Dominique by Does not apply route See admin instructions. For checking blood sugar 2 times a day 1 Dominique 0   Cholecalciferol  (VITAMIN D3 PO) Take 50 mcg by mouth daily.     Continuous Glucose Sensor (DEXCOM G7 SENSOR) MISC Use to check glucose continuously, change sensor every 10 days 9 each 3   doxycycline  (VIBRA -TABS) 100 MG tablet Take 1 tablet (100 mg total) by mouth 2 (two) times daily. (Patient not taking: Reported on 11/14/2023) 20 tablet 0   ezetimibe  (ZETIA ) 10 MG tablet Take 1 tablet (10 mg total) by mouth daily. Needs appt 15 tablet 0   fenofibrate  160 MG tablet 1 po qd 90 tablet 3    fluticasone  (FLONASE ) 50 MCG/ACT nasal spray Place 2 sprays into both nostrils daily. Needs appt 16 g 0   fluticasone  (FLOVENT  HFA) 110 MCG/ACT inhaler Inhale 2 puffs into the lungs 2 (two) times daily. 12 g 1   glucose blood (CONTOUR NEXT TEST) test strip USE TO CHECK BLOOD SUGAR TWICE DAILY 200 strip 5   ibuprofen  (ADVIL ) 800 MG tablet Take 1 tablet (800 mg total) by mouth every 6 (six) hours as needed. 60 tablet 1   Insulin  Disposable Pump (OMNIPOD 5 G7 PODS, GEN 5,) MISC 1 each by Does not apply route every other day. 90 each 3   insulin  glargine (LANTUS  SOLOSTAR) 100 UNIT/ML Solostar Pen Inject 30 Units into the skin at bedtime. 15 mL 1   Insulin  Lispro-aabc (LYUMJEV  KWIKPEN) 200 UNIT/ML KwikPen Inject 10-16 units under skin 3x a day as advised 9 mL 1   Insulin  Lispro-aabc (LYUMJEV ) 100 UNIT/ML SOLN USE UP TO 200 UNITS EVERY OTHER  DAY IN INSULIN  PUMP 180 mL 3   Insulin  Pen Needle (BD PEN NEEDLE NANO 2ND GEN) 32G X 4 MM MISC Inject 1 each into the skin 3 (three) times daily. as directed 100 each 1   levalbuterol  (XOPENEX ) 1.25 MG/3ML nebulizer solution USE ONE VIAL BY NEBULIZATION EVERY 6 HOURS AS NEEDED FOR WHEEZING OR SHORTNESS OF BREATH 675 mL 1   losartan  (COZAAR ) 100 MG tablet Take 1 tablet (100 mg total) by mouth daily. 90 tablet 3   magnesium 30 MG tablet Take by mouth daily.     Microlet Lancets MISC TEST BLOOD SUGAR TWICE DAILY 200 each 3   montelukast  (SINGULAIR ) 10 MG tablet Take 1 tablet (10 mg total) by mouth at bedtime. 90 tablet 1   NONFORMULARY OR COMPOUNDED ITEM Cmp, lipid   Dx hyperlipidemia 1 each 0   NONFORMULARY OR COMPOUNDED ITEM Cmp, lipid--- dx htn and hyperlipidemia Microalbumin---  dx DM  II 1 each 0   NONFORMULARY OR COMPOUNDED ITEM Hep C antibody--- need for hep c screen 1 each 0   NONFORMULARY OR COMPOUNDED ITEM Cbcd , cmp, lipid----- dx hyperlipidemia, htn, dm II 1 each 0   Omega-3 Fatty Acids (FISH OIL) 1000 MG CAPS Take by mouth.     omeprazole  (PRILOSEC) 40 MG  capsule Take 1 capsule (40 mg total) by mouth daily. 30 capsule 0   oxyCODONE -acetaminophen  (PERCOCET) 5-325 MG tablet Take 1 tablet by mouth every 4 (four) hours as needed for severe pain (pain score 7-10). (Patient not taking: Reported on 11/14/2023) 30 tablet 0   Semaglutide , 2 MG/DOSE, (OZEMPIC , 2 MG/DOSE,) 8 MG/3ML SOPN Inject 2 mg into the skin once a week. 9 mL 3   Tuberculin-Allergy  Syringes (B-D ALLERGY  SYRINGE 1CC/28G) 28G X 1/2 1 ML MISC Use 3x a day as advised 50 each 3   vitamin C  (ASCORBIC ACID ) 500 MG tablet Take 500 mg by mouth daily.     No current facility-administered medications on file prior to visit.   Allergies  Allergen Reactions   Jardiance  [Empagliflozin ] Other (See Comments)    Persistent yeast infection    Metformin  And Related Diarrhea   Pravastatin  Other (See Comments)    Increased blood glucose?    Family History  Problem Relation Age of Onset   Diabetes Mother    Hyperlipidemia Mother    Hypertension Mother    Diabetes Father    Hyperlipidemia Father    Hypertension Father    Sleep apnea Sister    PE: There were no vitals taken for this visit. Wt Readings from Last 3 Encounters:  11/14/23 160 lb 9.6 oz (72.8 kg)  07/14/23 163 lb (73.9 kg)  06/30/23 161 lb 12.8 oz (73.4 kg)   Constitutional: normal weight but truncal adipose disposition, in NAD Eyes: EOMI, no exophthalmos ENT: no thyromegaly, no cervical lymphadenopathy Cardiovascular: RRR, No MRG Respiratory: CTA B Musculoskeletal: no deformities Skin: no rashes Neurological: no tremor with outstretched hands  ASSESSMENT: 1. DM2, insulin -dependent, uncontrolled, without long-term complications, but with hyperglycemia  Component     Latest Ref Rng 02/09/2022  Glutamic Acid Decarb Ab     0.0 - 5.0 U/mL <5.0   ZNT8 Antibodies     U/mL <15   Glucose, Plasma     70 - 99 mg/dL 876 (H)   C-Peptide     1.1 - 4.4 ng/mL 4.2    No evidence of pancreatic autoimmunity or insulin   deficiency.  2. HL  PLAN:  1. Patient with longstanding, uncontrolled, type 2 diabetes, on the OmniPod 5 insulin  pump integrated with the Dexcom CGM with Lyumjev  in the pump.  She could not tolerate metformin  due to nausea and SGLT2 inhibitors due to yeast infections but she tolerates Ozempic  well.  We increased the dose at last visit to 2 mg weekly.  At that time, HbA1c was lower, at 6.3% and sugars were fluctuating in the upper half of the target range, with higher sugars after all 3 meals.  We discussed about increasing insulin  before larger meals.  She was occasionally forgetting to bolus before the meals and we discussed about continuing to inject Lyumjev  right at the start of the meal.  She was preparing to start the CPAP and we discussed that correcting OSA may also help with blood sugars. -She had another HbA1c obtained 3 months ago and this was slightly higher, but still at goal, at 6.5% -She is not comfortable making changes into  her pump so I usually introduce changes into the pump for her. CGM interpretation: -At today's visit, we reviewed her CGM downloads: It appears that 78% of values are in target range (goal >70%), while 22% are higher than 180 (goal <25%), and 0% are lower than 70 (goal <4%).  The calculated average blood sugar is 160.  The projected HbA1c for the next 3 months (GMI) is 7.1%. -Reviewing the CGM trends, sugars of appear to be fluctuating slightly lower in target range compared to last visit, but still in the upper 50% of the range, weight occasionally excursions above the target range after dinner.  We discussed about for larger meals, she will need to increase her Lyumjev  doses, but will also go ahead and increase her basal rate slightly so that sugars start fluctuating in the lower range.  I also refilled her Lantus  and Lyumjev  pens for backup.  We discussed about other sites where she can attach the pump and the sensor.  I introduced the basal rate change into the pump  for her. - I suggested to:  Patient Instructions  Please continue: - Ozempic  2 mg weekly  Please use the following pump settings: - Basal rates: 12 am: 1.4 >> 1.5 - Insulin  to carb ratio: 12 am: 1:1  Snack: 4-6 units Small meal: 8-12 units Regular meal: 14-18 units Large meal: 20 units - Target: 12 am: 110-120 - Correction factor (insulin  sensitivity factor):  12 am: 28 - Active insulin  time: 4h - Changes infusion site: q2 days  Backup regimen: - Lantus  30 units daily (increase as needed) - Lyumjev  10-16 units 3x a day before meals  Please come back for a follow-up appointment in 3-4 months.  - we checked her HbA1c: 6.3% (lower) - advised to check sugars at different times of the day - 4x a day, rotating check times - advised for yearly eye exams >> she is UTD - return to clinic in 3-4 months  2. HL - Reviewed latest lipid panel from 12/2023: LDL slightly above our target but with the rest of the fractions at goal:  - Continues Lipitor 10 mg daily and fenofibrate  160 mg daily without side effects.  She is also on fish oil 1000 mg twice a day but she uses this for dry eyes.  LABS NEED TO GO TO [facility] -patient's husband works there.  Lela Fendt, MD PhD [facility] Endocrinology

## 2024-03-22 ENCOUNTER — Ambulatory Visit: Admitting: Podiatry

## 2024-03-22 ENCOUNTER — Encounter: Payer: Self-pay | Admitting: Podiatry

## 2024-03-22 VITALS — Ht 63.0 in | Wt 163.0 lb

## 2024-03-22 DIAGNOSIS — Q666 Other congenital valgus deformities of feet: Secondary | ICD-10-CM | POA: Diagnosis not present

## 2024-03-22 DIAGNOSIS — M7751 Other enthesopathy of right foot: Secondary | ICD-10-CM

## 2024-03-22 NOTE — Progress Notes (Signed)
 Subjective:  Patient ID: Dominique [name], female    DOB: [birth year],  MRN: [id number]  Chief Complaint  Patient presents with   Foot Pain    Patient is here for Numbness and tingling      63 y.o. female presents with the above complaint.  Patient presents with new complaint of right second metatarsal phalangeal joint pain hurts with ambulation worse with pressure wanted get it evaluated she states that it feels like stepping on a ball of foot or stone.  She wanted to get it evaluated she denies any other acute complaints.  She has not seen anyone else prior to seeing me for this.  She does not wear any orthotics   Review of Systems: Negative except as noted in the HPI. Denies N/V/F/Ch.  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   Hypertension    Kidney stone    Kidney stones     Current Outpatient Medications:    albuterol  (VENTOLIN  HFA) 108 (90 Base) MCG/ACT inhaler, Inhale 2 puffs into the lungs every 6 (six) hours as needed for wheezing or shortness of breath., Disp: 18 g, Rfl: 5   beclomethasone (QVAR  REDIHALER) 40 MCG/ACT inhaler, Inhale 2 puffs into the lungs 2 (two) times daily., Disp: 1 each, Rfl: 3   Blood Glucose Monitoring Suppl (CONTOUR NEXT ONE) KIT, 1 kit by Does not apply route See admin instructions. For checking blood sugar 2 times a day, Disp: 1 kit, Rfl: 0   Cholecalciferol  (VITAMIN D3 PO), Take 50 mcg by mouth daily., Disp: , Rfl:    Continuous Glucose Sensor (DEXCOM G7 SENSOR) MISC, Use to check glucose continuously, change sensor every 10 days, Disp: 9 each, Rfl: 3   ezetimibe  (ZETIA ) 10 MG tablet, Take 1 tablet (10 mg total) by mouth daily. Needs appt, Disp: 15 tablet, Rfl: 0   fenofibrate  160 MG tablet, 1 po qd,  Disp: 90 tablet, Rfl: 3   fluticasone  (FLONASE ) 50 MCG/ACT nasal spray, Place 2 sprays into both nostrils daily. Needs appt, Disp: 16 g, Rfl: 0   fluticasone  (FLOVENT  HFA) 110 MCG/ACT inhaler, Inhale 2 puffs into the lungs 2 (two) times daily., Disp: 12 g, Rfl: 1   glucose blood (CONTOUR NEXT TEST) test strip, USE TO CHECK BLOOD SUGAR TWICE DAILY, Disp: 200 strip, Rfl: 5   ibuprofen  (ADVIL ) 800 MG tablet, Take 1 tablet (800 mg total) by mouth every 6 (six) hours as needed., Disp: 60 tablet, Rfl: 1   Insulin  Disposable Pump (OMNIPOD 5 G7 PODS, GEN 5,) MISC, 1 each by Does not apply route every other day., Disp: 90 each, Rfl: 3   insulin  glargine (LANTUS  SOLOSTAR) 100 UNIT/ML Solostar Pen, Inject 30 Units into the skin at bedtime., Disp: 15 mL, Rfl: 1   Insulin  Lispro-aabc (LYUMJEV ) 100 UNIT/ML SOLN, USE UP TO 200 UNITS EVERY OTHER  DAY IN INSULIN  PUMP, Disp: 180 mL, Rfl: 3   Insulin  Pen Needle (BD PEN NEEDLE NANO 2ND GEN) 32G X 4 MM MISC, Inject 1 each into the skin 3 (three) times daily. as directed, Disp: 100 each, Rfl: 1   levalbuterol  (XOPENEX ) 1.25 MG/3ML nebulizer solution, USE ONE VIAL BY NEBULIZATION EVERY 6  HOURS AS NEEDED FOR WHEEZING OR SHORTNESS OF BREATH, Disp: 675 mL, Rfl: 1   losartan  (COZAAR ) 100 MG tablet, Take 1 tablet (100 mg total) by mouth daily., Disp: 90 tablet, Rfl: 3   magnesium 30 MG tablet, Take by mouth daily., Disp: , Rfl:    Microlet Lancets MISC, TEST BLOOD SUGAR TWICE DAILY, Disp: 200 each, Rfl: 3   montelukast  (SINGULAIR ) 10 MG tablet, Take 1 tablet (10 mg total) by mouth at bedtime., Disp: 90 tablet, Rfl: 1   NONFORMULARY OR COMPOUNDED ITEM, Cmp, lipid   Dx hyperlipidemia, Disp: 1 each, Rfl: 0   NONFORMULARY OR COMPOUNDED ITEM, Cmp, lipid--- dx htn and hyperlipidemia Microalbumin---  dx DM II, Disp: 1 each, Rfl: 0   NONFORMULARY OR COMPOUNDED ITEM, Hep C antibody--- need for hep c screen, Disp: 1 each, Rfl: 0   NONFORMULARY OR COMPOUNDED ITEM, Cbcd , cmp, lipid-----  dx hyperlipidemia, htn, dm II, Disp: 1 each, Rfl: 0   Omega-3 Fatty Acids (FISH OIL) 1000 MG CAPS, Take by mouth., Disp: , Rfl:    omeprazole  (PRILOSEC) 40 MG capsule, Take 1 capsule (40 mg total) by mouth daily., Disp: 30 capsule, Rfl: 0   Semaglutide , 2 MG/DOSE, (OZEMPIC , 2 MG/DOSE,) 8 MG/3ML SOPN, Inject 2 mg into the skin once a week., Disp: 9 mL, Rfl: 3   Tuberculin-Allergy  Syringes (B-D ALLERGY  SYRINGE 1CC/28G) 28G X 1/2 1 ML MISC, Use 3x a day as advised, Disp: 50 each, Rfl: 3   vitamin C  (ASCORBIC ACID ) 500 MG tablet, Take 500 mg by mouth daily., Disp: , Rfl:    Insulin  Lispro-aabc (LYUMJEV  KWIKPEN) 200 UNIT/ML KwikPen, Inject 10-16 Units into the skin 3 (three) times daily., Disp: 9 mL, Rfl: 3  Social History   Tobacco Use  Smoking Status Never  Smokeless Tobacco Never    Allergies  Allergen Reactions   Jardiance  [Empagliflozin ] Other (See Comments)    Persistent yeast infection    Metformin  And Related Diarrhea   Pravastatin  Other (See Comments)    Increased blood glucose?    Objective:  There were no vitals filed for this visit. Body mass index is 28.87 kg/m. Constitutional Well developed. Well nourished.  Vascular Dorsalis pedis pulses palpable bilaterally. Posterior tibial pulses palpable bilaterally. Capillary refill normal to all digits.  No cyanosis or clubbing noted. Pedal hair growth normal.  Neurologic Normal speech. Oriented to person, place, and time. Epicritic sensation to light touch grossly present bilaterally.  Dermatologic Nails well groomed and normal in appearance. No open wounds. No skin lesions.  Orthopedic: Pain on palpation right second metatarsal phalangeal joint pain with range of motion of the joint.  No deep intra-articular second MTP pain noted.  Pain right over the joint itself.  No extensor flexor tendinitis noted pes planovalgus foot structure noted   Radiographs: None Assessment:   1. Capsulitis of metatarsophalangeal (MTP) joint  of right foot   2. Pes planovalgus    Plan:  Patient was evaluated and treated and all questions answered.  Right second MTP capsulitis - All questions and concerns were discussed with the patient in extensive detail given the amount of pain that she is experiencing she would benefit from a steroid injection to help decrease acute inflammatory, surgical pain.  Patient agrees with plan to proceed with steroid injection -A steroid injection was performed at right second MTP using 1% plain Lidocaine  and 10 mg of Kenalog . This was well tolerated.  Pes planovalgus/foot deformity -I explained to patient the etiology of pes  planovalgus and relationship with heel pain/arch pain and various treatment options were discussed.  Given patient foot structure in the setting of heel pain/arch pain I believe patient will benefit from custom-made orthotics to help control the hindfoot motion support the arch of the foot and take the stress away from arches.  Patient agrees with the plan like to proceed with orthotics -Patient was casted for orthotics with offloading of second metatarsal phalangeal joint   No follow-ups on file.

## 2024-03-26 ENCOUNTER — Other Ambulatory Visit: Payer: Self-pay | Admitting: Internal Medicine

## 2024-03-26 ENCOUNTER — Encounter: Payer: Self-pay | Admitting: Internal Medicine

## 2024-03-26 ENCOUNTER — Other Ambulatory Visit: Payer: Self-pay

## 2024-03-29 MED ORDER — LYUMJEV KWIKPEN 200 UNIT/ML ~~LOC~~ SOPN: 10.0000 [IU] | PEN_INJECTOR | Freq: Three times a day (TID) | SUBCUTANEOUS | 3 refills | Status: AC

## 2024-03-29 NOTE — Addendum Note (Signed)
 Addended by: CLEOTILDE ROLIN RAMAN on: 03/29/2024 02:11 PM   Modules accepted: Orders

## 2024-03-29 NOTE — Telephone Encounter (Signed)
 I called the pharmacy this morning and they do not open until 9am. Will try again later.

## 2024-04-16 ENCOUNTER — Ambulatory Visit

## 2024-04-16 DIAGNOSIS — M7751 Other enthesopathy of right foot: Secondary | ICD-10-CM

## 2024-04-16 NOTE — Progress Notes (Signed)
Patient presents today to pick up orthotics. They are noted to contour the arch and foot nicely and fit well in the shoes.  Break in period recommended and instructions for wear given.   She will call if any concerns arise.

## 2024-04-19 ENCOUNTER — Ambulatory Visit: Admitting: Podiatry

## 2024-04-21 ENCOUNTER — Encounter (HOSPITAL_COMMUNITY): Payer: Self-pay

## 2024-04-21 ENCOUNTER — Ambulatory Visit (HOSPITAL_COMMUNITY): Admission: EM | Admit: 2024-04-21 | Discharge: 2024-04-21 | Disposition: A | Discharge status: Home / Self Care

## 2024-04-21 DIAGNOSIS — N200 Calculus of kidney: Secondary | ICD-10-CM | POA: Diagnosis not present

## 2024-04-21 LAB — POCT URINE DIPSTICK
Bilirubin, UA: NEGATIVE
Glucose, UA: NEGATIVE mg/dL
Ketones, POC UA: NEGATIVE mg/dL
Leukocytes, UA: NEGATIVE
Nitrite, UA: NEGATIVE
POC PROTEIN,UA: 30 — AB
Spec Grav, UA: >=1.03 — AB (ref 1.010–1.025)
Urobilinogen, UA: 0.2 U/dL
pH, UA: 5.5 (ref 5.0–8.0)

## 2024-04-21 MED ORDER — IBUPROFEN 800 MG PO TABS: 800.0000 mg | ORAL_TABLET | Freq: Three times a day (TID) | ORAL | 1 refills | Status: AC | PRN

## 2024-04-21 MED ORDER — KETOROLAC TROMETHAMINE 30 MG/ML IJ SOLN
INTRAMUSCULAR | Status: AC
  Filled 2024-04-21: qty 1

## 2024-04-21 MED ORDER — KETOROLAC TROMETHAMINE 30 MG/ML IJ SOLN
30.0000 mg | Freq: Once | INTRAMUSCULAR | Status: AC
  Administered 2024-04-21: 30 mg via INTRAMUSCULAR

## 2024-04-21 MED ORDER — HYDROCODONE-ACETAMINOPHEN 5-325 MG PO TABS: 1.0000 | ORAL_TABLET | Freq: Four times a day (QID) | ORAL | 0 refills | Status: AC | PRN

## 2024-04-21 NOTE — Discharge Instructions (Addendum)
  1. Kidney stone (suspected) (Primary) - POC Urinalysis Dipstick completed in UC shows large blood, no leukocytes, no nitrite these findings are not strongly indicative of urinary infection but could signify renal calculi. - Urine Culture collected in UC and sent to lab for further testing results should be available in 2 to 3 days - ketorolac  (TORADOL ) 30 MG/ML injection 30 mg given in UC for acute right flank pains possibly secondary to renal calculi. - ibuprofen  (ADVIL ) 800 MG tablet; Take 1 tablet (800 mg total) by mouth every 8 (eight) hours as needed for moderate pain (pain score 4-6).  Dispense: 60 tablet; Refill: 1 - HYDROcodone -acetaminophen  (NORCO/VICODIN) 5-325 MG tablet; Take 1 tablet by mouth every 6 (six) hours as needed for severe pain (pain score 7-10).  Dispense: 10 tablet; Refill: 0  -Continue to monitor symptoms for any change in severity if there is any escalation of current symptoms or development of new symptoms follow-up in ER for further evaluation and management.

## 2024-04-21 NOTE — ED Provider Notes (Signed)
 [facility]  Note:  This document was prepared using Conservation officer, historic buildings and may include unintentional dictation errors.  MRN: [id number] DOB: 14-Feb-1961  Subjective:   Dominique [name] is a 63 y.o. female presenting for evaluation dysuria and right-sided flank pain x 2.  Patient reports overnight pain much more severe and she began vomiting.  Patient denies any nausea at this time still having right flank around the right abdomen.  Patient reports past history of renal calculi but usually in the left side.  Patient reports taking ibuprofen  800 and hydrocodone  for relief last night with mild improvement.  Patient reports that pain has improved but she is almost out of previously prescribed pain medication.  No current facility-administered medications for this encounter.  Current Outpatient Medications:    albuterol  (VENTOLIN  HFA) 108 (90 Base) MCG/ACT inhaler, Inhale 2 puffs into the lungs every 6 (six) hours as needed for wheezing or shortness of breath., Disp: 18 g, Rfl: 5   beclomethasone (QVAR  REDIHALER) 40 MCG/ACT inhaler, Inhale 2 puffs into the lungs 2 (two) times daily., Disp: 1 each, Rfl: 3   Blood Glucose Monitoring Suppl (CONTOUR NEXT ONE) KIT, 1 kit by Does not apply route See admin instructions. For checking blood sugar 2 times a day, Disp: 1 kit, Rfl: 0   Cholecalciferol  (VITAMIN D3 PO), Take 50 mcg by mouth daily., Disp: , Rfl:    Continuous Glucose Sensor (DEXCOM G7 SENSOR) MISC, Use to check glucose continuously, change sensor every 10 days, Disp: 9 each, Rfl: 3   ezetimibe  (ZETIA ) 10 MG tablet, Take 1 tablet (10 mg total) by mouth daily. Needs appt, Disp: 15 tablet, Rfl: 0   fenofibrate  160 MG tablet, 1 po qd, Disp: 90 tablet, Rfl: 3   fluticasone  (FLONASE ) 50 MCG/ACT nasal spray, Place 2 sprays into both nostrils daily. Needs appt, Disp: 16 g, Rfl: 0   fluticasone  (FLOVENT  HFA) 110 MCG/ACT inhaler, Inhale 2 puffs into the lungs 2 (two) times  daily., Disp: 12 g, Rfl: 1   glucose blood (CONTOUR NEXT TEST) test strip, USE TO CHECK BLOOD SUGAR TWICE DAILY, Disp: 200 strip, Rfl: 5   HYDROcodone -acetaminophen  (NORCO/VICODIN) 5-325 MG tablet, Take 1 tablet by mouth every 6 (six) hours as needed for severe pain (pain score 7-10)., Disp: 10 tablet, Rfl: 0   Insulin  Disposable Pump (OMNIPOD 5 G7 PODS, GEN 5,) MISC, 1 each by Does not apply route every other day., Disp: 90 each, Rfl: 3   insulin  glargine (LANTUS  SOLOSTAR) 100 UNIT/ML Solostar Pen, Inject 30 Units into the skin at bedtime., Disp: 15 mL, Rfl: 1   Insulin  Lispro-aabc (LYUMJEV  KWIKPEN) 200 UNIT/ML KwikPen, Inject 10-16 Units into the skin 3 (three) times daily., Disp: 9 mL, Rfl: 3   Insulin  Lispro-aabc (LYUMJEV ) 100 UNIT/ML SOLN, USE UP TO 200 UNITS EVERY OTHER  DAY IN INSULIN  PUMP, Disp: 180 mL, Rfl: 3   Insulin  Pen Needle (BD PEN NEEDLE NANO 2ND GEN) 32G X 4 MM MISC, Inject 1 each into the skin 3 (three) times daily. as directed, Disp: 100 each, Rfl: 1   levalbuterol  (XOPENEX ) 1.25 MG/3ML nebulizer solution, USE ONE VIAL BY NEBULIZATION EVERY 6 HOURS AS NEEDED FOR WHEEZING OR SHORTNESS OF BREATH, Disp: 675 mL, Rfl: 1   losartan  (COZAAR ) 100 MG tablet, Take 1 tablet (100 mg total) by mouth daily., Disp: 90 tablet, Rfl: 3   magnesium 30 MG tablet, Take by mouth daily., Disp: , Rfl:    Microlet Lancets MISC, TEST BLOOD  SUGAR TWICE DAILY, Disp: 200 each, Rfl: 3   montelukast  (SINGULAIR ) 10 MG tablet, Take 1 tablet (10 mg total) by mouth at bedtime., Disp: 90 tablet, Rfl: 1   NONFORMULARY OR COMPOUNDED ITEM, Cmp, lipid--- dx htn and hyperlipidemia Microalbumin---  dx DM II, Disp: 1 each, Rfl: 0   NONFORMULARY OR COMPOUNDED ITEM, Hep C antibody--- need for hep c screen, Disp: 1 each, Rfl: 0   NONFORMULARY OR COMPOUNDED ITEM, Cbcd , cmp, lipid----- dx hyperlipidemia, htn, dm II, Disp: 1 each, Rfl: 0   Omega-3 Fatty Acids (FISH OIL) 1000 MG CAPS, Take by mouth., Disp: , Rfl:    omeprazole   (PRILOSEC) 40 MG capsule, Take 1 capsule (40 mg total) by mouth daily., Disp: 30 capsule, Rfl: 0   Semaglutide , 2 MG/DOSE, (OZEMPIC , 2 MG/DOSE,) 8 MG/3ML SOPN, Inject 2 mg into the skin once a week., Disp: 9 mL, Rfl: 3   Tuberculin-Allergy  Syringes ([name]-D ALLERGY  SYRINGE 1CC/28G) 28G X 1/2 1 ML MISC, Use 3x a day as advised, Disp: 50 each, Rfl: 3   vitamin C  (ASCORBIC ACID ) 500 MG tablet, Take 500 mg by mouth daily., Disp: , Rfl:    ibuprofen  (ADVIL ) 800 MG tablet, Take 1 tablet (800 mg total) by mouth every 8 (eight) hours as needed for moderate pain (pain score 4-6)., Disp: 60 tablet, Rfl: 1   NONFORMULARY OR COMPOUNDED ITEM, Cmp, lipid   Dx hyperlipidemia, Disp: 1 each, Rfl: 0   Allergies  Allergen Reactions   Jardiance  [Empagliflozin ] Other (See Comments)    Persistent yeast infection    Metformin  And Related Diarrhea   Pravastatin  Other (See Comments)    Increased blood glucose?     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   Hypertension    Kidney stone    Kidney stones      Past Surgical History:  Procedure Laterality Date   FOOT ARTHRODESIS Left 09/05/2019   Procedure: Left Modified McBride, Left First and Second Tarsometatarsal Joint Arthrodesis;  Surgeon: Kit Rush, MD;  Location: [facility];  Service: Orthopedics;  Laterality: Left;   laparatomy  1985   RADIAL HEAD ARTHROPLASTY Left 02/23/2013   Procedure: LEFT RADIAL HEAD ARTHROPLASTY qwith ligament reconstruction;  Surgeon: Elsie Mussel, MD;  Location: [facility];  Service: Orthopedics;  Laterality: Left;   TONSILLECTOMY AND ADENOIDECTOMY  1967   uterine lining ablation  2002    Family History  Problem Relation Age of Onset   Diabetes Mother    Hyperlipidemia  Mother    Hypertension Mother    Diabetes Father    Hyperlipidemia Father    Hypertension Father    Sleep apnea Sister     Social History   Tobacco Use   Smoking status: Never   Smokeless tobacco: Never  Substance Use Topics   Alcohol use: No   Drug use: No    ROS Refer to HPI for ROS details.  Objective:    Vitals: BP 120/71 (BP Location: Left Arm)   Pulse 84   Temp 98.9 F (37.2 C) (Oral)   Resp 18   SpO2 96%   Physical Exam Vitals and nursing note reviewed.  Constitutional:  General: She is not in acute distress.    Appearance: Normal appearance. She is well-developed. She is not ill-appearing or toxic-appearing.  HENT:     Head: Normocephalic and atraumatic.  Cardiovascular:     Rate and Rhythm: Normal rate.  Pulmonary:     Effort: Pulmonary effort is normal. No respiratory distress.     Breath sounds: No stridor. No wheezing.  Abdominal:     General: Bowel sounds are normal. There is no distension.     Palpations: Abdomen is soft.     Tenderness: There is no abdominal tenderness. There is right CVA tenderness. There is no left CVA tenderness.  Skin:    General: Skin is warm and dry.  Neurological:     General: No focal deficit present.     Mental Status: She is alert and oriented to person, place, and time.  Psychiatric:        Mood and Affect: Mood normal.        Behavior: Behavior normal.     Procedures  Results for orders placed or performed during the hospital encounter of 04/21/24 (from the past 24 hours)  POC Urinalysis Dipstick     Status: Abnormal   Collection Time: 04/21/24  9:41 AM  Result Value Ref Range   Color, UA yellow yellow   Clarity, UA turbid (A) clear   Glucose, UA negative negative mg/dL   Bilirubin, UA negative negative   Ketones, POC UA negative negative mg/dL   Spec Grav, UA >=8.969 (A) 1.010 - 1.025   Blood, UA large (A) negative   pH, UA 5.5 5.0 - 8.0   POC PROTEIN,UA =30 (A) negative, trace   Urobilinogen,  UA 0.2 0.2 or 1.0 E.U./dL   Nitrite, UA Negative Negative   Leukocytes, UA Negative Negative    Assessment and Plan :     Discharge Instructions       1. Kidney stone (suspected) (Primary) - POC Urinalysis Dipstick completed in UC shows large blood, no leukocytes, no nitrite these findings are not strongly indicative of urinary infection but could signify renal calculi. - Urine Culture collected in UC and sent to lab for further testing results should be available in 2 to 3 days - ketorolac  (TORADOL ) 30 MG/ML injection 30 mg given in UC for acute right flank pains possibly secondary to renal calculi. - ibuprofen  (ADVIL ) 800 MG tablet; Take 1 tablet (800 mg total) by mouth every 8 (eight) hours as needed for moderate pain (pain score 4-6).  Dispense: 60 tablet; Refill: 1 - HYDROcodone -acetaminophen  (NORCO/VICODIN) 5-325 MG tablet; Take 1 tablet by mouth every 6 (six) hours as needed for severe pain (pain score 7-10).  Dispense: 10 tablet; Refill: 0  -Continue to monitor symptoms for any change in severity if there is any escalation of current symptoms or development of new symptoms follow-up in ER for further evaluation and management.      Dominique [name]   Dominique [name], Dominique B, NP 04/21/24 1001

## 2024-04-21 NOTE — ED Triage Notes (Signed)
 Patient presents to the office for Dysuria and right side flank pain X 2 days. Patient took Ibu and Hydrocodone  for relief last night.

## 2024-04-22 ENCOUNTER — Ambulatory Visit (HOSPITAL_COMMUNITY): Payer: Self-pay

## 2024-04-22 ENCOUNTER — Ambulatory Visit: Admitting: Family Medicine

## 2024-04-22 LAB — URINE CULTURE
Culture: <10000 — AB
Special Requests: NORMAL

## 2024-04-26 ENCOUNTER — Encounter: Payer: Self-pay | Admitting: Family Medicine

## 2024-04-26 ENCOUNTER — Ambulatory Visit: Admitting: Family Medicine

## 2024-04-26 VITALS — BP 130/70 | HR 89 | Temp 98.7°F | Resp 18 | Ht 63.0 in | Wt 162.2 lb

## 2024-04-26 DIAGNOSIS — Z1231 Encounter for screening mammogram for malignant neoplasm of breast: Secondary | ICD-10-CM

## 2024-04-26 DIAGNOSIS — E2839 Other primary ovarian failure: Secondary | ICD-10-CM

## 2024-04-26 DIAGNOSIS — J302 Other seasonal allergic rhinitis: Secondary | ICD-10-CM

## 2024-04-26 DIAGNOSIS — J45909 Unspecified asthma, uncomplicated: Secondary | ICD-10-CM

## 2024-04-26 DIAGNOSIS — I10 Essential (primary) hypertension: Secondary | ICD-10-CM

## 2024-04-26 DIAGNOSIS — K219 Gastro-esophageal reflux disease without esophagitis: Secondary | ICD-10-CM

## 2024-04-26 DIAGNOSIS — E785 Hyperlipidemia, unspecified: Secondary | ICD-10-CM

## 2024-04-26 DIAGNOSIS — Z23 Encounter for immunization: Secondary | ICD-10-CM

## 2024-04-26 DIAGNOSIS — J4520 Mild intermittent asthma, uncomplicated: Secondary | ICD-10-CM

## 2024-04-26 MED ORDER — FENOFIBRATE 160 MG PO TABS: ORAL_TABLET | ORAL | 3 refills | Status: AC

## 2024-04-26 MED ORDER — ALBUTEROL SULFATE HFA 108 (90 BASE) MCG/ACT IN AERS: 2.0000 | INHALATION_SPRAY | Freq: Four times a day (QID) | RESPIRATORY_TRACT | 5 refills | Status: AC | PRN

## 2024-04-26 MED ORDER — FLUTICASONE PROPIONATE HFA 110 MCG/ACT IN AERO: 2.0000 | INHALATION_SPRAY | Freq: Two times a day (BID) | RESPIRATORY_TRACT | 1 refills | Status: AC

## 2024-04-26 MED ORDER — FLUTICASONE PROPIONATE 50 MCG/ACT NA SUSP: 2.0000 | Freq: Every day | NASAL | 0 refills | Status: AC

## 2024-04-26 MED ORDER — LEVALBUTEROL HCL 1.25 MG/3ML IN NEBU: INHALATION_SOLUTION | RESPIRATORY_TRACT | 1 refills | Status: AC

## 2024-04-26 MED ORDER — LOSARTAN POTASSIUM 100 MG PO TABS: 100.0000 mg | ORAL_TABLET | Freq: Every day | ORAL | 3 refills | Status: AC

## 2024-04-26 MED ORDER — EZETIMIBE 10 MG PO TABS: 10.0000 mg | ORAL_TABLET | Freq: Every day | ORAL | 0 refills | Status: AC

## 2024-04-26 MED ORDER — MONTELUKAST SODIUM 10 MG PO TABS: 10.0000 mg | ORAL_TABLET | Freq: Every day | ORAL | 1 refills | Status: AC

## 2024-04-26 MED ORDER — OMEPRAZOLE 40 MG PO CPDR: 40.0000 mg | DELAYED_RELEASE_CAPSULE | Freq: Every day | ORAL | 0 refills | Status: AC

## 2024-04-26 NOTE — Progress Notes (Unsigned)
 Subjective:    Patient ID: Dominique [name], female    DOB: [birth year], 63 y.o.   MRN: [id number]  Chief Complaint  Patient presents with   UC follow up    HPI Patient is in today for f/u uc.   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   Hypertension    Kidney stone    Kidney stones     Past Surgical History:  Procedure Laterality Date   FOOT ARTHRODESIS Left 09/05/2019   Procedure: Left Modified McBride, Left First and Second Tarsometatarsal Joint Arthrodesis;  Surgeon: Kit Rush, MD;  Location: [facility];  Service: Orthopedics;  Laterality: Left;   laparatomy  1985   RADIAL HEAD ARTHROPLASTY Left 02/23/2013   Procedure: LEFT RADIAL HEAD ARTHROPLASTY qwith ligament reconstruction;  Surgeon: Elsie Mussel, MD;  Location: [facility];  Service: Orthopedics;  Laterality: Left;   TONSILLECTOMY AND ADENOIDECTOMY  1967   uterine lining ablation  2002    Family History  Problem Relation Age of Onset   Diabetes Mother    Hyperlipidemia Mother    Hypertension Mother    Diabetes Father    Hyperlipidemia Father    Hypertension Father    Sleep apnea Sister     Social History   Socioeconomic History   Marital status: Married    Spouse name: Not on file   Number of children: Not on file   Years of education: Not on file   Highest education level: 12th grade  Occupational History    Comment: unemployed  Tobacco Use   Smoking status: Never   Smokeless tobacco: Never  Substance and Sexual Activity   Alcohol use: No   Drug use: No   Sexual activity: Yes    Partners: Male  Other Topics Concern   Not on file  Social History Narrative   Exercise-- no   Social Drivers of  Health   Tobacco Use: Low Risk (04/26/2024)   Patient History    Smoking Tobacco Use: Never    Smokeless Tobacco Use: Never    Passive Exposure: Not on file  Financial Resource Strain: Low Risk (04/22/2024)   Overall Financial Resource Strain (CARDIA)    Difficulty of Paying Living Expenses: Not hard at all  Food Insecurity: No Food Insecurity (04/22/2024)   Epic    Worried About Radiation Protection Practitioner of Food in the Last Year: Never true    Ran Out of Food in the Last Year: Never true  Transportation Needs: No Transportation Needs (04/22/2024)   Epic    Lack of Transportation (Medical): No    Lack of Transportation (Non-Medical): No  Physical Activity: Insufficiently Active (04/22/2024)   Exercise Vital Sign    Days of Exercise per Week: 1 day    Minutes of Exercise per Session: 10 min  Stress: No Stress Concern Present (04/22/2024)   [name] of Occupational Health - Occupational Stress Questionnaire    Feeling of Stress: Not at all  Social Connections: Socially Integrated (04/22/2024)   Social Connection and Isolation Panel    Frequency of Communication with Friends and Family: More than three times  a week    Frequency of Social Gatherings with Friends and Family: Patient declined    Attends Religious Services: More than 4 times per year    Active Member of [employer] or Organizations: Yes    Attends Engineer, Structural: More than 4 times per year    Marital Status: Married  Catering Manager Violence: Not on file  Depression (PHQ2-9): Low Risk (04/26/2024)   Depression (PHQ2-9)    PHQ-2 Score: 0  Alcohol Screen: Not on file  Housing: Low Risk (04/22/2024)   Epic    Unable to Pay for Housing in the Last Year: No    Number of Times Moved in the Last Year: 0    Homeless in the Last Year: No  Utilities: Not on file  Health Literacy: Not on file    Outpatient Medications Prior to Visit  Medication Sig Dispense Refill   beclomethasone (QVAR  REDIHALER) 40 MCG/ACT inhaler  Inhale 2 puffs into the lungs 2 (two) times daily. 1 each 3   Blood Glucose Monitoring Suppl (CONTOUR NEXT ONE) KIT 1 kit by Does not apply route See admin instructions. For checking blood sugar 2 times a day 1 kit 0   Cholecalciferol  (VITAMIN D3 PO) Take 50 mcg by mouth daily.     Continuous Glucose Sensor (DEXCOM G7 SENSOR) MISC Use to check glucose continuously, change sensor every 10 days 9 each 3   glucose blood (CONTOUR NEXT TEST) test strip USE TO CHECK BLOOD SUGAR TWICE DAILY 200 strip 5   HYDROcodone -acetaminophen  (NORCO/VICODIN) 5-325 MG tablet Take 1 tablet by mouth every 6 (six) hours as needed for severe pain (pain score 7-10). 10 tablet 0   ibuprofen  (ADVIL ) 800 MG tablet Take 1 tablet (800 mg total) by mouth every 8 (eight) hours as needed for moderate pain (pain score 4-6). 60 tablet 1   Insulin  Disposable Pump (OMNIPOD 5 G7 PODS, GEN 5,) MISC 1 each by Does not apply route every other day. 90 each 3   insulin  glargine (LANTUS  SOLOSTAR) 100 UNIT/ML Solostar Pen Inject 30 Units into the skin at bedtime. 15 mL 1   Insulin  Lispro-aabc (LYUMJEV  KWIKPEN) 200 UNIT/ML KwikPen Inject 10-16 Units into the skin 3 (three) times daily. 9 mL 3   Insulin  Lispro-aabc (LYUMJEV ) 100 UNIT/ML SOLN USE UP TO 200 UNITS EVERY OTHER  DAY IN INSULIN  PUMP 180 mL 3   Insulin  Pen Needle (BD PEN NEEDLE NANO 2ND GEN) 32G X 4 MM MISC Inject 1 each into the skin 3 (three) times daily. as directed 100 each 1   magnesium 30 MG tablet Take by mouth daily.     Microlet Lancets MISC TEST BLOOD SUGAR TWICE DAILY 200 each 3   NONFORMULARY OR COMPOUNDED ITEM Cmp, lipid   Dx hyperlipidemia 1 each 0   NONFORMULARY OR COMPOUNDED ITEM Cmp, lipid--- dx htn and hyperlipidemia Microalbumin---  dx DM II 1 each 0   NONFORMULARY OR COMPOUNDED ITEM Hep C antibody--- need for hep c screen 1 each 0   NONFORMULARY OR COMPOUNDED ITEM Cbcd , cmp, lipid----- dx hyperlipidemia, htn, dm II 1 each 0   Omega-3 Fatty Acids (FISH OIL) 1000 MG  CAPS Take by mouth.     Semaglutide , 2 MG/DOSE, (OZEMPIC , 2 MG/DOSE,) 8 MG/3ML SOPN Inject 2 mg into the skin once a week. 9 mL 3   Tuberculin-Allergy  Syringes (B-D ALLERGY  SYRINGE 1CC/28G) 28G X 1/2 1 ML MISC Use 3x a day as advised 50 each 3   vitamin C  (  ASCORBIC ACID ) 500 MG tablet Take 500 mg by mouth daily.     albuterol  (VENTOLIN  HFA) 108 (90 Base) MCG/ACT inhaler Inhale 2 puffs into the lungs every 6 (six) hours as needed for wheezing or shortness of breath. 18 g 5   ezetimibe  (ZETIA ) 10 MG tablet Take 1 tablet (10 mg total) by mouth daily. Needs appt 15 tablet 0   fenofibrate  160 MG tablet 1 po qd 90 tablet 3   fluticasone  (FLONASE ) 50 MCG/ACT nasal spray Place 2 sprays into both nostrils daily. Needs appt 16 g 0   fluticasone  (FLOVENT  HFA) 110 MCG/ACT inhaler Inhale 2 puffs into the lungs 2 (two) times daily. 12 g 1   levalbuterol  (XOPENEX ) 1.25 MG/3ML nebulizer solution USE ONE VIAL BY NEBULIZATION EVERY 6 HOURS AS NEEDED FOR WHEEZING OR SHORTNESS OF BREATH 675 mL 1   losartan  (COZAAR ) 100 MG tablet Take 1 tablet (100 mg total) by mouth daily. 90 tablet 3   montelukast  (SINGULAIR ) 10 MG tablet Take 1 tablet (10 mg total) by mouth at bedtime. 90 tablet 1   omeprazole  (PRILOSEC) 40 MG capsule Take 1 capsule (40 mg total) by mouth daily. 30 capsule 0   No facility-administered medications prior to visit.    Allergies[1]  Review of Systems  Constitutional:  Negative for fever and malaise/fatigue.  HENT:  Negative for congestion.   Eyes:  Negative for blurred vision.  Respiratory:  Negative for shortness of breath.   Cardiovascular:  Negative for chest pain, palpitations and leg swelling.  Gastrointestinal:  Negative for abdominal pain, blood in stool and nausea.  Genitourinary:  Negative for dysuria and frequency.  Musculoskeletal:  Negative for falls.  Skin:  Negative for rash.  Neurological:  Negative for dizziness, loss of consciousness and headaches.  Endo/Heme/Allergies:   Negative for environmental allergies.  Psychiatric/Behavioral:  Negative for depression. The patient is not nervous/anxious.        Objective:    Physical Exam Vitals and nursing note reviewed.  Constitutional:      General: She is not in acute distress.    Appearance: Normal appearance. She is well-developed.  HENT:     Head: Normocephalic and atraumatic.     Right Ear: Tympanic membrane, ear canal and external ear normal. There is no impacted cerumen.     Left Ear: Tympanic membrane, ear canal and external ear normal. There is no impacted cerumen.     Nose: Nose normal.     Mouth/Throat:     Mouth: Mucous membranes are moist.     Pharynx: Oropharynx is clear. No oropharyngeal exudate or posterior oropharyngeal erythema.  Eyes:     General: No scleral icterus.       Right eye: No discharge.        Left eye: No discharge.     Conjunctiva/sclera: Conjunctivae normal.     Pupils: Pupils are equal, round, and reactive to light.  Neck:     Thyroid : No thyromegaly or thyroid  tenderness.     Vascular: No JVD.  Cardiovascular:     Rate and Rhythm: Normal rate and regular rhythm.     Heart sounds: Normal heart sounds. No murmur heard. Pulmonary:     Effort: Pulmonary effort is normal. No respiratory distress.     Breath sounds: Normal breath sounds.  Abdominal:     General: Bowel sounds are normal. There is no distension.     Palpations: Abdomen is soft. There is no mass.     Tenderness: There is  no abdominal tenderness. There is no guarding or rebound.  Genitourinary:    Vagina: Normal.  Musculoskeletal:        General: Normal range of motion.     Cervical back: Normal range of motion and neck supple.     Right lower leg: No edema.     Left lower leg: No edema.  Lymphadenopathy:     Cervical: No cervical adenopathy.  Skin:    General: Skin is warm and dry.     Findings: No erythema or rash.  Neurological:     Mental Status: She is alert and oriented to person, place, and  time.     Cranial Nerves: No cranial nerve deficit.     Deep Tendon Reflexes: Reflexes are normal and symmetric.  Psychiatric:        Mood and Affect: Mood normal.        Behavior: Behavior normal.        Thought Content: Thought content normal.        Judgment: Judgment normal.     BP 130/70 (BP Location: Right Arm, Patient Position: Sitting)   Pulse 89   Temp 98.7 F (37.1 C) (Oral)   Resp 18   Ht 5' 3 (1.6 m)   Wt 162 lb 3.2 oz (73.6 kg)   SpO2 97%   BMI 28.73 kg/m  Wt Readings from Last 3 Encounters:  04/26/24 162 lb 3.2 oz (73.6 kg)  03/22/24 163 lb (73.9 kg)  03/20/24 163 lb 12.8 oz (74.3 kg)    Diabetic Foot Exam - Simple   No data filed    Lab Results  Component Value Date   WBC 5.4 03/10/2022   HGB 15.0 03/10/2022   HCT 45.2 03/10/2022   PLT 157 03/10/2022   GLUCOSE 216 (H) 04/25/2023   CHOL 136 10/27/2021   TRIG 102 10/27/2021   HDL 48 10/27/2021   LDLCALC 69 10/27/2021   ALT 40 (H) 04/25/2023   AST 33 04/25/2023   NA 141 04/25/2023   K 4.5 04/25/2023   CL 104 04/25/2023   CREATININE 0.80 04/25/2023   BUN 11 04/25/2023   CO2 22 04/25/2023   TSH 1.650 03/10/2022   HGBA1C 6.3 (A) 03/20/2024    Lab Results  Component Value Date   TSH 1.650 03/10/2022   Lab Results  Component Value Date   WBC 5.4 03/10/2022   HGB 15.0 03/10/2022   HCT 45.2 03/10/2022   MCV 89 03/10/2022   PLT 157 03/10/2022   Lab Results  Component Value Date   NA 141 04/25/2023   K 4.5 04/25/2023   CO2 22 04/25/2023   GLUCOSE 216 (H) 04/25/2023   BUN 11 04/25/2023   CREATININE 0.80 04/25/2023   BILITOT 0.6 04/25/2023   ALKPHOS 115 04/25/2023   AST 33 04/25/2023   ALT 40 (H) 04/25/2023   PROT 6.3 04/25/2023   ALBUMIN 4.4 04/25/2023   CALCIUM  9.7 04/25/2023   EGFR 82 04/25/2023   Lab Results  Component Value Date   CHOL 136 10/27/2021   Lab Results  Component Value Date   HDL 48 10/27/2021   Lab Results  Component Value Date   LDLCALC 69 10/27/2021    Lab Results  Component Value Date   TRIG 102 10/27/2021   Lab Results  Component Value Date   CHOLHDL 3.1 11/19/2020   Lab Results  Component Value Date   HGBA1C 6.3 (A) 03/20/2024       Assessment & Plan:  Screening mammogram  for breast cancer -     MM 3D DIAGNOSTIC MAMMOGRAM BILATERAL BREAST; Future  Mild intermittent asthma, unspecified whether complicated -     Albuterol  Sulfate HFA; Inhale 2 puffs into the lungs every 6 (six) hours as needed for wheezing or shortness of breath.  Dispense: 18 g; Refill: 5  Hyperlipidemia, unspecified hyperlipidemia type -     Ezetimibe ; Take 1 tablet (10 mg total) by mouth daily. Needs appt  Dispense: 15 tablet; Refill: 0 -     Fenofibrate ; 1 po qd  Dispense: 90 tablet; Refill: 3  Seasonal allergies -     Fluticasone  Propionate; Place 2 sprays into both nostrils daily. Needs appt  Dispense: 16 g; Refill: 0 -     Fluticasone  Propionate HFA; Inhale 2 puffs into the lungs 2 (two) times daily.  Dispense: 12 g; Refill: 1 -     Montelukast  Sodium; Take 1 tablet (10 mg total) by mouth at bedtime.  Dispense: 90 tablet; Refill: 1  Asthma, chronic, unspecified asthma severity, uncomplicated -     Levalbuterol  HCl; USE ONE VIAL BY NEBULIZATION EVERY 6 HOURS AS NEEDED FOR WHEEZING OR SHORTNESS OF BREATH  Dispense: 675 mL; Refill: 1  Essential hypertension -     Losartan  Potassium; Take 1 tablet (100 mg total) by mouth daily.  Dispense: 90 tablet; Refill: 3  Gastroesophageal reflux disease, unspecified whether esophagitis present -     Omeprazole ; Take 1 capsule (40 mg total) by mouth daily.  Dispense: 30 capsule; Refill: 0  Estrogen deficiency -     DG Bone Density; Future  Need for influenza vaccination -     Flu vaccine HIGH DOSE PF(Fluzone Trivalent)    Tynika Luddy R Lowne Chase, DO     [1]  Allergies Allergen Reactions   Jardiance  [Empagliflozin ] Other (See Comments)    Persistent yeast infection    Metformin  And Related Diarrhea    Pravastatin  Other (See Comments)    Increased blood glucose?

## 2024-04-27 LAB — CBC WITH DIFFERENTIAL/PLATELET
Basophils Absolute: 0 x10E3/uL (ref 0.0–0.2)
Basos: 0 %
EOS (ABSOLUTE): 0.1 x10E3/uL (ref 0.0–0.4)
Eos: 1 %
Hematocrit: 45 % (ref 34.0–46.6)
Hemoglobin: 14.7 g/dL (ref 11.1–15.9)
Immature Grans (Abs): 0 x10E3/uL (ref 0.0–0.1)
Immature Granulocytes: 0 %
Lymphocytes Absolute: 2 x10E3/uL (ref 0.7–3.1)
Lymphs: 30 %
MCH: 30.5 pg (ref 26.6–33.0)
MCHC: 32.7 g/dL (ref 31.5–35.7)
MCV: 93 fL (ref 79–97)
Monocytes Absolute: 0.5 x10E3/uL (ref 0.1–0.9)
Monocytes: 8 %
Neutrophils Absolute: 4 x10E3/uL (ref 1.4–7.0)
Neutrophils: 61 %
Platelets: 164 x10E3/uL (ref 150–450)
RBC: 4.82 x10E6/uL (ref 3.77–5.28)
RDW: 12.6 % (ref 11.7–15.4)
WBC: 6.6 x10E3/uL (ref 3.4–10.8)

## 2024-04-27 LAB — COMPREHENSIVE METABOLIC PANEL WITH GFR
ALT: 34 IU/L — ABNORMAL HIGH (ref 0–32)
AST: 35 IU/L (ref 0–40)
Albumin: 4.4 g/dL (ref 3.9–4.9)
Alkaline Phosphatase: 103 IU/L (ref 49–135)
BUN/Creatinine Ratio: 16 (ref 12–28)
BUN: 15 mg/dL (ref 8–27)
Bilirubin Total: 0.7 mg/dL (ref 0.0–1.2)
CO2: 25 mmol/L (ref 20–29)
Calcium: 10.1 mg/dL (ref 8.7–10.3)
Chloride: 103 mmol/L (ref 96–106)
Creatinine, Ser: 0.91 mg/dL (ref 0.57–1.00)
Globulin, Total: 2.1 g/dL (ref 1.5–4.5)
Glucose: 131 mg/dL — ABNORMAL HIGH (ref 70–99)
Potassium: 4.2 mmol/L (ref 3.5–5.2)
Sodium: 145 mmol/L — ABNORMAL HIGH (ref 134–144)
Total Protein: 6.5 g/dL (ref 6.0–8.5)
eGFR: 70 mL/min/1.73 (ref >59)

## 2024-04-27 LAB — LIPID PANEL
Chol/HDL Ratio: 3 ratio (ref 0.0–4.4)
Cholesterol, Total: 150 mg/dL (ref 100–199)
HDL: 50 mg/dL (ref >39)
LDL Chol Calc (NIH): 70 mg/dL (ref 0–99)
Triglycerides: 176 mg/dL — ABNORMAL HIGH (ref 0–149)
VLDL Cholesterol Cal: 30 mg/dL (ref 5–40)

## 2024-04-27 LAB — TSH: TSH: 1.6 u[IU]/mL (ref 0.450–4.500)

## 2024-04-28 ENCOUNTER — Ambulatory Visit: Payer: Self-pay | Admitting: Family Medicine

## 2024-04-29 ENCOUNTER — Telehealth: Payer: Self-pay

## 2024-04-29 ENCOUNTER — Other Ambulatory Visit (HOSPITAL_COMMUNITY): Payer: Self-pay

## 2024-04-29 ENCOUNTER — Encounter: Payer: Self-pay | Admitting: Family Medicine

## 2024-04-29 NOTE — Telephone Encounter (Signed)
 Pharmacy Patient Advocate Encounter   Received notification from Pt Calls Messages that prior authorization for Fluticasone  110 HFA inhaler is required/requested.   Insurance verification completed.   The patient is insured through [facility] [employer].   Per test claim: PA required; PA submitted to above mentioned insurance via Latent Key/confirmation #/EOC [id number] Status is pending

## 2024-04-29 NOTE — Telephone Encounter (Signed)
 Needs PA for fluticasone  110mcg inhaler please

## 2024-05-01 ENCOUNTER — Encounter: Payer: Self-pay | Admitting: Neurology

## 2024-05-03 NOTE — Telephone Encounter (Signed)
 Pharmacy Patient Advocate Encounter  Received notification from [employer] that Prior Authorization for Fluticasone  HFA 110mcg has been DENIED.  See denial reason below. No denial letter attached in CMM. Will attach denial letter to Media tab once received.   PA #/Case ID/Reference #: [id number]

## 2024-05-06 ENCOUNTER — Encounter: Payer: Self-pay | Admitting: Family Medicine

## 2024-05-07 ENCOUNTER — Other Ambulatory Visit (HOSPITAL_COMMUNITY): Payer: Self-pay

## 2024-05-07 ENCOUNTER — Encounter: Payer: Self-pay | Admitting: Family Medicine

## 2024-05-08 ENCOUNTER — Encounter: Payer: Self-pay | Admitting: Family Medicine

## 2024-05-08 NOTE — Telephone Encounter (Signed)
 I'm not seeing notes about a new nebulizer machine RX, please advise.

## 2024-05-10 ENCOUNTER — Other Ambulatory Visit: Payer: Self-pay | Admitting: Family

## 2024-05-10 DIAGNOSIS — J4520 Mild intermittent asthma, uncomplicated: Secondary | ICD-10-CM

## 2024-05-13 MED ORDER — QVAR REDIHALER 40 MCG/ACT IN AERB: 2.0000 | INHALATION_SPRAY | Freq: Two times a day (BID) | RESPIRATORY_TRACT | 2 refills | Status: AC

## 2024-05-18 ENCOUNTER — Encounter: Payer: Self-pay | Admitting: Internal Medicine

## 2024-05-20 MED ORDER — DEXCOM G7 SENSOR MISC: 3 refills | Status: AC

## 2024-07-22 ENCOUNTER — Ambulatory Visit: Admitting: Internal Medicine

## 2024-08-14 ENCOUNTER — Ambulatory Visit: Admitting: Podiatry

## 2025-01-22 ENCOUNTER — Telehealth: Admitting: Neurology
